# Patient Record
Sex: Male | Born: 1940 | Race: Black or African American | Hispanic: No | Marital: Married | Smoking: Former smoker
Health system: Southern US, Community
[De-identification: ages and names within clinical notes are randomized; demographics above are authoritative.]

## PROBLEM LIST (undated history)

## (undated) DIAGNOSIS — J40 Bronchitis, not specified as acute or chronic: Secondary | ICD-10-CM

## (undated) DIAGNOSIS — K5790 Diverticulosis of intestine, part unspecified, without perforation or abscess without bleeding: Secondary | ICD-10-CM

## (undated) DIAGNOSIS — E7800 Pure hypercholesterolemia, unspecified: Secondary | ICD-10-CM

## (undated) DIAGNOSIS — B1920 Unspecified viral hepatitis C without hepatic coma: Secondary | ICD-10-CM

## (undated) DIAGNOSIS — K635 Polyp of colon: Secondary | ICD-10-CM

## (undated) DIAGNOSIS — J449 Chronic obstructive pulmonary disease, unspecified: Secondary | ICD-10-CM

## (undated) DIAGNOSIS — I10 Essential (primary) hypertension: Secondary | ICD-10-CM

## (undated) DIAGNOSIS — E119 Type 2 diabetes mellitus without complications: Secondary | ICD-10-CM

## (undated) DIAGNOSIS — N321 Vesicointestinal fistula: Secondary | ICD-10-CM

## (undated) DIAGNOSIS — K219 Gastro-esophageal reflux disease without esophagitis: Secondary | ICD-10-CM

## (undated) HISTORY — PX: INCISION AND DRAINAGE OF WOUND: SHX1803

## (undated) HISTORY — DX: Unspecified viral hepatitis C without hepatic coma: B19.20

## (undated) HISTORY — DX: Essential (primary) hypertension: I10

## (undated) HISTORY — DX: Diverticulosis of intestine, part unspecified, without perforation or abscess without bleeding: K57.90

## (undated) HISTORY — PX: COLONOSCOPY: SHX174

## (undated) HISTORY — PX: TONSILLECTOMY: SUR1361

## (undated) HISTORY — DX: Vesicointestinal fistula: N32.1

## (undated) HISTORY — PX: POLYPECTOMY: SHX149

## (undated) HISTORY — DX: Polyp of colon: K63.5

## (undated) HISTORY — PX: LIVER BIOPSY: SHX301

## (undated) HISTORY — DX: Bronchitis, not specified as acute or chronic: J40

---

## 1969-04-15 HISTORY — PX: INCISION AND DRAINAGE OF WOUND: SHX1803

## 1998-09-29 ENCOUNTER — Ambulatory Visit (HOSPITAL_COMMUNITY): Admission: RE | Admit: 1998-09-29 | Discharge: 1998-09-29 | Payer: Self-pay | Admitting: Hematology and Oncology

## 1998-09-29 ENCOUNTER — Encounter: Admission: RE | Admit: 1998-09-29 | Discharge: 1998-09-29 | Payer: Self-pay | Admitting: Hematology and Oncology

## 1998-09-29 ENCOUNTER — Encounter: Payer: Self-pay | Admitting: Hematology and Oncology

## 1999-03-07 ENCOUNTER — Emergency Department (HOSPITAL_COMMUNITY): Admission: EM | Admit: 1999-03-07 | Discharge: 1999-03-07 | Payer: Self-pay

## 1999-03-17 ENCOUNTER — Encounter (INDEPENDENT_AMBULATORY_CARE_PROVIDER_SITE_OTHER): Payer: Self-pay | Admitting: Specialist

## 1999-03-17 ENCOUNTER — Ambulatory Visit (HOSPITAL_COMMUNITY): Admission: RE | Admit: 1999-03-17 | Discharge: 1999-03-17 | Payer: Self-pay | Admitting: Gastroenterology

## 2000-11-21 ENCOUNTER — Ambulatory Visit (HOSPITAL_COMMUNITY): Admission: RE | Admit: 2000-11-21 | Discharge: 2000-11-21 | Payer: Self-pay | Admitting: Internal Medicine

## 2000-11-21 ENCOUNTER — Encounter: Payer: Self-pay | Admitting: Internal Medicine

## 2000-11-29 ENCOUNTER — Ambulatory Visit (HOSPITAL_COMMUNITY): Admission: RE | Admit: 2000-11-29 | Discharge: 2000-11-29 | Payer: Self-pay | Admitting: Internal Medicine

## 2001-06-21 ENCOUNTER — Encounter: Payer: Self-pay | Admitting: Endocrinology

## 2001-06-21 ENCOUNTER — Ambulatory Visit (HOSPITAL_COMMUNITY): Admission: RE | Admit: 2001-06-21 | Discharge: 2001-06-21 | Payer: Self-pay | Admitting: Endocrinology

## 2002-05-23 ENCOUNTER — Encounter: Payer: Self-pay | Admitting: Internal Medicine

## 2002-05-23 ENCOUNTER — Encounter: Admission: RE | Admit: 2002-05-23 | Discharge: 2002-05-23 | Payer: Self-pay | Admitting: Internal Medicine

## 2002-05-24 ENCOUNTER — Encounter: Payer: Self-pay | Admitting: Internal Medicine

## 2002-05-24 ENCOUNTER — Encounter: Admission: RE | Admit: 2002-05-24 | Discharge: 2002-05-24 | Payer: Self-pay | Admitting: Internal Medicine

## 2005-04-04 ENCOUNTER — Emergency Department (HOSPITAL_COMMUNITY): Admission: EM | Admit: 2005-04-04 | Discharge: 2005-04-04 | Payer: Self-pay | Admitting: Emergency Medicine

## 2005-05-06 ENCOUNTER — Ambulatory Visit: Payer: Self-pay | Admitting: Family Medicine

## 2005-07-23 ENCOUNTER — Emergency Department (HOSPITAL_COMMUNITY): Admission: EM | Admit: 2005-07-23 | Discharge: 2005-07-24 | Payer: Self-pay | Admitting: Emergency Medicine

## 2006-01-02 ENCOUNTER — Encounter: Payer: Self-pay | Admitting: Emergency Medicine

## 2006-01-03 ENCOUNTER — Observation Stay (HOSPITAL_COMMUNITY): Admission: EM | Admit: 2006-01-03 | Discharge: 2006-01-04 | Payer: Self-pay | Admitting: Internal Medicine

## 2006-01-31 ENCOUNTER — Ambulatory Visit: Payer: Self-pay | Admitting: Sports Medicine

## 2007-04-29 ENCOUNTER — Emergency Department (HOSPITAL_COMMUNITY): Admission: EM | Admit: 2007-04-29 | Discharge: 2007-04-30 | Payer: Self-pay | Admitting: Emergency Medicine

## 2007-12-10 ENCOUNTER — Ambulatory Visit: Payer: Self-pay | Admitting: Family Medicine

## 2007-12-10 DIAGNOSIS — F17200 Nicotine dependence, unspecified, uncomplicated: Secondary | ICD-10-CM

## 2007-12-10 DIAGNOSIS — G5710 Meralgia paresthetica, unspecified lower limb: Secondary | ICD-10-CM

## 2007-12-11 ENCOUNTER — Encounter (INDEPENDENT_AMBULATORY_CARE_PROVIDER_SITE_OTHER): Payer: Self-pay | Admitting: Family Medicine

## 2008-05-17 ENCOUNTER — Emergency Department (HOSPITAL_COMMUNITY): Admission: EM | Admit: 2008-05-17 | Discharge: 2008-05-17 | Payer: Self-pay | Admitting: Emergency Medicine

## 2008-05-20 ENCOUNTER — Ambulatory Visit (HOSPITAL_COMMUNITY): Admission: RE | Admit: 2008-05-20 | Discharge: 2008-05-20 | Payer: Self-pay | Admitting: Family Medicine

## 2010-06-25 ENCOUNTER — Encounter: Admission: RE | Admit: 2010-06-25 | Discharge: 2010-06-25 | Payer: Self-pay | Admitting: Emergency Medicine

## 2010-12-31 NOTE — H&P (Signed)
NAMEGUERIN, LASHOMB NO.:  [id number]   MEDICAL RECORD NO.:  [id number]          PATIENT TYPE:  EMS   LOCATION:  ED                           FACILITY:  [facility]   PHYSICIAN:  Hollice Espy, M.D.DATE OF BIRTH:  1941-08-03   DATE OF ADMISSION:  01/02/2006  DATE OF DISCHARGE:                                HISTORY & PHYSICAL   ATTENDING PHYSICIAN:  Deirdre Peer. Polite, M.D.  The patient has no primary  care physician.   CHIEF COMPLAINT:  Chest pain.   HISTORY OF PRESENT ILLNESS:  The patient is a 70 year old African American  male with no past medical history but has not really ever seen a physician  who presents to the emergency room after an episode of chest pain.  He,  according to his wife, has had previous episodes of chest pain and  increasing dyspnea on exertion but has not really talked to a doctor about  this.  Today he was driving in his truck and all of a sudden started having  left-sided chest pain.  He described it as a heavy pressure that radiated  from the right side of his chest to the left side of his chest.  There was  also some radiation up to his jaw.  There was no associated shortness of  breath. His symptoms lasted for about an hour and started to wane on their  own.  By the time he came into the emergency room, it had nearly resolved.  EKG was done which showed essentially normal sinus rhythm with some vague  nonspecific T wave abnormalities with flipped T waves in leads III and aVF  and flattened in lead II.  Cardiac enzymes were drawn and found to be  unremarkable.  The rest of the patient's labs were unremarkable as well.  Currently the patient is feeling okay.  He has no complaints.  He denies any  headaches, vision changes, chest pain, palpitations, shortness of breath,  wheeze, cough, abdominal pain, hematuria, dysuria, constipation, diarrhea,  focal extremity numbness, weakness or pain.  His review of systems is  otherwise  negative.   PAST MEDICAL HISTORY:  None but again, he has not seen a physician.   MEDICATIONS:  None.   ALLERGIES:  None.   SOCIAL HISTORY:  He smokes a pack a day.  Denies any drugs or alcohol use.   FAMILY HISTORY:  Noted for a mom who had an MI in her 60's.   PHYSICAL EXAMINATION:  VITAL SIGNS:  Temperature 99.7, heart rate 81, blood  pressure 136/88, respirations, 28, saturation 99% on room air.  GENERAL:  The patient is alert and oriented x3, in no apparent distress.  HEENT:  Normocephalic, atraumatic.  Mucous membranes are moist.  He has no  carotid bruits.  HEART:  Regular rate and rhythm.  S1 and S2.  LUNGS:  Clear to auscultation bilaterally.  ABDOMEN:  Soft, nontender, nondistended.  Positive bowel sounds.  EXTREMITIES:  Note that he does have a 1+ pitting edema and the patient  tells me that right now he is pretty benign since he has  been lying on the  bed.  He is usually much worse by the end of the day.  This also has been  going on for the last year to year-and-a-half.   LABORATORY DATA:  BNP less than 30.  Sodium 139, potassium 4.5, chloride  102, bicarb 27, BUN 13, creatinine 1.1, glucose 118, calcium 10.  White  count 7.9, hemoglobin 17.2, hematocrit 51.1, MCV 94.  Platelet count is  noted to be clumped on exam so unable to determine.  Cardiac markers:  CPK 91.5, MB 1.5, troponin less than 0.05.   ASSESSMENT/PLAN:  1.  Atypical chest pain in a patient who has not seen a physician.  The      patient does have risk factors including the tobacco use, unknown      cholesterol levels and family history.  Will check a second set of      cardiac enzymes at 6 a.m. followed by a third set at 12 noon.  Will ask      cardiology to see and will also check a fasting lipid profile in the      morning.  2.  Tobacco abuse.  Provide tobacco counseling.  3.  Lower extremity swelling, may be from undiagnosed hypertension.      However, currently the patient's blood pressure is  not too bad.  Will      check a 2-D echo as well.      Hollice Espy, M.D.  Electronically Signed     SKK/MEDQ  D:  01/03/2006  T:  01/03/2006  Job:  [id number]

## 2011-01-06 ENCOUNTER — Other Ambulatory Visit: Payer: Self-pay | Admitting: Emergency Medicine

## 2011-01-17 ENCOUNTER — Ambulatory Visit
Admission: RE | Admit: 2011-01-17 | Discharge: 2011-01-17 | Disposition: A | Discharge status: Home / Self Care | Payer: Medicare Other | Source: Ambulatory Visit | Attending: Emergency Medicine | Admitting: Emergency Medicine

## 2011-02-14 ENCOUNTER — Encounter (INDEPENDENT_AMBULATORY_CARE_PROVIDER_SITE_OTHER): Payer: Self-pay | Admitting: Surgery

## 2011-02-14 ENCOUNTER — Ambulatory Visit (INDEPENDENT_AMBULATORY_CARE_PROVIDER_SITE_OTHER): Payer: Medicare Other | Admitting: Surgery

## 2011-02-14 DIAGNOSIS — B1920 Unspecified viral hepatitis C without hepatic coma: Secondary | ICD-10-CM

## 2011-02-14 DIAGNOSIS — K8020 Calculus of gallbladder without cholecystitis without obstruction: Secondary | ICD-10-CM

## 2011-02-14 DIAGNOSIS — N139 Obstructive and reflux uropathy, unspecified: Secondary | ICD-10-CM | POA: Insufficient documentation

## 2011-02-14 DIAGNOSIS — B182 Chronic viral hepatitis C: Secondary | ICD-10-CM | POA: Insufficient documentation

## 2011-02-14 NOTE — Progress Notes (Signed)
Subjective:     Patient ID: Phillip Maynard., male   DOB: May 01, 1941, 70 y.o.   MRN: [id number]    BP 136/84  Pulse 66  Temp 97.1 F (36.2 C)  Ht 5\' 9"  (1.753 m)  Wt 205 lb 9.6 oz (93.26 kg)  BMI 30.36 kg/m2    HPI The patient presents due to acute abdominal pain. It has been present for one month. It is located just below the bellybutton. It was sharp in nature. It was an 8/10. It got better with urination. He has difficulty urinating. He denies any nausea or vomiting. He denies any right upper quadrant pain. He denies any epigastric pain or back pain. The pain is not worse with eating. He denies any difficulty with his diet. He is sent today at the request of urgent care. He had an ultrasound done which shows gallstones. He does not have any signs of cirrhosis on ultrasound.   Review of Systems  Constitutional: Positive for appetite change and fatigue. Negative for unexpected weight change.  HENT: Negative.   Eyes: Negative.   Respiratory: Negative.   Cardiovascular: Negative.   Gastrointestinal: Positive for abdominal pain. Negative for nausea, diarrhea, abdominal distention and rectal pain.  Genitourinary: Positive for frequency, decreased urine volume and difficulty urinating.  Musculoskeletal: Negative.   Skin: Negative.   Neurological: Negative.   Hematological: Negative.   Psychiatric/Behavioral: Negative.        Objective:   Physical Exam  Constitutional: He appears well-developed.  HENT:  Head: Normocephalic and atraumatic.  Nose: Nose normal.  Neck: Normal range of motion. Neck supple.  Cardiovascular: Normal rate, regular rhythm, normal heart sounds and intact distal pulses.  Exam reveals no gallop and no friction rub.   No murmur heard. Pulmonary/Chest: Effort normal and breath sounds normal. He has no wheezes.  Abdominal: Soft. Bowel sounds are normal. He exhibits no mass. There is no tenderness. There is no rebound and no guarding.  Musculoskeletal: Normal  range of motion.  Neurological: He is alert.  Skin: Skin is warm and dry.  Psychiatric: He has a normal mood and affect. His behavior is normal. Judgment and thought content normal.       Assessment:  Cholelithiasis  Hepatitis C  Urinary retention  Tobacco abuse Plan:  I have reviewed his ultrasound shows gallstones. His common bile duct is normal. There is no sonographic evidence of cirrhosis or ascites. She relates no symptoms associated with gallstone disease. His symptoms are consistent with urinary retention. Asymptomatic gallstones are observed. I told him he began to develop more upper bowel pain, nausea, and vomiting that he would be reevaluated for laparoscopic cholecystectomy. I will refer him to urology for evaluation of his urinary tract problems.

## 2011-02-14 NOTE — Patient Instructions (Signed)
You have gallstones that are asymptomatic.  These do not require surgery unless they become symptomatic. Her symptoms are suggestive of urinary tract outflow obstruction. Her pain is just above your pubic bone. They have also been treated for a urinary tract infection. He gives a history of difficulty voiding. These are unrelated to gallstone disease. I will refer you to a urologist for further evaluation.

## 2011-03-26 LAB — HM DIABETES EYE EXAM

## 2011-05-27 LAB — CBC
Hemoglobin: 15.5
MCHC: 34.6
MCV: 89.7
RBC: 5

## 2011-05-27 LAB — BASIC METABOLIC PANEL
CO2: 27
Chloride: 104
GFR calc Af Amer: >60
Potassium: 3.8
Sodium: 138

## 2011-05-27 LAB — POCT CARDIAC MARKERS: Myoglobin, poc: 61.5

## 2011-10-30 ENCOUNTER — Ambulatory Visit (INDEPENDENT_AMBULATORY_CARE_PROVIDER_SITE_OTHER): Payer: Medicare Other | Admitting: Emergency Medicine

## 2011-10-30 VITALS — BP 152/62 | HR 62 | Temp 98.4°F | Resp 16 | Ht 67.58 in | Wt 197.4 lb

## 2011-10-30 DIAGNOSIS — R200 Anesthesia of skin: Secondary | ICD-10-CM

## 2011-10-30 DIAGNOSIS — R251 Tremor, unspecified: Secondary | ICD-10-CM

## 2011-10-30 DIAGNOSIS — R7309 Other abnormal glucose: Secondary | ICD-10-CM

## 2011-10-30 DIAGNOSIS — R209 Unspecified disturbances of skin sensation: Secondary | ICD-10-CM

## 2011-10-30 DIAGNOSIS — B182 Chronic viral hepatitis C: Secondary | ICD-10-CM

## 2011-10-30 DIAGNOSIS — I10 Essential (primary) hypertension: Secondary | ICD-10-CM

## 2011-10-30 DIAGNOSIS — R259 Unspecified abnormal involuntary movements: Secondary | ICD-10-CM

## 2011-10-30 DIAGNOSIS — R739 Hyperglycemia, unspecified: Secondary | ICD-10-CM

## 2011-10-30 LAB — POCT CBC
Granulocyte percent: 42.5 %G (ref 37–80)
HCT, POC: 47.4 % (ref 43.5–53.7)
Lymph, poc: 3.7 — AB (ref 0.6–3.4)
MCV: 92.1 fL (ref 80–97)
POC LYMPH PERCENT: 47 %L (ref 10–50)
RDW, POC: 14.1 %

## 2011-10-30 LAB — POCT GLYCOSYLATED HEMOGLOBIN (HGB A1C): Hemoglobin A1C: 6.7

## 2011-10-30 LAB — GLUCOSE, POCT (MANUAL RESULT ENTRY): POC Glucose: 68

## 2011-10-30 MED ORDER — LOSARTAN POTASSIUM-HCTZ 100-12.5 MG PO TABS: 1.0000 | ORAL_TABLET | Freq: Every day | ORAL | Status: DC

## 2011-10-30 NOTE — Patient Instructions (Addendum)
Meralgia Paresthetica  Meralgia paresthetica (MP) is a disorder characterized by tingling, numbness, and burning pain in the outer side of the thigh. It occurs in men more than women. MP is generally found in middle-aged or overweight people. Sometimes, the disorder may disappear. CAUSES The disorder is caused by a nerve in the thigh being squeezed (compressed). MP may be associated with tight clothing, pregnancy, diabetes, and being overweight (obese). SYMPTOMS  Tingling, numbness, and burning in the outer thigh.   An area of the skin may be painful and sensitive to the touch.  The symptoms often worsen after walking or standing. TREATMENT  Treatment is based on your symptoms and is mainly supportive. Treatment may include:  Wearing looser clothing.   Losing weight.   Avoiding prolonged standing or walking.   Taking medication.   Surgery if the pain is peristent or severe.  MP usually eases or disappears after treatment. Surgery is not always fully successful. Document Released: 07/22/2002 Document Revised: 07/21/2011 Document Reviewed: 08/01/2005 [facility] Patient Information 2012 [place], [place].Diabetes, Frequently Asked Questions WHAT IS DIABETES? Most of the food we eat is turned into glucose (sugar). Our bodies use it for energy. The pancreas makes a hormone called insulin. It helps glucose get into the cells of our bodies. When you have diabetes, your body either does not make enough insulin or cannot use its own insulin as well as it should. This causes sugars to build up in your blood. WHAT ARE THE SYMPTOMS OF DIABETES?  Frequent urination.   Excessive thirst.   Unexplained weight loss.   Extreme hunger.   Blurred vision.   Tingling or numbness in hands or feet.   Feeling very tired much of the time.   Dry, itchy skin.   Sores that are slow to heal.   Yeast infections.  WHAT ARE THE TYPES OF DIABETES? Type 1 Diabetes   About 10% of affected people have this  type.   Usually occurs before the age of 34.   Usually occurs in thin to normal weight people.  Type 2 Diabetes  About 90% of affected people have this type.   Usually occurs after the age of 61.   Usually occurs in overweight people.   More likely to have:   A family history of diabetes.   A history of diabetes during pregnancy (gestational diabetes).   High blood pressure.   High cholesterol and triglycerides.  Gestational Diabetes  Occurs in about 4% of pregnancies.   Usually goes away after the baby is born.   More likely to occur in women with:   Family history of diabetes.   Previous gestational diabetes.   Obese.   Over 10 years old.  WHAT IS PRE-DIABETES? Pre-diabetes means your blood glucose is higher than normal, but lower than the diabetes range. It also means you are at risk of getting type 2 diabetes and heart disease. If you are told you have pre-diabetes, have your blood glucose checked again in 1 to 2 years. WHAT IS THE TREATMENT FOR DIABETES? Treatment is aimed at keeping blood glucose near normal levels at all times. Learning how to manage this yourself is important in treating diabetes. Depending on the type of diabetes you have, your treatment will include one or more of the following:  Monitoring your blood glucose.   Meal planning.   Exercise.   Oral medicine (pills) or insulin.  CAN DIABETES BE PREVENTED? With type 1 diabetes, prevention is more difficult, because the triggers that cause  it are not yet known. With type 2 diabetes, prevention is more likely, with lifestyle changes:  Maintain a healthy weight.   Eat healthy.   Exercise.  IS THERE A CURE FOR DIABETES? No, there is no cure for diabetes. There is a lot of research going on that is looking for a cure, and progress is being made. Diabetes can be treated and controlled. People with diabetes can manage their diabetes and lead normal, active lives. SHOULD I BE TESTED FOR  DIABETES? If you are at least 69 years old, you should be tested for diabetes. You should be tested again every 3 years. If you are 45 or older and overweight, you may want to get tested more often. If you are younger than 45, overweight, and have one or more of the following risk factors, you should be tested:  Family history of diabetes.   Inactive lifestyle.   High blood pressure.  WHAT ARE SOME OTHER SOURCES FOR INFORMATION ON DIABETES? The following organizations may help in your search for more information on diabetes: National Diabetes Education Program (NDEP) Internet: [employer]Discuss[employer] American Diabetes Association Internet: http://www.diabetes.org  Juvenile Diabetes Foundation International Internet: [employer] Document Released: 08/04/2003 Document Revised: 07/21/2011 Document Reviewed: 05/29/2009 [facility] Patient Information 2012 [place], [place].

## 2011-10-30 NOTE — Progress Notes (Signed)
  Subjective:    Patient ID: Phillip Maynard., male    DOB: [birth year], 71 y.o.   MRN: [id number]  HPI patient comes in to recheck on his blood pressure. On his last set of blood tests he was found to have an elevated glucose. Since his last visit here he has been to Dr. Jacinto Halim and undergone stress testing.    Review of Systems  Constitutional: Negative.   HENT: Negative.   Eyes: Negative.   Respiratory: Negative.   Cardiovascular:       Patient has been to Dr. Jacinto Halim. and undergone stress testing.  Gastrointestinal:       Patient under treatment for hep C  Genitourinary: Negative.   Neurological: Positive for tremors and numbness.       Patient is complaining of numbness on the top of his right leg. He also has had a tremor. This has not interfered with his daily activity.  Hematological: Negative.   Psychiatric/Behavioral: Negative.        Objective:   Physical Exam  Constitutional: He is oriented to person, place, and time. He appears well-developed and well-nourished.  HENT:  Head: Normocephalic.  Eyes: Pupils are equal, round, and reactive to light.  Neck: No JVD present. No tracheal deviation present. No thyromegaly present.  Cardiovascular: Normal rate and regular rhythm.  Exam reveals no gallop and no friction rub.   No murmur heard. Pulmonary/Chest: Breath sounds normal.  Abdominal: Soft. There is no tenderness. There is no rebound.  Lymphadenopathy:    He has no cervical adenopathy.  Neurological: He is alert and oriented to person, place, and time. He has normal reflexes. He displays normal reflexes. No cranial nerve deficit. He exhibits normal muscle tone. Coordination normal.       No tremor was noted on exam. There is decreased sensation over the anterior lateral right thigh          Assessment & Plan:   Assessment is hypertension and hyperglycemia. Patient also has hypertension. Patient has hepatitis C and is due for a checkup tomorrow to see what his  viral load is. 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

## 2012-01-20 ENCOUNTER — Telehealth: Payer: Self-pay

## 2012-01-20 ENCOUNTER — Ambulatory Visit (INDEPENDENT_AMBULATORY_CARE_PROVIDER_SITE_OTHER): Payer: Medicare Other | Admitting: Emergency Medicine

## 2012-01-20 VITALS — BP 125/74 | HR 58 | Temp 98.7°F | Resp 16 | Ht 67.75 in | Wt 193.4 lb

## 2012-01-20 DIAGNOSIS — Z1211 Encounter for screening for malignant neoplasm of colon: Secondary | ICD-10-CM

## 2012-01-20 DIAGNOSIS — N529 Male erectile dysfunction, unspecified: Secondary | ICD-10-CM

## 2012-01-20 DIAGNOSIS — Z139 Encounter for screening, unspecified: Secondary | ICD-10-CM

## 2012-01-20 DIAGNOSIS — E119 Type 2 diabetes mellitus without complications: Secondary | ICD-10-CM

## 2012-01-20 LAB — POCT GLYCOSYLATED HEMOGLOBIN (HGB A1C): Hemoglobin A1C: 7

## 2012-01-20 MED ORDER — METFORMIN HCL 500 MG PO TABS: 500.0000 mg | ORAL_TABLET | Freq: Two times a day (BID) | ORAL | Status: DC

## 2012-01-20 MED ORDER — TADALAFIL 5 MG PO TABS: 5.0000 mg | ORAL_TABLET | Freq: Every day | ORAL | Status: DC | PRN

## 2012-01-20 NOTE — Patient Instructions (Signed)

## 2012-01-20 NOTE — Progress Notes (Signed)
  Subjective:    Patient ID: Phillip Maynard., male    DOB: 04/02/1941, 71 y.o.   MRN: [id number]  HPI patient had a followup on his diabetes. He has a history of hepatitis but does recover from this and is no longer requiring treatment. He's not been on meds.Marland Kitchen He is due for a repeat colonoscopy which he has not had performed yet.    Review of Systems patient feels well with no complaints at the present time.     Objective:   Physical Exam HEENT exam is unremarkable. Neck is supple. Chest is clear. Heart regular rate no murmurs. Abdomen soft no tenderness  Results for orders placed in visit on 01/20/12  GLUCOSE, POCT (MANUAL RESULT ENTRY)      Component Value Range   POC Glucose 88  70 - 99 (mg/dl)   Results for orders placed in visit on 01/20/12  GLUCOSE, POCT (MANUAL RESULT ENTRY)      Component Value Range   POC Glucose 88  70 - 99 (mg/dl)  POCT GLYCOSYLATED HEMOGLOBIN (HGB A1C)      Component Value Range   Hemoglobin A1C 7.0        Assessment & Plan:  We'll do a followup glucose hemoglobin A1c and cmet. He'll make his own appointment to see a GI specialist at [facility]. He was given the name and number to call.

## 2012-01-20 NOTE — Telephone Encounter (Signed)
.  umfc The patient's wife called regarding the patient.  The patient's wife stated that the patient was advised to call [facility] for a colonoscopy, but when he called, they stated he needed referral for procedure.  They gave the patient a number to give to [facility] for [place] to schedule the procedure.  Please clarify with patient and send to referrals if colonoscopy is ordered.

## 2012-01-21 LAB — COMPREHENSIVE METABOLIC PANEL
ALT: 16 U/L (ref 0–53)
Albumin: 4 g/dL (ref 3.5–5.2)
CO2: 28 mEq/L (ref 19–32)
Calcium: 9.1 mg/dL (ref 8.4–10.5)
Chloride: 102 mEq/L (ref 96–112)
Creat: 0.94 mg/dL (ref 0.50–1.35)
Sodium: 138 mEq/L (ref 135–145)
Total Protein: 7.1 g/dL (ref 6.0–8.3)

## 2012-01-21 NOTE — Telephone Encounter (Signed)
Dr. Cleta Alberts,  Is it ok for [place] to refer?  Was it a GI consult or colonoscopy?

## 2012-01-23 NOTE — Telephone Encounter (Signed)
Dr. Cleta Alberts,  I think I ordered the colonoscopy correctly, but it won't let me sign it.Marland KitchenMarland Kitchen

## 2012-01-23 NOTE — Telephone Encounter (Signed)
Patient is to be evaluated for repeat colonoscopy. Go ahead and make referral to his GI specialist in [facility]

## 2012-01-26 ENCOUNTER — Other Ambulatory Visit: Payer: Self-pay | Admitting: Emergency Medicine

## 2012-01-26 NOTE — Telephone Encounter (Signed)
LMOM letting patient know status.

## 2012-08-10 ENCOUNTER — Other Ambulatory Visit: Payer: Self-pay | Admitting: Emergency Medicine

## 2012-11-01 ENCOUNTER — Ambulatory Visit (INDEPENDENT_AMBULATORY_CARE_PROVIDER_SITE_OTHER): Payer: Medicare Other | Admitting: Family Medicine

## 2012-11-01 VITALS — BP 138/78 | HR 61 | Temp 98.2°F | Resp 16 | Ht 67.0 in | Wt 192.0 lb

## 2012-11-01 DIAGNOSIS — N529 Male erectile dysfunction, unspecified: Secondary | ICD-10-CM

## 2012-11-01 DIAGNOSIS — I10 Essential (primary) hypertension: Secondary | ICD-10-CM

## 2012-11-01 DIAGNOSIS — E119 Type 2 diabetes mellitus without complications: Secondary | ICD-10-CM

## 2012-11-01 LAB — GLUCOSE, POCT (MANUAL RESULT ENTRY): POC Glucose: 91 mg/dl (ref 70–99)

## 2012-11-01 MED ORDER — LOSARTAN POTASSIUM-HCTZ 100-12.5 MG PO TABS: 1.0000 | ORAL_TABLET | Freq: Every day | ORAL | Status: DC

## 2012-11-01 MED ORDER — TADALAFIL 2.5 MG PO TABS: 2.5000 mg | ORAL_TABLET | Freq: Every day | ORAL | Status: DC | PRN

## 2012-11-01 NOTE — Progress Notes (Signed)
Subjective:    Patient ID: Phillip Maynard., male    DOB: [birth year], 72 y.o.   MRN: [id number]  HPI Phillip Surgeon. is a 72 y.o. male Here for med refills.  Last ov with Dr. Cleta Alberts 01/20/12. PCP - Dr. Cleta Alberts? oir myself.   DM2 - outside blood sugars  128-130, but going up to 140 with weight gain.  Exercises most days, but less walking - only 10 minutes.  Came off diet. Last took metformin few months ago.  Ran out and didn't get back in to office.   HTN- outside bp's - under 140/70's. Last blood pressure medicine yesterday. No new side effects.   Results for orders placed in visit on 01/20/12  COMPREHENSIVE METABOLIC PANEL      Result Value Range   Sodium 138  135 - 145 mEq/L   Potassium 3.8  3.5 - 5.3 mEq/L   Chloride 102  96 - 112 mEq/L   CO2 28  19 - 32 mEq/L   Glucose, Bld 95  70 - 99 mg/dL   BUN 13  6 - 23 mg/dL   Creat 6.96  2.95 - 2.84 mg/dL   Total Bilirubin 0.5  0.3 - 1.2 mg/dL   Alkaline Phosphatase 45  39 - 117 U/L   AST 18  0 - 37 U/L   ALT 16  0 - 53 U/L   Total Protein 7.1  6.0 - 8.3 g/dL   Albumin 4.0  3.5 - 5.2 g/dL   Calcium 9.1  8.4 - 13.2 mg/dL  GLUCOSE, POCT (MANUAL RESULT ENTRY)      Result Value Range   POC Glucose 88  70 - 99 mg/dl  POCT GLYCOSYLATED HEMOGLOBIN (HGB A1C)      Result Value Range   Hemoglobin A1C 7.0      ED - has voucher for Cialis 2.5mg  qd.  once every now and then - 2 times per month. Helps with symptoms. No side effects.   Hx of Hep C, referred to GI at [facility]? Last year and for repeat colonoscopy. Had colonoscopy last year.  Plan to repeat in 5 years.    Not fasting this afternoon. Last ate 4 hours ago.   Stress test last year.   Review of Systems  Constitutional: Negative for fatigue and unexpected weight change.  Eyes: Negative for visual disturbance.  Respiratory: Negative for cough, chest tightness and shortness of breath.   Cardiovascular: Negative for chest pain, palpitations and leg swelling.   Gastrointestinal: Negative for abdominal pain and blood in stool.  Neurological: Negative for dizziness, light-headedness and headaches.       Objective:   Physical Exam  Constitutional: He is oriented to person, place, and time. He appears well-developed and well-nourished.  HENT:  Head: Normocephalic and atraumatic.  Eyes: Pupils are equal, round, and reactive to light.  Cardiovascular: Normal rate, regular rhythm, normal heart sounds and intact distal pulses.   Pulmonary/Chest: Effort normal and breath sounds normal.  Abdominal: Soft. There is no tenderness.  Neurological: He is alert and oriented to person, place, and time.  Microfilament testing of feet normal bilaterally.  Skin: Skin is warm, dry and intact. No rash noted.  Psychiatric: He has a normal mood and affect. His behavior is normal.   Results for orders placed in visit on 11/01/12  GLUCOSE, POCT (MANUAL RESULT ENTRY)      Result Value Range   POC Glucose 91  70 - 99 mg/dl  POCT GLYCOSYLATED  HEMOGLOBIN (HGB A1C)      Result Value Range   Hemoglobin A1C 6.4         Assessment & Plan:  Phillip Maynard. is a 72 y.o. male Diabetes mellitus, type 2 - Plan: POCT glucose (manual entry), POCT glycosylated hemoglobin (Hb A1C)  Unspecified essential hypertension - Plan: POCT glucose (manual entry), POCT glycosylated hemoglobin (Hb A1C), Basic metabolic panel  ED (erectile dysfunction) - Plan: Tadalafil (CIALIS) 2.5 MG TABS  Hypertension - Plan: losartan-hydrochlorothiazide (HYZAAR) 100-12.5 MG per tablet    DM2 -  Control appears good off meds past 3 months. Will try diet control with checking home cbg's and recheck in next 3 months.  Will discussed need for more routine follow up on diabetes and follow up when taking meds for more accurate assessment and treatment.  Understanding expressed. Work on diet/exercise. Recheck in 3 months - will assist in compliance by scheduling appt. Advised fasting for that visit for  risk stratification - especially with HTN, tobacco abuse, and DM - discussed smoking cessation briefly, but plan on further discussion at next ov including Chantix and his concerns with this.   HTN - borderline control.  Diet changes and increase walking.  meds refilled.   ED - treated with Cialis 5mg  in past - would like to try 2.5mg  - #30 given, but needs ov to discuss this dose and physical in next 6 months.   GI - s/p colonoscopy last year and now followed by [facility] GI by report.  Records requested as not seen in [facility].    Patient Instructions  Work on diet and walking as discussed.  We can stop metformin for now. Recheck in 3 months for diabetes, and make sure you see [place] before medicines run out in the future. Keep a record of your blood pressures and blood sugars outside of the office and bring them to the next office visit.  You should also schedule a physical in the next 6 months. Return to the clinic or go to the nearest emergency room if any of your symptoms worsen or new symptoms occur. You can have fasting labwork drawn the morning of your scheduled appointment so we can look at your cholesterol level. Your should receive a call or letter about your lab results within the next week to 10 days.       Meds ordered this encounter  Medications  . Tadalafil (CIALIS) 2.5 MG TABS    Sig: Take 1 tablet (2.5 mg total) by mouth daily as needed for erectile dysfunction.    Dispense:  30 tablet    Refill:  1  . losartan-hydrochlorothiazide (HYZAAR) 100-12.5 MG per tablet    Sig: Take 1 tablet by mouth daily.    Dispense:  90 tablet    Refill:  1

## 2012-11-01 NOTE — Patient Instructions (Addendum)
Work on diet and walking as discussed.  We can stop metformin for now. Recheck in 3 months for diabetes, and make sure you see [place] before medicines run out in the future. Keep a record of your blood pressures and blood sugars outside of the office and bring them to the next office visit.  You should also schedule a physical in the next 6 months. Return to the clinic or go to the nearest emergency room if any of your symptoms worsen or new symptoms occur. You can have fasting labwork drawn the morning of your scheduled appointment so we can look at your cholesterol level. Your should receive a call or letter about your lab results within the next week to 10 days.

## 2012-11-02 LAB — BASIC METABOLIC PANEL
BUN: 15 mg/dL (ref 6–23)
Calcium: 9.5 mg/dL (ref 8.4–10.5)
Creat: 1.1 mg/dL (ref 0.50–1.35)

## 2012-11-05 NOTE — Progress Notes (Signed)
Left msg for pt to schedule 3 month f-up with Drs. Daub or Neva Seat.

## 2012-11-06 NOTE — Progress Notes (Signed)
Pt made appt with Dr. Neva Seat for 12/24/12.

## 2012-12-24 ENCOUNTER — Ambulatory Visit: Payer: Medicare Other | Admitting: Family Medicine

## 2013-01-28 ENCOUNTER — Ambulatory Visit (INDEPENDENT_AMBULATORY_CARE_PROVIDER_SITE_OTHER): Payer: Medicare Other | Admitting: Family Medicine

## 2013-01-28 ENCOUNTER — Encounter: Payer: Self-pay | Admitting: Family Medicine

## 2013-01-28 VITALS — BP 126/66 | HR 60 | Temp 98.4°F | Resp 18 | Ht 67.5 in | Wt 192.2 lb

## 2013-01-28 DIAGNOSIS — Z720 Tobacco use: Secondary | ICD-10-CM

## 2013-01-28 DIAGNOSIS — E119 Type 2 diabetes mellitus without complications: Secondary | ICD-10-CM

## 2013-01-28 DIAGNOSIS — F17200 Nicotine dependence, unspecified, uncomplicated: Secondary | ICD-10-CM

## 2013-01-28 DIAGNOSIS — I10 Essential (primary) hypertension: Secondary | ICD-10-CM

## 2013-01-28 LAB — LIPID PANEL
HDL: 39 mg/dL — ABNORMAL LOW (ref >39)
LDL Cholesterol: 97 mg/dL (ref 0–99)
Triglycerides: 114 mg/dL (ref <150)
VLDL: 23 mg/dL (ref 0–40)

## 2013-01-28 LAB — COMPREHENSIVE METABOLIC PANEL
ALT: 15 U/L (ref 0–53)
AST: 16 U/L (ref 0–37)
Creat: 1.15 mg/dL (ref 0.50–1.35)
Total Bilirubin: 0.6 mg/dL (ref 0.3–1.2)

## 2013-01-28 LAB — GLUCOSE, POCT (MANUAL RESULT ENTRY): POC Glucose: 116 mg/dl — AB (ref 70–99)

## 2013-01-28 LAB — POCT GLYCOSYLATED HEMOGLOBIN (HGB A1C): Hemoglobin A1C: 6.4

## 2013-01-28 MED ORDER — LOSARTAN POTASSIUM-HCTZ 100-12.5 MG PO TABS: 1.0000 | ORAL_TABLET | Freq: Every day | ORAL | Status: DC

## 2013-01-28 NOTE — Progress Notes (Signed)
Subjective:    Patient ID: Phillip Oliphant., male    DOB: 03/22/1941, 72 y.o.   MRN: [id number]  HPI Phillip Busby. is a 72 y.o. male Last ov 11/01/12:  DM2 -  off meds past 3 months at that ov. Weight same today at 192.  Results for orders placed in visit on 11/01/12  BASIC METABOLIC PANEL      Result Value Range   Sodium 141  135 - 145 mEq/L   Potassium 4.0  3.5 - 5.3 mEq/L   Chloride 104  96 - 112 mEq/L   CO2 30  19 - 32 mEq/L   Glucose, Bld 92  70 - 99 mg/dL   BUN 15  6 - 23 mg/dL   Creat 7.82  9.56 - 2.13 mg/dL   Calcium 9.5  8.4 - 08.6 mg/dL  GLUCOSE, POCT (MANUAL RESULT ENTRY)      Result Value Range   POC Glucose 91  70 - 99 mg/dl  POCT GLYCOSYLATED HEMOGLOBIN (HGB A1C)      Result Value Range   Hemoglobin A1C 6.4    Tried diet control with checking home cbg's, work on diet/exercise.  Not doing much walking. Does have treadmill. Fasting labs drawn this am. Not checking home blood sugars.  HTN - borderline control last ov.  Diet changes and increase walking discussed last ov - has not started walking, but has cut back on bad foods - better diet. Home blood pressures: 130/80's.taking BP med QD. No new side effects.   ED - treated with Cialis 5mg  in past - trial of  2.5mg  - #30 given last ov.  Planning on CPE in next 3 months. This dose has worked well.   Tobacco abuse - cut back - now on 4 packs in 7 days. Trying to cut back.   GI - followed by [facility] for hx of Hep C. No active sx's - no meds currently.   Here with wife - concern of prior abnormal CT and need for follow up. No new cough, no hemoptysis, no unexplained weight loss.    Review of Systems  Constitutional: Negative for fatigue and unexpected weight change.  Eyes: Negative for visual disturbance.  Respiratory: Negative for cough, chest tightness and shortness of breath.        No new cough, no hemoptysis.   Cardiovascular: Negative for chest pain, palpitations and leg swelling.   Gastrointestinal: Negative for abdominal pain and blood in stool.  Neurological: Negative for dizziness, light-headedness and headaches.       Objective:   Physical Exam  Vitals reviewed. Constitutional: He is oriented to person, place, and time. He appears well-developed and well-nourished.  HENT:  Head: Normocephalic and atraumatic.  Eyes: Pupils are equal, round, and reactive to light.  Cardiovascular: Normal rate, regular rhythm, normal heart sounds and intact distal pulses.   Pulmonary/Chest: Effort normal and breath sounds normal.  Abdominal: Soft. There is no tenderness.  Neurological: He is alert and oriented to person, place, and time.  Microfilament testing of feet normal bilaterally.  Skin: Skin is warm, dry and intact. No rash noted.  Psychiatric: He has a normal mood and affect. His behavior is normal.   Results for orders placed in visit on 01/28/13  GLUCOSE, POCT (MANUAL RESULT ENTRY)      Result Value Range   POC Glucose 116 (*) 70 - 99 mg/dl  POCT GLYCOSYLATED HEMOGLOBIN (HGB A1C)      Result Value Range  Hemoglobin A1C 6.4         Assessment & Plan:  Phillip Ambrosini. is a 72 y.o. male Type II or unspecified type diabetes mellitus without mention of complication, not stated as uncontrolled - Plan: POCT glucose (manual entry), POCT glycosylated hemoglobin (Hb A1C). Stable off meds, but again discussed importance of diet and exercise.   Essential hypertension, benign - Plan: Comprehensive metabolic panel, Lipid panel. Controlled, no change in meds. Refilled Hyzaar for 6 months.   Tobacco abuse  - discussed importance of cessation. Discussed [facility] for tobacco cessation clinic/options, as resistant to Chantix. Can discuss his concerns further as needed once he looks into different options.   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  Discussed possible abnormal CT in past - will pull paper chart to further  investigate this, and need for repeat imaging.  Denied any new cough or other concerning sx's. rtc precautions discussed, but tobacco cessation again recommended in light of this.    Meds ordered this encounter  Medications  . losartan-hydrochlorothiazide (HYZAAR) 100-12.5 MG per tablet    Sig: Take 1 tablet by mouth daily.    Dispense:  90 tablet    Refill:  1   Patient Instructions   cancer center for smoking cessation classes and options on quitting smoking.  You should receive a call or letter about your lab results within the next week to 10 days.  We will call you to schedule a physical, but can look into necessary follow up for your prior abnormal chest study at that time.  If any new or worsening cough before then - return to office for evaluation.  Work on diet and exercise as discussed.

## 2013-01-28 NOTE — Patient Instructions (Addendum)
[facility] for smoking cessation classes and options on quitting smoking.  You should receive a call or letter about your lab results within the next week to 10 days.  We will call you to schedule a physical, but can look into necessary follow up for your prior abnormal chest study at that time.  If any new or worsening cough before then - return to office for evaluation.  Work on diet and exercise as discussed.

## 2013-01-29 NOTE — Progress Notes (Signed)
[id number] is in your box.

## 2013-01-30 NOTE — Progress Notes (Signed)
CPE appt made with Dr. Neva Seat for 04/22/13.

## 2013-02-28 ENCOUNTER — Encounter: Payer: Self-pay | Admitting: Family Medicine

## 2013-02-28 ENCOUNTER — Ambulatory Visit: Payer: Medicare Other | Admitting: Family Medicine

## 2013-02-28 VITALS — BP 155/78 | HR 55 | Temp 97.8°F | Resp 16 | Ht 67.0 in | Wt 192.0 lb

## 2013-02-28 DIAGNOSIS — Z0289 Encounter for other administrative examinations: Secondary | ICD-10-CM

## 2013-02-28 DIAGNOSIS — E119 Type 2 diabetes mellitus without complications: Secondary | ICD-10-CM | POA: Insufficient documentation

## 2013-02-28 DIAGNOSIS — I10 Essential (primary) hypertension: Secondary | ICD-10-CM | POA: Insufficient documentation

## 2013-02-28 NOTE — Progress Notes (Signed)
Patient ID: Phillip Maynard. MRN: [id number], DOB: July 04, 1941 72 y.o. Date of Encounter: 02/28/2013, 10:05 AM  Primary Physician: Lucilla Edin, MD  Chief Complaint: Physical (CPE)  HPI: 72 y.o. y/o male with history noted below here for CPE.  Doing well. No issues/complaints.  Review of Systems: Consitutional: No fever, chills, fatigue, night sweats, lymphadenopathy, or weight changes. Eyes: No visual changes, eye redness, or discharge. ENT/Mouth: Ears: No otalgia, tinnitus, hearing loss, discharge. Nose: No congestion, rhinorrhea, sinus pain, or epistaxis. Throat: No sore throat, post nasal drip, or teeth pain. Cardiovascular: No CP, palpitations, diaphoresis, DOE, edema, orthopnea, PND. Respiratory: No cough, hemoptysis, SOB, or wheezing. Gastrointestinal: No anorexia, dysphagia, reflux, pain, nausea, vomiting, hematemesis, diarrhea, constipation, BRBPR, or melena. Genitourinary: No dysuria, frequency, urgency, hematuria, incontinence, nocturia, decreased urinary stream, discharge, impotence, or testicular pain/masses. Musculoskeletal: No decreased ROM, myalgias, stiffness, joint swelling, or weakness. Skin: No rash, erythema, lesion changes, pain, warmth, jaundice, or pruritis. Neurological: No headache, dizziness, syncope, seizures, tremors, memory loss, coordination problems, or paresthesias. Psychological: No anxiety, depression, hallucinations, SI/HI. Endocrine: No fatigue, polydipsia, polyphagia, polyuria, or known diabetes. All other systems were reviewed and are otherwise negative.  Past Medical History  Diagnosis Date  . Blood transfusion   . Asthma   . Bronchitis   . Hepatitis C   . Black lung   . Diabetes mellitus   . Hypertension      Past Surgical History  Procedure Laterality Date  . Tonsillectomy      Home Meds:  Prior to Admission medications   Medication Sig Start Date End Date Taking? Authorizing Provider  Lansoprazole (PREVACID PO) Take by  mouth as needed.   Yes Historical Provider, MD  losartan-hydrochlorothiazide (HYZAAR) 100-12.5 MG per tablet Take 1 tablet by mouth daily. 01/28/13  Yes Shade Flood, MD  Tadalafil (CIALIS) 2.5 MG TABS Take 1 tablet (2.5 mg total) by mouth daily as needed for erectile dysfunction. 11/01/12 12/01/12  Shade Flood, MD    Allergies:  Allergies  Allergen Reactions  . Penicillins Anaphylaxis  . Shellfish Allergy Anaphylaxis    History   Social History  . Marital Status: Married    Spouse Name: N/A    Number of Children: N/A  . Years of Education: N/A   Occupational History  . Not on file.   Social History Main Topics  . Smoking status: Current Every Day Smoker -- 1.00 packs/day  . Smokeless tobacco: Not on file  . Alcohol Use: Yes  . Drug Use: No  . Sexually Active: Yes   Other Topics Concern  . Not on file   Social History Narrative  . No narrative on file    Family History  Problem Relation Age of Onset  . Asthma Mother     Physical Exam:  Recheck BP 130/76 Blood pressure 155/78, pulse 55, temperature 97.8 F (36.6 C), temperature source Oral, resp. rate 16, height 5\' 7"  (1.702 m), weight 192 lb (87.091 kg), SpO2 99.00%.  General: Well developed, well nourished, in no acute distress. HEENT: Normocephalic, atraumatic. Conjunctiva pink, sclera non-icteric. Pupils 2 mm constricting to 1 mm, round, regular, and equally reactive to light and accomodation. EOMI. Internal auditory canal clear. TMs with good cone of light and without pathology. Nasal mucosa pink. Nares are without discharge. No sinus tenderness. Oral mucosa pink. Dentition poor shape. Pharynx without exudate.   Neck: Supple. Trachea midline. No thyromegaly. Full ROM. No lymphadenopathy. Lungs: Clear to auscultation bilaterally without wheezes, rales, or  rhonchi. Breathing is of normal effort and unlabored. Cardiovascular: RRR with S1 S2. No murmurs, rubs, or gallops appreciated. Distal pulses 2+  symmetrically. No carotid or abdominal bruits Abdomen: Soft, non-tender, non-distended with normoactive bowel sounds. No hepatosplenomegaly or masses. No rebound/guarding. No CVA tenderness. Without hernias.   Genitourinary:  circumcised male. No penile lesions. Testes descended bilaterally, and smooth without tenderness or masses.  Musculoskeletal: Full range of motion and 5/5 strength throughout. Without swelling, atrophy, tenderness, crepitus, or warmth. Extremities without clubbing, cyanosis, or edema. Calves supple. Skin: Warm and moist without erythema, ecchymosis, wounds, or rash. Neuro: A+Ox3. CN II-XII grossly intact. Moves all extremities spontaneously. Full sensation throughout. Normal gait. DTR 2+ throughout upper and lower extremities. Finger to nose intact. Psych:  Responds to questions appropriately with a normal affect.     Assessment/Plan:  72 y.o. y/o  male here for DOT PE -form completed for one year  Signed, [name], MD 02/28/2013 10:05 AM

## 2013-03-02 ENCOUNTER — Encounter: Payer: Self-pay | Admitting: Family Medicine

## 2013-04-05 ENCOUNTER — Emergency Department (HOSPITAL_COMMUNITY): Payer: Medicare Other

## 2013-04-05 ENCOUNTER — Inpatient Hospital Stay (HOSPITAL_COMMUNITY): Payer: Medicare Other

## 2013-04-05 ENCOUNTER — Encounter (HOSPITAL_COMMUNITY): Payer: Self-pay | Admitting: Emergency Medicine

## 2013-04-05 ENCOUNTER — Inpatient Hospital Stay (HOSPITAL_COMMUNITY)
Admission: EM | Admit: 2013-04-05 | Discharge: 2013-04-08 | DRG: 872 | Disposition: A | Discharge status: Home / Self Care | Payer: Medicare Other | Attending: Family Medicine | Admitting: Family Medicine

## 2013-04-05 DIAGNOSIS — I10 Essential (primary) hypertension: Secondary | ICD-10-CM

## 2013-04-05 DIAGNOSIS — N138 Other obstructive and reflux uropathy: Secondary | ICD-10-CM | POA: Diagnosis present

## 2013-04-05 DIAGNOSIS — N12 Tubulo-interstitial nephritis, not specified as acute or chronic: Secondary | ICD-10-CM

## 2013-04-05 DIAGNOSIS — E119 Type 2 diabetes mellitus without complications: Secondary | ICD-10-CM

## 2013-04-05 DIAGNOSIS — A4189 Other specified sepsis: Principal | ICD-10-CM | POA: Diagnosis present

## 2013-04-05 DIAGNOSIS — F17200 Nicotine dependence, unspecified, uncomplicated: Secondary | ICD-10-CM | POA: Diagnosis present

## 2013-04-05 DIAGNOSIS — A419 Sepsis, unspecified organism: Secondary | ICD-10-CM

## 2013-04-05 DIAGNOSIS — R6520 Severe sepsis without septic shock: Secondary | ICD-10-CM | POA: Diagnosis present

## 2013-04-05 DIAGNOSIS — N3941 Urge incontinence: Secondary | ICD-10-CM | POA: Diagnosis present

## 2013-04-05 DIAGNOSIS — R112 Nausea with vomiting, unspecified: Secondary | ICD-10-CM

## 2013-04-05 DIAGNOSIS — B1920 Unspecified viral hepatitis C without hepatic coma: Secondary | ICD-10-CM

## 2013-04-05 DIAGNOSIS — N179 Acute kidney failure, unspecified: Secondary | ICD-10-CM | POA: Diagnosis present

## 2013-04-05 DIAGNOSIS — N401 Enlarged prostate with lower urinary tract symptoms: Secondary | ICD-10-CM | POA: Diagnosis present

## 2013-04-05 DIAGNOSIS — K219 Gastro-esophageal reflux disease without esophagitis: Secondary | ICD-10-CM | POA: Diagnosis present

## 2013-04-05 DIAGNOSIS — J45909 Unspecified asthma, uncomplicated: Secondary | ICD-10-CM | POA: Diagnosis present

## 2013-04-05 DIAGNOSIS — E876 Hypokalemia: Secondary | ICD-10-CM | POA: Diagnosis not present

## 2013-04-05 DIAGNOSIS — N139 Obstructive and reflux uropathy, unspecified: Secondary | ICD-10-CM

## 2013-04-05 HISTORY — DX: Gastro-esophageal reflux disease without esophagitis: K21.9

## 2013-04-05 HISTORY — DX: Pure hypercholesterolemia, unspecified: E78.00

## 2013-04-05 HISTORY — DX: Type 2 diabetes mellitus without complications: E11.9

## 2013-04-05 LAB — COMPREHENSIVE METABOLIC PANEL
CO2: 28 mEq/L (ref 19–32)
Calcium: 9.2 mg/dL (ref 8.4–10.5)
Creatinine, Ser: 1.44 mg/dL — ABNORMAL HIGH (ref 0.50–1.35)
GFR calc Af Amer: 55 mL/min — ABNORMAL LOW (ref >90)
GFR calc non Af Amer: 48 mL/min — ABNORMAL LOW (ref >90)
Glucose, Bld: 143 mg/dL — ABNORMAL HIGH (ref 70–99)

## 2013-04-05 LAB — CBC WITH DIFFERENTIAL/PLATELET
Basophils Absolute: 0 10*3/uL (ref 0.0–0.1)
Eosinophils Relative: 1 % (ref 0–5)
HCT: 43 % (ref 39.0–52.0)
Lymphocytes Relative: 11 % — ABNORMAL LOW (ref 12–46)
Lymphs Abs: 0.5 10*3/uL — ABNORMAL LOW (ref 0.7–4.0)
MCV: 89.4 fL (ref 78.0–100.0)
Monocytes Absolute: 0 10*3/uL — ABNORMAL LOW (ref 0.1–1.0)
RDW: 13.6 % (ref 11.5–15.5)
WBC: 4.4 10*3/uL (ref 4.0–10.5)

## 2013-04-05 LAB — URINALYSIS, ROUTINE W REFLEX MICROSCOPIC
Protein, ur: 100 mg/dL — AB
Urobilinogen, UA: 0.2 mg/dL (ref 0.0–1.0)

## 2013-04-05 LAB — URINE MICROSCOPIC-ADD ON

## 2013-04-05 LAB — CG4 I-STAT (LACTIC ACID)
Lactic Acid, Venous: 4.16 mmol/L — ABNORMAL HIGH (ref 0.5–2.2)
Lactic Acid, Venous: 2.83 mmol/L — ABNORMAL HIGH (ref 0.5–2.2)

## 2013-04-05 LAB — PROCALCITONIN: Procalcitonin: 13.78 ng/mL

## 2013-04-05 LAB — LIPASE, BLOOD: Lipase: 34 U/L (ref 11–59)

## 2013-04-05 LAB — MRSA PCR SCREENING: MRSA by PCR: NEGATIVE

## 2013-04-05 MED ORDER — SODIUM CHLORIDE 0.9 % IV BOLUS (SEPSIS)
1000.0000 mL | Freq: Once | INTRAVENOUS | Status: AC
  Administered 2013-04-05: 1000 mL via INTRAVENOUS

## 2013-04-05 MED ORDER — AZTREONAM 2 G IJ SOLR
2.0000 g | Freq: Once | INTRAMUSCULAR | Status: AC
  Administered 2013-04-05: 2 g via INTRAVENOUS
  Filled 2013-04-05: qty 2

## 2013-04-05 MED ORDER — FESOTERODINE FUMARATE ER 4 MG PO TB24
4.0000 mg | ORAL_TABLET | Freq: Every day | ORAL | Status: DC
  Administered 2013-04-05 – 2013-04-08 (×4): 4 mg via ORAL
  Filled 2013-04-05 (×4): qty 1

## 2013-04-05 MED ORDER — ACETAMINOPHEN 325 MG PO TABS
650.0000 mg | ORAL_TABLET | Freq: Once | ORAL | Status: AC
  Administered 2013-04-05: 650 mg via ORAL
  Filled 2013-04-05: qty 2

## 2013-04-05 MED ORDER — LEVOFLOXACIN IN D5W 750 MG/150ML IV SOLN
750.0000 mg | Freq: Once | INTRAVENOUS | Status: AC
  Administered 2013-04-05: 750 mg via INTRAVENOUS
  Filled 2013-04-05: qty 150

## 2013-04-05 MED ORDER — ONDANSETRON HCL 4 MG/2ML IJ SOLN: 4.0000 mg | Freq: Once | INTRAMUSCULAR | Status: AC

## 2013-04-05 MED ORDER — ONDANSETRON 4 MG PO TBDP
8.0000 mg | ORAL_TABLET | Freq: Once | ORAL | Status: AC
  Administered 2013-04-05: 8 mg via ORAL
  Filled 2013-04-05: qty 2

## 2013-04-05 MED ORDER — PANTOPRAZOLE SODIUM 40 MG PO TBEC
40.0000 mg | DELAYED_RELEASE_TABLET | Freq: Every day | ORAL | Status: DC
  Administered 2013-04-05 – 2013-04-08 (×4): 40 mg via ORAL
  Filled 2013-04-05 (×5): qty 1

## 2013-04-05 MED ORDER — LEVOFLOXACIN IN D5W 750 MG/150ML IV SOLN
750.0000 mg | INTRAVENOUS | Status: DC
  Administered 2013-04-06 – 2013-04-08 (×3): 750 mg via INTRAVENOUS
  Filled 2013-04-05 (×3): qty 150

## 2013-04-05 MED ORDER — ONDANSETRON HCL 4 MG/2ML IJ SOLN
INTRAMUSCULAR | Status: AC
  Administered 2013-04-05: 4 mg via INTRAVENOUS
  Filled 2013-04-05: qty 2

## 2013-04-05 MED ORDER — DEXTROSE 5 % IV SOLN
1.0000 g | Freq: Three times a day (TID) | INTRAVENOUS | Status: DC
  Administered 2013-04-05 – 2013-04-08 (×9): 1 g via INTRAVENOUS
  Filled 2013-04-05 (×11): qty 1

## 2013-04-05 MED ORDER — HEPARIN SODIUM (PORCINE) 5000 UNIT/ML IJ SOLN
5000.0000 [IU] | Freq: Three times a day (TID) | INTRAMUSCULAR | Status: DC
  Administered 2013-04-05 – 2013-04-08 (×10): 5000 [IU] via SUBCUTANEOUS
  Filled 2013-04-05 (×13): qty 1

## 2013-04-05 MED ORDER — SODIUM CHLORIDE 0.9 % IV SOLN
1000.0000 mL | INTRAVENOUS | Status: DC
  Administered 2013-04-05 – 2013-04-06 (×4): 1000 mL via INTRAVENOUS

## 2013-04-05 NOTE — ED Notes (Signed)
Pt states that he started vomiting at 0430 this AM. Pt states that he has been having burning upon urination 3-4 days, pt's wife states x 1 week.

## 2013-04-05 NOTE — H&P (Signed)
PULMONARY  / CRITICAL CARE MEDICINE  Name: Phillip Maynard. MRN: [id number] DOB: [birth year]    ADMISSION DATE:  04/05/2013 CONSULTATION DATE:  8/22  REFERRING MD :  Lynelle Doctor  PRIMARY SERVICE: PCCM   CHIEF COMPLAINT:  Nausea, fever vomiting and back pain   BRIEF PATIENT DESCRIPTION:  72 yom f/b nesi for BPH, urinary urgency and incontinence. Admitted on 8/22 with severe sepsis in the setting of urinary tract source.   SIGNIFICANT EVENTS / STUDIES:  Renal US 8/22>>>  LINES / TUBES:  CULTURES: UC 8/22>>> BCX2 8/22>>>>  ANTIBIOTICS: levaquin 8/22>>>> Azactam 8/22>>>  HISTORY OF PRESENT ILLNESS:  72 year old male f/b Dr Brunilda Payor for urinary urgency/ incont and BPH. Recently started on alpha-blocker (presume but pt could not recall) about 3 wk prior to admit. Presents to ER on 8/22 w/ 5 d h/o dysuria, f/b rather acute onset the day of admit w/ fever, chills, nausea, vomiting, abd pain and left flank pain. On presentation was hypotensive but responded to IVF bolus. Will be admitted for severe sepsis presume UT source.  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   PAST MEDICAL HISTORY :  Past Medical History  Diagnosis Date  . Blood transfusion   . Asthma   . Bronchitis   . Hepatitis C   . Black lung   . Diabetes mellitus   . Hypertension    Past Surgical History  Procedure Laterality Date  . Tonsillectomy     Prior to Admission medications   Medication Sig Start Date End Date Taking? Authorizing Provider  lansoprazole (PREVACID) 30 MG capsule Take 30 mg by mouth daily as needed (for GERD).   Yes Historical Provider, MD  losartan-hydrochlorothiazide (HYZAAR) 100-12.5 MG per tablet Take 1 tablet by mouth daily. 01/28/13  Yes Shade Flood, MD   Allergies  Allergen Reactions  . Penicillins Anaphylaxis  . Shellfish Allergy Anaphylaxis    FAMILY HISTORY:  Family History  Problem Relation Age of Onset   . Asthma Mother    SOCIAL HISTORY:  reports that he has been smoking Cigarettes.  He has been smoking about 1.00 pack per day. He does not have any smokeless tobacco history on file. He reports that  drinks alcohol. He reports that he does not use illicit drugs.  REVIEW OF SYSTEMS (bolds positive):    Constitutional: No weight loss, gain, night sweats, Fevers, chills, fatigue .  HEENT: No headaches, visual changes, Difficulty swallowing, Tooth/dental problems, or Sore throat,  No sneezing, itching, ear ache, nasal congestion, post nasal drip, no visual complaints CV: No chest pain,w deep breath Orthopnea, PND, swelling in lower extremities, dizziness, palpitations, syncope.  GI No heartburn, indigestion, abdominal pain, nausea, vomiting, diarrhea, change in bowel habits, loss of appetite, bloody stools.  Resp: No cough, No coughing up of blood. No change in color of mucus. No wheezing.  Skin: no rash or itching or icterus GU: no dysuria, change in color of urine, no urgency or frequency. left flank pain, no hematuria  MS: No joint pain or swelling. No decreased range of motion  Psych: No change in mood or affect. No depression or anxiety.  Neuro: no difficulty with speech, weakness, numbness, ataxia   SUBJECTIVE:  Feeling better  VITAL SIGNS: Temp:  [99.8 F (37.7 C)-103 F (39.4 C)] 100 F (37.8 C) (08/22 1140) Pulse Rate:  [72-90] 72 (08/22 1201) Resp:  [12-28] 20 (  08/22 1201) BP: (85-104)/(38-58) 104/54 mmHg (08/22 1201) SpO2:  [93 %-99 %] 99 % (08/22 1201) HEMODYNAMICS:   VENTILATOR SETTINGS:   INTAKE / OUTPUT: Intake/Output   None     PHYSICAL EXAMINATION: General:  Well  Neuro:  Anxious but awake and alert w/out focal def  HEENT:  [place], no JVD  Cardiovascular:  rrr Lungs:  Crackles in posterior bases  Abdomen:  Non-tender  Musculoskeletal:  Pain to palp of left flank  Skin:  Diaphoretic   LABS:  CBC Recent Labs     04/05/13  0512  WBC  4.4  HGB  15.2   HCT  43.0  PLT  211   Coag's No results found for this basename: APTT, INR,  in the last 72 hours BMET Recent Labs     04/05/13  0512  NA  137  K  4.4  CL  99  CO2  28  BUN  19  CREATININE  1.44*  GLUCOSE  143*   Electrolytes Recent Labs     04/05/13  0512  CALCIUM  9.2   Sepsis Markers No results found for this basename: LACTICACIDVEN, PROCALCITON, O2SATVEN,  in the last 72 hours ABG No results found for this basename: PHART, PCO2ART, PO2ART,  in the last 72 hours Liver Enzymes Recent Labs     04/05/13  0512  AST  23  ALT  15  ALKPHOS  58  BILITOT  0.6  ALBUMIN  3.4*   Cardiac Enzymes No results found for this basename: TROPONINI, PROBNP,  in the last 72 hours Glucose No results found for this basename: GLUCAP,  in the last 72 hours  Imaging Dg Chest Port 1 View  04/05/2013   *RADIOLOGY REPORT*  Clinical Data: Fever, cough, and vomiting.  Code sepsis.  PORTABLE CHEST - 1 VIEW  Comparison: 04/29/2007  Findings: Shallow inspiration. Heart size and pulmonary vascularity are normal.  No definite edema.  Increased density in the lung bases is probably due to vascular crowding.  No focal consolidation.  No blunting of costophrenic angles.  No pneumothorax.  Mediastinal contours appear intact.  IMPRESSION: No evidence of active pulmonary disease.   Original Report Authenticated By: Burman Nieves, M.D.     CXR: bilateral pulm infiltrates. Mild.   ASSESSMENT / PLAN:  PULMONARY A: Mild pulmonary infiltrates. Edema vs evolving ALI P:   Careful w/ IVFs Supplemental oxygen F/u cxr.   CARDIOVASCULAR A: severe sepsis  Volume responsive  P:  Admit to ICU  IVFs F/u lactate   RENAL A:   H/o BPH and urinary urgency & incontinence . Is actively followed by [name]. Recently started on ___. Has had episodes of urinary incontinence when driving truck. Sitting in soiled pants for prolonged period of time  Urinary tract infection/ pyelonephritis.  Acute renal  failure prob due to above  P: IVFs Renal US  Have place call to urology to find out what alpha blocker he is on.  F/u chemistry   GASTROINTESTINAL A:   Nausea and vomiting Presume symptomatic response to infection/ pyelo  P:   Adv diet as tol   HEMATOLOGIC A:   No acute issue  P:  Trend cbc   INFECTIOUS A:   Severe sepsis, urinary tract source w/ pyelonephritis  Has responded to IVFs.  P:   Pan culture  Cycle PCTs Empiric abx  See renal sxn   ENDOCRINE A:    Mild hyperglycemia  P:   Ck A1C  NEUROLOGIC A:  No acute issue  P:   Supportive care     I have personally obtained a history, examined the patient, evaluated laboratory and imaging results, formulated the assessment and plan and placed orders.   Merwyn Katos, MD Pulmonary and Critical Care Medicine [facility] Pager: [phone]  04/05/2013, 12:29 PM

## 2013-04-05 NOTE — Care Management Note (Signed)
    Page 1 of 1   04/05/2013     2:16:12 PM   CARE MANAGEMENT NOTE 04/05/2013  Patient:  Phillip Maynard, Phillip Maynard   Account Number:  [id number]  Date Initiated:  04/05/2013  Documentation initiated by:  Junius Creamer  Subjective/Objective Assessment:   adm w sepsis     Action/Plan:   lives w wife, pcp dr Viviann Spare daub   Anticipated DC Date:     Anticipated DC Plan:        DC Planning Services  CM consult      Choice offered to / List presented to:             Status of service:   Medicare Important Message given?   (If response is "NO", the following Medicare IM given date fields will be blank) Date Medicare IM given:   Date Additional Medicare IM given:    Discharge Disposition:    Per UR Regulation:  Reviewed for med. necessity/level of care/duration of stay  If discussed at Long Length of Stay Meetings, dates discussed:    Comments:

## 2013-04-05 NOTE — Progress Notes (Signed)
ANTIBIOTIC CONSULT NOTE - INITIAL  Pharmacy Consult for Levaquin + Aztreonam Indication: r/o sepsis (possible urinary source)  Allergies  Allergen Reactions  . Penicillins Anaphylaxis  . Shellfish Allergy Anaphylaxis    Patient Measurements: Height: 5' 6.93" (170 cm) Weight: 192 lb 0.3 oz (87.1 kg) IBW/kg (Calculated) : 65.94  Vital Signs: Temp: 100 F (37.8 C) (08/22 1140) Temp src: Oral (08/22 1140) BP: 104/54 mmHg (08/22 1201) Pulse Rate: 72 (08/22 1201) Intake/Output from previous day:   Intake/Output from this shift:    Labs:  Recent Labs  04/05/13 0512  WBC 4.4  HGB 15.2  PLT 211  CREATININE 1.44*   Estimated Creatinine Clearance: 50.2 ml/min (by C-G formula based on Cr of 1.44). No results found for this basename: VANCOTROUGH, VANCOPEAK, VANCORANDOM, GENTTROUGH, GENTPEAK, GENTRANDOM, TOBRATROUGH, TOBRAPEAK, TOBRARND, AMIKACINPEAK, AMIKACINTROU, AMIKACIN,  in the last 72 hours   Microbiology: No results found for this or any previous visit (from the past 720 hour(s)).  Medical History: Past Medical History  Diagnosis Date  . Blood transfusion   . Asthma   . Bronchitis   . Hepatitis C   . Black lung   . Diabetes mellitus   . Hypertension     Assessment: 72 y.o. M who presented to the [facility] on 04/05/13 with dysuria x several days and persistent hypotensions. Pharmacy consulted to start Levaquin + Azactam for r/o sepsis of possible urinary source. Temp 103, WBC wnl, lactic acid 4.16, SCr 1.44, CrCl~50 ml/min.   The patient received Levaquin 750 mg around 0600 and Azactam around 0800 in the [facility].  Goal of Therapy:  Proper antibiotics for infection/cultures adjusted for renal/hepatic function   Plan:  1. Azactam 1g IV every 8 hours 2. Levaquin 750 mg IV every 24 hours 3. Will continue to follow renal function, culture results, LOT, and antibiotic de-escalation plans   Georgina Pillion, PharmD, BCPS Clinical Pharmacist Pager: [phone] 04/05/2013  12:47 PM

## 2013-04-05 NOTE — ED Provider Notes (Signed)
Patient is receiving his fourth liter of fluid her pressure remains borderline although he is in no distress and is hungry.  Plan is for repeat lactate after the fourth liter of fluid to determine disposition, ICU vs stepdown.   Repeat acid level has decreased, 2.83.  BP continues to remain low at 91/48.  Will consult with critical care.  Celene Kras, MD 04/05/13 1106

## 2013-04-05 NOTE — ED Notes (Signed)
Admitting at bedside 

## 2013-04-05 NOTE — ED Provider Notes (Signed)
CSN: [id number]     Arrival date & time 04/05/13  4098 History     First MD Initiated Contact with Patient 04/05/13 [phone]     Chief Complaint  Patient presents with  . Nausea  . Emesis   (Consider location/radiation/quality/duration/timing/severity/associated sxs/prior Treatment) HPI Comments: Pt reports dysuria for the past few days, recently has seen blood in urine.  Pt takes medication for enlarged prostate.  Denies abd pain, flank pain, cough, URI symptoms.    Patient is a 72 y.o. male presenting with vomiting. The history is provided by the patient and a relative.  Emesis Severity:  Moderate Duration:  1 hour Timing:  Constant Quality:  Undigested food and bilious material Progression:  Unchanged Chronicity:  New Recent urination:  Normal Worsened by:  Nothing tried Ineffective treatments:  None tried Associated symptoms: chills   Associated symptoms: no abdominal pain, no cough, no diarrhea and no URI   Risk factors: no sick contacts     Past Medical History  Diagnosis Date  . Blood transfusion   . Asthma   . Bronchitis   . Hepatitis C   . Black lung   . Diabetes mellitus   . Hypertension    Past Surgical History  Procedure Laterality Date  . Tonsillectomy     Family History  Problem Relation Age of Onset  . Asthma Mother    History  Substance Use Topics  . Smoking status: Current Every Day Smoker -- 1.00 packs/day    Types: Cigarettes  . Smokeless tobacco: Not on file  . Alcohol Use: Yes    Review of Systems  Constitutional: Positive for chills. Negative for appetite change.  HENT: Negative for congestion and rhinorrhea.   Respiratory: Negative for cough and shortness of breath.   Gastrointestinal: Positive for nausea and vomiting. Negative for abdominal pain and diarrhea.  Neurological: Negative for weakness.  All other systems reviewed and are negative.    Allergies  Penicillins and Shellfish allergy  Home Medications   Current Outpatient  Rx  Name  Route  Sig  Dispense  Refill  . lansoprazole (PREVACID) 30 MG capsule   Oral   Take 30 mg by mouth daily as needed (for GERD).         Marland Kitchen losartan-hydrochlorothiazide (HYZAAR) 100-12.5 MG per tablet   Oral   Take 1 tablet by mouth daily.   90 tablet   1    BP 95/49  Pulse 79  Temp(Src) 103 F (39.4 C) (Oral)  Resp 28  SpO2 95% Physical Exam  Nursing note and vitals reviewed. Constitutional: He is oriented to person, place, and time. He appears well-developed and well-nourished. No distress.  HENT:  Head: Normocephalic and atraumatic.  Eyes: Conjunctivae and EOM are normal. Left eye exhibits no discharge. No scleral icterus.  Neck: Normal range of motion. Neck supple.  Cardiovascular: Normal rate and intact distal pulses.   No murmur heard. Pulmonary/Chest: Effort normal. No respiratory distress. He has no wheezes. He has no rales.  Abdominal: He exhibits no distension. There is no tenderness. There is no rebound and no guarding.  Musculoskeletal: He exhibits no edema.  Neurological: He is alert and oriented to person, place, and time. He exhibits normal muscle tone. Coordination normal.  Skin: Skin is warm and dry. No rash noted. He is not diaphoretic.  Psychiatric: He has a normal mood and affect.    ED Course   Procedures (including critical care time)  CRITICAL CARE Performed by: Lear Ng. Total  critical care time: 30 min Critical care time was exclusive of separately billable procedures and treating other patients. Critical care was necessary to treat or prevent imminent or life-threatening deterioration. Critical care was time spent personally by me on the following activities: development of treatment plan with patient and/or surrogate as well as nursing, discussions with consultants, evaluation of patient's response to treatment, examination of patient, obtaining history from patient or surrogate, ordering and performing treatments and  interventions, ordering and review of laboratory studies, ordering and review of radiographic studies, pulse oximetry and re-evaluation of patient's condition.   Labs Reviewed  CBC WITH DIFFERENTIAL - Abnormal; Notable for the following:    Neutrophils Relative % 88 (*)    Lymphocytes Relative 11 (*)    Lymphs Abs 0.5 (*)    Monocytes Relative 0 (*)    Monocytes Absolute 0.0 (*)    All other components within normal limits  COMPREHENSIVE METABOLIC PANEL - Abnormal; Notable for the following:    Glucose, Bld 143 (*)    Creatinine, Ser 1.44 (*)    Albumin 3.4 (*)    GFR calc non Af Amer 48 (*)    GFR calc Af Amer 55 (*)    All other components within normal limits  CG4 I-STAT (LACTIC ACID) - Abnormal; Notable for the following:    Lactic Acid, Venous 4.16 (*)    All other components within normal limits  URINE CULTURE  CULTURE, BLOOD (ROUTINE X 2)  CULTURE, BLOOD (ROUTINE X 2)  LIPASE, BLOOD  URINALYSIS, ROUTINE W REFLEX MICROSCOPIC   Dg Chest Port 1 View  04/05/2013   *RADIOLOGY REPORT*  Clinical Data: Fever, cough, and vomiting.  Code sepsis.  PORTABLE CHEST - 1 VIEW  Comparison: 04/29/2007  Findings: Shallow inspiration. Heart size and pulmonary vascularity are normal.  No definite edema.  Increased density in the lung bases is probably due to vascular crowding.  No focal consolidation.  No blunting of costophrenic angles.  No pneumothorax.  Mediastinal contours appear intact.  IMPRESSION: No evidence of active pulmonary disease.   Original Report Authenticated By: Burman Nieves, M.D.   1. Sepsis   2. Urosepsis   3. Nausea and vomiting in adult     Lactic acid is up at >4, clinically with persistent hypotension, although no mentation changes, still meets sepsis criteria.  Level 2 sepsis alerted. Presumptive abx for UTI given.  Will give additional IVF bolus and reassess, consider repeated lactic acid after 2nd liter of IVF and IV abx given.    7:42 AM Spoke to Dr. Molli Knock,  would like to see repeat lactic acid after 4 L of IVF total.  If >3.2, would like to be called back and will accept to ICU.  Pt is signed out to Dr. Lynelle Doctor.  MDM  Pt with symptoms of possible UTI, now here with fever, emesis at home.  Pt is not confused, no overt signs or symptoms defining sepsis.  However, initial BP is marginal.  Will start IVF's, obtain cultures, tylenol for fever and closely monitor hemodynamics, send for lactic acid.    Gavin Pound. Oletta Lamas, MD 04/05/13 [phone]

## 2013-04-05 NOTE — ED Notes (Addendum)
Lactic acid being drawn

## 2013-04-05 NOTE — ED Notes (Signed)
Updated pt. And his wife on his plan of care.  Dr. Lynelle Doctor into speak with him.  Pt. Given a cup of coffee , authorized by Dr. Lynelle Doctor.  Pt. Denies any pain or discomfort.

## 2013-04-06 ENCOUNTER — Inpatient Hospital Stay (HOSPITAL_COMMUNITY): Payer: Medicare Other

## 2013-04-06 DIAGNOSIS — A419 Sepsis, unspecified organism: Secondary | ICD-10-CM

## 2013-04-06 LAB — PROCALCITONIN: Procalcitonin: 11.3 ng/mL

## 2013-04-06 MED ORDER — FUROSEMIDE 10 MG/ML IJ SOLN
40.0000 mg | Freq: Once | INTRAMUSCULAR | Status: AC
  Administered 2013-04-06: 40 mg via INTRAVENOUS
  Filled 2013-04-06: qty 4

## 2013-04-06 MED ORDER — ACETAMINOPHEN 325 MG PO TABS
650.0000 mg | ORAL_TABLET | Freq: Four times a day (QID) | ORAL | Status: DC | PRN
  Administered 2013-04-06: 650 mg via ORAL
  Filled 2013-04-06: qty 2

## 2013-04-06 NOTE — Progress Notes (Signed)
Critical value of gram negative rods in both aerobic bottle called to Dr Craige Cotta

## 2013-04-06 NOTE — Progress Notes (Signed)
PULMONARY  / CRITICAL CARE MEDICINE  Name: Phillip Maynard. MRN: [id number] DOB: 1940/09/12    ADMISSION DATE:  04/05/2013 CONSULTATION DATE:  8/22  REFERRING MD :  Phillip Maynard  PRIMARY SERVICE: PCCM   CHIEF COMPLAINT:  Nausea, fever vomiting and back pain   BRIEF PATIENT DESCRIPTION:  72 yom f/b nesi for BPH, urinary urgency and incontinence. Admitted on 8/22 with severe sepsis in the setting of urinary tract source.   SIGNIFICANT EVENTS / STUDIES:  Renal US 8/22>>>no hydro  LINES / TUBES:  CULTURES: UC 8/22>>> BCX2 8/22>>>>gnr 2/2>>>  ANTIBIOTICS: levaquin 8/22>>>> Azactam 8/22>>>   SUBJECTIVE:  Feeling better  VITAL SIGNS: Temp:  [98.5 F (36.9 C)-100.2 F (37.9 C)] 98.5 F (36.9 C) (08/23 0739) Pulse Rate:  [52-77] 59 (08/23 0800) Resp:  [11-25] 25 (08/23 0800) BP: (71-115)/(40-62) 88/50 mmHg (08/23 0800) SpO2:  [92 %-99 %] 93 % (08/23 0800) Weight:  [87.1 kg (192 lb 0.3 oz)-91.9 kg (202 lb 9.6 oz)] 91.9 kg (202 lb 9.6 oz) (08/23 0500) HEMODYNAMICS:   VENTILATOR SETTINGS:   INTAKE / OUTPUT: Intake/Output     08/22 0701 - 08/23 0700 08/23 0701 - 08/24 0700   P.O. 240    I.V. (mL/kg) 1458.3 (15.9)    IV Piggyback 250 50   Total Intake(mL/kg) 1948.3 (21.2) 50 (0.5)   Urine (mL/kg/hr) 1550 (0.7) 125 (0.6)   Total Output 1550 125   Net +398.3 -75        Stool Occurrence  1 x     PHYSICAL EXAMINATION: General:  Well  Neuro:  Anxious but awake and alert w/out focal def  HEENT:  [name], no JVD  Cardiovascular:  rrr Lungs:  Better bs Abdomen:  Non-tender  Musculoskeletal:  Less Pain to palp of left flank  Skin:  Diaphoretic   LABS:  CBC Recent Labs     04/05/13  0512  WBC  4.4  HGB  15.2  HCT  43.0  PLT  211   Coag's No results found for this basename: APTT, INR,  in the last 72 hours BMET Recent Labs     04/05/13  0512  NA  137  K  4.4  CL  99  CO2  28  BUN  19  CREATININE  1.44*  GLUCOSE  143*   Electrolytes Recent Labs   04/05/13  0512  CALCIUM  9.2   Sepsis Markers Recent Labs     04/05/13  1535  04/06/13  0350  PROCALCITON  13.78  11.30   ABG No results found for this basename: PHART, PCO2ART, PO2ART,  in the last 72 hours Liver Enzymes Recent Labs     04/05/13  0512  AST  23  ALT  15  ALKPHOS  58  BILITOT  0.6  ALBUMIN  3.4*   Cardiac Enzymes No results found for this basename: TROPONINI, PROBNP,  in the last 72 hours Glucose Recent Labs     04/05/13  1357  GLUCAP  129*    Imaging CXR: bilateral pulm infiltrates. Mild.   ASSESSMENT / PLAN:  PULMONARY A: Mild pulmonary infiltrates. Edema vs evolving ALI P:   ivf kvo Supplemental oxygen   CARDIOVASCULAR A: severe sepsis d/t urine source, better  P:  Reduce ivf     RENAL A:   H/o BPH and urinary urgency & incontinence . Is actively followed by Dr Phillip Maynard. Recently started on ___. 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  Acute renal failure prob due to above improved P: IVFs to kvo  GASTROINTESTINAL A:   Nausea and vomiting resolved Presume symptomatic response to infection/ pyelo  P:   Adv diet as tol   HEMATOLOGIC A:   No acute issue  P:  Trend cbc   INFECTIOUS A:   Severe sepsis, urinary tract source w/ pyelonephritis  Has responded to IVFs.  P:   F/u gnr in blood ENDOCRINE A:    Mild hyperglycemia  P:   Ck A1C  NEUROLOGIC A:   No acute issue  P:   Supportive care    tfr to floor and to [facility] I have personally obtained a history, examined the patient, evaluated laboratory and imaging results, formulated the assessment and plan and placed orders.   Phillip Maynard Beeper  [phone]  Cell  [phone]  If no response or cell goes to voicemail, call beeper [phone]   04/06/2013, 9:23 AM

## 2013-04-07 ENCOUNTER — Inpatient Hospital Stay (HOSPITAL_COMMUNITY): Payer: Medicare Other

## 2013-04-07 DIAGNOSIS — N390 Urinary tract infection, site not specified: Secondary | ICD-10-CM

## 2013-04-07 LAB — URINE CULTURE: Colony Count: >=100000

## 2013-04-07 LAB — CULTURE, BLOOD (ROUTINE X 2)

## 2013-04-07 LAB — CBC
MCH: 30.6 pg (ref 26.0–34.0)
Platelets: 204 10*3/uL (ref 150–400)
RBC: 4.54 MIL/uL (ref 4.22–5.81)
WBC: 15.7 10*3/uL — ABNORMAL HIGH (ref 4.0–10.5)

## 2013-04-07 LAB — BASIC METABOLIC PANEL
Calcium: 8.4 mg/dL (ref 8.4–10.5)
GFR calc non Af Amer: 61 mL/min — ABNORMAL LOW (ref >90)
Glucose, Bld: 118 mg/dL — ABNORMAL HIGH (ref 70–99)
Sodium: 137 mEq/L (ref 135–145)

## 2013-04-07 MED ORDER — ALBUTEROL SULFATE (5 MG/ML) 0.5% IN NEBU
INHALATION_SOLUTION | RESPIRATORY_TRACT | Status: AC
  Administered 2013-04-07: 2.5 mg
  Filled 2013-04-07: qty 0.5

## 2013-04-07 MED ORDER — IPRATROPIUM BROMIDE 0.02 % IN SOLN
0.5000 mg | RESPIRATORY_TRACT | Status: DC | PRN
  Administered 2013-04-07: 0.5 mg via RESPIRATORY_TRACT
  Filled 2013-04-07: qty 2.5

## 2013-04-07 MED ORDER — POTASSIUM CHLORIDE CRYS ER 20 MEQ PO TBCR
40.0000 meq | EXTENDED_RELEASE_TABLET | Freq: Once | ORAL | Status: AC
  Administered 2013-04-07: 40 meq via ORAL
  Filled 2013-04-07: qty 2

## 2013-04-07 MED ORDER — IPRATROPIUM BROMIDE 0.02 % IN SOLN
RESPIRATORY_TRACT | Status: AC
  Administered 2013-04-07: 0.5 mg
  Filled 2013-04-07: qty 2.5

## 2013-04-07 MED ORDER — ALBUTEROL SULFATE (5 MG/ML) 0.5% IN NEBU
2.5000 mg | INHALATION_SOLUTION | RESPIRATORY_TRACT | Status: DC | PRN
  Administered 2013-04-07: 2.5 mg via RESPIRATORY_TRACT
  Filled 2013-04-07: qty 0.5

## 2013-04-07 MED ORDER — FUROSEMIDE 10 MG/ML IJ SOLN
40.0000 mg | Freq: Once | INTRAMUSCULAR | Status: AC
  Administered 2013-04-07: 40 mg via INTRAVENOUS
  Filled 2013-04-07: qty 4

## 2013-04-07 NOTE — Progress Notes (Signed)
FMTS Attending progress  Note: Phillip Eniola,MD   72 y/O M with Pmx BPH,DM,HTN, admitted and treated for severe sepsis,transfered to FMTS today after he had improved. I assessed patient this morning,he denies any dysuria,he has urine incontinence on and off which is not new,this is worse with taking diuretics,he denies any fever over the last 24 hrs,his only concern now his coughing and SOB,which he also stated has improved some since he was given Lasix,he denies cough or chest congestion. He has hx of childhood asthma,had not been on inhaler for over 50 yrs.  Filed Vitals:   04/06/13 2143 04/07/13 0148 04/07/13 0500 04/07/13 0623  BP: 124/58 155/72  141/65  Pulse: 62 70  63  Temp: 98.9 F (37.2 C)   99.8 F (37.7 C)  TempSrc: Oral   Oral  Resp: 18 22  18   Height:      Weight:   202 lb 12.8 oz (91.989 kg)   SpO2: 96% 88%  96%   .Exam: Gen: Awake and alert,comfortable in bed. HEENT:PERRLA<EOMI. Resp: Air entry equal b/L with mild rhonchi globally. Heart: S1 S2 normal,no murmurs. Abdmen: benign. Ext: No edema.  A/P: 72 y/o m with. 1. Urosepsis: Urine culture positive for Proteus,sensitive to Levaquin.     Currently on IV Levaquin and Aztreonam.      Urine output adequate.      Doing well in general.  2. Cough/SOB/ O2 dependent:     Xray reviewed which shows pulmonary infiltrate suggestive of edema or pneumonia.     Patient already on A/B.     S/P 1 dose IV Lasix for pulmonary edema which makes him feel better.     Consider repeat Lasix if still symptomatic.     Recheck xray tomorrow for improvement.     O2 [place] as needed,monitor O2 sat.  3. BPH: Likely cause of UTI.     Currently on Toviaz,continue for now.  4.DM/HTN: Currently not on med.    BP slightly elevated,uncertain if he was on antihypertensive medication.    Monitor BP for now.    Diet control for DM,monitor CBG.

## 2013-04-07 NOTE — Progress Notes (Signed)
FMTS Attending Admission Note: Phillip Mervine,MD I  have seen and examined this patient, reviewed their chart. I have discussed this patient with the resident. I agree with the resident's findings, assessment and care plan.  

## 2013-04-07 NOTE — Progress Notes (Signed)
Family Medicine Teaching Service Daily Progress Note Intern Pager: [phone]  Patient name: Phillip Maynard. Medical record number: [id number] Date of birth: 1941-05-27 Age: 72 y.o. Gender: male  Primary Care Provider: Lucilla Edin, MD Consultants: PCCM Code Status: Full  Pt Overview and Major Events to Date:  8/22 - 8/23 ICU Course - initially hypotensive (95/49) minimal response to 4L IVF / LA 4.16--> 2.83-->3.2 / severe urosepsis secondary to Proteus (+ blood and urine cultures) / Levaquin IV and Aztreonam IV empiric abx 8/24 - VSS / continue abx Levaquin IV, Aztreonam IV (pending Blood cx sensitivities)   Assessment and Plan:  72 y.o. M who presented with fever, nausea / vomiting, dysuria, L-flank pain, w/o confusion, found to be hypotensive (95/49) in ED, with minimal response to IVF (received 4L), elevated LA 4.16 improved to 3.2, considered severe sepsis likely secondary to pyelo, started on empiric abx (Levaquin, Aztreonam IV) admitted to ICU. Improved, VSS, continued IV abx, transferred to floor (8/24), followed by FMTS. PMH is significant for BPH, intermittent urinary incontinence, DM, HTN, hx chronic tobacco abuse (w/o hx COPD).  # Severe Sepsis, secondary to Proteus M. pyelonephritis with bacteremia - improved Initial presentation of severe sepsis, hypotensive 95/49, with minimal response to 4L IVF, LA 4 --> 3.2 Significant improvement after continued IVF, abx - no longer considered severe sepsis. BP normalized. - VSS (BP 141/65), improved today, denies fever nausea/vomiting, improve L-flank pain, Good UOP, no AMS - blood culture (+ Proteus, pending sensitivities) - urine culture (+ Proteus, sensitive to Levaquin) - continue abx Levaquin IV and Aztreonam IV [ ]  consider de-escalating abx therapy in AM  # Bacteremia, Proteus M. See above course - "Sepsis" for details. - continue abx as above, pending blood culture sensitivities  # Pyelonephritis, Proteus M. See above  course - "Sepsis" for details. Symptoms improved, no dysuria, good UOP - continue abx as above, urine cx sensitive to Levaquin  # Shortness of Breath, secondary to suspected mild pulmonary edema Overnight c/o cough, chest congestion, shortness of breath, on 2.5L O2 [place] - CXR 8/23 and 8/24 consistent with mild diffuse pulmonary edema - given Lasix 40mg  IV daily (x 2 days) - with significant improvement in resp symptoms, Good UOP [ ]  f/u respiratory status, determine if need repeated Lasix dose in AM  # BPH Likely etiology for pyelonephritis Hx of urinary urgency and incontinence. Reported to recently have started alpha-blocker (< 1 month ago). [ ]  question of urinary obstruction etiology for pyelo - consider c/s to Urology or follow-up outpatient?  # HTN Initial presentation hypotensive, significant improvement and response. Stable 141/65 today - continue Losartan-HCTZ 100-12.5mg  daily  # DM Last HgbA1c 6.4. Well-controlled on diet / exercise, with intermittent Metformin trials. Recent 11mo period off of meds this year. No insulin requirement. [ ]  f/u CBGs - determine if any insulin needed (due to infection)  # Hypokalemia K+ 3.3, already given KCl x1 this AM [ ]  f/u BMET in AM  FEN/GI: KVO / regular diet / Protonix 40mg  daily PPx: Heparin subq  Disposition: Home pending clinical improvement  Subjective: Patient sitting up in chair at bedside. Comfortable, with family in room. He expresses no new complaints. Does report worsened shortness of breath and chest congestion overnight, which has dramatically improved to Lasix IV. Denies fevers, chills, dysuria, flank pain. Good appetite, good UOP, ambulation w/o assistance.  Objective: Temp:  [98.9 F (37.2 C)-100.3 F (37.9 C)] 99.8 F (37.7 C) (08/24 0623) Pulse Rate:  [62-70]  63 (08/24 2130) Resp:  [18-22] 18 (08/24 8657) BP: (124-155)/(58-88) 141/65 mmHg (08/24 0623) SpO2:  [88 %-96 %] 96 % (08/24 0623) Weight:  [202 lb  12.8 oz (91.989 kg)] 202 lb 12.8 oz (91.989 kg) (08/24 0500) Physical Exam: General: sitting in chair at bedside, well appearing, NAD Cardiovascular: RRR, no murmurs Respiratory: + scattered exp wheezing R > L, otherwise, mostly clear w/o crackles appreciated. Good air entry w/o increased work of breathing. Abdomen: soft, NTND Extremities: non-tender, no edema, moves all Neuro: alert, oriented, grossly non-focal  Laboratory:  Recent Labs Lab 04/05/13 0512 04/07/13 0540  WBC 4.4 15.7*  HGB 15.2 13.9  HCT 43.0 39.7  PLT 211 204    Recent Labs Lab 04/05/13 0512 04/07/13 0540  NA 137 137  K 4.4 3.3*  CL 99 102  CO2 28 24  BUN 19 16  CREATININE 1.44* 1.17  CALCIUM 9.2 8.4  PROT 7.4  --   BILITOT 0.6  --   ALKPHOS 58  --   ALT 15  --   AST 23  --   GLUCOSE 143* 118*   HgbA1c - 6.4  8/22 MRSA PCR - negative  8/22 Blood culture x2 - Positive - Proteus Mirabilis (sensitivities pending) 8/22 Urine Culture - Positive - Proteus Mirabilis (sensitive to Levaquin)  Imaging/Diagnostic Tests:  8/22 Portable CXR IMPRESSION:  No evidence of active pulmonary disease.  8/22 Renal US IMPRESSION:  No hydronephrosis.  Limited by ultrasound to evaluate for possibility of  pyelonephritis.  Left renal cyst minimally changed from remote exam.  8/24 Portable CXR IMPRESSION:  Diffuse interstitial pattern to the lungs suggesting interstitial  edema or interstitial pneumonia.   Saralyn Pilar, DO 04/07/2013, 1:32 PM PGY-1, [facility] Family Medicine FPTS Intern pager: [phone], text pages welcome

## 2013-04-07 NOTE — Progress Notes (Signed)
PULMONARY  / CRITICAL CARE MEDICINE  Name: Phillip Maynard. MRN: [id number] DOB: August 03, 1941    ADMISSION DATE:  04/05/2013 CONSULTATION DATE:  8/22  REFERRING MD :  Lynelle Doctor  PRIMARY SERVICE: PCCM   CHIEF COMPLAINT:  Nausea, fever vomiting and back pain   BRIEF PATIENT DESCRIPTION:  72 yom f/b nesi for BPH, urinary urgency and incontinence. Admitted on 8/22 with severe sepsis in the setting of urinary tract source.   SIGNIFICANT EVENTS / STUDIES:  Renal US 8/22>>>no hydro  LINES / TUBES:  CULTURES: UC 8/22>   100k plus gnr >>> BCX2 8/22>>>>gnr 2/2> proteus >>>  ANTIBIOTICS: levaquin 8/22>>>> Azactam 8/22>>>   SUBJECTIVE/Overnight Feeling better but sob overnight   VITAL SIGNS: Temp:  [98.9 F (37.2 C)-100.3 F (37.9 C)] 99.8 F (37.7 C) (08/24 4010) Pulse Rate:  [50-70] 63 (08/24 0623) Resp:  [16-22] 18 (08/24 0623) BP: (106-155)/(49-88) 141/65 mmHg (08/24 0623) SpO2:  [88 %-96 %] 96 % (08/24 0623) Weight:  [202 lb 12.8 oz (91.989 kg)] 202 lb 12.8 oz (91.989 kg) (08/24 0500) 02 rx  2.5  Lpm NP     INTAKE / OUTPUT: Intake/Output     08/23 0701 - 08/24 0700 08/24 0701 - 08/25 0700   P.O. 600    I.V. (mL/kg) 250 (2.7)    IV Piggyback 50    Total Intake(mL/kg) 900 (9.8)    Urine (mL/kg/hr) 1325 (0.6)    Total Output 1325     Net -425          Urine Occurrence 2 x    Stool Occurrence 1 x      PHYSICAL EXAMINATION: General:  Well  Neuro:  Alert   w/out focal def  HEENT:  [place], no JVD  Cardiovascular:  rrr Lungs:  Clear bilaterally  Abdomen:  Non-tender  Musculoskeletal:  Less Pain to palp of left flank  Skin:  Diaphoretic   LABS:  CBC Recent Labs     04/05/13  0512  04/07/13  0540  WBC  4.4  15.7*  HGB  15.2  13.9  HCT  43.0  39.7  PLT  211  204   Coag's No results found for this basename: APTT, INR,  in the last 72 hours BMET Recent Labs     04/05/13  0512  04/07/13  0540  NA  137  137  K  4.4  3.3*  CL  99  102  CO2  28  24  BUN   19  16  CREATININE  1.44*  1.17  GLUCOSE  143*  118*   Electrolytes Recent Labs     04/05/13  0512  04/07/13  0540  CALCIUM  9.2  8.4   Sepsis Markers Recent Labs     04/05/13  1535  04/06/13  0350  PROCALCITON  13.78  11.30   ABG No results found for this basename: PHART, PCO2ART, PO2ART,  in the last 72 hours Liver Enzymes Recent Labs     04/05/13  0512  AST  23  ALT  15  ALKPHOS  58  BILITOT  0.6  ALBUMIN  3.4*   Cardiac Enzymes No results found for this basename: TROPONINI, PROBNP,  in the last 72 hours Glucose Recent Labs     04/05/13  1357  GLUCAP  129*    Imaging pcxr 8/24 >  Diffuse interstitial pattern to the lungs suggesting interstitial  edema or interstitial pneumonia.   ASSESSMENT / PLAN:  PULMONARY A: Mild pulmonary  infiltrates. Edema vs evolving ALI> acute resp failure/ 02 dep P:   Lasix already given > better  Supplemental oxygen for sats > 92%  Keep neg now that bp ok and wean 02 off as tol   CARDIOVASCULAR A: severe sepsis d/t urine source, resolved        RENAL Lab Results  Component Value Date   CREATININE 1.17 04/07/2013   CREATININE 1.44* 04/05/2013   CREATININE 1.15 01/28/2013   CREATININE 1.10 11/01/2012   CREATININE 0.94 01/20/2012   CREATININE 1.07 04/29/2007    A:   H/o BPH and urinary urgency & incontinence . Is actively followed by [name].  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 (see ID section) Acute renal failure prob due to above resolved P: IVFs to kvo  GASTROINTESTINAL A:   Nausea and vomiting resolved Presume symptomatic response to infection/ pyelo  P:   Adv diet as tol   HEMATOLOGIC A:   No acute issues    INFECTIOUS A:   Severe sepsis, urinary tract source w/ pyelonephritis > see  [facility]    Rec  Abx per [facility] once sensitivies for Proteus complete   ENDOCRINE A:     Mild hyperglycemia     Hemoglobin A1C  Date Value Range Status  01/28/2013 6.4   Final  11/01/2012 6.4   Final  01/20/2012 7.0   Final    rx per [facility]   NEUROLOGIC A:   No acute issue     Discussed with [facility] - now that bp better would keep on dry side but tolerating ALI well so does not need PCCM imput at this point.  Prognosis with UTI sepsis is typically excellent as long as obstruction not an issue > defer to Urology/ [facility].  Please call if needed   Sandrea Hughs, MD Pulmonary and Critical Care Medicine [facility] Cell [phone] After 5:30 PM or weekends, call [phone]

## 2013-04-08 ENCOUNTER — Encounter (HOSPITAL_COMMUNITY): Payer: Self-pay | Admitting: General Practice

## 2013-04-08 DIAGNOSIS — N139 Obstructive and reflux uropathy, unspecified: Secondary | ICD-10-CM

## 2013-04-08 DIAGNOSIS — B1920 Unspecified viral hepatitis C without hepatic coma: Secondary | ICD-10-CM

## 2013-04-08 DIAGNOSIS — R112 Nausea with vomiting, unspecified: Secondary | ICD-10-CM

## 2013-04-08 DIAGNOSIS — E119 Type 2 diabetes mellitus without complications: Secondary | ICD-10-CM

## 2013-04-08 DIAGNOSIS — I10 Essential (primary) hypertension: Secondary | ICD-10-CM

## 2013-04-08 LAB — CBC
MCV: 86.8 fL (ref 78.0–100.0)
Platelets: 206 10*3/uL (ref 150–400)
RBC: 4.47 MIL/uL (ref 4.22–5.81)
WBC: 10.5 10*3/uL (ref 4.0–10.5)

## 2013-04-08 LAB — BASIC METABOLIC PANEL
CO2: 26 mEq/L (ref 19–32)
Calcium: 8.7 mg/dL (ref 8.4–10.5)
GFR calc Af Amer: 73 mL/min — ABNORMAL LOW (ref >90)
GFR calc non Af Amer: 63 mL/min — ABNORMAL LOW (ref >90)
Sodium: 136 mEq/L (ref 135–145)

## 2013-04-08 MED ORDER — POTASSIUM CHLORIDE CRYS ER 20 MEQ PO TBCR
40.0000 meq | EXTENDED_RELEASE_TABLET | Freq: Once | ORAL | Status: AC
  Administered 2013-04-08: 40 meq via ORAL
  Filled 2013-04-08: qty 2

## 2013-04-08 MED ORDER — SULFAMETHOXAZOLE-TMP DS 800-160 MG PO TABS
1.0000 | ORAL_TABLET | Freq: Two times a day (BID) | ORAL | Status: DC
  Administered 2013-04-08: 1 via ORAL
  Filled 2013-04-08: qty 1

## 2013-04-08 MED ORDER — SULFAMETHOXAZOLE-TMP DS 800-160 MG PO TABS: 1.0000 | ORAL_TABLET | Freq: Two times a day (BID) | ORAL | Status: DC

## 2013-04-08 NOTE — Progress Notes (Signed)
FMTS Attending Daily Note:  Renold Don MD  [phone] pager  Family Practice pager:  [phone] I have seen and examined this patient and have reviewed their chart. I have discussed this patient with the resident. I agree with the resident's findings, assessment and care plan.  Additionally:  Patient improved well.  Ambulating well, urinating well.  Afebrile and no leukocytosis noted.  Transition to PO antibiotics and if doing well, can likely DC home tomorrow.  Has FU with urology next week.    Tobey Grim, MD 04/08/2013 4:21 PM

## 2013-04-08 NOTE — Progress Notes (Signed)
Verbally understood dc instructions, no questions ask, home with wife

## 2013-04-08 NOTE — Progress Notes (Signed)
Family Medicine Teaching Service Daily Progress Note Intern Pager: [phone]  Patient name: Phillip Maynard. Medical record number: [id number] Date of birth: 1941-01-25 Age: 72 y.o. Gender: male  Primary Care Provider: Lucilla Edin, MD Consultants: PCCM Code Status: Full  Pt Overview and Major Events to Date:  8/22 - 8/23 ICU Course - initially hypotensive (95/49) minimal response to 4L IVF / LA 4.16--> 2.83-->3.2 / severe urosepsis secondary to Proteus (+ blood and urine cultures) / Levaquin IV and Aztreonam IV empiric abx 8/24 - VSS / continue abx Levaquin IV, Aztreonam IV (pending Blood cx sensitivities) 8/25 - VSS, afebrile / plan to de-escalate Abx - DC Levaquin and Aztreonam IV --> start Bactrim PO  Assessment and Plan:  72 y.o. M who presented with fever, nausea / vomiting, dysuria, L-flank pain, w/o confusion, found to be hypotensive (95/49) in ED, with minimal response to IVF (received 4L), elevated LA 4.16 improved to 3.2, considered severe sepsis likely secondary to pyelo, started on empiric abx (Levaquin, Aztreonam IV) admitted to ICU. Improved, VSS, continued IV abx, transferred to floor (8/24), followed by FMTS. PMH is significant for BPH, intermittent urinary incontinence, DM, HTN, hx chronic tobacco abuse (w/o hx COPD).  # Severe Sepsis, secondary to Proteus M. pyelonephritis with bacteremia - improved Initial presentation of severe sepsis, hypotensive 95/49, with minimal response to 4L IVF, LA 4 --> 3.2 Significant improvement after continued IVF, abx - no longer considered severe sepsis. BP normalized. - VSS (BP 132/59), continued improvement, denies fever, n/v, no L-flank pain, Good UOP, no AMS - WBC trending down 10.5 (15.7) - blood and urine cultures (+ Proteus) - pansensitive - switch abx to Bactrim PO (de-escalated from Levaquin IV and Aztreonam IV)  # Bacteremia, Proteus M. See above course - "Sepsis" for details. - continue abx as above, pending blood culture  sensitivities  # Pyelonephritis, Proteus M. See above course - "Sepsis" for details. Symptoms improved, no dysuria, good UOP - continue abx as above, urine cx sensitive to Levaquin  # Shortness of Breath, secondary to suspected mild pulmonary edema - resolved CXR 8/23 and 8/24 consistent with mild diffuse pulmonary edema (8/24) Overnight c/o cough, chest congestion, shortness of breath, on 2.5L O2 [place] (8/25) minimal cough, no SoB, clear chest, with no desaturation off of O2 (slept w/o O2) [ ]  determine O2 requirement - rest and ambulation - resolved - has received Lasix 40mg  IV daily (x 2 days, none today), Good UOP - no further Lasix dose needed at this time, continue to monitor resp status  # BPH Likely etiology for pyelonephritis Hx of urinary urgency and incontinence. Reported to recently have started alpha-blocker (< 1 month ago). [ ]  question of urinary obstruction etiology for pyelo - consider c/s to Urology or follow-up outpatient? - continue Fesoterodine Gala Murdoch) 4mg  daily - urinary urgency - Has Urology apt scheduled with Dr. Julien Girt on 8/28  # HTN Initial presentation hypotensive, significant improvement and response. Stable 132/59 today - continue Losartan-HCTZ 100-12.5mg  daily  # DM Last HgbA1c 6.4. Well-controlled on diet / exercise, with intermittent Metformin trials. Recent 12mo period off of meds this year. No insulin requirement. [ ]  f/u CBGs - determine if any insulin needed (due to infection) - stable, CBGs 130s - no insulin needed  # Hypokalemia K+ 3.3, already given KCl x1 this AM [ ]  f/u BMET in AM - K 3.4 (3.3) - given KCl  FEN/GI: KVO / regular diet / Protonix 40mg  daily PPx: Heparin subq  Disposition: Home pending clinical improvement  Subjective: Patient sitting up in bed this morning. He reports feeling better today, with continued improvement each day. His breathing has improved since yesterday, no longer complains of cough or shortness  of breath, the Lasix IV helped him with good UOP, still reports good UOP w/o urinary urgency anymore. Chest no longer feels congested. Reports improvement from from albuterol nebulizer last night. No hx of COPD (only childhood asthma). Discussed following up outpatient for lung function testing. Overall, good appetite, able to ambulate.  Objective: Temp:  [98.8 F (37.1 C)-98.9 F (37.2 C)] 98.8 F (37.1 C) (08/25 0447) Pulse Rate:  [56-65] 58 (08/25 0447) Resp:  [18-20] 18 (08/25 0447) BP: (124-138)/(59-68) 132/59 mmHg (08/25 0447) SpO2:  [97 %-100 %] 98 % (08/25 0447) Weight:  [191 lb 3.2 oz (86.728 kg)-193 lb 12.8 oz (87.907 kg)] 191 lb 3.2 oz (86.728 kg) (08/25 1610) Physical Exam: General: sitting up in bed, well appearing, NAD Cardiovascular: RRR, no murmurs Respiratory: Mostly CTAB (improved, no wheezing), +scattered rhonchi bilateral bases, no crackles. Good air entry w/o increased work of breathing. Abdomen: soft, NTND Extremities: non-tender, no edema, moves all Neuro: alert, oriented, grossly non-focal  Laboratory:  Recent Labs Lab 04/05/13 0512 04/07/13 0540 04/08/13 0555  WBC 4.4 15.7* 10.5  HGB 15.2 13.9 13.8  HCT 43.0 39.7 38.8*  PLT 211 204 206    Recent Labs Lab 04/05/13 0512 04/07/13 0540 04/08/13 0555  NA 137 137 136  K 4.4 3.3* 3.4*  CL 99 102 101  CO2 28 24 26   BUN 19 16 13   CREATININE 1.44* 1.17 1.15  CALCIUM 9.2 8.4 8.7  PROT 7.4  --   --   BILITOT 0.6  --   --   ALKPHOS 58  --   --   ALT 15  --   --   AST 23  --   --   GLUCOSE 143* 118* 134*   HgbA1c - 6.4  8/22 MRSA PCR - negative  8/22 Blood culture x2 - Positive - Proteus Mirabilis - pan-sensitive 8/22 Urine Culture - Positive - Proteus Mirabilis - pan-sensitive  Imaging/Diagnostic Tests:  8/22 Portable CXR IMPRESSION:  No evidence of active pulmonary disease.  8/22 Renal US IMPRESSION:  No hydronephrosis.  Limited by ultrasound to evaluate for possibility of   pyelonephritis.  Left renal cyst minimally changed from remote exam.  8/24 Portable CXR IMPRESSION:  Diffuse interstitial pattern to the lungs suggesting interstitial  edema or interstitial pneumonia.   Saralyn Pilar, DO 04/08/2013, 9:12 AM PGY-1, [facility] FPTS Intern pager: [phone], text pages welcome

## 2013-04-08 NOTE — Discharge Summary (Signed)
Family Medicine Teaching [facility] Discharge Summary  Patient name: Phillip Maynard. Medical record number: [id number] Date of birth: October 28, 1940 Age: 72 y.o. Gender: male Date of Admission: 04/05/2013  Date of Discharge: 04/08/2013 Admitting Physician: Phillip Katos, MD  Primary Care Provider: Lucilla Edin, MD Consultants: PCCM  Indication for Hospitalization: Severe Sepsis, secondary to pyelonephritis and bacteremia, Hypotension  Discharge Diagnoses/Problem List:  Severe Sepsis, secondary to Proteus Mirabilis pyelonephritis and bacteremia - resolved Pyelonephritis, complicated (culture positive Proteus M.) - improved Bacteremia, (culture positive Proteus M.) - improved Hypotension, secondary to severe sepsis - resolved Acute Pulmonary Edema, mild  - resolved HTN BPH DM Tobacco Abuse Hypokalemia - resolved  Disposition: Home  Discharge Condition: Stable  Brief Hospital Course:  72 y.o. M who presented with fever, nausea / vomiting, dysuria, L-flank pain, w/o confusion, found to be hypotensive (95/49) in ED, with minimal response to IVF (received 4L), elevated LA 4.16 improved to 3.2, considered severe sepsis likely secondary to pyelo, started on empiric abx (Levaquin, Aztreonam IV) admitted to ICU. Improved, VSS, continued IV abx, transferred to floor (8/24), followed by FMTS. PMH is significant for BPH, intermittent urinary incontinence, DM, HTN, hx chronic tobacco abuse (w/o hx COPD).  # Severe Sepsis, secondary to Proteus M. pyelonephritis with bacteremia - improved  Initial presentation of fever, nausea/vomiting, L-flank pain, suspected pyelo, found to be severe sepsis, hypotensive 95/49, with minimal response to 4L IVF, elevated Lactic Acid 4 --> 3.2, admitted to PCCM, started empiric Levaquin and Aztreonam IV. Significant improvement after continued IVF, abx, BP normalized and no longer considered severe sepsis. Stable, transferred to FMTS on 8/24, without fever,  n/v, or flank pain. Blood and Urine cultures were positive for pan-sensitive Proteus Mirabilis. De-escalated abx from IV to PO Bactrim x 7 days on discharge.  # Hypotension, secondary to severe sepsis - resolved See above course - "Sever sepsis" for details.  # Bacteremia, Proteus M. See above course - "Severe sepsis" for details.  # Pyelonephritis, Proteus M.  See above course - "Severe sepsis" for details.   # Shortness of Breath, secondary to suspected mild pulmonary edema - resolved Concern for mild acute pulmonary edema due to fluid hydration, CXR 8/23 consistent with mild diffuse pulm edema,  good response to Lasix 40mg  IV x2 doses, dramatic improvement in resp status. No oxygen requirement, without desaturation on RA. Continued improved resp status on discharge w/o further need for Lasix..  # BPH  Suspected etiology for pyelonephritis, possible urinary obstruction. Hx of urinary urgency and incontinence. Reported to recently have started alpha-blocker (< 1 month ago). Continued Fesoterodine (Toviaz) 4mg  daily with good improvement with urinary urgency. Patient has follow-up appointment with Urology, Phillip Maynard on 8/28.  # HTN  Initial presentation hypotensive, held home BP med throughout hospitalization. Significant improvement and response to IVF hydration. BP stable and normal at 120/75 on day of discharge. Patient agreed to plan to HOLD Losartan-HCTZ 100-12.5mg  daily until he sees his PCP Phillip Maynard (Phillip Maynard) on 8/28, re-check BP and determine if should restart and at what dose.  # DM  Last HgbA1c 6.4. Well-controlled on diet / exercise, with intermittent Metformin trials. Recent 48mo period off of meds this year. No insulin requirement during hospitalization.  # Hypokalemia - Low K+ 3.3, responded with PO repletion. Stable.  Issues for Follow Up:  1. Completion Antibiotic therapy - continue Bactrim-DS 800-160mg  BID x 7 days (last day of therapy 8/31). Confirm no issues with rash or  Phillip Maynard,  provided education to patient on discharge.  2. BP Med - Advised patient to HOLD Losartan-HCTZ 100-12.5mg  daily until he sees PCP Phillip Maynard on 8/28, re-check BP and determine if should restart and at what dose. Patient acknowledged he has BP cuff at home, and told to restart BP med if systolic >160.  3. Urology - Follow-up apt with Phillip Maynard on 8/28. Encouraged patient to explore options for improving urinary frequency. Question if current episode of pyelonephritis, could be secondary to urinary obstruction from BPH.  4. Pulmonary Function Testing - Possibly recommend outpatient PFTs to determine if patient has component of COPD, due to long smoking hx. Significant improvement in SoB and wheezing noted with Duoneb Nebulizer therapy during hospitalization.  Significant Procedures: None  Significant Labs and Imaging:   Recent Labs Lab 04/05/13 0512 04/07/13 0540 04/08/13 0555  WBC 4.4 15.7* 10.5  HGB 15.2 13.9 13.8  HCT 43.0 39.7 38.8*  PLT 211 204 206    Recent Labs Lab 04/05/13 0512 04/07/13 0540 04/08/13 0555  NA 137 137 136  K 4.4 3.3* 3.4*  CL 99 102 101  CO2 28 24 26   GLUCOSE 143* 118* 134*  BUN 19 16 13   CREATININE 1.44* 1.17 1.15  CALCIUM 9.2 8.4 8.7  ALKPHOS 58  --   --   AST 23  --   --   ALT 15  --   --   ALBUMIN 3.4*  --   --    HgbA1c - 6.4  8/22 MRSA PCR - negative  8/22 Blood culture x2 - Positive - Proteus Mirabilis - pan-sensitive  8/22 Urine Culture - Positive - Proteus Mirabilis - pan-sensitive  Imaging/Diagnostic Tests:  8/22 Portable CXR  IMPRESSION:  No evidence of active pulmonary disease.  8/22 Renal US  IMPRESSION:  No hydronephrosis.  Limited by ultrasound to evaluate for possibility of  pyelonephritis.  Left renal cyst minimally changed from remote exam.  8/24 Portable CXR  IMPRESSION:  Diffuse interstitial pattern to the lungs suggesting interstitial  edema or interstitial pneumonia.  Results/Tests  Pending at Time of Discharge: None  Discharge Medications:    Medication List    STOP taking these medications       losartan-hydrochlorothiazide 100-12.5 MG per tablet  Commonly known as:  HYZAAR      TAKE these medications       lansoprazole 30 MG capsule  Commonly known as:  PREVACID  Take 30 mg by mouth daily as needed (for GERD).     sulfamethoxazole-trimethoprim 800-160 MG per tablet  Commonly known as:  BACTRIM DS  Take 1 tablet by mouth 2 (two) times daily.        Discharge Instructions: Please refer to Patient Instructions section of EMR for full details.  Patient was counseled important signs and symptoms that should prompt return to medical care, changes in medications, dietary instructions, activity restrictions, and follow up appointments.   Follow-Up Appointments: Follow-up Information   Follow up with Shade Flood, MD In 1 week. [facility] follow-up apt needs to be done through "[address] [facility]". Phillip Maynard will be there Thursday (8/28) after 5pm OR Friday (8/29) in the morning. Keep your previously scheduled appointment for 9/15 as well. )    Specialty:  Sports Medicine   Contact information:   [address] [place] [address] [phone]       Follow up with NESI,MARC-HENRY, MD On 04/11/2013. (already scheduled for Thursday 8/28)    Specialty:  Urology   Contact information:  [address] [place] [address] [phone]       Saralyn Pilar, DO 04/09/2013, 5:56 PM PGY-1, [facility]

## 2013-04-08 NOTE — Progress Notes (Signed)
DC home with wife, verbally understood DC instructions, no questions ask. 

## 2013-04-11 ENCOUNTER — Ambulatory Visit (INDEPENDENT_AMBULATORY_CARE_PROVIDER_SITE_OTHER): Payer: Medicare Other | Admitting: Family Medicine

## 2013-04-11 ENCOUNTER — Ambulatory Visit: Payer: Medicare Other

## 2013-04-11 VITALS — BP 140/82 | HR 49 | Temp 98.7°F | Resp 18 | Ht 67.5 in | Wt 189.0 lb

## 2013-04-11 DIAGNOSIS — Z8709 Personal history of other diseases of the respiratory system: Secondary | ICD-10-CM

## 2013-04-11 DIAGNOSIS — R05 Cough: Secondary | ICD-10-CM

## 2013-04-11 DIAGNOSIS — F17200 Nicotine dependence, unspecified, uncomplicated: Secondary | ICD-10-CM

## 2013-04-11 DIAGNOSIS — N12 Tubulo-interstitial nephritis, not specified as acute or chronic: Secondary | ICD-10-CM

## 2013-04-11 DIAGNOSIS — Z720 Tobacco use: Secondary | ICD-10-CM

## 2013-04-11 DIAGNOSIS — I10 Essential (primary) hypertension: Secondary | ICD-10-CM

## 2013-04-11 DIAGNOSIS — E876 Hypokalemia: Secondary | ICD-10-CM

## 2013-04-11 LAB — POCT CBC
HCT, POC: 47 % (ref 43.5–53.7)
MCH, POC: 30.3 pg (ref 27–31.2)
MCV: 94.2 fL (ref 80–97)
MID (cbc): 0.7 (ref 0–0.9)
POC LYMPH PERCENT: 40.6 %L (ref 10–50)
Platelet Count, POC: 302 10*3/uL (ref 142–424)
RBC: 4.99 M/uL (ref 4.69–6.13)
WBC: 9.1 10*3/uL (ref 4.6–10.2)

## 2013-04-11 MED ORDER — SULFAMETHOXAZOLE-TMP DS 800-160 MG PO TABS: 1.0000 | ORAL_TABLET | Freq: Two times a day (BID) | ORAL | Status: DC

## 2013-04-11 MED ORDER — ALBUTEROL SULFATE HFA 108 (90 BASE) MCG/ACT IN AERS: 2.0000 | INHALATION_SPRAY | Freq: Four times a day (QID) | RESPIRATORY_TRACT | Status: DC | PRN

## 2013-04-11 NOTE — Progress Notes (Signed)
Subjective:    Patient ID: Phillip Oliphant., male    DOB: 1941-01-23, 72 y.o.   MRN: [id number]  HPI Phillip Maynard. is a 72 y.o. male Hx of Dm2 - well controlled. , and recent soitalization for sepsis, secondary to Proteus Mirabilis pyelonephritis.  Admitted 04/05/13 -04/08/13. Hypotensive in ED, admitted to ICU, treated with Levaquin, aztreonam.  Changed antibiotic to PO bactrim DS twice per day, but had been taking 1/2 pill twice per day d/t misunderstanding in dose. Day #6/10 of antibiotics. - due to complete 04/14/13.  No new side effects of meds, no rashes. No known sulfa allergy. No measured fever, but cold at times.  Dr. Brunilda Payor - urologist for frequency, urgency, BPH.  Saw Dr. Brunilda Payor today. New rx called in for prostate to help with urination. Had urine test at Dr. Madilyn Hook office today - looked ok.  HTN - held meds since hypotensive in hospital. Plan to restart med if BP over 160. Home BP141/73 this am. No chest pains, no shortness of breath.   Has had some cough for past week or so, no worsening. Noticed cough at night, slight wheezing at night? No heartburn. Taking prevacid as needed only.- about once a week. Mild pulmonary edema - on CXR 04/06/13- mild diffuse, thought secondary to IVF. Tx: lasix 40mg  IV x 2 doses in hospital. Using incentive spirometer.   Hypokalemia with K 3.3 in hospital - resolved with PO repletion.     Past Medical History  Diagnosis Date  . Blood transfusion 1970's  . Bronchitis   . Hepatitis C   . Black lung   . Hypertension   . High cholesterol   . Type II diabetes mellitus   . GERD (gastroesophageal reflux disease)   . Asthma     "when I was a boy" (04/08/2013)   Past Surgical History  Procedure Laterality Date  . Tonsillectomy    . Incision and drainage of wound Right 1970's    "leg" (04/08/2013)  . Liver biopsy  ~ 2011    Allergies  Allergen Reactions  . Penicillins Anaphylaxis  . Shellfish Allergy Anaphylaxis   Prior to Admission  medications   Medication Sig Start Date End Date Taking? Authorizing Provider  lansoprazole (PREVACID) 30 MG capsule Take 30 mg by mouth daily as needed (for GERD).   Yes Historical Provider, MD  sulfamethoxazole-trimethoprim (BACTRIM DS) 800-160 MG per tablet Take 1 tablet by mouth 2 (two) times daily. 04/08/13  Yes Saralyn Pilar, DO   History   Social History  . Marital Status: Married    Spouse Name: N/A    Number of Children: N/A  . Years of Education: N/A   Occupational History  . Not on file.   Social History Main Topics  . Smoking status: Current Every Day Smoker -- 1.00 packs/day for 45 years    Types: Cigarettes  . Smokeless tobacco: Never Used  . Alcohol Use: Yes     Comment: 04/08/2013 "haven't drank nothing in > 3 yr; before then I'd have at least 1 pint/day"  . Drug Use: No  . Sexual Activity: Yes   Other Topics Concern  . Not on file   Social History Narrative  . No narrative on file     Review of Systems  Constitutional: Negative for fever and chills.  Eyes: Negative for visual disturbance.  Respiratory: Positive for cough. Negative for chest tightness and shortness of breath.   Cardiovascular: Negative for chest pain, palpitations and leg swelling.  Gastrointestinal: Negative for abdominal pain.  Neurological: Negative for dizziness, light-headedness and headaches.       Objective:   Physical Exam  Vitals reviewed. Constitutional: He is oriented to person, place, and time. He appears well-developed and well-nourished.  HENT:  Head: Normocephalic and atraumatic.  Eyes: EOM are normal. Pupils are equal, round, and reactive to light.  Neck: No JVD present. Carotid bruit is not present.  Cardiovascular: Normal rate, regular rhythm and normal heart sounds.   No murmur heard. Pulmonary/Chest: Effort normal and breath sounds normal. No respiratory distress. He has no wheezes. He has no rales.  Abdominal: Soft. Bowel sounds are normal. He exhibits  no distension. There is no tenderness. There is no rebound and no guarding.  No cvat.   Musculoskeletal: He exhibits no edema.  Neurological: He is alert and oriented to person, place, and time.  Skin: Skin is warm and dry.  Psychiatric: He has a normal mood and affect.    [facility] reading (PRIMARY) by  Dr. Neva Seat: CXR - improved aeration, less vascular markings/edema.   Results for orders placed in visit on 04/11/13  POCT CBC      Result Value Range   WBC 9.1  4.6 - 10.2 K/uL   Lymph, poc 3.7 (*) 0.6 - 3.4   POC LYMPH PERCENT 40.6  10 - 50 %L   MID (cbc) 0.7  0 - 0.9   POC MID % 7.2  0 - 12 %M   POC Granulocyte 4.8  2 - 6.9   Granulocyte percent 52.2  37 - 80 %G   RBC 4.99  4.69 - 6.13 M/uL   Hemoglobin 15.1  14.1 - 18.1 g/dL   HCT, POC 16.1  09.6 - 53.7 %   MCV 94.2  80 - 97 fL   MCH, POC 30.3  27 - 31.2 pg   MCHC 32.1  31.8 - 35.4 g/dL   RDW, POC 04.5     Platelet Count, POC 302  142 - 424 K/uL   MPV 10.1  0 - 99.8 fL       Assessment & Plan:  Phillip Maynard. is a 72 y.o. male  Pyelonephritis - Plan: sulfamethoxazole-trimethoprim (BACTRIM DS) 800-160 MG per tablet, POCT CBC, Basic metabolic panel.  Due to 1/2 dosing of bactrim past few days, will add 3 days to course discussed need for 1 tablet BID.  U/a done at other office reasurring by report, afebrile, improving. rtc precautions.  Cough- nighttime only, LPR with hx of GERD vs asthmatic/COPD with tobacco use, less likely CHF. Improved cxr. Restart PPI QD, albuterol if needed, rtc precautions.   Hx pulmonary edema with fluids in hospital. cxr improved. Clear on exam. Schedule Echo to eval EF.   HTN - borderline now, but low in hospital. Plan to restart usual antihypertensive once BP above 160 systolic.   Hypokalemia - recheck BMP.   Meds ordered this encounter  Medications  . sulfamethoxazole-trimethoprim (BACTRIM DS) 800-160 MG per tablet    Sig: Take 1 tablet by mouth 2 (two) times daily. After completion of  other prescription (3 additional days of treatment)    Dispense:  6 tablet    Refill:  0  . albuterol (PROVENTIL HFA;VENTOLIN HFA) 108 (90 BASE) MCG/ACT inhaler    Sig: Inhale 2 puffs into the lungs every 6 (six) hours as needed for wheezing.    Dispense:  1 Inhaler    Refill:  0   Patient Instructions  Continue antibiotic as  prescribed (twice per day), will extend course of antibiotic for 3 more days. Restart acid blocker every day for now as heartburn can cause cough. Albuterol inhaler if needed for wheezing, but let me know if you have to use this. You should receive a call or letter about your lab results within the next week to 10 days.  Keep a record of your blood pressures outside of the office each day, and when upper number over 160 - restart your blood pressure medicine.   Return to the clinic or go to the nearest emergency room if any of your symptoms worsen or new symptoms occur. Recheck with Dr. Neva Seat next Wednesday night after 5pm.

## 2013-04-11 NOTE — Patient Instructions (Addendum)
Continue antibiotic as prescribed (twice per day), will extend course of antibiotic for 3 more days. Restart acid blocker every day for now as heartburn can cause cough. Albuterol inhaler if needed for wheezing, but let me know if you have to use this. You should receive a call or letter about your lab results within the next week to 10 days.  Keep a record of your blood pressures outside of the office each day, and when upper number over 160 - restart your blood pressure medicine.   Return to the clinic or go to the nearest emergency room if any of your symptoms worsen or new symptoms occur. Recheck with Dr. Neva Seat next Wednesday night after 5pm.

## 2013-04-12 LAB — BASIC METABOLIC PANEL
BUN: 16 mg/dL (ref 6–23)
CO2: 27 mEq/L (ref 19–32)
Chloride: 102 mEq/L (ref 96–112)
Creat: 1.17 mg/dL (ref 0.50–1.35)
Glucose, Bld: 100 mg/dL — ABNORMAL HIGH (ref 70–99)
Potassium: 3.9 mEq/L (ref 3.5–5.3)

## 2013-04-14 NOTE — Discharge Summary (Signed)
Family Medicine Teaching Service  Discharge Note : Attending Jeff Phenix Vandermeulen MD Pager [phone] Inpatient Team Pager:  [phone]  I have reviewed this patient and the patient's chart and have discussed discharge planning with the resident at the time of discharge. I agree with the discharge plan as above.    

## 2013-04-17 ENCOUNTER — Ambulatory Visit (INDEPENDENT_AMBULATORY_CARE_PROVIDER_SITE_OTHER): Payer: Medicare Other | Admitting: Family Medicine

## 2013-04-17 VITALS — BP 130/66 | HR 69 | Temp 98.6°F | Resp 16 | Ht 67.5 in | Wt 188.0 lb

## 2013-04-17 DIAGNOSIS — A419 Sepsis, unspecified organism: Secondary | ICD-10-CM

## 2013-04-17 DIAGNOSIS — N12 Tubulo-interstitial nephritis, not specified as acute or chronic: Secondary | ICD-10-CM

## 2013-04-17 DIAGNOSIS — N390 Urinary tract infection, site not specified: Secondary | ICD-10-CM

## 2013-04-17 DIAGNOSIS — R05 Cough: Secondary | ICD-10-CM

## 2013-04-17 DIAGNOSIS — I10 Essential (primary) hypertension: Secondary | ICD-10-CM

## 2013-04-17 LAB — POCT URINALYSIS DIPSTICK
Leukocytes, UA: NEGATIVE
Nitrite, UA: NEGATIVE
Protein, UA: NEGATIVE
Urobilinogen, UA: 0.2
pH, UA: 5.5

## 2013-04-17 LAB — POCT UA - MICROSCOPIC ONLY
Casts, Ur, LPF, POC: NEGATIVE
Crystals, Ur, HPF, POC: NEGATIVE
Epithelial cells, urine per micros: NEGATIVE
RBC, urine, microscopic: NEGATIVE
Yeast, UA: NEGATIVE

## 2013-04-17 NOTE — Patient Instructions (Addendum)
Blood pressure - continue to hold medicine.  But if blood pressure remains above 140/90 - start back at 1/2 tablet each day.  continue antibiotic.  If any fever, abdominal pain, or any worsening of symptoms - return here or emergency room.  We will check on the status of the heart echocardiogram/scan.  If any chest pain, or any increased shortness of breath or cough - go to an emergency room.

## 2013-04-17 NOTE — Progress Notes (Signed)
Subjective:    Patient ID: Phillip Oliphant., male    DOB: Sep 26, 1940, 72 y.o.   MRN: [id number]  HPI Phillip Dusza. is a 72 y.o. male See 04/11/13 ov. Recent hospitalization for sepsis, secondary to Proteus Mirabilis pyelonephritis.  Admitted 04/05/13 -04/08/13. Hypotensive in ED, admitted to ICU, treated with Levaquin, aztreonam.  Changed antibiotic to PO bactrim DS twice per day, but had been taking 1/2 pill twice per day d/t misunderstanding in dose. Added 3 more days of Septra BID. No fever. 2 more days of antibiotics. No further urinary urgency. On new medicine from urology - helps with prostate - unknown name. Feeling well.  HTN - held meds since hypotensive in hospital. Plan to restart med if BP over 160. Plan to restart usual antihypertensive once BP above 160 systolic.  Has not restarted blood pressure medicine.  130-140/70-80 home readings.   Cough- nighttime only for about a week at last ov, LPR with hx of GERD vs asthmatic/COPD with tobacco use, less likely CHF. Improved cxr noted in office last ov.  Restarted PPI QD, albuterol if needed, rtc precautions.  Taking prevacid most days now, cough is gone. CXR improved last ov, but still with resolving pulmonary edema:  8/28/14CXR:  Findings: Grossly unchanged cardiac silhouette and mediastinal  contours. Overall improved aeration of the lungs with persistent  mild interstitial thickening. The lungs again remain  hyperexpanded. No focal airspace opacities. No pleural effusion  or pneumothorax. No acute osseous abnormality.  IMPRESSION:  1. Improved pulmonary edema without acute cardiopulmonary disease.  2. Persistent hyperexpansion and bronchitic change.  Clinically significant discrepancy from primary report, if  provided: None    Review of Systems No fever, abd pain or dysuria. Cough resolved - other as above.     Objective:   Physical Exam  Vitals reviewed. Constitutional: Phillip Maynard is oriented to person, place, and time. Phillip Maynard  appears well-developed and well-nourished.  HENT:  Head: Normocephalic and atraumatic.  Eyes: EOM are normal. Pupils are equal, round, and reactive to light.  Neck: No JVD present. Carotid bruit is not present.  Cardiovascular: Normal rate, regular rhythm and normal heart sounds.   No murmur heard. Pulmonary/Chest: Effort normal and breath sounds normal.  initially faint coarse bs at base of lung - clear on recheck normal effort.   Musculoskeletal: Phillip Maynard exhibits no edema.  Neurological: Phillip Maynard is alert and oriented to person, place, and time.  Skin: Skin is warm and dry.  Psychiatric: Phillip Maynard has a normal mood and affect.   Results for orders placed in visit on 04/17/13  POCT UA - MICROSCOPIC ONLY      Result Value Range   WBC, Ur, HPF, POC 0-3     RBC, urine, microscopic neg     Bacteria, U Microscopic neg     Mucus, UA neg     Epithelial cells, urine per micros neg     Crystals, Ur, HPF, POC neg     Casts, Ur, LPF, POC neg     Yeast, UA neg    POCT URINALYSIS DIPSTICK      Result Value Range   Color, UA yellow     Clarity, UA clear     Glucose, UA neg     Bilirubin, UA neg     Ketones, UA trace     Spec Grav, UA >=1.030     Blood, UA neg     pH, UA 5.5     Protein, UA neg  Urobilinogen, UA 0.2     Nitrite, UA neg     Leukocytes, UA Negative         Assessment & Plan:  Phillip Wilkie. is a 72 y.o. male Pyelonephritis, s/p sepsis - much improved - continue bactrim until course completed. RTC precautions. Also to bring name and dose of medicine prescribed by urology to next ov.   Cough - resolved. Prior pulmonary edema , but improved. Echo pending. ddx also incudes obstructive air disease based on prior XR - albuterol rx last ov.   HTN (hypertension) - stable at present off meds. If remaining over 140/90 - restart prior regimen but at 1/2 dose intially. Ideally would restart at some pont for nephroprotection with underlying DM2.   Patient Instructions  Blood pressure -  continue to hold medicine.  But if blood pressure remains above 140/90 - start back at 1/2 tablet each day.  continue antibiotic.  If any fever, abdominal pain, or any worsening of symptoms - return here or emergency room.  We will check on the status of the heart echocardiogram/scan.  If any chest pain, or any increased shortness of breath or cough - go to an emergency room.

## 2013-04-18 ENCOUNTER — Telehealth: Payer: Self-pay | Admitting: Radiology

## 2013-04-18 NOTE — Telephone Encounter (Signed)
Message copied by Caffie Damme on Thu Apr 18, 2013  1:55 PM ------      Message from: Neva Seat, [place] R      Created: Wed Apr 17, 2013  9:05 PM       Please check into status of echo.  Not sure if this has been scheduled. Thanks.  ------

## 2013-04-22 ENCOUNTER — Encounter: Payer: Medicare Other | Admitting: Family Medicine

## 2013-04-25 NOTE — Telephone Encounter (Signed)
We are still waiting for precert/ this is in process.

## 2013-04-29 ENCOUNTER — Encounter: Payer: Medicare Other | Admitting: Family Medicine

## 2013-05-16 ENCOUNTER — Ambulatory Visit (HOSPITAL_COMMUNITY): Payer: Medicare Other | Attending: Cardiology

## 2013-05-16 ENCOUNTER — Other Ambulatory Visit (HOSPITAL_COMMUNITY): Payer: Self-pay | Admitting: Family Medicine

## 2013-05-16 DIAGNOSIS — E119 Type 2 diabetes mellitus without complications: Secondary | ICD-10-CM | POA: Insufficient documentation

## 2013-05-16 DIAGNOSIS — F17200 Nicotine dependence, unspecified, uncomplicated: Secondary | ICD-10-CM | POA: Insufficient documentation

## 2013-05-16 DIAGNOSIS — I079 Rheumatic tricuspid valve disease, unspecified: Secondary | ICD-10-CM | POA: Insufficient documentation

## 2013-05-16 DIAGNOSIS — Z8709 Personal history of other diseases of the respiratory system: Secondary | ICD-10-CM

## 2013-05-16 DIAGNOSIS — I10 Essential (primary) hypertension: Secondary | ICD-10-CM | POA: Insufficient documentation

## 2013-05-16 DIAGNOSIS — B1920 Unspecified viral hepatitis C without hepatic coma: Secondary | ICD-10-CM | POA: Insufficient documentation

## 2013-05-16 DIAGNOSIS — R609 Edema, unspecified: Secondary | ICD-10-CM

## 2013-05-16 NOTE — Progress Notes (Signed)
Echocardiogram performed.  

## 2013-05-27 ENCOUNTER — Encounter: Payer: Self-pay | Admitting: Family Medicine

## 2013-05-27 ENCOUNTER — Ambulatory Visit (INDEPENDENT_AMBULATORY_CARE_PROVIDER_SITE_OTHER): Payer: Medicare Other | Admitting: Family Medicine

## 2013-05-27 VITALS — BP 154/82 | HR 64 | Temp 98.1°F | Resp 16 | Ht 67.5 in | Wt 188.9 lb

## 2013-05-27 DIAGNOSIS — Z139 Encounter for screening, unspecified: Secondary | ICD-10-CM

## 2013-05-27 DIAGNOSIS — I10 Essential (primary) hypertension: Secondary | ICD-10-CM

## 2013-05-27 DIAGNOSIS — Z23 Encounter for immunization: Secondary | ICD-10-CM

## 2013-05-27 DIAGNOSIS — Z0000 Encounter for general adult medical examination without abnormal findings: Secondary | ICD-10-CM

## 2013-05-27 DIAGNOSIS — Z8709 Personal history of other diseases of the respiratory system: Secondary | ICD-10-CM

## 2013-05-27 LAB — POCT URINALYSIS DIPSTICK
Glucose, UA: NEGATIVE
Ketones, UA: NEGATIVE
Nitrite, UA: NEGATIVE
Spec Grav, UA: 1.025
Urobilinogen, UA: 0.2
pH, UA: 5.5

## 2013-05-27 LAB — IFOBT (OCCULT BLOOD): IFOBT: NEGATIVE

## 2013-05-27 MED ORDER — LOSARTAN POTASSIUM 50 MG PO TABS: 50.0000 mg | ORAL_TABLET | Freq: Every day | ORAL | Status: DC

## 2013-05-27 NOTE — Patient Instructions (Addendum)
[facility] offers smoking cessation clinics. Registration is required. To register call [phone] or register online at [email or web address]. Blood pressure medicine changed to lower dose. Take new medicine once per day, Keep a record of your blood pressures outside of the office and bring them to the next office visit in the next 1 month. If lightheaded, dizzy, or low blood pressure readings - stop this medicine and return here or emergency room right away. If blood pressure above 160 - return to discuss sooner.  Plan on recheck FASTING blood work at next visit in 1 month.  Schedule the dentist and eyecare visit as discussed. Check with your insurance about coverage for these.  Return to the clinic or go to the nearest emergency room if any of your symptoms worsen or new symptoms occur.  Keeping you healthy  Get these tests  Blood pressure- Have your blood pressure checked once a year by your healthcare provider.  Normal blood pressure is 120/80  Weight- Have your body mass index (BMI) calculated to screen for obesity.  BMI is a measure of body fat based on height and weight. You can also calculate your own BMI at [email or web address].  Cholesterol- Have your cholesterol checked every year.  Diabetes- Have your blood sugar checked regularly if you have high blood pressure, high cholesterol, have a family history of diabetes or if you are overweight.  Screening for Colon Cancer- Colonoscopy starting at age 54.  Screening may begin sooner depending on your family history and other health conditions. Follow up colonoscopy as directed by your Gastroenterologist.  Screening for Prostate Cancer- Both blood work (PSA) and a rectal exam help screen for Prostate Cancer.  Screening begins at age 15 with African-American men and at age 18 with Caucasian men.  Screening may begin sooner depending on your family history.  Take these medicines  Aspirin- One aspirin daily can help prevent Heart disease  and Stroke.  Flu shot- Every fall.  Tetanus- Every 10 years.  Zostavax- Once after the age of 66 to prevent Shingles.  Pneumonia shot- Once after the age of 38; if you are younger than 38, ask your healthcare provider if you need a Pneumonia shot.  Take these steps  Don't smoke- If you do smoke, talk to your doctor about quitting.  For tips on how to quit, go to www.smokefree.gov or call 1-800-QUIT-NOW.  Be physically active- Exercise 5 days a week for at least 30 minutes.  If you are not already physically active start slow and gradually work up to 30 minutes of moderate physical activity.  Examples of moderate activity include walking briskly, mowing the yard, dancing, swimming, bicycling, etc.  Eat a healthy diet- Eat a variety of healthy food such as fruits, vegetables, low fat milk, low fat cheese, yogurt, lean meant, poultry, fish, beans, tofu, etc. For more information go to www.thenutritionsource.org  Drink alcohol in moderation- Limit alcohol intake to less than two drinks a day. Never drink and drive.  Dentist- Brush and floss twice daily; visit your dentist twice a year.  Depression- Your emotional health is as important as your physical health. If you're feeling down, or losing interest in things you would normally enjoy please talk to your healthcare provider.  Eye exam- Visit your eye doctor every year.  Safe sex- If you may be exposed to a sexually transmitted infection, use a condom.  Seat belts- Seat belts can save your life; always wear one.  Smoke/Carbon Monoxide detectors- These detectors need to be installed  on the appropriate level of your home.  Replace batteries at least once a year.  Skin cancer- When out in the sun, cover up and use sunscreen 15 SPF or higher.  Violence- If anyone is threatening you, please tell your healthcare provider.  Living Will/ Health care power of attorney- Speak with your healthcare provider and family.

## 2013-05-27 NOTE — Progress Notes (Signed)
  Subjective:    Patient ID: Phillip Maynard., male    DOB: 1940-08-20, 72 y.o.   MRN: [id number]  HPI    Review of Systems  Constitutional: Negative.   HENT: Negative.   Eyes: Negative.   Respiratory: Negative.   Cardiovascular: Negative.   Gastrointestinal: Negative.   Endocrine: Negative.   Genitourinary: Negative.   Musculoskeletal: Negative.   Skin: Negative.   Allergic/Immunologic: Negative.   Neurological: Negative.   Hematological: Negative.   Psychiatric/Behavioral: Negative.        Objective:   Physical Exam        Assessment & Plan:

## 2013-05-27 NOTE — Progress Notes (Signed)
Subjective:    Patient ID: Phillip Oliphant., male    DOB: [birth year], 72 y.o.   MRN: [id number]  HPI Phillip Schweer. is a 72 y.o. male  Here for annual medicare physical  Depression screen: HANDS - 0 positive responses.  Vision noted: 20/20 with correction, optho - wears glasses, last ov 2 years ago - will schedule.  Dentist: last seen 2 years ago - will schedule. Colonoscopy: last year? Told was ok.  PSA: followed for Dr, Brunilda Payor for BPH. Checked PSA few weeks ago. Next appt in 6 months. On unknown medicine for prostate.  Advanced directives: discussed with wife, full code.  Cutting back on smoking - now smoking 1/4 pack per day.  Pneumovax 04/2011. Lipid panel June 2014.  Had shingles disease years ago. Discussed shingles vaccine - declined today.   Flu vaccine given today.  Td within 10 years.   HTN - only taking meds when elevated. held meds since hypotensive in hospital - see prior ov. Plan to restart usual antihypertensive once BP above 160 systolic.  Only had to take meds few times last week with few elevated readings. Not lightheaded when taking med. No chest pains, no edema, no dyspnea, urinating normally. Last taken antihypertensive last week. Home BP's 145/77, 130/70. Was started on med for prostate by urology.   Hx of pulmonary edema, thought to be fluid overload in hospital at time of pyelonephritis/sepsis.   Echo on 05/16/13: Study Conclusions - Left ventricle: The cavity size was normal. There was mild concentric hypertrophy. Systolic function was normal. The estimated ejection fraction was in the range of 60% to 65%. Wall motion was normal; there were no regional wall motion abnormalities. Doppler parameters are consistent with abnormal left ventricular relaxation (grade 1 diastolic dysfunction). Doppler parameters are consistent with elevated ventricular end-diastolic filling pressure. - Left atrium: The atrium was moderately dilated. - Atrial septum: No defect  or patent foramen ovale was identified. Transthoracic echocardiography. M-mode, complete 2D, spectral Doppler, and color Doppler. Height: Height: 172.7cm. Height: 68in. Weight: Weight: 85.7kg. Weight: 188.6lb. Body mass index: BMI: 28.7kg/m^2. Body surface area: BSA: 51m^2. Blood pressure: 140/82. Patient status: Outpatient. Location: [facility]   Denies cough, chest pain, dyspnea. Pedal edema.    Past Medical History  Diagnosis Date  . Blood transfusion 1970's  . Bronchitis   . Hepatitis C   . Black lung   . Hypertension   . High cholesterol   . Type II diabetes mellitus   . GERD (gastroesophageal reflux disease)   . Asthma     "when I was a boy" (04/08/2013)   Past Surgical History  Procedure Laterality Date  . Tonsillectomy    . Incision and drainage of wound Right 1970's    "leg" (04/08/2013)  . Liver biopsy  ~ 2011   Allergies  Allergen Reactions  . Penicillins Anaphylaxis  . Shellfish Allergy Anaphylaxis   Prior to Admission medications   Medication Sig Start Date End Date Taking? Authorizing Provider  lansoprazole (PREVACID) 30 MG capsule Take 30 mg by mouth daily as needed (for GERD).   Yes Historical Provider, MD  losartan-hydrochlorothiazide (HYZAAR) 100-12.5 MG per tablet Take 1 tablet by mouth daily.   Yes Historical Provider, MD  albuterol (PROVENTIL HFA;VENTOLIN HFA) 108 (90 BASE) MCG/ACT inhaler Inhale 2 puffs into the lungs every 6 (six) hours as needed for wheezing. 04/11/13   Shade Flood, MD  sulfamethoxazole-trimethoprim (BACTRIM DS) 800-160 MG per tablet Take 1 tablet by mouth 2 (  two) times daily. After completion of other prescription (3 additional days of treatment) 04/11/13   Shade Flood, MD   History   Social History  . Marital Status: Married    Spouse Name: N/A    Number of Children: N/A  . Years of Education: N/A   Occupational History  . truck driver    Social History Main Topics  . Smoking status: Current Every Day Smoker --  1.00 packs/day for 45 years    Types: Cigarettes  . Smokeless tobacco: Never Used  . Alcohol Use: Yes     Comment: 04/08/2013 "haven't drank nothing in > 3 yr; before then I'd have at least 1 pint/day"  . Drug Use: No  . Sexual Activity: Yes   Other Topics Concern  . Not on file   Social History Narrative  . No narrative on file    Review of Systems  Genitourinary: Negative for urgency, frequency, hematuria and difficulty urinating.   13 point review of systems per patient health survey noted.  Negative other than as indicated on reviewed nursing note.      Objective:   Physical Exam  Vitals reviewed. Constitutional: He is oriented to person, place, and time. He appears well-developed and well-nourished.  HENT:  Head: Normocephalic and atraumatic.  Right Ear: External ear normal.  Left Ear: External ear normal.  Mouth/Throat: Oropharynx is clear and moist.  Eyes: Conjunctivae and EOM are normal. Pupils are equal, round, and reactive to light.  Neck: Normal range of motion. Neck supple. No thyromegaly present.  Cardiovascular: Normal rate, regular rhythm, normal heart sounds and intact distal pulses.   Pulmonary/Chest: Effort normal and breath sounds normal. No respiratory distress. He has no wheezes.  Abdominal: Soft. He exhibits no distension. There is no tenderness. Hernia confirmed negative in the right inguinal area and confirmed negative in the left inguinal area.  Musculoskeletal: Normal range of motion. He exhibits no edema and no tenderness.  Lymphadenopathy:    He has no cervical adenopathy.  Neurological: He is alert and oriented to person, place, and time. He has normal reflexes.  Skin: Skin is warm and dry.  Psychiatric: He has a normal mood and affect. His behavior is normal. Thought content normal.   Results for orders placed in visit on 05/27/13  POCT URINALYSIS DIPSTICK      Result Value Range   Color, UA yellow     Clarity, UA clear     Glucose, UA neg      Bilirubin, UA neg     Ketones, UA neg     Spec Grav, UA 1.025     Blood, UA neg     pH, UA 5.5     Protein, UA trace     Urobilinogen, UA 0.2     Nitrite, UA neg     Leukocytes, UA Negative    IFOBT (OCCULT BLOOD)      Result Value Range   IFOBT Negative        Assessment & Plan:  Phillip Strohmeier. is a 72 y.o. male Routine general medical examination at a health care facility - Medicare physical  - screening as in HPI. Declined zostavax.  Flu vaccine given today. Hemosure negative. Anticipatory guidance given.    Need for prophylactic vaccination and inoculation against influenza - Plan: Flu Vaccine QUAD 36+ mos IM given.   Screening for unspecified condition - Plan: IFOBT POC (occult bld, rslt in office) - negative.  HTN (hypertension) - prior  hypotension in hospital, but BP's elevating again.  New med from urology may be alpho blocker, which could contribute to better control.  Change from losartan/hct combo to lower dose  losartan (COZAAR) 50 MG tablet QD, check home Bp's and if low or high - to rtc.  orthostatic precautions given.   History of pulmonary edema, Echo ok as above. Discussed in office.  Suspect fluid overload in hospital, now resolved.   Hyperglycemia - on prior labs. Plan on fasting bloodwork next ov in 1 month.   Meds ordered this encounter  Medications  . DISCONTD: losartan-hydrochlorothiazide (HYZAAR) 100-12.5 MG per tablet    Sig: Take 1 tablet by mouth daily.  Marland Kitchen losartan (COZAAR) 50 MG tablet    Sig: Take 1 tablet (50 mg total) by mouth daily.    Dispense:  90 tablet    Refill:  3   Patient Instructions  [facility] offers smoking cessation clinics. Registration is required. To register call [phone] or register online at [email or web address]. Blood pressure medicine changed to lower dose. Take new medicine once per day, Keep a record of your blood pressures outside of the office and bring them to the next office visit in the next 1 month. If  lightheaded, dizzy, or low blood pressure readings - stop this medicine and return here or emergency room right away. If blood pressure above 160 - return to discuss sooner.  Plan on recheck FASTING blood work at next visit in 1 month.  Schedule the dentist and eyecare visit as discussed. Check with your insurance about coverage for these.  Return to the clinic or go to the nearest emergency room if any of your symptoms worsen or new symptoms occur.  Keeping you healthy  Get these tests  Blood pressure- Have your blood pressure checked once a year by your healthcare provider.  Normal blood pressure is 120/80  Weight- Have your body mass index (BMI) calculated to screen for obesity.  BMI is a measure of body fat based on height and weight. You can also calculate your own BMI at [email or web address].  Cholesterol- Have your cholesterol checked every year.  Diabetes- Have your blood sugar checked regularly if you have high blood pressure, high cholesterol, have a family history of diabetes or if you are overweight.  Screening for Colon Cancer- Colonoscopy starting at age 48.  Screening may begin sooner depending on your family history and other health conditions. Follow up colonoscopy as directed by your Gastroenterologist.  Screening for Prostate Cancer- Both blood work (PSA) and a rectal exam help screen for Prostate Cancer.  Screening begins at age 54 with African-American men and at age 2 with Caucasian men.  Screening may begin sooner depending on your family history.  Take these medicines  Aspirin- One aspirin daily can help prevent Heart disease and Stroke.  Flu shot- Every fall.  Tetanus- Every 10 years.  Zostavax- Once after the age of 31 to prevent Shingles.  Pneumonia shot- Once after the age of 36; if you are younger than 67, ask your healthcare provider if you need a Pneumonia shot.  Take these steps  Don't smoke- If you do smoke, talk to your doctor about quitting.   For tips on how to quit, go to www.smokefree.gov or call 1-800-QUIT-NOW.  Be physically active- Exercise 5 days a week for at least 30 minutes.  If you are not already physically active start slow and gradually work up to 30 minutes of moderate physical activity.  Examples of moderate activity  include walking briskly, mowing the yard, dancing, swimming, bicycling, etc.  Eat a healthy diet- Eat a variety of healthy food such as fruits, vegetables, low fat milk, low fat cheese, yogurt, lean meant, poultry, fish, beans, tofu, etc. For more information go to www.thenutritionsource.org  Drink alcohol in moderation- Limit alcohol intake to less than two drinks a day. Never drink and drive.  Dentist- Brush and floss twice daily; visit your dentist twice a year.  Depression- Your emotional health is as important as your physical health. If you're feeling down, or losing interest in things you would normally enjoy please talk to your healthcare provider.  Eye exam- Visit your eye doctor every year.  Safe sex- If you may be exposed to a sexually transmitted infection, use a condom.  Seat belts- Seat belts can save your life; always wear one.  Smoke/Carbon Monoxide detectors- These detectors need to be installed on the appropriate level of your home.  Replace batteries at least once a year.  Skin cancer- When out in the sun, cover up and use sunscreen 15 SPF or higher.  Violence- If anyone is threatening you, please tell your healthcare provider.  Living Will/ Health care power of attorney- Speak with your healthcare provider and family.

## 2013-05-28 NOTE — Progress Notes (Signed)
Appointment scheduled for 11/10 at 2 pm.

## 2013-06-24 ENCOUNTER — Ambulatory Visit (INDEPENDENT_AMBULATORY_CARE_PROVIDER_SITE_OTHER): Payer: Medicare Other | Admitting: Family Medicine

## 2013-06-24 ENCOUNTER — Encounter: Payer: Self-pay | Admitting: Family Medicine

## 2013-06-24 VITALS — BP 129/69 | HR 59 | Temp 97.8°F | Resp 16 | Ht 68.5 in | Wt 190.0 lb

## 2013-06-24 DIAGNOSIS — I10 Essential (primary) hypertension: Secondary | ICD-10-CM

## 2013-06-24 DIAGNOSIS — L259 Unspecified contact dermatitis, unspecified cause: Secondary | ICD-10-CM

## 2013-06-24 NOTE — Progress Notes (Signed)
Subjective:    Patient ID: Phillip Oliphant., male    DOB: 10/25/1940, 72 y.o.   MRN: [id number]  HPI Phillip Tanzi. is a 72 y.o. male  Here for follow up HTN. See prior ov's.  Off meds after hospitalization d/t relative lows/hypotnesion. Then recently restarted medication as elevating.  Changed from losartan/hct combo to lower dose  losartan (COZAAR) 50 MG tablet QD,  Here for follow up.   Home numbers:125/65, 128/80, 139/74, 149/85, 156/74, but from few weeks ago - batteries dead in machine.   Lab Results  Component Value Date   CREATININE 1.17 04/11/2013     Results for orders placed in visit on 05/27/13  POCT URINALYSIS DIPSTICK      Result Value Range   Color, UA yellow     Clarity, UA clear     Glucose, UA neg     Bilirubin, UA neg     Ketones, UA neg     Spec Grav, UA 1.025     Blood, UA neg     pH, UA 5.5     Protein, UA trace     Urobilinogen, UA 0.2     Nitrite, UA neg     Leukocytes, UA Negative    IFOBT (OCCULT BLOOD)      Result Value Range   IFOBT Negative       Rash on top of neck/chest for past few weeks. Noticed after taking lower dose of cozaar.  Has been using new perfumed men's soap around same amount of time? 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 Has used Triamcinolone cream at times.      Review of Systems  Constitutional: Negative for fatigue and unexpected weight change.  Eyes: Negative for visual disturbance.  Respiratory: Negative for cough, chest tightness and shortness of breath.   Cardiovascular: Negative for chest pain, palpitations and leg swelling.  Gastrointestinal: Negative for abdominal pain and blood in stool.  Neurological: Negative for dizziness, light-headedness and headaches.       Objective:   Physical Exam  Vitals reviewed. Constitutional: He is oriented to person, place, and time. He appears well-developed and well-nourished.  HENT:  Head: Normocephalic and atraumatic.  Eyes: EOM  are normal. Pupils are equal, round, and reactive to light.  Neck: No JVD present. Carotid bruit is not present.  Cardiovascular: Normal rate, regular rhythm and normal heart sounds.   No murmur heard. Pulmonary/Chest: Effort normal and breath sounds normal. He has no rales.  Abdominal: Soft. There is no tenderness.  Musculoskeletal: He exhibits no edema.  Neurological: He is alert and oriented to person, place, and time.  Skin: Skin is warm and dry.  Few coalescent excoriated macules/patches on upper chest only.   Psychiatric: He has a normal mood and affect.   Filed Vitals:   06/24/13 1414  BP: 129/69  Pulse: 59  Temp: 97.8 F (36.6 C)  Resp: 16  Height: 5' 8.5" (1.74 m)  Weight: 190 lb (86.183 kg)      Assessment & Plan:   Phillip Donahoe. is a 72 y.o. male Contact dermatitis - ? Soap vs wool vs detergent.  Instructions below, topical TAC BID prn - has rx already. Doubt med related, but rtc if not improving next few weeks.   HTN (hypertension) - controlled here, suspect meter running high. No change in meds for now, plan on creatinine at follow up in 4-6 weeks.    Meds  ordered this encounter  Medications  . triamcinolone cream (KENALOG) 0.1 %    Sig: Apply 1 application topically 2 (two) times daily.  . tamsulosin (FLOMAX) 0.4 MG CAPS capsule    Sig: Take 0.4 mg by mouth.   Patient Instructions  Change to free/allergy free detergent, dove soap, avoid wool clothing that touches skin. Ok to apply triamcinolone cream twice per day if needed. Recheck rash in next few weeks if not improving. Return to the clinic or go to the nearest emergency room if any of your symptoms worsen or new symptoms occur. Blood pressure ok here. Keep a record of your blood pressures outside of the office and bring them AND your machine to the next office visit in 4-6 weeks. Plan on blood work then and can discuss tremor then - return sooner if this worsens.    Contact Dermatitis Contact  dermatitis is a reaction to certain substances that touch the skin. Contact dermatitis can be either irritant contact dermatitis or allergic contact dermatitis. Irritant contact dermatitis does not require previous exposure to the substance for a reaction to occur.Allergic contact dermatitis only occurs if you have been exposed to the substance before. Upon a repeat exposure, your body reacts to the substance.  CAUSES  Many substances can cause contact dermatitis. Irritant dermatitis is most commonly caused by repeated exposure to mildly irritating substances, such as:  Makeup.  Soaps.  Detergents.  Bleaches.  Acids.  Metal salts, such as nickel. Allergic contact dermatitis is most commonly caused by exposure to:  Poisonous plants.  Chemicals (deodorants, shampoos).  Jewelry.  Latex.  Neomycin in triple antibiotic cream.  Preservatives in products, including clothing. SYMPTOMS  The area of skin that is exposed may develop:  Dryness or flaking.  Redness.  Cracks.  Itching.  Pain or a burning sensation.  Blisters. With allergic contact dermatitis, there may also be swelling in areas such as the eyelids, mouth, or genitals.  DIAGNOSIS  Your caregiver can usually tell what the problem is by doing a physical exam. In cases where the cause is uncertain and an allergic contact dermatitis is suspected, a patch skin test may be performed to help determine the cause of your dermatitis. TREATMENT Treatment includes protecting the skin from further contact with the irritating substance by avoiding that substance if possible. Barrier creams, powders, and gloves may be helpful. Your caregiver may also recommend:  Steroid creams or ointments applied 2 times daily. For best results, soak the rash area in cool water for 20 minutes. Then apply the medicine. Cover the area with a plastic wrap. You can store the steroid cream in the refrigerator for a "chilly" effect on your rash. That  may decrease itching. Oral steroid medicines may be needed in more severe cases.  Antibiotics or antibacterial ointments if a skin infection is present.  Antihistamine lotion or an antihistamine taken by mouth to ease itching.  Lubricants to keep moisture in your skin.  Burow's solution to reduce redness and soreness or to dry a weeping rash. Mix one packet or tablet of solution in 2 cups cool water. Dip a clean washcloth in the mixture, wring it out a bit, and put it on the affected area. Leave the cloth in place for 30 minutes. Do this as often as possible throughout the day.  Taking several cornstarch or baking soda baths daily if the area is too large to cover with a washcloth. Harsh chemicals, such as alkalis or acids, can cause skin damage that  is like a burn. You should flush your skin for 15 to 20 minutes with cold water after such an exposure. You should also seek immediate medical care after exposure. Bandages (dressings), antibiotics, and pain medicine may be needed for severely irritated skin.  HOME CARE INSTRUCTIONS  Avoid the substance that caused your reaction.  Keep the area of skin that is affected away from hot water, soap, sunlight, chemicals, acidic substances, or anything else that would irritate your skin.  Do not scratch the rash. Scratching may cause the rash to become infected.  You may take cool baths to help stop the itching.  Only take over-the-counter or prescription medicines as directed by your caregiver.  See your caregiver for follow-up care as directed to make sure your skin is healing properly. SEEK MEDICAL CARE IF:   Your condition is not better after 3 days of treatment.  You seem to be getting worse.  You see signs of infection such as swelling, tenderness, redness, soreness, or warmth in the affected area.  You have any problems related to your medicines. Document Released: 07/29/2000 Document Revised: 10/24/2011 Document Reviewed:  01/04/2011 [facility] Patient Information 2014 [place], [place].

## 2013-06-24 NOTE — Patient Instructions (Signed)
Change to free/allergy free detergent, dove soap, avoid wool clothing that touches skin. Ok to apply triamcinolone cream twice per day if needed. Recheck rash in next few weeks if not improving. Return to the clinic or go to the nearest emergency room if any of your symptoms worsen or new symptoms occur. Blood pressure ok here. Keep a record of your blood pressures outside of the office and bring them AND your machine to the next office visit in 4-6 weeks. Plan on blood work then and can discuss tremor then - return sooner if this worsens.    Contact Dermatitis Contact dermatitis is a reaction to certain substances that touch the skin. Contact dermatitis can be either irritant contact dermatitis or allergic contact dermatitis. Irritant contact dermatitis does not require previous exposure to the substance for a reaction to occur.Allergic contact dermatitis only occurs if you have been exposed to the substance before. Upon a repeat exposure, your body reacts to the substance.  CAUSES  Many substances can cause contact dermatitis. Irritant dermatitis is most commonly caused by repeated exposure to mildly irritating substances, such as:  Makeup.  Soaps.  Detergents.  Bleaches.  Acids.  Metal salts, such as nickel. Allergic contact dermatitis is most commonly caused by exposure to:  Poisonous plants.  Chemicals (deodorants, shampoos).  Jewelry.  Latex.  Neomycin in triple antibiotic cream.  Preservatives in products, including clothing. SYMPTOMS  The area of skin that is exposed may develop:  Dryness or flaking.  Redness.  Cracks.  Itching.  Pain or a burning sensation.  Blisters. With allergic contact dermatitis, there may also be swelling in areas such as the eyelids, mouth, or genitals.  DIAGNOSIS  Your caregiver can usually tell what the problem is by doing a physical exam. In cases where the cause is uncertain and an allergic contact dermatitis is suspected, a patch  skin test may be performed to help determine the cause of your dermatitis. TREATMENT Treatment includes protecting the skin from further contact with the irritating substance by avoiding that substance if possible. Barrier creams, powders, and gloves may be helpful. Your caregiver may also recommend:  Steroid creams or ointments applied 2 times daily. For best results, soak the rash area in cool water for 20 minutes. Then apply the medicine. Cover the area with a plastic wrap. You can store the steroid cream in the refrigerator for a "chilly" effect on your rash. That may decrease itching. Oral steroid medicines may be needed in more severe cases.  Antibiotics or antibacterial ointments if a skin infection is present.  Antihistamine lotion or an antihistamine taken by mouth to ease itching.  Lubricants to keep moisture in your skin.  Burow's solution to reduce redness and soreness or to dry a weeping rash. Mix one packet or tablet of solution in 2 cups cool water. Dip a clean washcloth in the mixture, wring it out a bit, and put it on the affected area. Leave the cloth in place for 30 minutes. Do this as often as possible throughout the day.  Taking several cornstarch or baking soda baths daily if the area is too large to cover with a washcloth. Harsh chemicals, such as alkalis or acids, can cause skin damage that is like a burn. You should flush your skin for 15 to 20 minutes with cold water after such an exposure. You should also seek immediate medical care after exposure. Bandages (dressings), antibiotics, and pain medicine may be needed for severely irritated skin.  HOME CARE INSTRUCTIONS  Avoid the substance that caused your reaction.  Keep the area of skin that is affected away from hot water, soap, sunlight, chemicals, acidic substances, or anything else that would irritate your skin.  Do not scratch the rash. Scratching may cause the rash to become infected.  You may take cool baths  to help stop the itching.  Only take over-the-counter or prescription medicines as directed by your caregiver.  See your caregiver for follow-up care as directed to make sure your skin is healing properly. SEEK MEDICAL CARE IF:   Your condition is not better after 3 days of treatment.  You seem to be getting worse.  You see signs of infection such as swelling, tenderness, redness, soreness, or warmth in the affected area.  You have any problems related to your medicines. Document Released: 07/29/2000 Document Revised: 10/24/2011 Document Reviewed: 01/04/2011 [facility] Patient Information 2014 [place], [place].

## 2013-07-29 ENCOUNTER — Encounter: Payer: Self-pay | Admitting: Family Medicine

## 2013-07-29 ENCOUNTER — Ambulatory Visit (INDEPENDENT_AMBULATORY_CARE_PROVIDER_SITE_OTHER): Payer: Medicare Other | Admitting: Family Medicine

## 2013-07-29 VITALS — BP 150/70 | HR 57 | Temp 97.9°F | Resp 16 | Ht 67.75 in | Wt 192.4 lb

## 2013-07-29 DIAGNOSIS — E119 Type 2 diabetes mellitus without complications: Secondary | ICD-10-CM

## 2013-07-29 DIAGNOSIS — R739 Hyperglycemia, unspecified: Secondary | ICD-10-CM

## 2013-07-29 DIAGNOSIS — R209 Unspecified disturbances of skin sensation: Secondary | ICD-10-CM

## 2013-07-29 DIAGNOSIS — R200 Anesthesia of skin: Secondary | ICD-10-CM

## 2013-07-29 DIAGNOSIS — I10 Essential (primary) hypertension: Secondary | ICD-10-CM

## 2013-07-29 LAB — POCT GLYCOSYLATED HEMOGLOBIN (HGB A1C): Hemoglobin A1C: 6.4

## 2013-07-29 MED ORDER — LOSARTAN POTASSIUM-HCTZ 100-12.5 MG PO TABS: 0.5000 | ORAL_TABLET | Freq: Every day | ORAL | Status: DC

## 2013-07-29 MED ORDER — LANCETS MISC. MISC: Status: DC

## 2013-07-29 MED ORDER — GLUCOSE BLOOD VI STRP: ORAL_STRIP | Status: DC

## 2013-07-29 NOTE — Patient Instructions (Addendum)
Ok to continue 1/2 of combo medicine each day - make sure this matches what you were taking at home. If any return of arm numbness, weakness or any new neurologic symptoms - call 911 or go to emergency room.  You should receive a call or letter about your lab results within the next week to 10 days.  Keep a record of your blood pressures and blood sugars outside of the office and bring them to the next office visit. Start back on Aspirin 81mg  each day.

## 2013-07-29 NOTE — Progress Notes (Signed)
Subjective:    Patient ID: Phillip Oliphant., male    DOB: [birth year], 72 y.o.   MRN: [id number]  HPI Phillip Luhn. is a 72 y.o. male  Here for follow up on blood pressure - last ov on 06/24/13. Off meds after hospitalization d/t relative lows/hypotnesion. Then recently restarted medication as elevating.  Changed from losartan/hct combo to lower dose losartan (COZAAR) 50 MG tablet QD Home numbers at last ov - 125/65, 128/80, 139/74, 149/85, 156/74,- but BP 129/69 in office.   Home BP running in 140's/74.  Taking losartan 50mg  qd. Funny sensation in R arm about 10 days ago at home. Different than in past when felt sensation in arm few days after not lifting weights - this felt like asleep/numb all the way to R hand, resolved that evening with exercising that night. No HA, no slurred speech, no pain, no N/V, no chest pain. No dyspnea. Called 911. Checked sugar and blood pressure  - 147/80. Was not transported to ER - given option after testing there.  Told exam was normal. Was told to add back another medicine?  Home blood pressures - 128/77 in am, 137/80, 134/74, 120/66 in then morning. Started taking 1/2 of previous combo medicine in place of losartan 50mg  (100/12.5mg  losartan hct) once per day. Feels like this dose is controlling BP's better.   No hx of PUD or known contraindications to ASA. Not taking recently.   Dm2 - diet controlled - A1c 6.4 in 01/2013.  Review of Systems  Constitutional: Negative for fatigue and unexpected weight change.  Eyes: Negative for visual disturbance.  Respiratory: Negative for cough, chest tightness and shortness of breath.   Cardiovascular: Negative for chest pain, palpitations and leg swelling.  Gastrointestinal: Negative for abdominal pain and blood in stool.  Neurological: Negative for dizziness, light-headedness and headaches.       Objective:   Physical Exam  Vitals reviewed. Constitutional: He is oriented to person, place, and time. He  appears well-developed and well-nourished.  HENT:  Head: Normocephalic and atraumatic.  Eyes: EOM are normal. Pupils are equal, round, and reactive to light.  Neck: No JVD present. Carotid bruit is not present.  Cardiovascular: Normal rate, regular rhythm and normal heart sounds.   No murmur heard. Pulmonary/Chest: Effort normal and breath sounds normal. He has no rales.  Musculoskeletal: He exhibits no edema.  Neurological: He is alert and oriented to person, place, and time. He has normal strength. No cranial nerve deficit or sensory deficit. He displays a negative Romberg sign.  Nonfocal, no pronator drift. Normal heel to toe.   Skin: Skin is warm and dry.  Psychiatric: He has a normal mood and affect. His behavior is normal.   Filed Vitals:   07/29/13 1558  BP: 150/70  Pulse: 57  Temp: 97.9 F (36.6 C)  Resp: 16  mul  BP recheck L arm - 150/74.     Assessment & Plan:   Phillip Oyster. is a 72 y.o. male Type II or unspecified type diabetes mellitus without mention of complication, not stated as uncontrolled - Plan: [facility] Diabetes Foot Exam, Basic metabolic panel, HgB A1c pending. Diet controlled at present. Refilled lancets, testing strips for home monitoring.   Unspecified essential hypertension - Plan: Basic metabolic panel to recheck creatinine. Overall home numbers appear controlled on his currnet regime of 1/2 of Hyzaar 100/25mg  qd. This would give him same dose of losartan as prior, but small dose of hctz. Can stay  on this dose, but to continue to monitor BP. Refilled losartan-hydrochlorothiazide (HYZAAR) 100-12.5 MG per tablet  Right arm numbness - now resolved, and by report, EMS did not feel this was stroke sx's.  Resolved with exercise - ddx of msk source, cervical/discogenic? nonfocal neuro exam at present. As diabetic, and HTN, discussed these are RF's of CVD/stroke. Restart ASA QD, and if sx's recur - call 911/go to ER. Understanding expressed.    Hyperglycemia -  Plan: Lancets Misc. MISC, glucose blood test strip  Meds ordered this encounter  Medications  . losartan-hydrochlorothiazide (HYZAAR) 100-12.5 MG per tablet    Sig: Take 0.5 tablets by mouth daily.    Dispense:  90 tablet    Refill:  1  . Lancets Misc. MISC    Sig: Use for home glucose monitoring as directed    Dispense:  100 each    Refill:  3  . glucose blood test strip    Sig: Check home blood sugar once daily    Dispense:  100 each    Refill:  12    Patient Instructions  Ok to continue 1/2 of combo medicine each day - make sure this matches what you were taking at home. If any return of arm numbness, weakness or any new neurologic symptoms - call 911 or go to emergency room.  You should receive a call or letter about your lab results within the next week to 10 days.  Keep a record of your blood pressures and blood sugars outside of the office and bring them to the next office visit. Start back on Aspirin 81mg  each day.     Called Tuesday am about additional concern noted in CC section - R lower leg pain, as this was not discussed at ov. He stated it was sore on outside of lower R leg only when kneeling down on it, for past week or so. Not worsening, no calf pain or swelling. He planned on discussing next ov if still there. Advised to rtc to recheck this in next week if not improving -  Sooner if calf pain or swelling. Understanding expressed.

## 2013-07-30 LAB — BASIC METABOLIC PANEL
CO2: 26 mEq/L (ref 19–32)
Calcium: 9.7 mg/dL (ref 8.4–10.5)
Creat: 1.14 mg/dL (ref 0.50–1.35)
Glucose, Bld: 98 mg/dL (ref 70–99)
Sodium: 140 mEq/L (ref 135–145)

## 2013-12-02 ENCOUNTER — Ambulatory Visit (INDEPENDENT_AMBULATORY_CARE_PROVIDER_SITE_OTHER): Payer: Medicare Other | Admitting: Emergency Medicine

## 2013-12-02 ENCOUNTER — Ambulatory Visit: Payer: Medicare Other

## 2013-12-02 VITALS — BP 160/80 | HR 83 | Temp 97.8°F | Resp 16 | Ht 69.0 in | Wt 196.0 lb

## 2013-12-02 DIAGNOSIS — E119 Type 2 diabetes mellitus without complications: Secondary | ICD-10-CM

## 2013-12-02 DIAGNOSIS — M79609 Pain in unspecified limb: Secondary | ICD-10-CM

## 2013-12-02 DIAGNOSIS — R7309 Other abnormal glucose: Secondary | ICD-10-CM

## 2013-12-02 DIAGNOSIS — R739 Hyperglycemia, unspecified: Secondary | ICD-10-CM

## 2013-12-02 DIAGNOSIS — M79604 Pain in right leg: Secondary | ICD-10-CM

## 2013-12-02 DIAGNOSIS — I10 Essential (primary) hypertension: Secondary | ICD-10-CM

## 2013-12-02 DIAGNOSIS — M545 Low back pain, unspecified: Secondary | ICD-10-CM

## 2013-12-02 LAB — POCT CBC
Granulocyte percent: 56.6 %G (ref 37–80)
HCT, POC: 48 % (ref 43.5–53.7)
Hemoglobin: 15.2 g/dL (ref 14.1–18.1)
Lymph, poc: 2.6 (ref 0.6–3.4)
MCH, POC: 29.7 pg (ref 27–31.2)
MCHC: 31.7 g/dL — AB (ref 31.8–35.4)
MCV: 93.7 fL (ref 80–97)
MID (CBC): 0.3 (ref 0–0.9)
MPV: 10.7 fL (ref 0–99.8)
POC Granulocyte: 3.8 (ref 2–6.9)
POC LYMPH PERCENT: 38.5 %L (ref 10–50)
POC MID %: 4.9 % (ref 0–12)
Platelet Count, POC: 272 10*3/uL (ref 142–424)
RBC: 5.12 M/uL (ref 4.69–6.13)
RDW, POC: 14.1 %
WBC: 6.7 10*3/uL (ref 4.6–10.2)

## 2013-12-02 LAB — BASIC METABOLIC PANEL
BUN: 11 mg/dL (ref 6–23)
CO2: 30 meq/L (ref 19–32)
Calcium: 9.6 mg/dL (ref 8.4–10.5)
Chloride: 99 mEq/L (ref 96–112)
Creat: 1 mg/dL (ref 0.50–1.35)
Glucose, Bld: 95 mg/dL (ref 70–99)
POTASSIUM: 4.2 meq/L (ref 3.5–5.3)
Sodium: 138 mEq/L (ref 135–145)

## 2013-12-02 LAB — GLUCOSE, POCT (MANUAL RESULT ENTRY): POC Glucose: 97 mg/dl (ref 70–99)

## 2013-12-02 LAB — POCT GLYCOSYLATED HEMOGLOBIN (HGB A1C): Hemoglobin A1C: 6.3

## 2013-12-02 MED ORDER — TRAMADOL HCL 50 MG PO TABS: 50.0000 mg | ORAL_TABLET | Freq: Three times a day (TID) | ORAL | Status: DC | PRN

## 2013-12-02 MED ORDER — LANCETS MISC. MISC
Status: DC
Start: 2013-12-02 — End: 2013-12-17

## 2013-12-02 MED ORDER — GLUCOSE BLOOD VI STRP: ORAL_STRIP | Status: DC

## 2013-12-02 MED ORDER — MELOXICAM 15 MG PO TABS: 15.0000 mg | ORAL_TABLET | Freq: Every day | ORAL | Status: DC

## 2013-12-02 NOTE — Patient Instructions (Signed)
Back Pain, Adult Low back pain is very common. About 1 in 5 people have back pain.The cause of low back pain is rarely dangerous. The pain often gets better over time.About half of people with a sudden onset of back pain feel better in just 2 weeks. About 8 in 10 people feel better by 6 weeks.  CAUSES Some common causes of back pain include:  Strain of the muscles or ligaments supporting the spine.  Wear and tear (degeneration) of the spinal discs.  Arthritis.  Direct injury to the back. DIAGNOSIS Most of the time, the direct cause of low back pain is not known.However, back pain can be treated effectively even when the exact cause of the pain is unknown.Answering your caregiver's questions about your overall health and symptoms is one of the most accurate ways to make sure the cause of your pain is not dangerous. If your caregiver needs more information, he or she may order lab work or imaging tests (X-rays or MRIs).However, even if imaging tests show changes in your back, this usually does not require surgery. HOME CARE INSTRUCTIONS For many people, back pain returns.Since low back pain is rarely dangerous, it is often a condition that people can learn to manageon their own.   Remain active. It is stressful on the back to sit or stand in one place. Do not sit, drive, or stand in one place for more than 30 minutes at a time. Take short walks on level surfaces as soon as pain allows.Try to increase the length of time you walk each day.  Do not stay in bed.Resting more than 1 or 2 days can delay your recovery.  Do not avoid exercise or work.Your body is made to move.It is not dangerous to be active, even though your back may hurt.Your back will likely heal faster if you return to being active before your pain is gone.  Pay attention to your body when you bend and lift. Many people have less discomfortwhen lifting if they bend their knees, keep the load close to their bodies,and  avoid twisting. Often, the most comfortable positions are those that put less stress on your recovering back.  Find a comfortable position to sleep. Use a firm mattress and lie on your side with your knees slightly bent. If you lie on your back, put a pillow under your knees.  Only take over-the-counter or prescription medicines as directed by your caregiver. Over-the-counter medicines to reduce pain and inflammation are often the most helpful.Your caregiver may prescribe muscle relaxant drugs.These medicines help dull your pain so you can more quickly return to your normal activities and healthy exercise.  Put ice on the injured area.  Put ice in a plastic bag.  Place a towel between your skin and the bag.  Leave the ice on for 15-20 minutes, 03-04 times a day for the first 2 to 3 days. After that, ice and heat may be alternated to reduce pain and spasms.  Ask your caregiver about trying back exercises and gentle massage. This may be of some benefit.  Avoid feeling anxious or stressed.Stress increases muscle tension and can worsen back pain.It is important to recognize when you are anxious or stressed and learn ways to manage it.Exercise is a great option. SEEK MEDICAL CARE IF:  You have pain that is not relieved with rest or medicine.  You have pain that does not improve in 1 week.  You have new symptoms.  You are generally not feeling well. SEEK   IMMEDIATE MEDICAL CARE IF:   You have pain that radiates from your back into your legs.  You develop new bowel or bladder control problems.  You have unusual weakness or numbness in your arms or legs.  You develop nausea or vomiting.  You develop abdominal pain.  You feel faint. Document Released: 08/01/2005 Document Revised: 01/31/2012 Document Reviewed: 12/20/2010 [employer] Patient Information 2014 [employer].  

## 2013-12-02 NOTE — Progress Notes (Signed)
   Subjective:    Patient ID: Phillip Barrette., male    DOB: [birth year], 73 y.o.   MRN: [id number]  HPI  73 YO male patient comes in today with complaints of pain running down his right thigh. It started last week just as an ache. The pain started last week. The pain has increased gradually. The first pain was in his lower back a few weeks ago. He does not recall any injury or bruising.  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 He also frequently lifts and pulls. The pain is keeping him up at night.  He has tried stretching and elevating his leg with no relief.   Pt also request a printed script for lancets and glucose strips. The pharmacy pt typically uses has been out of stock. He will bring these to another location.  Review of Systems     Objective:   Physical Exam there is minimal tenderness over the lower lumbar spine. Straight leg raising is negative. Motor strength is 5 out of 5 lower extremities. Deep tendon reflexes of the knees and ankles are 2+  [facility] reading (PRIMARY) by  Dr Everlene Farrier degenerative changes L4-5 and L5-S1 there is lower lumbar spine degenerative disc disease. Femur films are normal. Results for orders placed in visit on 12/02/13  POCT CBC      Result Value Ref Range   WBC 6.7  4.6 - 10.2 K/uL   Lymph, poc 2.6  0.6 - 3.4   POC LYMPH PERCENT 38.5  10 - 50 %L   MID (cbc) 0.3  0 - 0.9   POC MID % 4.9  0 - 12 %M   POC Granulocyte 3.8  2 - 6.9   Granulocyte percent 56.6  37 - 80 %G   RBC 5.12  4.69 - 6.13 M/uL   Hemoglobin 15.2  14.1 - 18.1 g/dL   HCT, POC 48.0  43.5 - 53.7 %   MCV 93.7  80 - 97 fL   MCH, POC 29.7  27 - 31.2 pg   MCHC 31.7 (*) 31.8 - 35.4 g/dL   RDW, POC 14.1     Platelet Count, POC 272  142 - 424 K/uL   MPV 10.7  0 - 99.8 fL  GLUCOSE, POCT (MANUAL RESULT ENTRY)      Result Value Ref Range   POC Glucose 97  70 - 99 mg/dl  POCT GLYCOSYLATED HEMOGLOBIN (HGB A1C)      Result Value Ref Range   Hemoglobin  A1C 6.3          Assessment & Plan:  Sugar is 6.3. I do feel we can continue with diet and exercise. He should check his sugar at least 3 times a week. I also feel his leg pain is coming from his back. He has significant degenerative disc disease in the lower lumbar spine and this would explain his back pain.

## 2013-12-11 ENCOUNTER — Encounter (HOSPITAL_COMMUNITY): Payer: Self-pay | Admitting: Emergency Medicine

## 2013-12-11 ENCOUNTER — Emergency Department (HOSPITAL_COMMUNITY)
Admission: EM | Admit: 2013-12-11 | Discharge: 2013-12-11 | Disposition: A | Discharge status: Home / Self Care | Payer: Medicare Other | Attending: Emergency Medicine | Admitting: Emergency Medicine

## 2013-12-11 DIAGNOSIS — Z8619 Personal history of other infectious and parasitic diseases: Secondary | ICD-10-CM | POA: Insufficient documentation

## 2013-12-11 DIAGNOSIS — Z79899 Other long term (current) drug therapy: Secondary | ICD-10-CM | POA: Insufficient documentation

## 2013-12-11 DIAGNOSIS — I10 Essential (primary) hypertension: Secondary | ICD-10-CM | POA: Insufficient documentation

## 2013-12-11 DIAGNOSIS — Z791 Long term (current) use of non-steroidal anti-inflammatories (NSAID): Secondary | ICD-10-CM | POA: Insufficient documentation

## 2013-12-11 DIAGNOSIS — M545 Low back pain, unspecified: Secondary | ICD-10-CM

## 2013-12-11 DIAGNOSIS — M5430 Sciatica, unspecified side: Secondary | ICD-10-CM | POA: Insufficient documentation

## 2013-12-11 DIAGNOSIS — E119 Type 2 diabetes mellitus without complications: Secondary | ICD-10-CM | POA: Insufficient documentation

## 2013-12-11 DIAGNOSIS — Z880 Allergy status to penicillin: Secondary | ICD-10-CM | POA: Insufficient documentation

## 2013-12-11 DIAGNOSIS — J45909 Unspecified asthma, uncomplicated: Secondary | ICD-10-CM | POA: Insufficient documentation

## 2013-12-11 DIAGNOSIS — F17200 Nicotine dependence, unspecified, uncomplicated: Secondary | ICD-10-CM | POA: Insufficient documentation

## 2013-12-11 MED ORDER — OXYCODONE-ACETAMINOPHEN 5-325 MG PO TABS: 1.0000 | ORAL_TABLET | ORAL | Status: DC | PRN

## 2013-12-11 MED ORDER — OXYCODONE-ACETAMINOPHEN 5-325 MG PO TABS
2.0000 | ORAL_TABLET | Freq: Once | ORAL | Status: AC
  Administered 2013-12-11: 2 via ORAL
  Filled 2013-12-11: qty 2

## 2013-12-11 MED ORDER — IBUPROFEN 800 MG PO TABS: 800.0000 mg | ORAL_TABLET | Freq: Three times a day (TID) | ORAL | Status: DC

## 2013-12-11 NOTE — Discharge Instructions (Signed)
Take your wallet out of your back pocket. Call for a follow up appointment with a Family or Primary Care Provider.  Call Dr. Patrice Paradise for further evaluation of your low back pain and sciatic discomfort. Return if Symptoms worsen.   Take medication as prescribed.  Ice your lower back 3-4 times a day. Take your Ibuprofen with food 3 times a day. Take Norco for the breakthrough pain. Use your pain medication as prescribed and do not operate heavy machinery while on pain medication. Note that your pain medication contains acetaminophen (Tylenol) & its is not reccommended that you use additional acetaminophen (Tylenol) while taking this medication.  Call your employer about narcotic pain medication.

## 2013-12-11 NOTE — ED Notes (Signed)
Pt presents to department for evaluation of back pain radiating down both legs. States he picked up wood outside and strained back. 10/10 pain at the time. Pt is alert and oriented x4.

## 2013-12-11 NOTE — ED Provider Notes (Signed)
CSN: [id number]     Arrival date & time 12/11/13  1448 History  This chart was scribed for non-physician practitioner Harvie Heck, PA-C working with Threasa Beards, MD by Zettie Pho, ED Scribe. This patient was seen in room TR11C/TR11C and the patient's care was started at 3:57 PM.    Chief Complaint  Patient presents with  . Back Pain   The history is provided by the patient and medical records. No language interpreter was used.   HPI Comments: Phillip Maynard. is a 73 y.o. male who presents to the Emergency Department complaining of a constant pain to the lower back that radiates down the bilateral legs with sudden onset about an hour ago after he reports that he was picking up heavy pieces of wood. He states that the pain is exacerbated with movement. Patient was seen at urgent care 9 days ago on 12/02/2013 for similar complaints and received an x-ray of the L spine that indicated degenerative changes, but was negative for acute injuries, and he was given meloxicam and Ultram. He reports that these medications were effective at alleviating his pain, but that he did not like their other effects, so he stopped taking them yesterday. He also reports taking ibuprofen at home with relief of his prior back pain, but not his current pain. Patient has a history of Hepatitis C, HTN, hypercholesterolemia, and type II DM.   Past Medical History  Diagnosis Date  . Blood transfusion 1970's  . Bronchitis   . Hepatitis C   . Black lung   . Hypertension   . High cholesterol   . Type II diabetes mellitus   . GERD (gastroesophageal reflux disease)   . Asthma     "when I was a boy" (04/08/2013)   Past Surgical History  Procedure Laterality Date  . Tonsillectomy    . Incision and drainage of wound Right 1970's    "leg" (04/08/2013)  . Liver biopsy  ~ 2011   Family History  Problem Relation Age of Onset  . Asthma Mother    History  Substance Use Topics  . Smoking status: Current Every Day  Smoker -- 1.00 packs/day for 45 years    Types: Cigarettes  . Smokeless tobacco: Never Used  . Alcohol Use: Yes     Comment: 04/08/2013 "haven't drank nothing in > 3 yr; before then I'd have at least 1 pint/day"    Review of Systems  Constitutional: Negative for fever and chills.  Gastrointestinal: Negative for abdominal pain.  Musculoskeletal: Positive for back pain.  Skin: Negative for wound.  Neurological: Negative for weakness and numbness.  All other systems reviewed and are negative.     Allergies  Penicillins and Shellfish allergy  Home Medications   Prior to Admission medications   Medication Sig Start Date End Date Taking? Authorizing Provider  glucose blood test strip Check home blood sugar once daily 12/02/13  Yes Darlyne Russian, MD  Lancets Misc. MISC Use for home glucose monitoring as directed 12/02/13  Yes Darlyne Russian, MD  lansoprazole (PREVACID) 30 MG capsule Take 30 mg by mouth daily as needed (for GERD).   Yes Historical Provider, MD  losartan-hydrochlorothiazide (HYZAAR) 100-12.5 MG per tablet Take 0.5 tablets by mouth daily. 07/29/13  Yes Wendie Agreste, MD  meloxicam (MOBIC) 15 MG tablet Take 1 tablet (15 mg total) by mouth daily. 12/02/13  Yes Darlyne Russian, MD  tamsulosin (FLOMAX) 0.4 MG CAPS capsule Take 0.4 mg by mouth.  Yes Historical Provider, MD  traMADol (ULTRAM) 50 MG tablet Take 1 tablet (50 mg total) by mouth every 8 (eight) hours as needed. 12/02/13  Yes Darlyne Russian, MD   Triage Vitals: BP 126/80  Pulse 76  Temp(Src) 97.8 F (36.6 C) (Oral)  Resp 18  SpO2 99% Physical Exam  Nursing note and vitals reviewed. Constitutional: He is oriented to person, place, and time. He appears well-developed and well-nourished. No distress.  HENT:  Head: Normocephalic and atraumatic.  Eyes: Conjunctivae are normal.  Neck: Normal range of motion. Neck supple.  Pulmonary/Chest: Effort normal. No respiratory distress.  Musculoskeletal: Normal range of  motion.  No midline C-spine, T-spine, or L-spine tenderness with no step-offs, crepitus, or deformities noted. No spasms noted. Reproducible tenderness to palpation of right SI joint. Good and equal strength and sensation to bilateral lower extremities.  Neurological: He is alert and oriented to person, place, and time.  Distal sensation intact. Normal strength against resistance of bilateral lower extremities.   Skin: Skin is warm and dry.  Psychiatric: He has a normal mood and affect. His behavior is normal.    ED Course  Procedures (including critical care time)  COORDINATION OF CARE: 4:05 PM- Discussed that symptoms are likely muscular in nature. Will discharge patient with ibuprofen and pain medication to manage symptoms. Advised patient to follow up with a specialist if symptoms persist with treatment. Advised of further symptomatic care at home. Discussed treatment plan with patient at bedside and patient verbalized agreement.     Labs Review Labs Reviewed - No data to display  Imaging Review No results found.   EKG Interpretation None      MDM   Final diagnoses:  Low back pain  Sciatica   Patient with back pain, likely sciatica.  No neurological deficits and normal neuro exam. Pt was seen 12/02/2013 by his PCP for similar complaints, XR shows degenerative changes. Patient can walk but states is painful.  No loss of bowel or bladder control.  No concern for cauda equina.  RICE protocol and pain medicine indicated and discussed with patient.   Meds given in ED:  Medications  oxyCODONE-acetaminophen (PERCOCET/ROXICET) 5-325 MG per tablet 2 tablet (2 tablets Oral Given 12/11/13 1619)    Discharge Medication List as of 12/11/2013  4:28 PM    START taking these medications   Details  ibuprofen (ADVIL,MOTRIN) 800 MG tablet Take 1 tablet (800 mg total) by mouth 3 (three) times daily. Take with food, Starting 12/11/2013, Until Discontinued, Print    oxyCODONE-acetaminophen  (PERCOCET/ROXICET) 5-325 MG per tablet Take 1 tablet by mouth every 4 (four) hours as needed for severe pain. May take 2 tablets PO q 6 hours for severe pain - Do not take with Tylenol as this tablet already contains tylenol, Starting 12/11/2013, Until Discontinued, Print       I personally performed the services described in this documentation, which was scribed in my presence. The recorded information has been reviewed and is accurate.      Lorrine Kin, PA-C 12/12/13 2256

## 2013-12-12 ENCOUNTER — Telehealth: Payer: Self-pay

## 2013-12-12 DIAGNOSIS — M549 Dorsalgia, unspecified: Secondary | ICD-10-CM

## 2013-12-12 NOTE — Telephone Encounter (Signed)
Spoke with pt (see message below). Pt would like a referral to Dr. Patrice Paradise. I put in the referral for you  Call patient to see if he needs [place] to help with his referral to Dr. Patrice Paradise for back pain ----- Message ----- From: SYSTEM Sent: 12/11/2013 4:49 PM To: Darlyne Russian, MD

## 2013-12-17 ENCOUNTER — Encounter (HOSPITAL_COMMUNITY): Payer: Self-pay | Admitting: Emergency Medicine

## 2013-12-17 ENCOUNTER — Emergency Department (HOSPITAL_COMMUNITY)
Admission: EM | Admit: 2013-12-17 | Discharge: 2013-12-17 | Disposition: A | Discharge status: Home / Self Care | Payer: Medicare Other | Attending: Emergency Medicine | Admitting: Emergency Medicine

## 2013-12-17 ENCOUNTER — Emergency Department (HOSPITAL_COMMUNITY): Payer: Medicare Other

## 2013-12-17 DIAGNOSIS — M549 Dorsalgia, unspecified: Secondary | ICD-10-CM | POA: Insufficient documentation

## 2013-12-17 DIAGNOSIS — Z7982 Long term (current) use of aspirin: Secondary | ICD-10-CM | POA: Insufficient documentation

## 2013-12-17 DIAGNOSIS — R112 Nausea with vomiting, unspecified: Secondary | ICD-10-CM | POA: Insufficient documentation

## 2013-12-17 DIAGNOSIS — K5900 Constipation, unspecified: Secondary | ICD-10-CM | POA: Insufficient documentation

## 2013-12-17 DIAGNOSIS — Z880 Allergy status to penicillin: Secondary | ICD-10-CM | POA: Insufficient documentation

## 2013-12-17 DIAGNOSIS — K5732 Diverticulitis of large intestine without perforation or abscess without bleeding: Secondary | ICD-10-CM | POA: Insufficient documentation

## 2013-12-17 DIAGNOSIS — K5792 Diverticulitis of intestine, part unspecified, without perforation or abscess without bleeding: Secondary | ICD-10-CM

## 2013-12-17 DIAGNOSIS — Z79899 Other long term (current) drug therapy: Secondary | ICD-10-CM | POA: Insufficient documentation

## 2013-12-17 LAB — CBC WITH DIFFERENTIAL/PLATELET
BASOS ABS: 0 10*3/uL (ref 0.0–0.1)
BASOS PCT: 0 % (ref 0–1)
EOS ABS: 0.1 10*3/uL (ref 0.0–0.7)
Eosinophils Relative: 1 % (ref 0–5)
HCT: 41.4 % (ref 39.0–52.0)
HEMOGLOBIN: 14.5 g/dL (ref 13.0–17.0)
Lymphocytes Relative: 17 % (ref 12–46)
Lymphs Abs: 2.4 10*3/uL (ref 0.7–4.0)
MCH: 31 pg (ref 26.0–34.0)
MCHC: 35 g/dL (ref 30.0–36.0)
MCV: 88.7 fL (ref 78.0–100.0)
MONOS PCT: 8 % (ref 3–12)
Monocytes Absolute: 1.1 10*3/uL — ABNORMAL HIGH (ref 0.1–1.0)
Neutro Abs: 10.7 10*3/uL — ABNORMAL HIGH (ref 1.7–7.7)
Neutrophils Relative %: 74 % (ref 43–77)
Platelets: 243 10*3/uL (ref 150–400)
RBC: 4.67 MIL/uL (ref 4.22–5.81)
RDW: 13.8 % (ref 11.5–15.5)
WBC: 14.4 10*3/uL — ABNORMAL HIGH (ref 4.0–10.5)

## 2013-12-17 LAB — COMPREHENSIVE METABOLIC PANEL
ALBUMIN: 3.4 g/dL — AB (ref 3.5–5.2)
ALK PHOS: 47 U/L (ref 39–117)
ALT: 13 U/L (ref 0–53)
AST: 14 U/L (ref 0–37)
BUN: 13 mg/dL (ref 6–23)
CO2: 22 mEq/L (ref 19–32)
CREATININE: 0.98 mg/dL (ref 0.50–1.35)
Calcium: 9.2 mg/dL (ref 8.4–10.5)
Chloride: 100 mEq/L (ref 96–112)
GFR calc Af Amer: >90 mL/min (ref >90)
GFR calc non Af Amer: 81 mL/min — ABNORMAL LOW (ref >90)
Glucose, Bld: 121 mg/dL — ABNORMAL HIGH (ref 70–99)
POTASSIUM: 3.9 meq/L (ref 3.7–5.3)
Sodium: 135 mEq/L — ABNORMAL LOW (ref 137–147)
TOTAL PROTEIN: 7.3 g/dL (ref 6.0–8.3)
Total Bilirubin: 1.1 mg/dL (ref 0.3–1.2)

## 2013-12-17 LAB — URINALYSIS, ROUTINE W REFLEX MICROSCOPIC
Bilirubin Urine: NEGATIVE
GLUCOSE, UA: NEGATIVE mg/dL
Hgb urine dipstick: NEGATIVE
Ketones, ur: NEGATIVE mg/dL
Leukocytes, UA: NEGATIVE
Nitrite: NEGATIVE
PH: 5 (ref 5.0–8.0)
Protein, ur: NEGATIVE mg/dL
Specific Gravity, Urine: 1.024 (ref 1.005–1.030)
Urobilinogen, UA: 1 mg/dL (ref 0.0–1.0)

## 2013-12-17 MED ORDER — CIPROFLOXACIN HCL 500 MG PO TABS
500.0000 mg | ORAL_TABLET | Freq: Once | ORAL | Status: AC
  Administered 2013-12-17: 500 mg via ORAL
  Filled 2013-12-17: qty 1

## 2013-12-17 MED ORDER — METRONIDAZOLE 500 MG PO TABS: 500.0000 mg | ORAL_TABLET | Freq: Three times a day (TID) | ORAL | Status: DC

## 2013-12-17 MED ORDER — CIPROFLOXACIN HCL 500 MG PO TABS: 500.0000 mg | ORAL_TABLET | Freq: Two times a day (BID) | ORAL | Status: DC

## 2013-12-17 MED ORDER — IOHEXOL 300 MG/ML  SOLN
25.0000 mL | Freq: Once | INTRAMUSCULAR | Status: AC | PRN
  Administered 2013-12-17: 25 mL via ORAL

## 2013-12-17 MED ORDER — ONDANSETRON HCL 4 MG/2ML IJ SOLN
4.0000 mg | Freq: Once | INTRAMUSCULAR | Status: AC
  Administered 2013-12-17: 4 mg via INTRAVENOUS
  Filled 2013-12-17: qty 2

## 2013-12-17 MED ORDER — HYDROCODONE-ACETAMINOPHEN 5-325 MG PO TABS: 1.0000 | ORAL_TABLET | Freq: Four times a day (QID) | ORAL | Status: DC | PRN

## 2013-12-17 MED ORDER — IOHEXOL 300 MG/ML  SOLN
100.0000 mL | Freq: Once | INTRAMUSCULAR | Status: AC | PRN
  Administered 2013-12-17: 100 mL via INTRAVENOUS

## 2013-12-17 MED ORDER — METRONIDAZOLE 500 MG PO TABS
500.0000 mg | ORAL_TABLET | Freq: Once | ORAL | Status: AC
  Administered 2013-12-17: 500 mg via ORAL
  Filled 2013-12-17: qty 1

## 2013-12-17 MED ORDER — SODIUM CHLORIDE 0.9 % IV BOLUS (SEPSIS)
500.0000 mL | Freq: Once | INTRAVENOUS | Status: AC
  Administered 2013-12-17: 500 mL via INTRAVENOUS

## 2013-12-17 MED ORDER — HYDROMORPHONE HCL PF 1 MG/ML IJ SOLN
0.5000 mg | Freq: Once | INTRAMUSCULAR | Status: AC
  Administered 2013-12-17: 0.5 mg via INTRAVENOUS
  Filled 2013-12-17: qty 1

## 2013-12-17 NOTE — Discharge Instructions (Signed)
Your CT scan showed diverticulitis. See information below. Take cipro and flagyl as prescribed for infection. Take norco as prescribed as needed for pain. Follow up with your doctor. Return if worsening, or if develop high fever, vomiting, or any new concerning symptom.    Diverticulitis A diverticulum is a small pouch or sac on the colon. Diverticulosis is the presence of these diverticula on the colon. Diverticulitis is the irritation (inflammation) or infection of diverticula. CAUSES  The colon and its diverticula contain bacteria. If food particles block the tiny opening to a diverticulum, the bacteria inside can grow and cause an increase in pressure. This leads to infection and inflammation and is called diverticulitis. SYMPTOMS   Abdominal pain and tenderness. Usually, the pain is located on the left side of your abdomen. However, it could be located elsewhere.  Fever.  Bloating.  Feeling sick to your stomach (nausea).  Throwing up (vomiting).  Abnormal stools. DIAGNOSIS  Your caregiver will take a history and perform a physical exam. Since many things can cause abdominal pain, other tests may be necessary. Tests may include:  Blood tests.  Urine tests.  X-ray of the abdomen.  CT scan of the abdomen. Sometimes, surgery is needed to determine if diverticulitis or other conditions are causing your symptoms. TREATMENT  Most of the time, you can be treated without surgery. Treatment includes:  Resting the bowels by only having liquids for a few days. As you improve, you will need to eat a low-fiber diet.  Intravenous (IV) fluids if you are losing body fluids (dehydrated).  Antibiotic medicines that treat infections may be given.  Pain and nausea medicine, if needed.  Surgery if the inflamed diverticulum has burst. HOME CARE INSTRUCTIONS   Try a clear liquid diet (broth, tea, or water for as long as directed by your caregiver). You may then gradually begin a low-fiber  diet as tolerated.  A low-fiber diet is a diet with less than 10 grams of fiber. Choose the foods below to reduce fiber in the diet:  White breads, cereals, rice, and pasta.  Cooked fruits and vegetables or soft fresh fruits and vegetables without the skin.  Ground or well-cooked tender beef, ham, veal, lamb, pork, or poultry.  Eggs and seafood.  After your diverticulitis symptoms have improved, your caregiver may put you on a high-fiber diet. A high-fiber diet includes 14 grams of fiber for every 1000 calories consumed. For a standard 2000 calorie diet, you would need 28 grams of fiber. Follow these diet guidelines to help you increase the fiber in your diet. It is important to slowly increase the amount fiber in your diet to avoid gas, constipation, and bloating.  Choose whole-grain breads, cereals, pasta, and brown rice.  Choose fresh fruits and vegetables with the skin on. Do not overcook vegetables because the more vegetables are cooked, the more fiber is lost.  Choose more nuts, seeds, legumes, dried peas, beans, and lentils.  Look for food products that have greater than 3 grams of fiber per serving on the Nutrition Facts label.  Take all medicine as directed by your caregiver.  If your caregiver has given you a follow-up appointment, it is very important that you go. Not going could result in lasting (chronic) or permanent injury, pain, and disability. If there is any problem keeping the appointment, call to reschedule. SEEK MEDICAL CARE IF:   Your pain does not improve.  You have a hard time advancing your diet beyond clear liquids.  Your bowel  movements do not return to normal. SEEK IMMEDIATE MEDICAL CARE IF:   Your pain becomes worse.  You have an oral temperature above 102 F (38.9 C), not controlled by medicine.  You have repeated vomiting.  You have bloody or black, tarry stools.  Symptoms that brought you to your caregiver become worse or are not getting  better. MAKE SURE YOU:   Understand these instructions.  Will watch your condition.  Will get help right away if you are not doing well or get worse. Document Released: 05/11/2005 Document Revised: 10/24/2011 Document Reviewed: 09/06/2010 [facility] Patient Information 2014 [place].

## 2013-12-17 NOTE — ED Provider Notes (Signed)
Medical screening examination/treatment/procedure(s) were performed by non-physician practitioner and as supervising physician I was immediately available for consultation/collaboration.   EKG Interpretation None       Katilyn Miltenberger K Linker, MD 12/17/13 0717 

## 2013-12-17 NOTE — ED Notes (Signed)
Pt finished drinking oral CT contrast. Tommy at CT made aware.

## 2013-12-17 NOTE — ED Provider Notes (Signed)
CSN: [id number]     Arrival date & time 12/17/13  0055 History   First MD Initiated Contact with Patient 12/17/13 [phone]     Chief Complaint  Patient presents with  . Abdominal Pain     (Consider location/radiation/quality/duration/timing/severity/associated sxs/prior Treatment) HPI Phillip Maynard. is a 73 y.o. male who presents emergency department complaining of abdominal pain. Patient states he was seen here a week ago for back pain. States he injured his back at that time. He was started on ibuprofen and Percocet. States has taken Percocet he has had difficult time having a bowel movement. He did not try any medicines for this. States felt "uncomfortable." States yesterday however he developed new sharp pain in the left lower quadrant. States this pain has been there constantly since then. It does not radiate. His last bowel movement was today but was small. He denies any prior abdominal surgeries. No history of similar pain in the past. He did not take any medications for this. He admits to nausea and states he vomited once in emergency department. No urinary symptoms. Denies any fever or chills. States movement and palpation of his abdomen makes his pain worse, nothing makes it better.  Past Medical History  Diagnosis Date  . Blood transfusion 1970's  . Bronchitis   . Hepatitis C   . Black lung   . Hypertension   . High cholesterol   . Type II diabetes mellitus   . GERD (gastroesophageal reflux disease)   . Asthma     "when I was a boy" (04/08/2013)   Past Surgical History  Procedure Laterality Date  . Tonsillectomy    . Incision and drainage of wound Right 1970's    "leg" (04/08/2013)  . Liver biopsy  ~ 2011   Family History  Problem Relation Age of Onset  . Asthma Mother    History  Substance Use Topics  . Smoking status: Current Every Day Smoker -- 1.00 packs/day for 45 years    Types: Cigarettes  . Smokeless tobacco: Never Used  . Alcohol Use: Yes     Comment:  04/08/2013 "haven't drank nothing in > 3 yr; before then I'd have at least 1 pint/day"    Review of Systems  Constitutional: Negative for fever and chills.  Respiratory: Negative for cough, chest tightness and shortness of breath.   Cardiovascular: Negative for chest pain, palpitations and leg swelling.  Gastrointestinal: Positive for nausea, vomiting, abdominal pain and constipation. Negative for diarrhea, blood in stool, abdominal distention and rectal pain.  Genitourinary: Negative for dysuria, urgency, frequency and hematuria.  Musculoskeletal: Positive for back pain. Negative for arthralgias, myalgias, neck pain and neck stiffness.  Skin: Negative for rash.  Allergic/Immunologic: Negative for immunocompromised state.  Neurological: Negative for dizziness, weakness, light-headedness, numbness and headaches.      Allergies  Penicillins and Shellfish allergy  Home Medications   Prior to Admission medications   Medication Sig Start Date End Date Taking? Authorizing Provider  aspirin EC 81 MG tablet Take 81 mg by mouth daily.   Yes Historical Provider, MD  ibuprofen (ADVIL,MOTRIN) 800 MG tablet Take 1 tablet (800 mg total) by mouth 3 (three) times daily. Take with food 12/11/13  Yes Lauren Burnetta Sabin, PA-C  lansoprazole (PREVACID) 30 MG capsule Take 30 mg by mouth daily as needed (for GERD).   Yes Historical Provider, MD  losartan-hydrochlorothiazide (HYZAAR) 100-12.5 MG per tablet Take 0.5 tablets by mouth daily. 07/29/13  Yes Wendie Agreste, MD  oxyCODONE-acetaminophen (  PERCOCET/ROXICET) 5-325 MG per tablet Take 1 tablet by mouth every 4 (four) hours as needed for severe pain. May take 2 tablets PO q 6 hours for severe pain - Do not take with Tylenol as this tablet already contains tylenol 12/11/13  Yes Lauren Burnetta Sabin, PA-C  tamsulosin (FLOMAX) 0.4 MG CAPS capsule Take 0.4 mg by mouth.   Yes Historical Provider, MD   BP 126/44  Pulse 68  Temp(Src) 101.9 F (38.8 C) (Oral)  Resp 20   Ht 5\' 9"  (1.753 m)  Wt 194 lb (87.998 kg)  BMI 28.64 kg/m2  SpO2 94% Physical Exam  Nursing note and vitals reviewed. Constitutional: He is oriented to person, place, and time. He appears well-developed and well-nourished. No distress.  HENT:  Head: Normocephalic and atraumatic.  Eyes: Conjunctivae are normal.  Neck: Neck supple.  Cardiovascular: Normal rate, regular rhythm and normal heart sounds.   Pulmonary/Chest: Effort normal. No respiratory distress. He has no wheezes. He has no rales.  Abdominal: Soft. Bowel sounds are normal. He exhibits no distension. There is tenderness. There is no rebound and no guarding.  Left lower quadrant tenderness  Musculoskeletal: He exhibits no edema.  Neurological: He is alert and oriented to person, place, and time.  Skin: Skin is warm and dry.    ED Course  Procedures (including critical care time) Labs Review Labs Reviewed  CBC WITH DIFFERENTIAL - Abnormal; Notable for the following:    WBC 14.4 (*)    Neutro Abs 10.7 (*)    Monocytes Absolute 1.1 (*)    All other components within normal limits  COMPREHENSIVE METABOLIC PANEL - Abnormal; Notable for the following:    Sodium 135 (*)    Glucose, Bld 121 (*)    Albumin 3.4 (*)    GFR calc non Af Amer 81 (*)    All other components within normal limits  URINALYSIS, ROUTINE W REFLEX MICROSCOPIC    Imaging Review Ct Abdomen Pelvis W Contrast  12/17/2013   CLINICAL DATA:  Left lower quadrant pain with constipation  EXAM: CT ABDOMEN AND PELVIS WITH CONTRAST  TECHNIQUE: Multidetector CT imaging of the abdomen and pelvis was performed using the standard protocol following bolus administration of intravenous contrast.  CONTRAST:  164mL OMNIPAQUE IOHEXOL 300 MG/ML  SOLN  COMPARISON:  None.  FINDINGS: Lung bases are clear.  No pericardial fluid.  No focal hepatic lesion is gallstone within the gallbladder. No gallbladder inflammation. The pancreas, spleen, adrenal glands, and right kidney are  normal. There is a low-density cyst in the left kidney.  Stomach, small bowel, appendix, and cecum are normal. Beginning in the descending colon there is circumferential bowel wall thickening and pericolonic inflammation. Small amount fluid along the left pericolic gutter at this level (image 60 to through 70 of the axial series 201). There are multiple diverticula through this region. The findings are most consistent with acute diverticulitis. No evidence of perforation or abscess. The rectum is normal.  The abdominal aorta is normal caliber. No retroperitoneal periportal lymphadenopathy.  No free fluid the pelvis. Prostate gland and bladder normal. No pelvic lymphadenopathy. No aggressive osseous lesion.  IMPRESSION: 1. Acute diverticulitis of the proximal sigmoid colon without macro perforation or abscess. 2. Consider followup colonoscopy or imaging if patient is not current on screening colonoscopy to exclude underlying neoplasm.   Electronically Signed   By: Suzy Bouchard M.D.   On: 12/17/2013 07:58     EKG Interpretation None      MDM  Final diagnoses:  Diverticulitis   Patient with a sharp left lower quadrant pain, tender to palpation. Tenderness is significant. Although constipation has been considered, given patient's age and elevated white count we'll get a CT scan of his abdomen and pelvis. Pain medication and diabetic started.   8:20 AM CT showing acute diverticulitis without perforation or abscess. Pt reassessed. No acute abdomen. Pain controlled with 0.5 mg of dilaudid iv. He wants to go home. VS stable. Will start on cipro, flagyl, norco for pain, follow up with PCP. Return precautions given.   Filed Vitals:   12/17/13 0415 12/17/13 0530 12/17/13 0600 12/17/13 0734  BP: 147/68 126/46 126/44 135/63  Pulse: 74 70 68 62  Temp:    99.3 F (37.4 C)  TempSrc:    Oral  Resp:    18  Height:      Weight:      SpO2: 97% 96% 94% 96%     Renold Genta, PA-C 12/17/13  1716

## 2013-12-17 NOTE — ED Notes (Signed)
Pt. reports LLQ pain with constipation onset yesterday , denies nausea or vomitting .

## 2013-12-17 NOTE — ED Provider Notes (Signed)
Medical screening examination/treatment/procedure(s) were conducted as a shared visit with non-physician practitioner(s) and myself.  I personally evaluated the patient during the encounter.  7:30 AM Abdomen is soft, nondistended but with marked left lower quadrant tenderness. CT pending to evaluate for diverticulitis.    Karen Chafe Lorrie Strauch, MD 12/17/13 0730

## 2014-02-03 ENCOUNTER — Ambulatory Visit (INDEPENDENT_AMBULATORY_CARE_PROVIDER_SITE_OTHER): Payer: Medicare Other | Admitting: Family Medicine

## 2014-02-03 ENCOUNTER — Encounter: Payer: Self-pay | Admitting: Family Medicine

## 2014-02-03 VITALS — BP 160/72 | HR 56 | Temp 98.4°F | Resp 18 | Ht 67.5 in | Wt 189.2 lb

## 2014-02-03 DIAGNOSIS — E119 Type 2 diabetes mellitus without complications: Secondary | ICD-10-CM

## 2014-02-03 DIAGNOSIS — I10 Essential (primary) hypertension: Secondary | ICD-10-CM

## 2014-02-03 NOTE — Patient Instructions (Addendum)
Keep a record of your blood pressures and blood sugars outside of the office and bring them to the next office visit in 6-8 weeks. Bring your meter (blood sugar and blood pressure meter) to that office visit to compare to our readings.  If your readings are over 140/90 outside of the office, we may need to adjust medicines, but as you have had lower readings - can continue same dose of medicines at this time.

## 2014-02-03 NOTE — Progress Notes (Signed)
Subjective:   This chart was scribed for Wendie Agreste, MD by Forrestine Him, [facility] Scribe. This patient was seen in room 23 and the patient's care was started 5:35 PM.    Patient ID: Phillip Barrette., male    DOB: [birth year], 73 y.o.   MRN: [id number]  HPI  HPI Comments: Phillip Hendon. is a 73 y.o. male  With a PMHx of DM and HTN who presents to [facility] here for HTN follow up today. Last OV 07/2013.   Diabetes Mellitus: Last seen by Dr. Everlene Farrier 11/2013 A1C 6.3. Pt on diet control only States he is checking his blood sugars at home but is unaware of recorded readings  HTN: Pt takes Hyzaar 100/12.5 mg 1-0.5 tab daily and Aspirin 81 mg daily Blood pressure at  12/02/2013 reading was 160/80 but when seen in ED on 4/29 it was126/80 and 139/77. During that ED visit, pt had blood pressure readings as low as 126/44 and as high as147/68. Pt reports systolic blood pressure readings of 130-138 at home No light-headedness, dizziness, SOB, or CP at this time  Lab Results  Component Value Date   CREATININE 0.98 12/17/2013    Pt may follow with Neurosurgery on 02/20/2014 for back pain after lifting heavy lumbar in his yard, but pain has resolved recently with change in activity.   Pt was diagnosed with diverticulitis on 12/2013 in the ED. He was started on Cipro and Flagyl for symptoms. Pt was also given pain medication to manage discomfort. States he is doing well at this time without any symptoms.  Patient Active Problem List   Diagnosis Date Noted  . Severe sepsis(995.92) 04/05/2013  . Pyelonephritis 04/05/2013  . Hypertension 02/28/2013  . Type 2 diabetes mellitus 02/28/2013  . Hepatitis C virus infection 02/14/2011  . Urinary (tract) obstruction 02/14/2011  . TOBACCO ABUSE 12/10/2007  . 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 Blood transfusion 1970's  . Bronchitis   . Hepatitis C   . Black lung    . Hypertension   . High cholesterol   . Type II diabetes mellitus   . GERD (gastroesophageal reflux disease)   . Asthma     "when I was a boy" (04/08/2013)   Past Surgical History  Procedure Laterality Date  . Tonsillectomy    . Incision and drainage of wound Right 1970's    "leg" (04/08/2013)  . Liver biopsy  ~ 2011   Allergies  Allergen Reactions  . Penicillins Anaphylaxis  . Shellfish Allergy Anaphylaxis   Prior to Admission medications   Medication Sig Start Date End Date Taking? Authorizing Provider  aspirin EC 81 MG tablet Take 81 mg by mouth daily.   Yes Historical Provider, MD  lansoprazole (PREVACID) 30 MG capsule Take 30 mg by mouth daily as needed (for GERD).   Yes Historical Provider, MD  losartan-hydrochlorothiazide (HYZAAR) 100-12.5 MG per tablet Take 0.5 tablets by mouth daily. 07/29/13  Yes Wendie Agreste, MD  tamsulosin (FLOMAX) 0.4 MG CAPS capsule Take 0.4 mg by mouth.   Yes Historical Provider, MD  ciprofloxacin (CIPRO) 500 MG tablet Take 1 tablet (500 mg total) by mouth 2 (two) times daily. 12/17/13   Tatyana A Kirichenko, PA-C  HYDROcodone-acetaminophen (NORCO) 5-325 MG per tablet Take 1 tablet by mouth every 6 (six) hours as needed for moderate pain. 12/17/13   Tatyana A Kirichenko, PA-C  ibuprofen (ADVIL,MOTRIN) 800  MG tablet Take 1 tablet (800 mg total) by mouth 3 (three) times daily. Take with food 12/11/13   Lorrine Kin, PA-C  metroNIDAZOLE (FLAGYL) 500 MG tablet Take 1 tablet (500 mg total) by mouth 3 (three) times daily. 12/17/13   Tatyana A Kirichenko, PA-C  oxyCODONE-acetaminophen (PERCOCET/ROXICET) 5-325 MG per tablet Take 1 tablet by mouth every 4 (four) hours as needed for severe pain. May take 2 tablets PO q 6 hours for severe pain - Do not take with Tylenol as this tablet already contains tylenol 12/11/13   Lorrine Kin, PA-C   History   Social History  . Marital Status: Married    Spouse Name: N/A    Number of Children: N/A  . Years of  Education: N/A   Occupational History  . truck driver    Social History Main Topics  . Smoking status: Current Every Day Smoker -- 1.00 packs/day for 45 years    Types: Cigarettes  . Smokeless tobacco: Never Used  . Alcohol Use: Yes     Comment: 04/08/2013 "haven't drank nothing in > 3 yr; before then I'd have at least 1 pint/day"  . Drug Use: No  . Sexual Activity: Yes   Other Topics Concern  . Not on file   Social History Narrative  . No narrative on file     Review of Systems  Constitutional: Negative for fatigue and unexpected weight change.  Eyes: Negative for visual disturbance.  Respiratory: Negative for cough, chest tightness and shortness of breath.   Cardiovascular: Negative for chest pain, palpitations and leg swelling.  Gastrointestinal: Negative for abdominal pain and blood in stool.  Neurological: Negative for dizziness, light-headedness and headaches.     Objective:  Physical Exam  Vitals reviewed. Constitutional: He is oriented to person, place, and time. He appears well-developed and well-nourished.  HENT:  Head: Normocephalic and atraumatic.  Eyes: EOM are normal. Pupils are equal, round, and reactive to light.  Neck: No JVD present. Carotid bruit is not present.  Cardiovascular: Normal rate, regular rhythm and normal heart sounds.   No murmur heard. Pulmonary/Chest: Effort normal and breath sounds normal. He has no rales.  Musculoskeletal: He exhibits no edema.  Neurological: He is alert and oriented to person, place, and time.  Skin: Skin is warm and dry.  Psychiatric: He has a normal mood and affect.     Filed Vitals:   02/03/14 1620  BP: 157/69  Pulse: 56  Temp: 98.4 F (36.9 C)  TempSrc: Oral  Resp: 18  Height: 5' 7.5" (1.715 m)  Weight: 189 lb 3.2 oz (85.821 kg)  SpO2: 98%      Assessment & Plan:  Phillip Rasnic. is a 73 y.o. male Essential hypertension -   - variable control in office, but outside of here up and down as  well, including at ER visit, and some relative lows in past.   - will continue same regimen for now, but to bring record of BP readings out of office to next visit along with meter.    Type 2 diabetes mellitus without complication  - diet controlled, with controlled A1c few months ago.  Too early for repeat A1c, but plan on follow up with meter and reading in next 6-8 weeks.   No orders of the defined types were placed in this encounter.   Patient Instructions  Keep a record of your blood pressures and blood sugars outside of the office and bring them to the next  office visit in 6-8 weeks. Bring your meter (blood sugar and blood pressure meter) to that office visit to compare to our readings.  If your readings are over 140/90 outside of the office, we may need to adjust medicines, but as you have had lower readings - can continue same dose of medicines at this time.        I personally performed the services described in this documentation, which was scribed in my presence. The recorded information has been reviewed and is accurate.

## 2014-02-05 NOTE — Progress Notes (Signed)
Scheduled appointment for August 3 @ 415

## 2014-02-11 ENCOUNTER — Telehealth: Payer: Self-pay

## 2014-02-11 NOTE — Telephone Encounter (Signed)
Pt is looking for drug screen results. I'm thinking this is a paper chart. Forbes Cellar is going to call the patient.

## 2014-02-11 NOTE — Telephone Encounter (Signed)
PATIENT WANTS TO KNOW LAB RESULTS

## 2014-03-10 NOTE — Telephone Encounter (Signed)
Spine specialist called, pt missed appointment and will have to call the office to make appointment at this time.  [phone]  lmom for pt to return call at both home and mobile to cb concerning missed appointment with spine specialist.

## 2014-03-17 ENCOUNTER — Ambulatory Visit: Payer: Self-pay | Admitting: Family Medicine

## 2014-04-16 ENCOUNTER — Encounter: Payer: Self-pay | Admitting: *Deleted

## 2014-04-24 ENCOUNTER — Encounter: Payer: Self-pay | Admitting: *Deleted

## 2014-05-05 ENCOUNTER — Telehealth: Payer: Self-pay | Admitting: *Deleted

## 2014-05-05 NOTE — Telephone Encounter (Signed)
[place] me on home answering machine, leaving office # and my direct #, regarding scheduling his appt for his AMWE with Dr. Everlene Farrier.

## 2014-05-09 ENCOUNTER — Encounter: Payer: Self-pay | Admitting: *Deleted

## 2014-05-09 ENCOUNTER — Telehealth: Payer: Self-pay | Admitting: *Deleted

## 2014-05-09 NOTE — Telephone Encounter (Signed)
OK.  Let's keep him scheduled with me for now.

## 2014-05-09 NOTE — Telephone Encounter (Signed)
Patient phoned yesterday afternoon, responding to letter that I had sent, and I returned his call this morning.  Attempted to schedule appt with Dr. Everlene Farrier, but earliest available was mid December.  Pt requested something sooner and stated he did not care which provider he saw, as long as he could see someone sooner with afternoon options.  CPE scheduled with Dr. Reginia Forts for 06/11/14 @ 1600--- patient requested a late afternoon appt so that it would not interfere with work.  I asked that if he needed to cancel/reschedule, to contact me as soon as he knew & explained to him my role between his insurance company & his doctor.  Verbalized understanding & appreciation.  Stated he had not had an eye exam for a "long time" because he didn't have vision insurance.  Explained to him that it was quite possible that since he was a diabetic and his insurance company was requesting a diabetic eye exam, it may be covered under his medical.  Stated he would check into that.

## 2014-05-09 NOTE — Telephone Encounter (Signed)
Medicare patients really really need to see their PCP for Medicare Wellness Examinations.  However, Upon review of the chart, the patient underwent last physical/wellness exam by [name]. [name] has also seen patient more in the past two years than Dr. Everlene Farrier; it would be more appropriate for [name] to see this patient for an Annual Wellness Exam; does he have availabilities in October or November?

## 2014-05-09 NOTE — Telephone Encounter (Signed)
I've looked--Dr. Vonna Kotyk calendar is booked---he's got 2 weeks off in October (12-25).  11/30----already has 4 CPE's scheduled 12/07----3 CPE's 12/21 @ 1600 is a possibility if patient agreeable 12/28 is fairly open  This patient also has a hx of no shows/cancellations....  I'll do whatever is required, but I'm not sure about the patient.  His scheduling needs were un-misunderstandable---he needed mid-late afternoon due to his job & wasn't certain that once he informed his employer of the appt already scheduled that he would be able to make that.

## 2014-06-11 ENCOUNTER — Encounter: Payer: Self-pay | Admitting: Family Medicine

## 2014-06-18 ENCOUNTER — Telehealth: Payer: Self-pay | Admitting: *Deleted

## 2014-06-18 NOTE — Telephone Encounter (Signed)
Phoned patient & [facility] with my name, title, location & direct number to reschedule his appt (originally scheduled 10/28 that he n/s).

## 2014-07-22 ENCOUNTER — Other Ambulatory Visit: Payer: Self-pay | Admitting: Family Medicine

## 2014-09-07 ENCOUNTER — Other Ambulatory Visit: Payer: Self-pay | Admitting: Family Medicine

## 2014-09-09 NOTE — Telephone Encounter (Signed)
Mobile # not in service, H # "person is not available right now". Could not LM. Pt needs OV what are f/up plans?

## 2014-09-22 ENCOUNTER — Encounter: Payer: Self-pay | Admitting: Family Medicine

## 2014-09-22 ENCOUNTER — Ambulatory Visit (INDEPENDENT_AMBULATORY_CARE_PROVIDER_SITE_OTHER): Payer: Commercial Managed Care - HMO | Admitting: Family Medicine

## 2014-09-22 VITALS — BP 164/60 | HR 65 | Temp 98.2°F | Resp 16 | Ht 67.5 in | Wt 195.0 lb

## 2014-09-22 DIAGNOSIS — E119 Type 2 diabetes mellitus without complications: Secondary | ICD-10-CM | POA: Diagnosis not present

## 2014-09-22 DIAGNOSIS — Z23 Encounter for immunization: Secondary | ICD-10-CM

## 2014-09-22 DIAGNOSIS — I10 Essential (primary) hypertension: Secondary | ICD-10-CM | POA: Diagnosis not present

## 2014-09-22 LAB — POCT GLYCOSYLATED HEMOGLOBIN (HGB A1C): Hemoglobin A1C: 6.5

## 2014-09-22 LAB — GLUCOSE, POCT (MANUAL RESULT ENTRY): POC Glucose: 127 mg/dl — AB (ref 70–99)

## 2014-09-22 MED ORDER — LOSARTAN POTASSIUM-HCTZ 100-12.5 MG PO TABS: 0.5000 | ORAL_TABLET | Freq: Every day | ORAL | Status: DC

## 2014-09-22 NOTE — Progress Notes (Addendum)
Subjective:  This chart was scribed for Phillip Ray, MD by Donato Schultz, Medical Scribe. This patient was seen in Room 24 and the patient's care was started at 5:04 PM.   Patient ID: Phillip Maynard., male    DOB: 1941/01/04, 74 y.o.   MRN: [id number]  HPI HPI Comments: Phillip Maynard. is a 74 y.o. male who presents [facility] the [facility] for a medication refill.  Patient was last seen in June 2015.  He last ate at 11:30 AM this morning and a bag of chips at 2:30 PM this afternoon.  He received a flu shot today.  1. Diabetes - Diet controlled at that time.  His A1C had just been completed in April 2015 at 6.3.  Planned on follow-up for his meter for home readings in 6-8 weeks and repeat at that time.  Multiple telephone notes reviewed.  Noted that he plans on a Medicare physical with me.  He was checking his blood sugars at home at his last visit but was unsure of his home readings.  He denies blurred vision, increased thirst, weight loss, and polyuria as associated symptoms.  He lists increased frequency while taking medication to treat his enlarged prostate.  He has not seen his ophthalmologist or dentist recently.  He does not check his blood sugar regularly at home.    2. Hypertension - Last evaluated June of last year.  His kidney function was overall normal with a creatinine of 0.98 in May of 2015.  Was taking Aspirin 52m QD and Hyzar 100-12.574mone half tablet QD.  He has had up and down blood pressures including relative low blood pressures in the past.  Reading at last visit was 160/72.  He was instructed to bring his blood pressure meter and check outside blood pressures for follow-up in 6-8 weeks.  No changes in dose of medicines.  He is out of medications today.  Last refilled for #15 on December 9 as overdue for follow-up.  He ran out of his medication a week ago.  His blood pressure on the medication was 130-135/70-90.  He denies chest pain, SOB,  lightheadedness, hematochezia, leg swelling, dizziness, and melena as associated symptoms. He stopped taking baby aspirin QD.     Patient Active Problem List   Diagnosis Date Noted  . Severe sepsis(995.92) 04/05/2013  . Pyelonephritis 04/05/2013  . Hypertension 02/28/2013  . Type 2 diabetes mellitus 02/28/2013  . Hepatitis C virus infection 02/14/2011  . Urinary (tract) obstruction 02/14/2011  . TOBACCO ABUSE 12/10/2007  . 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 Blood transfusion 1970's  . Bronchitis   . Hepatitis C   . Black lung   . Hypertension   . High cholesterol   . Type II diabetes mellitus   . GERD (gastroesophageal reflux disease)   . Asthma     "when I was a boy" (04/08/2013)   Past Surgical History  Procedure Laterality Date  . Tonsillectomy    . Incision and drainage of wound Right 1970's    "leg" (04/08/2013)  . Liver biopsy  ~ 2011   Allergies  Allergen Reactions  . Penicillins Anaphylaxis  . Shellfish Allergy Anaphylaxis   Prior to Admission medications   Medication Sig Start Date End Date Taking? Authorizing Provider  lansoprazole (PREVACID) 30 MG capsule Take 30 mg by mouth daily as needed (for GERD).   Yes Historical Provider, MD  losartan-hydrochlorothiazide (HYZAAR) 100-12.5  MG per tablet Take 0.5 tablets by mouth daily. PATIENT NEEDS OFFICE VISIT FOR ADDITIONAL REFILLS 07/23/14  Yes Wendie Agreste, MD  tamsulosin (FLOMAX) 0.4 MG CAPS capsule Take 0.4 mg by mouth.   Yes Historical Provider, MD  aspirin EC 81 MG tablet Take 81 mg by mouth daily.    Historical Provider, MD  HYDROcodone-acetaminophen (NORCO) 5-325 MG per tablet Take 1 tablet by mouth every 6 (six) hours as needed for moderate pain. Patient not taking: Reported on 09/22/2014 12/17/13   Tatyana A Kirichenko, PA-C  ibuprofen (ADVIL,MOTRIN) 800 MG tablet Take 1 tablet (800 mg total) by mouth 3 (three) times daily. Take with food Patient not taking: Reported on  09/22/2014 12/11/13   Harvie Heck, PA-C  oxyCODONE-acetaminophen (PERCOCET/ROXICET) 5-325 MG per tablet Take 1 tablet by mouth every 4 (four) hours as needed for severe pain. May take 2 tablets PO q 6 hours for severe pain - Do not take with Tylenol as this tablet already contains tylenol Patient not taking: Reported on 09/22/2014 12/11/13   Harvie Heck, PA-C   History   Social History  . Marital Status: Married    Spouse Name: N/A    Number of Children: N/A  . Years of Education: N/A   Occupational History  . truck driver    Social History Main Topics  . Smoking status: Current Every Day Smoker -- 1.00 packs/day for 45 years    Types: Cigarettes  . Smokeless tobacco: Never Used  . Alcohol Use: Yes     Comment: 04/08/2013 "haven't drank nothing in > 3 yr; before then I'd have at least 1 pint/day"  . Drug Use: No  . Sexual Activity: Yes   Other Topics Concern  . Not on file   Social History Narrative     Review of Systems  Constitutional: Negative for fatigue and unexpected weight change.  Eyes: Negative for visual disturbance.  Respiratory: Negative for cough, chest tightness and shortness of breath.   Cardiovascular: Negative for chest pain, palpitations and leg swelling.  Gastrointestinal: Negative for abdominal pain and blood in stool.  Endocrine: Negative for polyuria.  Genitourinary: Positive for frequency.  Neurological: Negative for dizziness, light-headedness and headaches.     Objective:  Physical Exam  Constitutional: He is oriented to person, place, and time. He appears well-developed and well-nourished.  HENT:  Head: Normocephalic and atraumatic.  Eyes: EOM are normal. Pupils are equal, round, and reactive to light.  Neck: No JVD present. Carotid bruit is not present.  Cardiovascular: Normal rate, regular rhythm and normal heart sounds.  Exam reveals no gallop and no friction rub.   No murmur heard. Pulmonary/Chest: Effort normal and breath sounds normal.  No respiratory distress. He has no wheezes. He has no rales.  Abdominal: Soft. Bowel sounds are normal. He exhibits no distension and no mass. There is no tenderness. There is no rebound and no guarding.  Musculoskeletal: He exhibits no edema.  Neurological: He is alert and oriented to person, place, and time.  Skin: Skin is warm and dry.  Psychiatric: He has a normal mood and affect.  Vitals reviewed.    Filed Vitals:   09/22/14 1549  BP: 164/60  Pulse: 65  Temp: 98.2 F (36.8 C)  TempSrc: Oral  Resp: 16  Height: 5' 7.5" (1.715 m)  Weight: 195 lb (88.451 kg)  SpO2: 98%   Results for orders placed or performed in visit on 09/22/14  POCT glucose (manual entry)  Result Value Ref Range  POC Glucose 127 (A) 70 - 99 mg/dl  POCT glycosylated hemoglobin (Hb A1C)  Result Value Ref Range   Hemoglobin A1C 6.5      Assessment & Plan:   Dryden Tapley. is a 74 y.o. male Need for prophylactic vaccination and inoculation against influenza - Plan: Flu Vaccine QUAD 36+ mos IM  -   Diabetes type 2, controlled - Plan: POCT glucose (manual entry), POCT glycosylated hemoglobin (Hb A1C)  - stable, ok to cont diet control, but continue to work on diet.  Schedule optho visit to assess for retinopathy. Restart asa 5m qd. Info given in AVS on diabetes.   Essential hypertension - Plan: Basic metabolic panel, losartan-hydrochlorothiazide (HYZAAR) 100-12.5 MG per tablet,   - med nonadherent for past week., will restart at prior dose, and importance of eval on meds discussed. BMP pending. Check outside BP's on meds and if remain elevated - RTC for recheck.   Meds ordered this encounter  Medications  . DISCONTD: losartan-hydrochlorothiazide (HYZAAR) 100-12.5 MG per tablet    Sig: Take 0.5 tablets by mouth daily. PATIENT NEEDS OFFICE VISIT FOR ADDITIONAL REFILLS    Dispense:  15 tablet    Refill:  0  . losartan-hydrochlorothiazide (HYZAAR) 100-12.5 MG per tablet    Sig: Take 0.5 tablets  by mouth daily.    Dispense:  90 tablet    Refill:  0   Patient Instructions  Keep a record of your blood pressures  And blood sugars outside of the office and bring them to the next office visit. You should receive a call or letter about your lab results within the next week to 10 days.  Schedule eye doctor appointment.  Restart aspirin 89monce per day.  Diabetes still at a level for diet control - but work on diet changes as improvement in blood sugar would be helpful even at this level.    Diabetes and Standards of Medical Care Diabetes is complicated. You may find that your diabetes team includes a dietitian, nurse, diabetes educator, eye doctor, and more. To help everyone know what is going on and to help you get the care you deserve, the following schedule of care was developed to help keep you on track. Below are the tests, exams, vaccines, medicines, education, and plans you will need. HbA1c test This test shows how well you have controlled your glucose over the past 2-3 months. It is used to see if your diabetes management plan needs to be adjusted.   It is performed at least 2 times a year if you are meeting treatment goals.  It is performed 4 times a year if therapy has changed or if you are not meeting treatment goals. Blood pressure test  This test is performed at every routine medical visit. The goal is less than 140/90 mm Hg for most people, but 130/80 mm Hg in some cases. Ask your health care provider about your goal. Dental exam  Follow up with the dentist regularly. Eye exam  If you are diagnosed with type 1 diabetes as a child, get an exam upon reaching the age of 1054ears or older and have had diabetes for 3-5 years. Yearly eye exams are recommended after that initial eye exam.  If you are diagnosed with type 1 diabetes as an adult, get an exam within 5 years of diagnosis and then yearly.  If you are diagnosed with type 2 diabetes, get an exam as soon as possible  after the diagnosis and then  yearly. Foot care exam  Visual foot exams are performed at every routine medical visit. The exams check for cuts, injuries, or other problems with the feet.  A comprehensive foot exam should be done yearly. This includes visual inspection as well as assessing foot pulses and testing for loss of sensation.  Check your feet nightly for cuts, injuries, or other problems with your feet. Tell your health care provider if anything is not healing. Kidney function test (urine microalbumin)  This test is performed once a year.  Type 1 diabetes: The first test is performed 5 years after diagnosis.  Type 2 diabetes: The first test is performed at the time of diagnosis.  A serum creatinine and estimated glomerular filtration rate (eGFR) test is done once a year to assess the level of chronic kidney disease (CKD), if present. Lipid profile (cholesterol, HDL, LDL, triglycerides)  Performed every 5 years for most people.  The goal for LDL is less than 100 mg/dL. If you are at high risk, the goal is less than 70 mg/dL.  The goal for HDL is 40 mg/dL-50 mg/dL for men and 50 mg/dL-60 mg/dL for women. An HDL cholesterol of 60 mg/dL or higher gives some protection against heart disease.  The goal for triglycerides is less than 150 mg/dL. Influenza vaccine, pneumococcal vaccine, and hepatitis B vaccine  The influenza vaccine is recommended yearly.  It is recommended that people with diabetes who are over 40 years old get the pneumonia vaccine. In some cases, two separate shots may be given. Ask your health care provider if your pneumonia vaccination is up to date.  The hepatitis B vaccine is also recommended for adults with diabetes. Diabetes self-management education  Education is recommended at diagnosis and ongoing as needed. Treatment plan  Your treatment plan is reviewed at every medical visit. Document Released: 05/29/2009 Document Revised: 12/16/2013 Document  Reviewed: 01/01/2013 [facility] Patient Information 2015 [place], [place]. This information is not intended to replace advice given to you by your health care provider. Make sure you discuss any questions you have with your health care provider.      I personally performed the services described in this documentation, which was scribed in my presence. The recorded information has been reviewed and considered, and addended by me as needed.

## 2014-09-22 NOTE — Patient Instructions (Addendum)
Keep a record of your blood pressures  And blood sugars outside of the office and bring them to the next office visit. You should receive a call or letter about your lab results within the next week to 10 days.  Schedule eye doctor appointment.  Restart aspirin 10m once per day.  Diabetes still at a level for diet control - but work on diet changes as improvement in blood sugar would be helpful even at this level.    Diabetes and Standards of Medical Care Diabetes is complicated. You may find that your diabetes team includes a dietitian, nurse, diabetes educator, eye doctor, and more. To help everyone know what is going on and to help you get the care you deserve, the following schedule of care was developed to help keep you on track. Below are the tests, exams, vaccines, medicines, education, and plans you will need. HbA1c test This test shows how well you have controlled your glucose over the past 2-3 months. It is used to see if your diabetes management plan needs to be adjusted.   It is performed at least 2 times a year if you are meeting treatment goals.  It is performed 4 times a year if therapy has changed or if you are not meeting treatment goals. Blood pressure test  This test is performed at every routine medical visit. The goal is less than 140/90 mm Hg for most people, but 130/80 mm Hg in some cases. Ask your health care provider about your goal. Dental exam  Follow up with the dentist regularly. Eye exam  If you are diagnosed with type 1 diabetes as a child, get an exam upon reaching the age of 148years or older and have had diabetes for 3-5 years. Yearly eye exams are recommended after that initial eye exam.  If you are diagnosed with type 1 diabetes as an adult, get an exam within 5 years of diagnosis and then yearly.  If you are diagnosed with type 2 diabetes, get an exam as soon as possible after the diagnosis and then yearly. Foot care exam  Visual foot exams are  performed at every routine medical visit. The exams check for cuts, injuries, or other problems with the feet.  A comprehensive foot exam should be done yearly. This includes visual inspection as well as assessing foot pulses and testing for loss of sensation.  Check your feet nightly for cuts, injuries, or other problems with your feet. Tell your health care provider if anything is not healing. Kidney function test (urine microalbumin)  This test is performed once a year.  Type 1 diabetes: The first test is performed 5 years after diagnosis.  Type 2 diabetes: The first test is performed at the time of diagnosis.  A serum creatinine and estimated glomerular filtration rate (eGFR) test is done once a year to assess the level of chronic kidney disease (CKD), if present. Lipid profile (cholesterol, HDL, LDL, triglycerides)  Performed every 5 years for most people.  The goal for LDL is less than 100 mg/dL. If you are at high risk, the goal is less than 70 mg/dL.  The goal for HDL is 40 mg/dL-50 mg/dL for men and 50 mg/dL-60 mg/dL for women. An HDL cholesterol of 60 mg/dL or higher gives some protection against heart disease.  The goal for triglycerides is less than 150 mg/dL. Influenza vaccine, pneumococcal vaccine, and hepatitis B vaccine  The influenza vaccine is recommended yearly.  It is recommended that people with diabetes  who are over 49 years old get the pneumonia vaccine. In some cases, two separate shots may be given. Ask your health care provider if your pneumonia vaccination is up to date.  The hepatitis B vaccine is also recommended for adults with diabetes. Diabetes self-management education  Education is recommended at diagnosis and ongoing as needed. Treatment plan  Your treatment plan is reviewed at every medical visit. Document Released: 05/29/2009 Document Revised: 12/16/2013 Document Reviewed: 01/01/2013 [facility] Patient Information 2015 [place], [place]. This  information is not intended to replace advice given to you by your health care provider. Make sure you discuss any questions you have with your health care provider.

## 2014-09-23 LAB — BASIC METABOLIC PANEL WITH GFR
BUN: 12 mg/dL (ref 6–23)
CO2: 26 meq/L (ref 19–32)
Calcium: 9.6 mg/dL (ref 8.4–10.5)
Chloride: 102 meq/L (ref 96–112)
Creat: 0.92 mg/dL (ref 0.50–1.35)
Glucose, Bld: 95 mg/dL (ref 70–99)
Potassium: 5.1 meq/L (ref 3.5–5.3)
Sodium: 138 meq/L (ref 135–145)

## 2014-10-14 ENCOUNTER — Encounter: Payer: Self-pay | Admitting: Emergency Medicine

## 2014-10-29 ENCOUNTER — Other Ambulatory Visit: Payer: Self-pay

## 2014-10-29 DIAGNOSIS — I10 Essential (primary) hypertension: Secondary | ICD-10-CM

## 2014-10-29 MED ORDER — LOSARTAN POTASSIUM-HCTZ 100-12.5 MG PO TABS: 0.5000 | ORAL_TABLET | Freq: Every day | ORAL | Status: DC

## 2015-01-19 DIAGNOSIS — R3915 Urgency of urination: Secondary | ICD-10-CM | POA: Diagnosis not present

## 2015-01-19 DIAGNOSIS — R351 Nocturia: Secondary | ICD-10-CM | POA: Diagnosis not present

## 2015-01-19 DIAGNOSIS — N401 Enlarged prostate with lower urinary tract symptoms: Secondary | ICD-10-CM | POA: Diagnosis not present

## 2015-01-26 ENCOUNTER — Encounter: Payer: Self-pay | Admitting: Family Medicine

## 2015-01-26 ENCOUNTER — Ambulatory Visit (INDEPENDENT_AMBULATORY_CARE_PROVIDER_SITE_OTHER): Payer: Commercial Managed Care - HMO | Admitting: Family Medicine

## 2015-01-26 VITALS — BP 145/70 | HR 60 | Temp 98.4°F | Resp 16 | Ht 67.25 in | Wt 178.8 lb

## 2015-01-26 DIAGNOSIS — E119 Type 2 diabetes mellitus without complications: Secondary | ICD-10-CM | POA: Diagnosis not present

## 2015-01-26 DIAGNOSIS — Z23 Encounter for immunization: Secondary | ICD-10-CM | POA: Diagnosis not present

## 2015-01-26 DIAGNOSIS — N529 Male erectile dysfunction, unspecified: Secondary | ICD-10-CM | POA: Diagnosis not present

## 2015-01-26 DIAGNOSIS — Z720 Tobacco use: Secondary | ICD-10-CM

## 2015-01-26 DIAGNOSIS — I10 Essential (primary) hypertension: Secondary | ICD-10-CM

## 2015-01-26 DIAGNOSIS — Z0000 Encounter for general adult medical examination without abnormal findings: Secondary | ICD-10-CM

## 2015-01-26 DIAGNOSIS — I4510 Unspecified right bundle-branch block: Secondary | ICD-10-CM

## 2015-01-26 MED ORDER — TADALAFIL 5 MG PO TABS: 5.0000 mg | ORAL_TABLET | Freq: Every day | ORAL | Status: DC | PRN

## 2015-01-26 NOTE — Progress Notes (Signed)
Subjective:    Patient ID: Phillip Barrette., male    DOB: Nov 07, 1940, 74 y.o.   MRN: [id number] This chart was scribed for [name], MD by Zola Button, Medical Scribe. This patient was seen in Room 21 and the patient's care was started at 3:06 PM.    HPI HPI Comments: Phillip Dobratz. is a 74 y.o. male with a hx of hypertension and DM type II who presents to the [facility] for an annual Medicare physical exam. Last visit with me on February 8th. Patient is not fasting today.  Hypertension: Off medications for 1 week at that visit. Continued same medications, but to check home blood pressures. Patient has been compliant with his medications. He has been checking his blood pressure; his last reading was 129/78. Lab Results  Component Value Date   CREATININE 0.92 09/22/2014    Diabetes: He is not on any medications, diet-controlled. Recommended optho visit at last visit. Recommended to start aspirin 81 mg qd. He has started to exercise more, but has been eating more sweets and drinking more soda. Lab Results  Component Value Date   HGBA1C 6.5 09/22/2014    Wt Readings from Last 3 Encounters:  01/26/15 178 lb 12.8 oz (81.103 kg)  09/22/14 195 lb (88.451 kg)  02/03/14 189 lb 3.2 oz (85.821 kg)    GERD, Episodic: Patient takes Prevacid only as need, not every day. He denies blood in stool, nausea and vomiting.  Erectile Dysfunction: Patient still has some issues with erectile dysfunction. He has been using Cialis 5 mg when needed, about once a month, which has helped. He denies chest pain, headache, facial flushing, visual changes and hearing changes.  Cancer Screening:  Colon cancer screening - He has had 3 colonoscopies; his last one was done at [facility] and believes he had it in 2013 and is due in 2018. Prostate cancer screening - No results found for: PSA. Followed by Dr. Janice Norrie for BPH. Takes Flomax. He had testing last week. Lung cancer screening - He  is a smoker with 45 pack-year history. He reports smoking 0.5 ppd. He has not been screened for lung cancer yet. CT scan cost, possible follow-up and possible false positives were discussed; he would like to proceed with testing. Patient denies hemoptysis.  Immunizations:  Immunization History  Administered Date(s) Administered  . Influenza,inj,Quad PF,36+ Mos 05/27/2013, 09/22/2014  . Pneumococcal-Unspecified 04/16/2011  . Tdap 06/13/2011   Depression Screening:  Depression screen [facility] 2/9 01/26/2015 09/22/2014 09/22/2014 02/03/2014  Decreased Interest 0 0 0 0  Down, Depressed, Hopeless 0 0 0 0  PHQ - 2 Score 0 0 0 0   Fall Screening: No falls  Functional Status Screening: No positive responses on screening tool.  Dentist: His last dental appointment was 4 years ago. He does plan to make an appointment with a dentist.  [place] Provider: His last exam was about 3 years ago. He does plan to make an appointment for an eye exam.  Visual Acuity Screening   Right eye Left eye Both eyes  Without correction:     With correction: 20/20 20/15 20/15     Exercise: Patient has been exercising at the [facility] with Silver Sneakers. He reports exercising at least 150 minutes a week. He denies chest pain, SOB, and lightheadedness with exercise.  Advanced Directives: Full code and information on advanced directives given today.  Lab Results  Component Value Date   CHOL 159 01/28/2013   HDL  39* 01/28/2013   LDLCALC 97 01/28/2013   TRIG 114 01/28/2013   CHOLHDL 4.1 01/28/2013     Patient Active Problem List   Diagnosis Date Noted  . Severe sepsis(995.92) 04/05/2013  . Pyelonephritis 04/05/2013  . Hypertension 02/28/2013  . Type 2 diabetes mellitus 02/28/2013  . Hepatitis C virus infection 02/14/2011  . Urinary (tract) obstruction 02/14/2011  . TOBACCO ABUSE 12/10/2007  . 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 Blood transfusion 1970's  . Bronchitis   .  Hepatitis C   . Black lung   . Hypertension   . High cholesterol   . Type II diabetes mellitus   . GERD (gastroesophageal reflux disease)   . Asthma     "when I was a boy" (04/08/2013)   Past Surgical History  Procedure Laterality Date  . Tonsillectomy    . Incision and drainage of wound Right 1970's    "leg" (04/08/2013)  . Liver biopsy  ~ 2011   Allergies  Allergen Reactions  . Penicillins Anaphylaxis  . Shellfish Allergy Anaphylaxis   Prior to Admission medications   Medication Sig Start Date End Date Taking? Authorizing Provider  aspirin EC 81 MG tablet Take 81 mg by mouth daily.   Yes Historical Provider, MD  lansoprazole (PREVACID) 30 MG capsule Take 30 mg by mouth daily as needed (for GERD).   Yes Historical Provider, MD  losartan-hydrochlorothiazide (HYZAAR) 100-12.5 MG per tablet Take 0.5 tablets by mouth daily. 10/29/14  Yes Wendie Agreste, MD  tamsulosin (FLOMAX) 0.4 MG CAPS capsule Take 0.4 mg by mouth.   Yes Historical Provider, MD   History   Social History  . Marital Status: Married    Spouse Name: N/A  . Number of Children: N/A  . Years of Education: N/A   Occupational History  . truck driver    Social History Main Topics  . Smoking status: Current Every Day Smoker -- 1.00 packs/day for 45 years    Types: Cigarettes  . Smokeless tobacco: Never Used  . Alcohol Use: Yes     Comment: 04/08/2013 "haven't drank nothing in > 3 yr; before then I'd have at least 1 pint/day"  . Drug Use: No  . Sexual Activity: Yes   Other Topics Concern  . Not on file   Social History Narrative     Review of Systems 13 point ROS reviewed on patient health survey. Negative other than listed above or in nursing note. See nursing note.      Objective:   Physical Exam  Constitutional: He is oriented to person, place, and time. He appears well-developed and well-nourished.  HENT:  Head: Normocephalic and atraumatic.  Eyes: EOM are normal. Pupils are equal, round, and  reactive to light.  Neck: No JVD present. Carotid bruit is not present.  Cardiovascular: Normal rate, regular rhythm and normal heart sounds.   No murmur heard. Pulmonary/Chest: Effort normal and breath sounds normal. He has no rales.  Musculoskeletal: He exhibits no edema.  Neurological: He is alert and oriented to person, place, and time.  Skin: Skin is warm and dry.  Psychiatric: He has a normal mood and affect.  Vitals reviewed.  EKG - Sinus bradycardia 58. RBBB which appears new since October, 2012.   Filed Vitals:   01/26/15 1425  BP: 145/70  Pulse: 60  Temp: 98.4 F (36.9 C)  TempSrc: Oral  Resp: 16  Height: 5' 7.25" (1.708 m)  Weight: 178 lb 12.8 oz (  81.103 kg)  SpO2: 99%       Assessment & Plan:   Phillip Carll. is a 74 y.o. male Medicare annual wellness visit, subsequent  - -anticipatory guidance as below in AVS, screening labs above. Health maintenance items as above in HPI discussed/recommended as applicable.   Type 2 diabetes mellitus without complication - Plan: [facility] Diabetes Foot Exam, EKG 12-Lead, COMPLETE METABOLIC PANEL WITH GFR, Hemoglobin A1c, Lipid panel, Ambulatory referral to Cardiology, CANCELED: Lipid panel, CANCELED: Hemoglobin A1c  - diet controlled prior. Will check A1C  Essential hypertension - Plan: EKG 12-Lead, COMPLETE METABOLIC PANEL WITH GFR, Lipid panel, Ambulatory referral to Cardiology, CANCELED: COMPLETE METABOLIC PANEL WITH GFR, CANCELED: Lipid panel  -borderline, but home readings ok. No med changes.   Tobacco abuse - Plan: CT CHEST LUNG CA SCREEN LOW DOSE W/O CM, Ambulatory referral to Cardiology  Lung cancer screening discussed, including pros and cons of screening. All questions answered - wishes to proceed with low dose CT chest.   Erectile dysfunction, unspecified erectile dysfunction type - Plan: tadalafil (CIALIS) 5 MG tablet  -cialis Rx given - use lowest effective dose. Side effects discussed (including but not limited to  headache/flushing, blue discoloration of vision, possible vascular steal and risk of cardiac effects if underlying unknown coronary artery disease, and permanent sensorineural hearing loss). Understanding expressed.  Need for Streptococcus pneumoniae vaccination - Plan: Pneumococcal conjugate vaccine 13-valent IM given.   RBBB - Plan: Ambulatory referral to Cardiology  -asx.  Appears to be new dvt on EKG from last tracing. CArdiology eval as cardiac rf's of diabetes, HTN, age, and tobacco use. ER/911 chest pain precautions.   Meds ordered this encounter  Medications  . DISCONTD: tadalafil (CIALIS) 5 MG tablet    Sig: Take 1 tablet (5 mg total) by mouth daily as needed for erectile dysfunction.    Dispense:  30 tablet    Refill:  0   Patient Instructions  Return for fasting labs tomorrow. Return to the clinic or go to the nearest emergency room if any of your symptoms worsen or new symptoms occur.  Look at information on advanced directives.   We will try to schedule lung cancer screen and let you know.  [place] offers smoking cessation clinics. Registration is required. To register call [phone] or register online at [email or web address] Let me know if I can do anything to help when you are ready to quit.   Your EKG is somewhat different than in past - I will refer you to cardiology, but if any chest pain - go to emergency room.   Return to the clinic or go to the nearest emergency room if any of your symptoms worsen or new symptoms occur.  Keeping you healthy  Get these tests  Blood pressure- Have your blood pressure checked once a year by your healthcare provider.  Normal blood pressure is 120/80  Weight- Have your body mass index (BMI) calculated to screen for obesity.  BMI is a measure of body fat based on height and weight. You can also calculate your own BMI at [email or web address].  Cholesterol- Have your cholesterol checked every year.  Diabetes- Have your blood  sugar checked regularly if you have high blood pressure, high cholesterol, have a family history of diabetes or if you are overweight.  Screening for Colon Cancer- Colonoscopy starting at age 73.  Screening may begin sooner depending on your family history and other health conditions. Follow up colonoscopy as directed by your Gastroenterologist.  Screening for Prostate Cancer- Both blood work (PSA) and a rectal exam help screen for Prostate Cancer.  Screening begins at age 26 with African-American men and at age 34 with Caucasian men.  Screening may begin sooner depending on your family history.  Take these medicines  Aspirin- One aspirin daily can help prevent Heart disease and Stroke.  Flu shot- Every fall.  Tetanus- Every 10 years.  Zostavax- Once after the age of 72 to prevent Shingles.  Pneumonia shot- Once after the age of 82; if you are younger than 35, ask your healthcare provider if you need a Pneumonia shot.  Take these steps  Don't smoke- If you do smoke, talk to your doctor about quitting.  For tips on how to quit, go to www.smokefree.gov or call 1-800-QUIT-NOW.  Be physically active- Exercise 5 days a week for at least 30 minutes.  If you are not already physically active start slow and gradually work up to 30 minutes of moderate physical activity.  Examples of moderate activity include walking briskly, mowing the yard, dancing, swimming, bicycling, etc.  Eat a healthy diet- Eat a variety of healthy food such as fruits, vegetables, low fat milk, low fat cheese, yogurt, lean meant, poultry, fish, beans, tofu, etc. For more information go to www.thenutritionsource.org  Drink alcohol in moderation- Limit alcohol intake to less than two drinks a day. Never drink and drive.  Dentist- Brush and floss twice daily; visit your dentist twice a year.  Depression- Your emotional health is as important as your physical health. If you're feeling down, or losing interest in things you  would normally enjoy please talk to your healthcare provider.  Eye exam- Visit your eye doctor every year.  Safe sex- If you may be exposed to a sexually transmitted infection, use a condom.  Seat belts- Seat belts can save your life; always wear one.  Smoke/Carbon Monoxide detectors- These detectors need to be installed on the appropriate level of your home.  Replace batteries at least once a year.  Skin cancer- When out in the sun, cover up and use sunscreen 15 SPF or higher.  Violence- If anyone is threatening you, please tell your healthcare provider.  Living Will/ Health care power of attorney- Speak with your healthcare provider and family.    I personally performed the services described in this documentation, which was scribed in my presence. The recorded information has been reviewed and considered, and addended by me as needed.

## 2015-01-26 NOTE — Progress Notes (Signed)
   Subjective:    Patient ID: Phillip Maynard., male    DOB: [birth year], 74 y.o.   MRN: [id number]  HPI    Review of Systems  Constitutional: Negative.   HENT: Positive for dental problem.   Eyes: Negative.   Respiratory: Negative.   Cardiovascular: Negative.   Gastrointestinal: Negative.   Endocrine: Negative.   Genitourinary: Negative.   Musculoskeletal: Negative.   Skin: Negative.   Allergic/Immunologic: Negative.   Neurological: Negative.   Hematological: Negative.   Psychiatric/Behavioral: Negative.        Objective:   Physical Exam        Assessment & Plan:

## 2015-01-26 NOTE — Patient Instructions (Addendum)
Return for fasting labs tomorrow. Return to the clinic or go to the nearest emergency room if any of your symptoms worsen or new symptoms occur.  Look at information on advanced directives.   We will try to schedule lung cancer screen and let you know.  [place] offers smoking cessation clinics. Registration is required. To register call [phone] or register online at [email or web address] Let me know if I can do anything to help when you are ready to quit.   Your EKG is somewhat different than in past - I will refer you to cardiology, but if any chest pain - go to emergency room.   Return to the clinic or go to the nearest emergency room if any of your symptoms worsen or new symptoms occur.  Keeping you healthy  Get these tests  Blood pressure- Have your blood pressure checked once a year by your healthcare provider.  Normal blood pressure is 120/80  Weight- Have your body mass index (BMI) calculated to screen for obesity.  BMI is a measure of body fat based on height and weight. You can also calculate your own BMI at [email or web address].  Cholesterol- Have your cholesterol checked every year.  Diabetes- Have your blood sugar checked regularly if you have high blood pressure, high cholesterol, have a family history of diabetes or if you are overweight.  Screening for Colon Cancer- Colonoscopy starting at age 2.  Screening may begin sooner depending on your family history and other health conditions. Follow up colonoscopy as directed by your Gastroenterologist.  Screening for Prostate Cancer- Both blood work (PSA) and a rectal exam help screen for Prostate Cancer.  Screening begins at age 73 with African-American men and at age 66 with Caucasian men.  Screening may begin sooner depending on your family history.  Take these medicines  Aspirin- One aspirin daily can help prevent Heart disease and Stroke.  Flu shot- Every fall.  Tetanus- Every 10 years.  Zostavax- Once after  the age of 71 to prevent Shingles.  Pneumonia shot- Once after the age of 75; if you are younger than 110, ask your healthcare provider if you need a Pneumonia shot.  Take these steps  Don't smoke- If you do smoke, talk to your doctor about quitting.  For tips on how to quit, go to www.smokefree.gov or call 1-800-QUIT-NOW.  Be physically active- Exercise 5 days a week for at least 30 minutes.  If you are not already physically active start slow and gradually work up to 30 minutes of moderate physical activity.  Examples of moderate activity include walking briskly, mowing the yard, dancing, swimming, bicycling, etc.  Eat a healthy diet- Eat a variety of healthy food such as fruits, vegetables, low fat milk, low fat cheese, yogurt, lean meant, poultry, fish, beans, tofu, etc. For more information go to www.thenutritionsource.org  Drink alcohol in moderation- Limit alcohol intake to less than two drinks a day. Never drink and drive.  Dentist- Brush and floss twice daily; visit your dentist twice a year.  Depression- Your emotional health is as important as your physical health. If you're feeling down, or losing interest in things you would normally enjoy please talk to your healthcare provider.  Eye exam- Visit your eye doctor every year.  Safe sex- If you may be exposed to a sexually transmitted infection, use a condom.  Seat belts- Seat belts can save your life; always wear one.  Smoke/Carbon Monoxide detectors- These detectors need to be installed on the appropriate level of  your home.  Replace batteries at least once a year.  Skin cancer- When out in the sun, cover up and use sunscreen 15 SPF or higher.  Violence- If anyone is threatening you, please tell your healthcare provider.  Living Will/ Health care power of attorney- Speak with your healthcare provider and family.

## 2015-01-27 ENCOUNTER — Other Ambulatory Visit: Payer: Self-pay | Admitting: *Deleted

## 2015-01-27 ENCOUNTER — Other Ambulatory Visit (INDEPENDENT_AMBULATORY_CARE_PROVIDER_SITE_OTHER): Payer: Commercial Managed Care - HMO | Admitting: *Deleted

## 2015-01-27 DIAGNOSIS — I10 Essential (primary) hypertension: Secondary | ICD-10-CM | POA: Diagnosis not present

## 2015-01-27 DIAGNOSIS — N529 Male erectile dysfunction, unspecified: Secondary | ICD-10-CM

## 2015-01-27 DIAGNOSIS — E119 Type 2 diabetes mellitus without complications: Secondary | ICD-10-CM | POA: Diagnosis not present

## 2015-01-27 LAB — COMPLETE METABOLIC PANEL WITH GFR
ALBUMIN: 3.8 g/dL (ref 3.5–5.2)
ALT: 15 U/L (ref 0–53)
AST: 19 U/L (ref 0–37)
Alkaline Phosphatase: 50 U/L (ref 39–117)
BUN: 14 mg/dL (ref 6–23)
CALCIUM: 9.5 mg/dL (ref 8.4–10.5)
CHLORIDE: 103 meq/L (ref 96–112)
CO2: 28 mEq/L (ref 19–32)
Creat: 0.99 mg/dL (ref 0.50–1.35)
GFR, Est African American: 88 mL/min
GFR, Est Non African American: 76 mL/min
Glucose, Bld: 115 mg/dL — ABNORMAL HIGH (ref 70–99)
Potassium: 4.1 mEq/L (ref 3.5–5.3)
Sodium: 138 mEq/L (ref 135–145)
TOTAL PROTEIN: 7 g/dL (ref 6.0–8.3)
Total Bilirubin: 1 mg/dL (ref 0.2–1.2)

## 2015-01-27 LAB — LIPID PANEL
CHOL/HDL RATIO: 3.7 ratio
CHOLESTEROL: 152 mg/dL (ref 0–200)
HDL: 41 mg/dL (ref >=40)
LDL Cholesterol: 96 mg/dL (ref 0–99)
Triglycerides: 73 mg/dL (ref <150)
VLDL: 15 mg/dL (ref 0–40)

## 2015-01-27 LAB — HEMOGLOBIN A1C
HEMOGLOBIN A1C: 6.6 % — AB (ref <5.7)
MEAN PLASMA GLUCOSE: 143 mg/dL — AB (ref <117)

## 2015-01-27 MED ORDER — TADALAFIL 5 MG PO TABS: 5.0000 mg | ORAL_TABLET | Freq: Every day | ORAL | Status: DC | PRN

## 2015-02-09 ENCOUNTER — Other Ambulatory Visit: Payer: Self-pay

## 2015-03-26 DIAGNOSIS — E119 Type 2 diabetes mellitus without complications: Secondary | ICD-10-CM | POA: Diagnosis not present

## 2015-03-26 DIAGNOSIS — H524 Presbyopia: Secondary | ICD-10-CM | POA: Diagnosis not present

## 2015-03-26 DIAGNOSIS — H11153 Pinguecula, bilateral: Secondary | ICD-10-CM | POA: Diagnosis not present

## 2015-03-26 DIAGNOSIS — H52223 Regular astigmatism, bilateral: Secondary | ICD-10-CM | POA: Diagnosis not present

## 2015-03-26 DIAGNOSIS — H25093 Other age-related incipient cataract, bilateral: Secondary | ICD-10-CM | POA: Diagnosis not present

## 2015-03-26 DIAGNOSIS — I10 Essential (primary) hypertension: Secondary | ICD-10-CM | POA: Diagnosis not present

## 2015-03-26 DIAGNOSIS — H18413 Arcus senilis, bilateral: Secondary | ICD-10-CM | POA: Diagnosis not present

## 2015-03-26 DIAGNOSIS — H5203 Hypermetropia, bilateral: Secondary | ICD-10-CM | POA: Diagnosis not present

## 2015-04-01 ENCOUNTER — Ambulatory Visit: Payer: Self-pay | Admitting: Cardiovascular Disease

## 2015-04-10 ENCOUNTER — Encounter: Payer: Self-pay | Admitting: Family Medicine

## 2015-04-14 ENCOUNTER — Ambulatory Visit: Payer: Self-pay | Admitting: Cardiovascular Disease

## 2015-05-10 NOTE — Progress Notes (Signed)
Patient ID: Phillip Barrette., male   DOB: Jan 06, 1941, 74 y.o.   MRN: [id number]     Cardiology Office Note   Date:  05/11/2015   ID:  Phillip Barrette., DOB 05/19/1941, MRN [id number]  PCP:  Jenny Reichmann, MD  Cardiologist:   Jenkins Rouge, MD   No chief complaint on file.     History of Present Illness: Phillip Babington. is a 74 y.o. male who presents for evaluation of CAD risk.  Referred by primary Janeann Forehand Diet controlled DM, smoker, HTN Sedentary.  Echo 2014 with normal EF and moderate LAE.  A1c in June 6.6. LDL 01/27/15   96.  He eats too many donuts at [facility].  Sometimes gets muscular sounding pain in sternum  Not always exertional and sharp.  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  Has 3 young grand children.  Does push ups and walks usually with no issues Clearly struggles with his diet in regard to controlling his DM and cholesterol   Past Medical History  Diagnosis Date  . Blood transfusion 1970's  . Bronchitis   . Hepatitis C   . Black lung   . Hypertension   . High cholesterol   . Type II diabetes mellitus   . GERD (gastroesophageal reflux disease)   . Asthma     "when I was a boy" (04/08/2013)    Past Surgical History  Procedure Laterality Date  . Tonsillectomy    . Incision and drainage of wound Right 1970's    "leg" (04/08/2013)  . Liver biopsy  ~ 2011     Current Outpatient Prescriptions  Medication Sig Dispense Refill  . aspirin EC 81 MG tablet Take 81 mg by mouth daily.    . lansoprazole (PREVACID) 30 MG capsule Take 30 mg by mouth daily as needed (for GERD).    Marland Kitchen losartan-hydrochlorothiazide (HYZAAR) 100-12.5 MG per tablet Take 0.5 tablets by mouth daily. 45 tablet 1  . tamsulosin (FLOMAX) 0.4 MG CAPS capsule Take 0.4 mg by mouth daily.      No current facility-administered medications for this visit.    Allergies:   Penicillins and Shellfish allergy    Social History:  The patient  reports  that he has been smoking Cigarettes.  He has a 45 pack-year smoking history. He has never used smokeless tobacco. He reports that he drinks alcohol. He reports that he does not use illicit drugs.   Family History:  The patient's family history includes Asthma in his mother.    ROS:  Please see the history of present illness.   Otherwise, review of systems are positive for none.   All other systems are reviewed and negative.    PHYSICAL EXAM: VS:  BP 120/56 mmHg  Pulse 62  Ht 5\' 9"  (1.753 m)  Wt 82.101 kg (181 lb)  BMI 26.72 kg/m2 , BMI Body mass index is 26.72 kg/(m^2). Affect appropriate Healthy:  appears stated age 36: normal Neck supple with no adenopathy JVP normal no bruits no thyromegaly Lungs clear with no wheezing and good diaphragmatic motion Heart:  S1/S2 no murmur, no rub, gallop or click PMI normal Abdomen: benighn, BS positve, no tenderness, no AAA no bruit.  No HSM or HJR Distal pulses intact with no bruits No edema Neuro non-focal Skin warm and dry No muscular weakness    EKG:  01/26/15  SR RBBB no old to compare  Recent Labs: 01/27/2015: ALT 15; BUN 14; Creat 0.99; Potassium 4.1; Sodium 138    Lipid Panel    Component Value Date/Time   CHOL 152 01/27/2015 1652   TRIG 73 01/27/2015 1652   HDL 41 01/27/2015 1652   CHOLHDL 3.7 01/27/2015 1652   VLDL 15 01/27/2015 1652   LDLCALC 96 01/27/2015 1652      Wt Readings from Last 3 Encounters:  05/11/15 82.101 kg (181 lb)  01/26/15 81.103 kg (178 lb 12.8 oz)  09/22/14 88.451 kg (195 lb)      Other studies Reviewed: Additional studies/ records that were reviewed today include: Records from primary and old epic notes including echo.    ASSESSMENT AND PLAN:  1.  CRF:  Multiple risk factors atypical pain ECG RBBB  F/U ETT.  Strongly suggested that he get a coronary calcium score to further risk stratify and see if he should be on Rx For DM/cholesterol  He will try to come up with the $150's for  test.   2. HTN:  Well controlled.  Continue current medications and low sodium Dash type diet.   3. DM:  Diet controlled discussed low carb no donut diet target A1c under 6.5 4. Chol:  Target LDL under 130 but would like to get calcium score to further stratify   Current medicines are reviewed at length with the patient today.  The patient does not have concerns regarding medicines.  The following changes have been made:  no change  Labs/ tests ordered today include: ETT  Possible Calcium Score  No orders of the defined types were placed in this encounter.     Disposition:   FU with me in a year if ETT normal      Signed, Jenkins Rouge, MD  05/11/2015 3:56 PM    [facility] [place], [place], [place]  [address] Phone: [phone]; Fax: [phone]

## 2015-05-11 ENCOUNTER — Encounter: Payer: Self-pay | Admitting: Cardiovascular Disease

## 2015-05-11 ENCOUNTER — Ambulatory Visit (INDEPENDENT_AMBULATORY_CARE_PROVIDER_SITE_OTHER): Payer: Commercial Managed Care - HMO | Admitting: Cardiovascular Disease

## 2015-05-11 VITALS — BP 120/56 | HR 62 | Ht 69.0 in | Wt 181.0 lb

## 2015-05-11 DIAGNOSIS — R079 Chest pain, unspecified: Secondary | ICD-10-CM

## 2015-05-11 DIAGNOSIS — Z8709 Personal history of other diseases of the respiratory system: Secondary | ICD-10-CM

## 2015-05-11 NOTE — Patient Instructions (Signed)
Medication Instructions:  NO CHANGES  Labwork: NONE  Testing/Procedures: Your physician has requested that you have an exercise tolerance test. For further information please visit [email or web address]. Please also follow instruction sheet, as given.    Follow-Up: Your physician wants you to follow-up in: [place] will receive a reminder letter in the mail two months in advance. If you don't receive a letter, please call our office to schedule the follow-up appointment.  Any Other Special Instructions Will Be Listed Below (If Applicable).  CALL  IF  WISHES  TO HAVE  CALCIUM SCORE  Phillip Maynard   [phone]

## 2015-05-19 ENCOUNTER — Encounter: Payer: Self-pay | Admitting: Emergency Medicine

## 2015-05-28 ENCOUNTER — Encounter: Payer: Commercial Managed Care - HMO | Admitting: Nurse Practitioner

## 2015-05-28 ENCOUNTER — Ambulatory Visit (INDEPENDENT_AMBULATORY_CARE_PROVIDER_SITE_OTHER): Payer: Commercial Managed Care - HMO

## 2015-05-28 ENCOUNTER — Encounter: Payer: Self-pay | Admitting: Nurse Practitioner

## 2015-05-28 DIAGNOSIS — R079 Chest pain, unspecified: Secondary | ICD-10-CM

## 2015-05-28 LAB — EXERCISE TOLERANCE TEST
CSEPED: 9 min
CSEPEDS: 0 s
CSEPEW: 10.1 METS
CSEPPHR: 134 {beats}/min
MPHR: 148 {beats}/min
Percent HR: 91 %
RPE: 15
Rest HR: 60 {beats}/min

## 2015-07-21 ENCOUNTER — Other Ambulatory Visit: Payer: Self-pay | Admitting: Family Medicine

## 2015-09-04 ENCOUNTER — Other Ambulatory Visit: Payer: Self-pay | Admitting: Family Medicine

## 2015-09-23 ENCOUNTER — Ambulatory Visit (INDEPENDENT_AMBULATORY_CARE_PROVIDER_SITE_OTHER): Payer: Commercial Managed Care - HMO | Admitting: Family Medicine

## 2015-09-23 ENCOUNTER — Other Ambulatory Visit: Payer: Self-pay | Admitting: *Deleted

## 2015-09-23 ENCOUNTER — Encounter: Payer: Self-pay | Admitting: Family Medicine

## 2015-09-23 VITALS — BP 140/70 | HR 66 | Temp 98.7°F | Resp 16 | Ht 67.5 in | Wt 179.4 lb

## 2015-09-23 DIAGNOSIS — E119 Type 2 diabetes mellitus without complications: Secondary | ICD-10-CM | POA: Diagnosis not present

## 2015-09-23 DIAGNOSIS — R350 Frequency of micturition: Secondary | ICD-10-CM

## 2015-09-23 DIAGNOSIS — N400 Enlarged prostate without lower urinary tract symptoms: Secondary | ICD-10-CM

## 2015-09-23 DIAGNOSIS — I10 Essential (primary) hypertension: Secondary | ICD-10-CM

## 2015-09-23 DIAGNOSIS — N410 Acute prostatitis: Secondary | ICD-10-CM

## 2015-09-23 DIAGNOSIS — R3911 Hesitancy of micturition: Secondary | ICD-10-CM

## 2015-09-23 DIAGNOSIS — Z720 Tobacco use: Secondary | ICD-10-CM

## 2015-09-23 LAB — POCT URINALYSIS DIP (MANUAL ENTRY)
Bilirubin, UA: NEGATIVE
GLUCOSE UA: NEGATIVE
Leukocytes, UA: NEGATIVE
NITRITE UA: NEGATIVE
PH UA: 5
RBC UA: NEGATIVE
SPEC GRAV UA: 1.025
UROBILINOGEN UA: 0.2

## 2015-09-23 LAB — COMPLETE METABOLIC PANEL WITH GFR
ALT: 12 U/L (ref 9–46)
AST: 16 U/L (ref 10–35)
Albumin: 3.6 g/dL (ref 3.6–5.1)
Alkaline Phosphatase: 49 U/L (ref 40–115)
BILIRUBIN TOTAL: 0.5 mg/dL (ref 0.2–1.2)
BUN: 13 mg/dL (ref 7–25)
CHLORIDE: 99 mmol/L (ref 98–110)
CO2: 27 mmol/L (ref 20–31)
CREATININE: 0.98 mg/dL (ref 0.70–1.18)
Calcium: 9.5 mg/dL (ref 8.6–10.3)
GFR, Est African American: 89 mL/min (ref >=60)
GFR, Est Non African American: 77 mL/min (ref >=60)
GLUCOSE: 95 mg/dL (ref 65–99)
Potassium: 4.3 mmol/L (ref 3.5–5.3)
Sodium: 138 mmol/L (ref 135–146)
TOTAL PROTEIN: 7.6 g/dL (ref 6.1–8.1)

## 2015-09-23 LAB — POC MICROSCOPIC URINALYSIS (UMFC): Mucus: ABSENT

## 2015-09-23 LAB — LIPID PANEL
Cholesterol: 143 mg/dL (ref 125–200)
HDL: 32 mg/dL — ABNORMAL LOW (ref >=40)
LDL Cholesterol: 93 mg/dL (ref <130)
Total CHOL/HDL Ratio: 4.5 Ratio (ref <=5.0)
Triglycerides: 92 mg/dL (ref <150)
VLDL: 18 mg/dL (ref <30)

## 2015-09-23 LAB — GLUCOSE, POCT (MANUAL RESULT ENTRY): POC Glucose: 118 mg/dl — AB (ref 70–99)

## 2015-09-23 LAB — POCT GLYCOSYLATED HEMOGLOBIN (HGB A1C): Hemoglobin A1C: 6.4

## 2015-09-23 MED ORDER — CIPROFLOXACIN HCL 500 MG PO TABS: 500.0000 mg | ORAL_TABLET | Freq: Two times a day (BID) | ORAL | Status: DC

## 2015-09-23 MED ORDER — LOSARTAN POTASSIUM-HCTZ 100-12.5 MG PO TABS: 0.5000 | ORAL_TABLET | Freq: Every day | ORAL | Status: DC

## 2015-09-23 NOTE — Progress Notes (Signed)
Subjective:    Patient ID: Phillip Barrette., male    DOB: May 30, 1941, 75 y.o.   MRN: [id number] By signing my name below, I, Zola Button, attest that this documentation has been prepared under the direction and in the presence of [name], MD.  Electronically Signed: Zola Button, Medical Scribe. 09/23/2015. 2:57 PM.  HPI HPI Comments: Phillip Puello. is a 75 y.o. male who presents to the [facility] for a follow-up. He last ate around 7:30 AM this morning.  Hypertension: He takes 100-12.5 mg losartan-HCTZ. He had a stress test with Dr. Johnsie Cancel October 2016, normal. Cardiology visit September with Dr. Johnsie Cancel. Was recommended to have coronary calcium score. Patient notes he ran out of his medications 10 days ago and his recent blood pressure has been in the 147-154/72 range since then. On the medications, his blood pressure had been in the [id number] range systolic. Lab Results  Component Value Date   CREATININE 0.99 01/27/2015    Diabetes: Diet controlled. He is not on any diabetic medications. He checks his blood sugar at home and has been getting readings in the 98-120 range. Patient denies numbness. Wt Readings from Last 3 Encounters:  09/23/15 179 lb 6.4 oz (81.375 kg)  05/11/15 181 lb (82.101 kg)  01/26/15 178 lb 12.8 oz (81.103 kg)    Lab Results  Component Value Date   HGBA1C 6.6* 01/27/2015    BPH: He is on Flomax, followed by Dr. Janice Norrie. Had prostate testing last year by report in June. Patient notes his symptoms returned 4-5 days ago. Symptoms include urinary frequency/nocturia, difficulty urinating, and slight pain. Patient denies fever, nausea, vomiting, abdominal pain, back pain.  Dental pain: Patient states he has had some dental pain. He also reports having headache, which he believes could be related to the dental pain or his elevated blood pressure.  Patient Active Problem List   Diagnosis Date Noted  . Severe sepsis(995.92) 04/05/2013    . Pyelonephritis 04/05/2013  . Hypertension 02/28/2013  . Type 2 diabetes mellitus ([place]) 02/28/2013  . Hepatitis C virus infection 02/14/2011  . Urinary (tract) obstruction 02/14/2011  . TOBACCO ABUSE 12/10/2007  . 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 Blood transfusion 1970's  . Bronchitis   . Hepatitis C   . Black lung ([place])   . Hypertension   . High cholesterol   . Type II diabetes mellitus ([place])   . GERD (gastroesophageal reflux disease)   . Asthma     "when I was a boy" (04/08/2013)   Past Surgical History  Procedure Laterality Date  . Tonsillectomy    . Incision and drainage of wound Right 1970's    "leg" (04/08/2013)  . Liver biopsy  ~ 2011   Allergies  Allergen Reactions  . Penicillins Anaphylaxis  . Shellfish Allergy Anaphylaxis   Prior to Admission medications   Medication Sig Start Date End Date Taking? Authorizing Provider  aspirin EC 81 MG tablet Take 81 mg by mouth daily.    Historical Provider, MD  lansoprazole (PREVACID) 30 MG capsule Take 30 mg by mouth daily as needed (for GERD).    Historical Provider, MD  losartan-hydrochlorothiazide (HYZAAR) 100-12.5 MG tablet Take 0.5 tablets by mouth daily. PATIENT NEEDS OFFICE VISIT FOR ADDITIONAL REFILLS 07/22/15   Wendie Agreste, MD  tamsulosin [facility]) 0.4 MG CAPS capsule Take 0.4 mg by mouth daily.     Historical Provider, MD   Social  History   Social History  . Marital Status: Married    Spouse Name: N/A  . Number of Children: N/A  . Years of Education: N/A   Occupational History  . truck driver    Social History Main Topics  . Smoking status: Current Every Day Smoker -- 1.00 packs/day for 45 years    Types: Cigarettes  . Smokeless tobacco: Never Used  . Alcohol Use: Yes     Comment: 04/08/2013 "haven't drank nothing in > 3 yr; before then I'd have at least 1 pint/day"  . Drug Use: No  . Sexual Activity: Yes   Other Topics Concern  . Not on file   Social  History Narrative     Review of Systems  Constitutional: Negative for fever, fatigue and unexpected weight change.  HENT: Positive for dental problem.   Eyes: Negative for visual disturbance.  Respiratory: Negative for cough, chest tightness and shortness of breath.   Cardiovascular: Negative for chest pain, palpitations and leg swelling.  Gastrointestinal: Negative for nausea, vomiting, abdominal pain and blood in stool.  Genitourinary: Positive for frequency and difficulty urinating.  Musculoskeletal: Negative for back pain.  Neurological: Positive for headaches. Negative for dizziness, light-headedness and numbness.       Objective:   Physical Exam  Constitutional: He is oriented to person, place, and time. He appears well-developed and well-nourished.  HENT:  Head: Normocephalic and atraumatic.  Eyes: EOM are normal. Pupils are equal, round, and reactive to light.  Neck: No JVD present. Carotid bruit is not present.  Cardiovascular: Normal rate, regular rhythm and normal heart sounds.   No murmur heard. Pulmonary/Chest: Effort normal and breath sounds normal. He has no rales.  Abdominal: Soft. There is no tenderness. There is no CVA tenderness.  Genitourinary:  Prostate enlarged, no focal tenderness.  Musculoskeletal: He exhibits no edema.  Neurological: He is alert and oriented to person, place, and time.  Skin: Skin is warm and dry.  Psychiatric: He has a normal mood and affect.  Vitals reviewed.   Filed Vitals:   09/23/15 1426 09/23/15 1440  BP: 154/70 140/70  Pulse: 66   Temp: 98.7 F (37.1 C)   TempSrc: Oral   Resp: 16   Height: 5' 7.5" (1.715 m)   Weight: 179 lb 6.4 oz (81.375 kg)   SpO2: 97%     Results for orders placed or performed in visit on 09/23/15  POCT urinalysis dipstick  Result Value Ref Range   Color, UA yellow yellow   Clarity, UA clear clear   Glucose, UA negative negative   Bilirubin, UA negative negative   Ketones, POC UA trace (5) (A)  negative   Spec Grav, UA 1.025    Blood, UA negative negative   pH, UA 5.0    Protein Ur, POC =30 (A) negative   Urobilinogen, UA 0.2    Nitrite, UA Negative Negative   Leukocytes, UA Negative Negative  POCT Microscopic Urinalysis ([facility])  Result Value Ref Range   WBC,UR,HPF,POC None None WBC/hpf   RBC,UR,HPF,POC None None RBC/hpf   Bacteria None None, Too numerous to count   Mucus Absent Absent   Epithelial Cells, UR Per Microscopy None None, Too numerous to count cells/hpf  POCT glucose (manual entry)  Result Value Ref Range   POC Glucose 118 (A) 70 - 99 mg/dl  POCT glycosylated hemoglobin (Hb A1C)  Result Value Ref Range   Hemoglobin A1C 6.4         Assessment & Plan:  Phillip Barrette. is a 75 y.o. male Essential hypertension - Plan: Lipid panel, COMPLETE METABOLIC PANEL WITH GFR, losartan-hydrochlorothiazide (HYZAAR) 100-12.5 MG tablet  - Stable. No medication changes for now. CMP pending.  Type 2 diabetes, diet controlled ([place]) - Plan: POCT glucose (manual entry), POCT glycosylated hemoglobin (Hb A1C), Microalbumin, urine  -Stable with diet control. Continue to monitor diet and activity/exercise. Recheck A1c in the next 3-6 months.  Urinary hesitancy - Plan: POCT urinalysis dipstick, POCT Microscopic Urinalysis ([facility]), PSA Urinary frequency - Plan: POCT urinalysis dipstick, POCT Microscopic Urinalysis ([facility]), PSA BPH (benign prostatic hyperplasia) - Plan: POCT urinalysis dipstick, POCT Microscopic Urinalysis ([facility]), PSA, Urine culture, DISCONTINUED: ciprofloxacin (CIPRO) 500 MG tablet Acute prostatitis - Plan: Urine culture, DISCONTINUED: ciprofloxacin (CIPRO) 500 MG tablet  -History of BPH treated with Flomax, but recent urinary urgency, hesitancy, frequency concerning for possible early prostatitis.   -Will check urine culture, PSA  - start Cipro 500 mg twice a day for now, side effects discussed and tendon rupture or tendinopathy risks were discussed.   -Can also  call his urologist for follow-up for these symptoms. RTC/ER precautions were discussed including any signs or symptoms of urinary retention.  Tobacco abuse  -Cessation discussed, recommended CT screening for lung cancer as had been ordered previously. Orders are in the chart, so can call if he has difficulty scheduling this.  Meds ordered this encounter  Medications  . DISCONTD: ciprofloxacin (CIPRO) 500 MG tablet    Sig: Take 1 tablet (500 mg total) by mouth 2 (two) times daily.    Dispense:  20 tablet    Refill:  0  . losartan-hydrochlorothiazide (HYZAAR) 100-12.5 MG tablet    Sig: Take 0.5 tablets by mouth daily.    Dispense:  90 tablet    Refill:  0   Patient Instructions  Keep a record of your blood pressures outside of the office and if over 140/90 once you are back on your medicine - return for recheck and to adjust your medication.   You may have an early prostate infection that is causing the difficulty with urination.  I will check blood test for this, and you should call your urologist's office for follow up, but I will start an antibiotic for now. If any fever, abdominal pain, or unable to urinate - return here or emergency room as this could be a sign of urinary retention and you may need a catheter at that point.   There is still an order in the system for lung cancer screening cat scan.  Let us know if we can help with scheduling this. [facility] offers smoking cessation clinics. Registration is required. To register call [phone] or register online at [email or web address]  You should receive a call or letter about your lab results within the next week to 10 days.   Prostatitis The prostate gland is about the size and shape of a walnut. It is located just below your bladder. It produces one of the components of semen, which is made up of sperm and the fluids that help nourish and transport it out from the testicles. Prostatitis is inflammation of the prostate gland.  There  are four types of prostatitis:  Acute bacterial prostatitis. This is the least common type of prostatitis. It starts quickly and usually is associated with a bladder infection, high fever, and shaking chills. It can occur at any age.  Chronic bacterial prostatitis. This is a persistent bacterial infection in the prostate. It usually develops from repeated  acute bacterial prostatitis or acute bacterial prostatitis that was not properly treated. It can occur in men of any age but is most common in middle-aged men whose prostate has begun to enlarge. The symptoms are not as severe as those in acute bacterial prostatitis. Discomfort in the part of your body that is in front of your rectum and below your scrotum (perineum), lower abdomen, or in the head of your penis (glans) may represent your primary discomfort.  Chronic prostatitis (nonbacterial). This is the most common type of prostatitis. It is inflammation of the prostate gland that is not caused by a bacterial infection. The cause is unknown and may be associated with a viral infection or autoimmune disorder.  Prostatodynia (pelvic floor disorder). This is associated with increased muscular tone in the pelvis surrounding the prostate. CAUSES The causes of bacterial prostatitis are bacterial infection. The causes of the other types of prostatitis are unknown.  SYMPTOMS  Symptoms can vary depending upon the type of prostatitis that exists. There can also be overlap in symptoms. Possible symptoms for each type of prostatitis are listed below. Acute Bacterial Prostatitis  Painful urination.  Fever or chills.  Muscle or joint pains.  Low back pain.  Low abdominal pain.  Inability to empty bladder completely. Chronic Bacterial Prostatitis, Chronic Nonbacterial Prostatitis, and Prostatodynia  Sudden urge to urinate.  Frequent urination.  Difficulty starting urine stream.  Weak urine stream.  Discharge from the urethra.  Dribbling  after urination.  Rectal pain.  Pain in the testicles, penis, or tip of the penis.  Pain in the perineum.  Problems with sexual function.  Painful ejaculation.  Bloody semen. DIAGNOSIS  In order to diagnose prostatitis, your health care provider will ask about your symptoms. One or more urine samples will be taken and tested (urinalysis). If the urinalysis result is negative for bacteria, your health care provider may use a finger to feel your prostate (digital rectal exam). This exam helps your health care provider determine if your prostate is swollen and tender. It will also produce a specimen of semen that can be analyzed. TREATMENT  Treatment for prostatitis depends on the cause. If a bacterial infection is the cause, it can be treated with antibiotic medicine. In cases of chronic bacterial prostatitis, the use of antibiotics for up to 1 month or 6 weeks may be necessary. Your health care provider may instruct you to take sitz baths to help relieve pain. A sitz bath is a bath of hot water in which your hips and buttocks are under water. This relaxes the pelvic floor muscles and often helps to relieve the pressure on your prostate. HOME CARE INSTRUCTIONS   Take all medicines as directed by your health care provider.  Take sitz baths as directed by your health care provider. SEEK MEDICAL CARE IF:   Your symptoms get worse, not better.  You have a fever. SEEK IMMEDIATE MEDICAL CARE IF:   You have chills.  You feel nauseous or vomit.  You feel lightheaded or faint.  You are unable to urinate.  You have blood or blood clots in your urine. MAKE SURE YOU:  Understand these instructions.  Will watch your condition.  Will get help right away if you are not doing well or get worse.   This information is not intended to replace advice given to you by your health care provider. Make sure you discuss any questions you have with your health care provider.   Document Released:  07/29/2000 Document Revised:  08/22/2014 Document Reviewed: 02/18/2013 Elsevier Interactive Patient Education [employer].       I personally performed the services described in this documentation, which was scribed in my presence. The recorded information has been reviewed and considered, and addended by me as needed.

## 2015-09-23 NOTE — Patient Instructions (Signed)
Keep a record of your blood pressures outside of the office and if over 140/90 once you are back on your medicine - return for recheck and to adjust your medication.   You may have an early prostate infection that is causing the difficulty with urination.  I will check blood test for this, and you should call your urologist's office for follow up, but I will start an antibiotic for now. If any fever, abdominal pain, or unable to urinate - return here or emergency room as this could be a sign of urinary retention and you may need a catheter at that point.   There is still an order in the system for lung cancer screening cat scan.  Let us know if we can help with scheduling this. [place] offers smoking cessation clinics. Registration is required. To register call [phone] or register online at [email or web address]  You should receive a call or letter about your lab results within the next week to 10 days.   Prostatitis The prostate gland is about the size and shape of a walnut. It is located just below your bladder. It produces one of the components of semen, which is made up of sperm and the fluids that help nourish and transport it out from the testicles. Prostatitis is inflammation of the prostate gland.  There are four types of prostatitis:  Acute bacterial prostatitis. This is the least common type of prostatitis. It starts quickly and usually is associated with a bladder infection, high fever, and shaking chills. It can occur at any age.  Chronic bacterial prostatitis. This is a persistent bacterial infection in the prostate. It usually develops from repeated acute bacterial prostatitis or acute bacterial prostatitis that was not properly treated. It can occur in men of any age but is most common in middle-aged men whose prostate has begun to enlarge. The symptoms are not as severe as those in acute bacterial prostatitis. Discomfort in the part of your body that is in front of your rectum and  below your scrotum (perineum), lower abdomen, or in the head of your penis (glans) may represent your primary discomfort.  Chronic prostatitis (nonbacterial). This is the most common type of prostatitis. It is inflammation of the prostate gland that is not caused by a bacterial infection. The cause is unknown and may be associated with a viral infection or autoimmune disorder.  Prostatodynia (pelvic floor disorder). This is associated with increased muscular tone in the pelvis surrounding the prostate. CAUSES The causes of bacterial prostatitis are bacterial infection. The causes of the other types of prostatitis are unknown.  SYMPTOMS  Symptoms can vary depending upon the type of prostatitis that exists. There can also be overlap in symptoms. Possible symptoms for each type of prostatitis are listed below. Acute Bacterial Prostatitis  Painful urination.  Fever or chills.  Muscle or joint pains.  Low back pain.  Low abdominal pain.  Inability to empty bladder completely. Chronic Bacterial Prostatitis, Chronic Nonbacterial Prostatitis, and Prostatodynia  Sudden urge to urinate.  Frequent urination.  Difficulty starting urine stream.  Weak urine stream.  Discharge from the urethra.  Dribbling after urination.  Rectal pain.  Pain in the testicles, penis, or tip of the penis.  Pain in the perineum.  Problems with sexual function.  Painful ejaculation.  Bloody semen. DIAGNOSIS  In order to diagnose prostatitis, your health care provider will ask about your symptoms. One or more urine samples will be taken and tested (urinalysis). If the urinalysis result is  negative for bacteria, your health care provider may use a finger to feel your prostate (digital rectal exam). This exam helps your health care provider determine if your prostate is swollen and tender. It will also produce a specimen of semen that can be analyzed. TREATMENT  Treatment for prostatitis depends on the  cause. If a bacterial infection is the cause, it can be treated with antibiotic medicine. In cases of chronic bacterial prostatitis, the use of antibiotics for up to 1 month or 6 weeks may be necessary. Your health care provider may instruct you to take sitz baths to help relieve pain. A sitz bath is a bath of hot water in which your hips and buttocks are under water. This relaxes the pelvic floor muscles and often helps to relieve the pressure on your prostate. HOME CARE INSTRUCTIONS   Take all medicines as directed by your health care provider.  Take sitz baths as directed by your health care provider. SEEK MEDICAL CARE IF:   Your symptoms get worse, not better.  You have a fever. SEEK IMMEDIATE MEDICAL CARE IF:   You have chills.  You feel nauseous or vomit.  You feel lightheaded or faint.  You are unable to urinate.  You have blood or blood clots in your urine. MAKE SURE YOU:  Understand these instructions.  Will watch your condition.  Will get help right away if you are not doing well or get worse.   This information is not intended to replace advice given to you by your health care provider. Make sure you discuss any questions you have with your health care provider.   Document Released: 07/29/2000 Document Revised: 08/22/2014 Document Reviewed: 02/18/2013 Elsevier Interactive Patient Education [employer].

## 2015-09-24 LAB — MICROALBUMIN, URINE: MICROALB UR: 11.5 mg/dL

## 2015-09-24 LAB — PSA: PSA: 3.16 ng/mL (ref <=4.00)

## 2015-09-25 LAB — URINE CULTURE

## 2015-09-30 ENCOUNTER — Telehealth: Payer: Self-pay

## 2015-09-30 DIAGNOSIS — R350 Frequency of micturition: Secondary | ICD-10-CM

## 2015-09-30 DIAGNOSIS — N400 Enlarged prostate without lower urinary tract symptoms: Secondary | ICD-10-CM

## 2015-09-30 NOTE — Telephone Encounter (Signed)
Pt states he need to be referred to see a UROLOGIST and was told Dr Carlota Raspberry needed to contact his [employer] in order for him to go. Have an appt to see Dr Baruch Gouty 10/07/15 at 8:15 Please call pt at [phone] when done

## 2015-09-30 NOTE — Telephone Encounter (Signed)
It sounds like pt needs a new referral for urology. Okay to enter referral?

## 2015-09-30 NOTE — Telephone Encounter (Signed)
Ordered

## 2015-10-07 DIAGNOSIS — Z0000 Encounter for general adult medical examination without abnormal findings: Secondary | ICD-10-CM | POA: Diagnosis not present

## 2015-10-07 DIAGNOSIS — R351 Nocturia: Secondary | ICD-10-CM | POA: Diagnosis not present

## 2015-10-07 DIAGNOSIS — N401 Enlarged prostate with lower urinary tract symptoms: Secondary | ICD-10-CM | POA: Diagnosis not present

## 2015-10-27 ENCOUNTER — Ambulatory Visit: Payer: Commercial Managed Care - HMO | Admitting: Cardiovascular Disease

## 2016-01-04 DIAGNOSIS — R3989 Other symptoms and signs involving the genitourinary system: Secondary | ICD-10-CM | POA: Diagnosis not present

## 2016-01-04 DIAGNOSIS — Z0000 Encounter for general adult medical examination without abnormal findings: Secondary | ICD-10-CM | POA: Diagnosis not present

## 2016-01-04 DIAGNOSIS — N401 Enlarged prostate with lower urinary tract symptoms: Secondary | ICD-10-CM | POA: Diagnosis not present

## 2016-01-06 DIAGNOSIS — N401 Enlarged prostate with lower urinary tract symptoms: Secondary | ICD-10-CM | POA: Diagnosis not present

## 2016-01-14 DIAGNOSIS — R3989 Other symptoms and signs involving the genitourinary system: Secondary | ICD-10-CM | POA: Diagnosis not present

## 2016-01-14 DIAGNOSIS — K5732 Diverticulitis of large intestine without perforation or abscess without bleeding: Secondary | ICD-10-CM | POA: Diagnosis not present

## 2016-02-23 DIAGNOSIS — N401 Enlarged prostate with lower urinary tract symptoms: Secondary | ICD-10-CM | POA: Diagnosis not present

## 2016-02-23 DIAGNOSIS — R350 Frequency of micturition: Secondary | ICD-10-CM | POA: Diagnosis not present

## 2016-02-23 DIAGNOSIS — N321 Vesicointestinal fistula: Secondary | ICD-10-CM | POA: Diagnosis not present

## 2016-03-21 ENCOUNTER — Other Ambulatory Visit: Payer: Self-pay | Admitting: Family Medicine

## 2016-03-21 DIAGNOSIS — I10 Essential (primary) hypertension: Secondary | ICD-10-CM

## 2016-05-23 ENCOUNTER — Ambulatory Visit (INDEPENDENT_AMBULATORY_CARE_PROVIDER_SITE_OTHER): Payer: Commercial Managed Care - HMO | Admitting: Family Medicine

## 2016-05-23 VITALS — BP 124/80 | HR 60 | Temp 98.6°F | Resp 18 | Ht 67.5 in | Wt 183.0 lb

## 2016-05-23 DIAGNOSIS — N321 Vesicointestinal fistula: Secondary | ICD-10-CM | POA: Diagnosis not present

## 2016-05-23 DIAGNOSIS — N401 Enlarged prostate with lower urinary tract symptoms: Secondary | ICD-10-CM

## 2016-05-23 DIAGNOSIS — R300 Dysuria: Secondary | ICD-10-CM | POA: Diagnosis not present

## 2016-05-23 DIAGNOSIS — N3001 Acute cystitis with hematuria: Secondary | ICD-10-CM | POA: Diagnosis not present

## 2016-05-23 DIAGNOSIS — E119 Type 2 diabetes mellitus without complications: Secondary | ICD-10-CM

## 2016-05-23 DIAGNOSIS — Z23 Encounter for immunization: Secondary | ICD-10-CM

## 2016-05-23 DIAGNOSIS — R350 Frequency of micturition: Secondary | ICD-10-CM

## 2016-05-23 DIAGNOSIS — I10 Essential (primary) hypertension: Secondary | ICD-10-CM | POA: Diagnosis not present

## 2016-05-23 LAB — COMPLETE METABOLIC PANEL WITHOUT GFR
ALT: 14 U/L (ref 9–46)
AST: 17 U/L (ref 10–35)
Albumin: 4 g/dL (ref 3.6–5.1)
Alkaline Phosphatase: 47 U/L (ref 40–115)
BUN: 16 mg/dL (ref 7–25)
CO2: 30 mmol/L (ref 20–31)
Calcium: 9.6 mg/dL (ref 8.6–10.3)
Chloride: 102 mmol/L (ref 98–110)
Creat: 1.03 mg/dL (ref 0.70–1.18)
GFR, Est African American: 83 mL/min
GFR, Est Non African American: 72 mL/min
Glucose, Bld: 105 mg/dL — ABNORMAL HIGH (ref 65–99)
Potassium: 4.7 mmol/L (ref 3.5–5.3)
Sodium: 141 mmol/L (ref 135–146)
Total Bilirubin: 1.1 mg/dL (ref 0.2–1.2)
Total Protein: 7.6 g/dL (ref 6.1–8.1)

## 2016-05-23 LAB — POCT URINALYSIS DIP (MANUAL ENTRY)
BILIRUBIN UA: NEGATIVE
Bilirubin, UA: NEGATIVE
Glucose, UA: NEGATIVE
NITRITE UA: POSITIVE — AB
PH UA: 5.5
Spec Grav, UA: 1.02
UROBILINOGEN UA: 0.2

## 2016-05-23 LAB — POC MICROSCOPIC URINALYSIS (UMFC): MUCUS RE: ABSENT

## 2016-05-23 LAB — PSA: PSA: 0.9 ng/mL (ref <=4.0)

## 2016-05-23 MED ORDER — LOSARTAN POTASSIUM-HCTZ 100-12.5 MG PO TABS: 0.5000 | ORAL_TABLET | Freq: Every day | ORAL | 1 refills | Status: DC

## 2016-05-23 MED ORDER — CIPROFLOXACIN HCL 500 MG PO TABS: 500.0000 mg | ORAL_TABLET | Freq: Two times a day (BID) | ORAL | 0 refills | Status: DC

## 2016-05-23 NOTE — Patient Instructions (Addendum)
I would discuss the necessary surgery with the urologist, including the surgeon who will be performing the actual surgery. If you would like another opinion  - let me know and I will be happy to refer you.   No change in medications for now. Follow up with me in next 2 weeks to recheck urine/prostate test and fasting labs at that time. Can also discuss cough and scan at tat time as well.  Return to the clinic or go to the nearest emergency room if any of your symptoms worsen or new symptoms occur.  You do appear to have another urinary tract infection. Start the antibiotic Cipro twice per day, I will check a urine culture and prostate test. If your symptoms are not improving by the end of this week, or any worsening sooner, return here or emergency room if needed.  Return to the clinic or go to the nearest emergency room if any of your symptoms worsen or new symptoms occur.   Urinary Tract Infection Urinary tract infections (UTIs) can develop anywhere along your urinary tract. Your urinary tract is your body's drainage system for removing wastes and extra water. Your urinary tract includes two kidneys, two ureters, a bladder, and a urethra. Your kidneys are a pair of bean-shaped organs. Each kidney is about the size of your fist. They are located below your ribs, one on each side of your spine. CAUSES Infections are caused by microbes, which are microscopic organisms, including fungi, viruses, and bacteria. These organisms are so small that they can only be seen through a microscope. Bacteria are the microbes that most commonly cause UTIs. SYMPTOMS  Symptoms of UTIs may vary by age and gender of the patient and by the location of the infection. Symptoms in young women typically include a frequent and intense urge to urinate and a painful, burning feeling in the bladder or urethra during urination. Older women and men are more likely to be tired, shaky, and weak and have muscle aches and abdominal  pain. A fever may mean the infection is in your kidneys. Other symptoms of a kidney infection include pain in your back or sides below the ribs, nausea, and vomiting. DIAGNOSIS To diagnose a UTI, your caregiver will ask you about your symptoms. Your caregiver will also ask you to provide a urine sample. The urine sample will be tested for bacteria and white blood cells. White blood cells are made by your body to help fight infection. TREATMENT  Typically, UTIs can be treated with medication. Because most UTIs are caused by a bacterial infection, they usually can be treated with the use of antibiotics. The choice of antibiotic and length of treatment depend on your symptoms and the type of bacteria causing your infection. HOME CARE INSTRUCTIONS  If you were prescribed antibiotics, take them exactly as your caregiver instructs you. Finish the medication even if you feel better after you have only taken some of the medication.  Drink enough water and fluids to keep your urine clear or pale yellow.  Avoid caffeine, tea, and carbonated beverages. They tend to irritate your bladder.  Empty your bladder often. Avoid holding urine for long periods of time.  Empty your bladder before and after sexual intercourse.  After a bowel movement, women should cleanse from front to back. Use each tissue only once. SEEK MEDICAL CARE IF:   You have back pain.  You develop a fever.  Your symptoms do not begin to resolve within 3 days. [place]  CARE IF:   You have severe back pain or lower abdominal pain.  You develop chills.  You have nausea or vomiting.  You have continued burning or discomfort with urination. MAKE SURE YOU:   Understand these instructions.  Will watch your condition.  Will get help right away if you are not doing well or get worse.   This information is not intended to replace advice given to you by your health care provider. Make sure you discuss any questions  you have with your health care provider.   Document Released: 05/11/2005 Document Revised: 04/22/2015 Document Reviewed: 09/09/2011 Elsevier Interactive Patient Education 2016 [employer].   IF you received an x-ray today, you will receive an invoice from [facility]. Please contact [facility] at [phone] with questions or concerns regarding your invoice.   IF you received labwork today, you will receive an invoice from [employer]. Please contact [facility] at [phone] with questions or concerns regarding your invoice.   Our billing staff will not be able to assist you with questions regarding bills from these companies.  You will be contacted with the lab results as soon as they are available. The fastest way to get your results is to activate your My Chart account. Instructions are located on the last page of this paperwork. If you have not heard from [place] regarding the results in 2 weeks, please contact this office.

## 2016-05-23 NOTE — Progress Notes (Signed)
By signing my name below I, Tereasa Coop, attest that this documentation has been prepared under the direction and in the presence of Wendie Agreste, MD. Electonically Signed. Tereasa Coop, Scribe 05/23/2016 at 1:33 PM  Subjective:    Patient ID: Phillip Barrette., male    DOB: 1941/05/28, 75 y.o.   MRN: [id number]  Chief Complaint  Patient presents with  . Medication Refill    LOSARTAN  . Flu Vaccine  . Referral    PROSTATE    HPI Phillip Cluney. is a 75 y.o. male who presents to the [facility] for HTN medication refill.   Pt c/o urinary urgency for the past week. Urine has been cloudy. Pt denies any fevers. Pt has a mild cough that he thinks is due to his smoking.  HTN Lab Results  Component Value Date   CREATININE 0.98 09/23/2015  Pt takes a half pill of 100mg -12.5mg  losartan-HCTZ. Pt denies having any side effects, CP, SOB, dizziness, or HAs.   DM Diet controlled Lab Results  Component Value Date   HGBA1C 6.4 09/23/2015   Lab Results  Component Value Date   MICROALBUR 11.5 09/23/2015    BPH Takes flomax, was followed by Dr Janice Norrie urology. Pt's last PSA was 3.16 on 09/23/15. Pt states that Dr Janice Norrie is no longer at the same office and was last seen by a new urologist at the same office. 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   Lipid screening Last lipid panel was in February and was overall normal. Pt ate chinese food within the past 4 hrs.   Vision Pt states that he saw his eye doctor this year.  Dentist Pt states he needs to see a dentist this year and is not regularly followed by a dentist.   Patient Active Problem List   Diagnosis Date Noted  . Severe sepsis(995.92) 04/05/2013  . Pyelonephritis 04/05/2013  . Hypertension 02/28/2013  . Type 2 diabetes mellitus ([place]) 02/28/2013  . Hepatitis C virus infection 02/14/2011  .  Urinary (tract) obstruction 02/14/2011  . TOBACCO ABUSE 12/10/2007  . 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 Asthma    "when I was a boy" (04/08/2013)  . 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 Blood transfusion 1970's  . Bronchitis   . GERD (gastroesophageal reflux disease)   . Hepatitis C   . High cholesterol   . Hypertension   . Type II diabetes mellitus ([place])    Past Surgical History:  Procedure Laterality Date  . INCISION AND DRAINAGE OF WOUND Right 1970's   "leg" (04/08/2013)  . LIVER BIOPSY  ~ 2011  . TONSILLECTOMY     Allergies  Allergen Reactions  . Penicillins Anaphylaxis  . Shellfish Allergy Anaphylaxis   Prior to Admission medications   Medication Sig Start Date End Date Taking? Authorizing Provider  aspirin EC 81 MG tablet Take 81 mg by mouth daily.   Yes Historical Provider, MD  losartan-hydrochlorothiazide (HYZAAR) 100-12.5 MG tablet TAKE 1/2 TABLET EVERY DAY 03/22/16  Yes Darlyne Russian, MD  tamsulosin (FLOMAX) 0.4 MG CAPS capsule Take 0.4 mg by mouth daily. Reported on 09/23/2015   Yes Historical Provider, MD  lansoprazole (PREVACID) 30 MG capsule Take 30 mg by mouth daily as needed (for GERD).    Historical Provider, MD   Social  History   Social History  . Marital status: Married    Spouse name: N/A  . Number of children: N/A  . 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 Smoking status: Current Every Day Smoker    Packs/day: 1.00    Years: 45.00    Types: Cigarettes  . Smokeless tobacco: Never Used  . Alcohol use Yes     Comment: 04/08/2013 "haven't drank nothing in > 3 yr; before then I'd have at least 1 pint/day"  . Drug use: No  . Sexual activity: Yes   Other Topics Concern  . Not on file   Social History Narrative  . No narrative on file      Review of Systems  Constitutional: Negative for fatigue, fever and unexpected weight change.  Eyes: Negative  for visual disturbance.  Respiratory: Positive for cough. Negative for chest tightness and shortness of breath.   Cardiovascular: Negative for chest pain, palpitations and leg swelling.  Gastrointestinal: Negative for abdominal pain and blood in stool.  Genitourinary: Positive for urgency.  Neurological: Negative for dizziness, light-headedness and headaches.       Objective:   Physical Exam  Constitutional: He is oriented to person, place, and time. He appears well-developed and well-nourished.  HENT:  Head: Normocephalic and atraumatic.  Eyes: EOM are normal. Pupils are equal, round, and reactive to light.  Neck: No JVD present. Carotid bruit is not present.  Cardiovascular: Normal rate, regular rhythm and normal heart sounds.   No murmur heard. Pulmonary/Chest: Effort normal and breath sounds normal. He has no rales.  Musculoskeletal: He exhibits no edema.  Neurological: He is alert and oriented to person, place, and time.  Skin: Skin is warm and dry.  Psychiatric: He has a normal mood and affect.  Vitals reviewed.    Vitals:   05/23/16 1142  BP: 124/80  Pulse: 60  Resp: 18  Temp: 98.6 F (37 C)  TempSrc: Oral  SpO2: 98%  Weight: 183 lb (83 kg)  Height: 5' 7.5" (1.715 m)   Results for orders placed or performed in visit on 05/23/16  POCT urinalysis dipstick  Result Value Ref Range   Color, UA yellow yellow   Clarity, UA cloudy (A) clear   Glucose, UA negative negative   Bilirubin, UA negative negative   Ketones, POC UA negative negative   Spec Grav, UA 1.020    Blood, UA small (A) negative   pH, UA 5.5    Protein Ur, POC =30 (A) negative   Urobilinogen, UA 0.2    Nitrite, UA Positive (A) Negative   Leukocytes, UA small (1+) (A) Negative  POCT Microscopic Urinalysis ([facility])  Result Value Ref Range   WBC,UR,HPF,POC Many (A) None WBC/hpf   RBC,UR,HPF,POC Many (A) None RBC/hpf   Bacteria Many (A) None, Too numerous to count   Mucus Absent Absent   Epithelial  Cells, UR Per Microscopy None None, Too numerous to count cells/hpf         Assessment & Plan:     Phillip Bayuk. is a 75 y.o. 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 We'll start Cipro  500 mg twice a day, check urine culture, check PSA, RTC precautions given.  Need for prophylactic vaccination and inoculation against influenza - Plan: Flu Vaccine QUAD 36+ mos IM - flu vaccine  Diabetes mellitus type 2, diet-controlled ([place]) - Plan: Hemoglobin A1c  - Check A1c.  Essential hypertension - Plan: COMPLETE METABOLIC PANEL WITH GFR, losartan-hydrochlorothiazide (HYZAAR) 100-12.5 MG tablet  - Stable. Check CMP, continue same medications. Not fasting today, plan on fasting lipids at next visit.  Advised to follow-up in 2 weeks to recheck urinary symptoms, fasting lab visit and can discuss cough and previous discussion of possible CT scan with smoking history.  Meds ordered this encounter  Medications  . losartan-hydrochlorothiazide (HYZAAR) 100-12.5 MG tablet    Sig: Take 0.5 tablets by mouth daily.    Dispense:  45 tablet    Refill:  1  . ciprofloxacin (CIPRO) 500 MG tablet    Sig: Take 1 tablet (500 mg total) by mouth 2 (two) times daily.    Dispense:  20 tablet    Refill:  0   Patient Instructions    I would discuss the necessary surgery with the urologist, including the surgeon who will be performing the actual surgery. If you would like another opinion  - let me know and I will be happy to refer you.   No change in medications for now. Follow up with me in next 2 weeks to recheck urine/prostate test and fasting labs at that time.  Can also discuss cough and scan at tat time as well.  Return to the clinic or go to the nearest emergency room if any of your symptoms worsen or new symptoms occur.  You do appear to have another urinary tract infection. Start the antibiotic Cipro twice per day, I will check a urine culture and prostate test. If your symptoms are not improving by the end of this week, or any worsening sooner, return here or emergency room if needed.  Return to the clinic or go to the nearest emergency room if any of your symptoms worsen or new symptoms occur.   Urinary Tract Infection Urinary tract infections (UTIs) can develop anywhere along your urinary tract. Your urinary tract is your body's drainage system for removing wastes and extra water. Your urinary tract includes two kidneys, two ureters, a bladder, and a urethra. Your kidneys are a pair of bean-shaped organs. Each kidney is about the size of your fist. They are located below your ribs, one on each side of your spine. CAUSES Infections are caused by microbes, which are microscopic organisms, including fungi, viruses, and bacteria. These organisms are so small that they can only be seen through a microscope. Bacteria are the microbes that most commonly cause UTIs. SYMPTOMS  Symptoms of UTIs may vary by age and gender of the patient and by the location of the infection. Symptoms in young women typically include a frequent and intense urge to urinate and a painful, burning feeling in the bladder or urethra during urination. Older women and men are more likely to be tired, shaky, and weak and have muscle aches and abdominal pain. A fever may mean the infection is in your kidneys. Other symptoms of a kidney infection include pain in your back or sides below the ribs, nausea, and vomiting. DIAGNOSIS To diagnose a UTI, your caregiver will ask you about your symptoms. Your caregiver will also ask you to provide a urine sample. The urine sample will be tested for  bacteria and white blood  cells. White blood cells are made by your body to help fight infection. TREATMENT  Typically, UTIs can be treated with medication. Because most UTIs are caused by a bacterial infection, they usually can be treated with the use of antibiotics. The choice of antibiotic and length of treatment depend on your symptoms and the type of bacteria causing your infection. HOME CARE INSTRUCTIONS  If you were prescribed antibiotics, take them exactly as your caregiver instructs you. Finish the medication even if you feel better after you have only taken some of the medication.  Drink enough water and fluids to keep your urine clear or pale yellow.  Avoid caffeine, tea, and carbonated beverages. They tend to irritate your bladder.  Empty your bladder often. Avoid holding urine for long periods of time.  Empty your bladder before and after sexual intercourse.  After a bowel movement, women should cleanse from front to back. Use each tissue only once. SEEK MEDICAL CARE IF:   You have back pain.  You develop a fever.  Your symptoms do not begin to resolve within 3 days. SEEK IMMEDIATE MEDICAL CARE IF:   You have severe back pain or lower abdominal pain.  You develop chills.  You have nausea or vomiting.  You have continued burning or discomfort with urination. MAKE SURE YOU:   Understand these instructions.  Will watch your condition.  Will get help right away if you are not doing well or get worse.   This information is not intended to replace advice given to you by your health care provider. Make sure you discuss any questions you have with your health care provider.   Document Released: 05/11/2005 Document Revised: 04/22/2015 Document Reviewed: 09/09/2011 Elsevier Interactive Patient Education 2016 [employer].   IF you received an x-ray today, you will receive an invoice from [facility]. Please contact [facility] at [phone] with  questions or concerns regarding your invoice.   IF you received labwork today, you will receive an invoice from [employer]. Please contact [facility] at [phone] with questions or concerns regarding your invoice.   Our billing staff will not be able to assist you with questions regarding bills from these companies.  You will be contacted with the lab results as soon as they are available. The fastest way to get your results is to activate your My Chart account. Instructions are located on the last page of this paperwork. If you have not heard from [place] regarding the results in 2 weeks, please contact this office.       I personally performed the services described in this documentation, which was scribed in my presence. The recorded information has been reviewed and considered, and addended by me as needed.   Signed,   [name], MD [facility].  05/23/16 1:49 PM

## 2016-05-24 LAB — HEMOGLOBIN A1C
HEMOGLOBIN A1C: 6.7 % — AB (ref <5.7)
Mean Plasma Glucose: 146 mg/dL

## 2016-05-25 LAB — URINE CULTURE

## 2016-06-09 ENCOUNTER — Encounter: Payer: Self-pay | Admitting: Family Medicine

## 2016-06-09 ENCOUNTER — Ambulatory Visit (INDEPENDENT_AMBULATORY_CARE_PROVIDER_SITE_OTHER): Payer: Commercial Managed Care - HMO | Admitting: Family Medicine

## 2016-06-09 VITALS — BP 126/80 | HR 88 | Temp 97.7°F | Resp 18 | Ht 67.5 in | Wt 182.0 lb

## 2016-06-09 DIAGNOSIS — Z13220 Encounter for screening for lipoid disorders: Secondary | ICD-10-CM | POA: Diagnosis not present

## 2016-06-09 DIAGNOSIS — N321 Vesicointestinal fistula: Secondary | ICD-10-CM

## 2016-06-09 DIAGNOSIS — Z79899 Other long term (current) drug therapy: Secondary | ICD-10-CM | POA: Diagnosis not present

## 2016-06-09 DIAGNOSIS — N390 Urinary tract infection, site not specified: Secondary | ICD-10-CM | POA: Diagnosis not present

## 2016-06-09 DIAGNOSIS — R350 Frequency of micturition: Secondary | ICD-10-CM | POA: Diagnosis not present

## 2016-06-09 LAB — POCT URINALYSIS DIP (MANUAL ENTRY)
Bilirubin, UA: NEGATIVE
GLUCOSE UA: NEGATIVE
Ketones, POC UA: NEGATIVE
NITRITE UA: NEGATIVE
PROTEIN UA: NEGATIVE
SPEC GRAV UA: 1.025
UROBILINOGEN UA: 0.2
pH, UA: 5

## 2016-06-09 LAB — LIPID PANEL
CHOL/HDL RATIO: 4.4 ratio (ref <=5.0)
CHOLESTEROL: 168 mg/dL (ref 125–200)
HDL: 38 mg/dL — ABNORMAL LOW (ref >=40)
LDL Cholesterol: 103 mg/dL (ref <130)
TRIGLYCERIDES: 137 mg/dL (ref <150)
VLDL: 27 mg/dL (ref <30)

## 2016-06-09 NOTE — Patient Instructions (Addendum)
Call urologist's office to ask about the referral to surgeon about the possible fistula repair between colon and bladder.   For current infection, continue current antibiotic twice per day until completed, then follow-up with me within a few days of completion of antibiotic to make sure things have improved. If any fevers, nausea, vomiting, abdominal pain, flank pain, or other worsening symptoms, return sooner.  I will check your cholesterol today as you are fasting and will let you know if you need any medication. For diabetes and for cholesterol, exercise can be helpful, and try to avoid sweets. Follow-up with me after completion of antibiotic. Return to the clinic or go to the nearest emergency room if any of your symptoms worsen or new symptoms occur.   Urinary Tract Infection Urinary tract infections (UTIs) can develop anywhere along your urinary tract. Your urinary tract is your body's drainage system for removing wastes and extra water. Your urinary tract includes two kidneys, two ureters, a bladder, and a urethra. Your kidneys are a pair of bean-shaped organs. Each kidney is about the size of your fist. They are located below your ribs, one on each side of your spine. CAUSES Infections are caused by microbes, which are microscopic organisms, including fungi, viruses, and bacteria. These organisms are so small that they can only be seen through a microscope. Bacteria are the microbes that most commonly cause UTIs. SYMPTOMS  Symptoms of UTIs may vary by age and gender of the patient and by the location of the infection. Symptoms in young women typically include a frequent and intense urge to urinate and a painful, burning feeling in the bladder or urethra during urination. Older women and men are more likely to be tired, shaky, and weak and have muscle aches and abdominal pain. A fever may mean the infection is in your kidneys. Other symptoms of a kidney infection include pain in your back or  sides below the ribs, nausea, and vomiting. DIAGNOSIS To diagnose a UTI, your caregiver will ask you about your symptoms. Your caregiver will also ask you to provide a urine sample. The urine sample will be tested for bacteria and white blood cells. White blood cells are made by your body to help fight infection. TREATMENT  Typically, UTIs can be treated with medication. Because most UTIs are caused by a bacterial infection, they usually can be treated with the use of antibiotics. The choice of antibiotic and length of treatment depend on your symptoms and the type of bacteria causing your infection. HOME CARE INSTRUCTIONS  If you were prescribed antibiotics, take them exactly as your caregiver instructs you. Finish the medication even if you feel better after you have only taken some of the medication.  Drink enough water and fluids to keep your urine clear or pale yellow.  Avoid caffeine, tea, and carbonated beverages. They tend to irritate your bladder.  Empty your bladder often. Avoid holding urine for long periods of time.  Empty your bladder before and after sexual intercourse.  After a bowel movement, women should cleanse from front to back. Use each tissue only once. SEEK MEDICAL CARE IF:   You have back pain.  You develop a fever.  Your symptoms do not begin to resolve within 3 days. SEEK IMMEDIATE MEDICAL CARE IF:   You have severe back pain or lower abdominal pain.  You develop chills.  You have nausea or vomiting.  You have continued burning or discomfort with urination. MAKE SURE YOU:   Understand these instructions.  Will watch your condition.  Will get help right away if you are not doing well or get worse.   This information is not intended to replace advice given to you by your health care provider. Make sure you discuss any questions you have with your health care provider.   Document Released: 05/11/2005 Document Revised: 04/22/2015 Document Reviewed:  09/09/2011 Elsevier Interactive Patient Education 2016 [employer].   IF you received an x-ray today, you will receive an invoice from [facility]. Please contact [facility] at [phone] with questions or concerns regarding your invoice.   IF you received labwork today, you will receive an invoice from [employer]. Please contact [facility] at [phone] with questions or concerns regarding your invoice.   Our billing staff will not be able to assist you with questions regarding bills from these companies.  You will be contacted with the lab results as soon as they are available. The fastest way to get your results is to activate your My Chart account. Instructions are located on the last page of this paperwork. If you have not heard from [place] regarding the results in 2 weeks, please contact this office.

## 2016-06-09 NOTE — Progress Notes (Signed)
Subjective:    Patient ID: Phillip Barrette., male    DOB: 09-Mar-1941, 75 y.o.   MRN: [id number] Phillip Soisson. is a 76 y.o. male  HPI  Patient here for follow-up of urinary tract infection. See last visit on October 9th. 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 History of underlying BPH and lower urinary tract infection symptoms for approximate 7-10 days at last visit. He was started on Cipro 500 mg twice a day to 10 days for UTI. Urine culture positive for Klebsiella that was sensitive to Cipro. PSA was normal at 0.9, and significantly improved from 8 months prior when it was 3.16. Renal function normal that visit.  Per pt - had not started antibiotic until 4 days ago.  Prescription had been sent to mail order. No fever, still some urinary frequency and urgency.  No n/v or abd pain.  No new side effects. Day #4/10 of abx.   Hx of Dm2, diet controlled - based on last A1c last visit. Not taking ASA - forgets to take.  Not currently on statin. Not fasting last visit, but is today to recheck hyperlipidemia. working multiple shifts at work - less exercise.    Patient Active Problem List   Diagnosis Date Noted  . Severe sepsis(995.92) 04/05/2013  . Pyelonephritis 04/05/2013  . Hypertension 02/28/2013  . Type 2 diabetes mellitus ([place]) 02/28/2013  . Hepatitis C virus infection 02/14/2011  . Urinary (tract) obstruction 02/14/2011  . TOBACCO ABUSE 12/10/2007  . 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 Asthma    "when I was a boy" (04/08/2013)  . 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 Blood transfusion 1970's  . Bronchitis   . GERD (gastroesophageal reflux disease)   . Hepatitis C   . High cholesterol   . Hypertension   . Type II diabetes mellitus ([place])    Past Surgical History:  Procedure Laterality Date  . INCISION AND DRAINAGE OF WOUND Right 1970's   "leg" (04/08/2013)  . LIVER BIOPSY  ~ 2011  . TONSILLECTOMY     Allergies  Allergen Reactions  . Penicillins Anaphylaxis  . Shellfish Allergy Anaphylaxis   Prior to Admission medications   Medication Sig Start Date End Date Taking? Authorizing Provider  aspirin EC 81 MG tablet Take 81 mg by mouth daily.   Yes Historical Provider, MD  ciprofloxacin (CIPRO) 500 MG tablet Take 1 tablet (500 mg total) by mouth 2 (two) times daily. 05/23/16  Yes Wendie Agreste, MD  losartan-hydrochlorothiazide (HYZAAR) 100-12.5 MG tablet Take 0.5 tablets by mouth daily. 05/23/16  Yes Wendie Agreste, MD  tamsulosin (FLOMAX) 0.4 MG CAPS capsule Take 0.4 mg by mouth daily. Reported on 09/23/2015   Yes Historical Provider, MD   Social History   Social History  . Marital status: Married    Spouse name: N/A  . Number of children: N/A  . 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 Smoking status: Current Every Day Smoker    Packs/day: 1.00    Years: 45.00    Types: Cigarettes  . Smokeless tobacco: Never Used  . Alcohol use Yes     Comment: 04/08/2013 "haven't drank nothing in > 3 yr; before then I'd have at least 1 pint/day"  . Drug use: No  .  Sexual activity: Yes   Other Topics Concern  . Not on file   Social History Narrative  . No narrative on file     Review of Systems  Constitutional: Negative for chills and fever.  Gastrointestinal: Negative for abdominal pain, nausea and vomiting.  Genitourinary: Positive for frequency and urgency. Negative for difficulty urinating, dysuria, flank pain and hematuria.  Musculoskeletal: Negative for arthralgias (no new ARTHRALGIAS).       Objective:   Physical Exam  Constitutional: He is oriented to person, place, and time. He appears well-developed and well-nourished.  HENT:  Head: Normocephalic and atraumatic.  Eyes: EOM are normal. Pupils are equal, round, and reactive to light.    Neck: No JVD present. Carotid bruit is not present.  Cardiovascular: Normal rate, regular rhythm and normal heart sounds.   No murmur heard. Pulmonary/Chest: Effort normal and breath sounds normal. He has no rales.  Abdominal: Soft. Bowel sounds are normal. He exhibits no distension. There is no tenderness. There is no CVA tenderness.  Musculoskeletal: He exhibits no edema.  Neurological: He is alert and oriented to person, place, and time.  Skin: Skin is warm and dry.  Psychiatric: He has a normal mood and affect.  Vitals reviewed.  Vitals:   06/09/16 1306  BP: 126/80  Pulse: 88  Resp: 18  Temp: 97.7 F (36.5 C)  TempSrc: Oral  SpO2: 97%  Weight: 182 lb (82.6 kg)  Height: 5' 7.5" (1.715 m)      Assessment & Plan:   Phillip Ossman. is a 75 y.o. male Urinary frequency - Plan: POCT urinalysis dipstick, CANCELED: Microalbumin, urine Urine frequency Urinary tract infection without hematuria, site unspecified - Plan: POCT UA - Microscopic Only  - Now on antibiotic for 4 days due to some confusion on medication from last visit. Apparently this was sent to his mail order pharmacy inadvertently. Advised to let me know in the future if this happens with antibiotic, as would want to start that same day if possible. He does not have any abdominal pain, nausea, vomiting, fever or other concerning symptoms. Will repeat urine testing, but plan on recheck after treatment of UTI. Previous PSA was reassuring. 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 Let me know if I need to place referral.   Screening for hyperlipidemia - Plan: Lipid panel  CVF (colovesical fistula)  - As above, reported fistula per his discussion with urologist. Scan is not [facility] in [facility], will follow-up to look at notes from urology.    Signed,   [name], MD [facility].  06/10/16 4:52 PM                                       No orders of the defined types were placed in this encounter.  Patient Instructions    Call urologist's office to ask about the referral to surgeon about the possible fistula repair between colon and bladder.   For current infection, continue current antibiotic twice per day until completed, then follow-up with me within a few days of completion of antibiotic to make sure things have improved. If any fevers, nausea, vomiting, abdominal pain, flank pain, or other worsening symptoms, return sooner.  I will check your cholesterol today  as you are fasting and will let you know if you need any medication. For diabetes and for cholesterol, exercise can be helpful, and try to avoid sweets. Follow-up with me after completion of antibiotic. Return to the clinic or go to the nearest emergency room if any of your symptoms worsen or new symptoms occur.   Urinary Tract Infection Urinary tract infections (UTIs) can develop anywhere along your urinary tract. Your urinary tract is your body's drainage system for removing wastes and extra water. Your urinary tract includes two kidneys, two ureters, a bladder, and a urethra. Your kidneys are a pair of bean-shaped organs. Each kidney is about the size of your fist. They are located below your ribs, one on each side of your spine. CAUSES Infections are caused by microbes, which are microscopic organisms, including fungi, viruses, and bacteria. These organisms are so small that they can only be seen through a microscope. Bacteria are the microbes that most commonly cause UTIs. SYMPTOMS  Symptoms of UTIs may vary by age and gender of the patient and by the location of the infection. Symptoms in young women typically include a frequent and intense urge to urinate and a painful, burning feeling in the bladder or urethra during urination. Older  women and men are more likely to be tired, shaky, and weak and have muscle aches and abdominal pain. A fever may mean the infection is in your kidneys. Other symptoms of a kidney infection include pain in your back or sides below the ribs, nausea, and vomiting. DIAGNOSIS To diagnose a UTI, your caregiver will ask you about your symptoms. Your caregiver will also ask you to provide a urine sample. The urine sample will be tested for bacteria and white blood cells. White blood cells are made by your body to help fight infection. TREATMENT  Typically, UTIs can be treated with medication. Because most UTIs are caused by a bacterial infection, they usually can be treated with the use of antibiotics. The choice of antibiotic and length of treatment depend on your symptoms and the type of bacteria causing your infection. HOME CARE INSTRUCTIONS  If you were prescribed antibiotics, take them exactly as your caregiver instructs you. Finish the medication even if you feel better after you have only taken some of the medication.  Drink enough water and fluids to keep your urine clear or pale yellow.  Avoid caffeine, tea, and carbonated beverages. They tend to irritate your bladder.  Empty your bladder often. Avoid holding urine for long periods of time.  Empty your bladder before and after sexual intercourse.  After a bowel movement, women should cleanse from front to back. Use each tissue only once. SEEK MEDICAL CARE IF:   You have back pain.  You develop a fever.  Your symptoms do not begin to resolve within 3 days. SEEK IMMEDIATE MEDICAL CARE IF:   You have severe back pain or lower abdominal pain.  You develop chills.  You have nausea or vomiting.  You have continued burning or discomfort with urination. MAKE SURE YOU:   Understand these instructions.  Will watch your condition.  Will get help right away if you are not doing well or get worse.   This information is not intended to  replace advice given to you by your health care provider. Make sure you discuss any questions you have with your health care provider.   Document Released: 05/11/2005 Document Revised: 04/22/2015 Document Reviewed: 09/09/2011 Elsevier Interactive Patient Education [employer].  IF you received an x-ray today, you will receive an invoice from [facility]. Please contact [facility] Radiology at [phone] with questions or concerns regarding your invoice.   IF you received labwork today, you will receive an invoice from [employer]. Please contact [facility] at [phone] with questions or concerns regarding your invoice.   Our billing staff will not be able to assist you with questions regarding bills from these companies.  You will be contacted with the lab results as soon as they are available. The fastest way to get your results is to activate your My Chart account. Instructions are located on the last page of this paperwork. If you have not heard from [place] regarding the results in 2 weeks, please contact this office.       I personally performed the services described in this documentation, which was scribed in my presence. The recorded information has been reviewed and considered, and addended by me as needed.   Signed,   [name], MD [facility].  06/09/16 1:31 PM

## 2016-06-10 LAB — URINALYSIS, MICROSCOPIC ONLY
CASTS: NONE SEEN [LPF]
CRYSTALS: NONE SEEN [HPF]
Squamous Epithelial / LPF: NONE SEEN [HPF] (ref <=5)
WBC, UA: >60 WBC/HPF — AB (ref <=5)
Yeast: NONE SEEN [HPF]

## 2016-06-23 ENCOUNTER — Encounter: Payer: Self-pay | Admitting: Family Medicine

## 2016-06-23 ENCOUNTER — Ambulatory Visit (INDEPENDENT_AMBULATORY_CARE_PROVIDER_SITE_OTHER): Payer: Commercial Managed Care - HMO | Admitting: Family Medicine

## 2016-06-23 VITALS — BP 122/80 | HR 70 | Temp 98.2°F | Resp 18 | Ht 67.5 in | Wt 182.0 lb

## 2016-06-23 DIAGNOSIS — Z13220 Encounter for screening for lipoid disorders: Secondary | ICD-10-CM | POA: Diagnosis not present

## 2016-06-23 DIAGNOSIS — R351 Nocturia: Secondary | ICD-10-CM

## 2016-06-23 DIAGNOSIS — N3001 Acute cystitis with hematuria: Secondary | ICD-10-CM | POA: Diagnosis not present

## 2016-06-23 DIAGNOSIS — N401 Enlarged prostate with lower urinary tract symptoms: Secondary | ICD-10-CM | POA: Diagnosis not present

## 2016-06-23 LAB — POCT URINALYSIS DIP (MANUAL ENTRY)
BILIRUBIN UA: NEGATIVE
BILIRUBIN UA: NEGATIVE
GLUCOSE UA: NEGATIVE
Nitrite, UA: POSITIVE — AB
Protein Ur, POC: 30 — AB
Spec Grav, UA: 1.025
Urobilinogen, UA: 0.2
pH, UA: 5

## 2016-06-23 MED ORDER — CIPROFLOXACIN HCL 500 MG PO TABS: 500.0000 mg | ORAL_TABLET | Freq: Two times a day (BID) | ORAL | 0 refills | Status: DC

## 2016-06-23 NOTE — Patient Instructions (Addendum)
Your urine test still appears to have infection. I'll check another urine culture to make sure, but for now restart the previous antibiotic Cipro twice per day and call your urologist for follow-up to discuss the possible fistula repair.  If you have any abdominal pain, back pain, nausea, vomiting, blood in urine, or any trouble urinating, return here or other medical provider right away.   Urinary Tract Infection Urinary tract infections (UTIs) can develop anywhere along your urinary tract. Your urinary tract is your body's drainage system for removing wastes and extra water. Your urinary tract includes two kidneys, two ureters, a bladder, and a urethra. Your kidneys are a pair of bean-shaped organs. Each kidney is about the size of your fist. They are located below your ribs, one on each side of your spine. CAUSES Infections are caused by microbes, which are microscopic organisms, including fungi, viruses, and bacteria. These organisms are so small that they can only be seen through a microscope. Bacteria are the microbes that most commonly cause UTIs. SYMPTOMS  Symptoms of UTIs may vary by age and gender of the patient and by the location of the infection. Symptoms in young women typically include a frequent and intense urge to urinate and a painful, burning feeling in the bladder or urethra during urination. Older women and men are more likely to be tired, shaky, and weak and have muscle aches and abdominal pain. A fever may mean the infection is in your kidneys. Other symptoms of a kidney infection include pain in your back or sides below the ribs, nausea, and vomiting. DIAGNOSIS To diagnose a UTI, your caregiver will ask you about your symptoms. Your caregiver will also ask you to provide a urine sample. The urine sample will be tested for bacteria and white blood cells. White blood cells are made by your body to help fight infection. TREATMENT  Typically, UTIs can be treated with medication.  Because most UTIs are caused by a bacterial infection, they usually can be treated with the use of antibiotics. The choice of antibiotic and length of treatment depend on your symptoms and the type of bacteria causing your infection. HOME CARE INSTRUCTIONS  If you were prescribed antibiotics, take them exactly as your caregiver instructs you. Finish the medication even if you feel better after you have only taken some of the medication.  Drink enough water and fluids to keep your urine clear or pale yellow.  Avoid caffeine, tea, and carbonated beverages. They tend to irritate your bladder.  Empty your bladder often. Avoid holding urine for long periods of time.  Empty your bladder before and after sexual intercourse.  After a bowel movement, women should cleanse from front to back. Use each tissue only once. SEEK MEDICAL CARE IF:   You have back pain.  You develop a fever.  Your symptoms do not begin to resolve within 3 days. SEEK IMMEDIATE MEDICAL CARE IF:   You have severe back pain or lower abdominal pain.  You develop chills.  You have nausea or vomiting.  You have continued burning or discomfort with urination. MAKE SURE YOU:   Understand these instructions.  Will watch your condition.  Will get help right away if you are not doing well or get worse.   This information is not intended to replace advice given to you by your health care provider. Make sure you discuss any questions you have with your health care provider.   Document Released: 05/11/2005 Document Revised: 04/22/2015 Document Reviewed: 09/09/2011 Elsevier  Interactive Patient Education [employer].     IF you received an x-ray today, you will receive an invoice from [facility]. Please contact [facility]iology at [phone] with questions or concerns regarding your invoice.   IF you received labwork today, you will receive an invoice from [employer].  Please contact [facility] at [phone] with questions or concerns regarding your invoice.   Our billing staff will not be able to assist you with questions regarding bills from these companies.  You will be contacted with the lab results as soon as they are available. The fastest way to get your results is to activate your My Chart account. Instructions are located on the last page of this paperwork. If you have not heard from [place] regarding the results in 2 weeks, please contact this office.

## 2016-06-23 NOTE — Progress Notes (Signed)
By signing my name below, I, Mesha Guinyard, attest that this documentation has been prepared under the direction and in the presence of [name], MD.  Electronically Signed: Verlee Monte, Medical Scribe. 06/23/16. 2:27 PM.  Subjective:    Patient ID: Phillip Maynard., male    DOB: Feb 11, 1941, 75 y.o.   MRN: [id number]  HPI Chief Complaint  Patient presents with  . Follow-up    URINE FREQUENCY    HPI Comments: Phillip Maynard. is a 75 y.o. male who presents to the [facility] for urinary frequency and UTI follow-up. Hx of colovesical fistula and BPH, he was treated with cipro BID for 10 days 05/23/16, nl PSA and urine culture was positive for Klebsiella. His abx had been delayed to approx 10/22, he was on day 4 of 10 on abx, 10/26, however, no worsening sxs so continued on cipro. Urin analysis did indicated infection last visit. He is here for follow-up after completion of abx.   Pt is not fasting.  Reports he was back to nl 2 days after completing his abx. Pt has had nocturia for 2-3x a night for years. Pt had a tooth pulled after he finished his abx from for the UTI and was given another set of abx. Pt is now walking for exercise, and has cut back on candy and doughnuts. Denies urinary frequency, abdominal pain, back pain, and nausea.  Patient Active Problem List   Diagnosis Date Noted  . Severe sepsis(995.92) 04/05/2013  . Pyelonephritis 04/05/2013  . Hypertension 02/28/2013  . Type 2 diabetes mellitus ([place]) 02/28/2013  . Hepatitis C virus infection 02/14/2011  . Urinary (tract) obstruction 02/14/2011  . TOBACCO ABUSE 12/10/2007  . 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 Asthma    "when I was a boy" (04/08/2013)  . 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 Blood transfusion 1970's  . Bronchitis   . GERD (gastroesophageal reflux disease)   . Hepatitis C   . High cholesterol   . Hypertension   . Type II diabetes mellitus  ([place])    Past Surgical History:  Procedure Laterality Date  . INCISION AND DRAINAGE OF WOUND Right 1970's   "leg" (04/08/2013)  . LIVER BIOPSY  ~ 2011  . TONSILLECTOMY     Allergies  Allergen Reactions  . Penicillins Anaphylaxis  . Shellfish Allergy Anaphylaxis   Prior to Admission medications   Medication Sig Start Date End Date Taking? Authorizing Provider  aspirin EC 81 MG tablet Take 81 mg by mouth daily.   Yes Historical Provider, MD  losartan-hydrochlorothiazide (HYZAAR) 100-12.5 MG tablet Take 0.5 tablets by mouth daily. 05/23/16  Yes Wendie Agreste, MD  tamsulosin (FLOMAX) 0.4 MG CAPS capsule Take 0.4 mg by mouth daily. Reported on 09/23/2015   Yes Historical Provider, MD   Social History   Social History  . Marital status: Married    Spouse name: N/A  . Number of children: N/A  . 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 Smoking status: Current Every Day Smoker    Packs/day: 1.00    Years: 45.00    Types: Cigarettes  . Smokeless tobacco: Never Used  . Alcohol use Yes     Comment: 04/08/2013 "haven't drank nothing in > 3 yr; before then I'd have at least 1 pint/day"  . Drug use: No  . Sexual activity:  Yes   Other Topics Concern  . Not on file   Social History Narrative  . No narrative on file   Review of Systems  Gastrointestinal: Negative for abdominal pain and nausea.  Genitourinary: Negative for frequency.  Musculoskeletal: Negative for back pain.    Objective:  Physical Exam  Constitutional: He appears well-developed and well-nourished. No distress.  HENT:  Head: Normocephalic and atraumatic.  Eyes: Conjunctivae are normal.  Neck: Neck supple.  Cardiovascular: Normal rate, regular rhythm and normal heart sounds.  Exam reveals no gallop and no friction rub.   No murmur heard. Pulmonary/Chest: Effort normal and breath sounds normal. No respiratory distress. He has no wheezes. He  has no rales.  Abdominal: Soft. There is no tenderness. There is no CVA tenderness.  Neurological: He is alert.  Skin: Skin is warm and dry.  Psychiatric: He has a normal mood and affect. His behavior is normal.  Nursing note and vitals reviewed.  BP 122/80 (BP Location: Left Arm, Patient Position: Sitting, Cuff Size: Small)   Pulse 70   Temp 98.2 F (36.8 C) (Oral)   Resp 18   Ht 5' 7.5" (1.715 m)   Wt 182 lb (82.6 kg)   SpO2 97%   BMI 28.08 kg/m    Results for orders placed or performed in visit on 06/23/16  POCT urinalysis dipstick  Result Value Ref Range   Color, UA yellow yellow   Clarity, UA cloudy (A) clear   Glucose, UA negative negative   Bilirubin, UA negative negative   Ketones, POC UA negative negative   Spec Grav, UA 1.025    Blood, UA moderate (A) negative   pH, UA 5.0    Protein Ur, POC =30 (A) negative   Urobilinogen, UA 0.2    Nitrite, UA Positive (A) Negative   Leukocytes, UA moderate (2+) (A) Negative   Assessment & Plan:    Phillip Wroble. is a 75 y.o. male Acute cystitis with hematuria - Plan: POCT urinalysis dipstick, Urine Microscopic, ciprofloxacin (CIPRO) 500 MG tablet, Urine culture Benign prostatic hyperplasia with lower urinary tract symptoms, symptom details unspecified Nocturia  - Persistent hematuria, and nitrite and LE on urinalysis, persistent nocturia, but states the symptoms have been present for some time. With reports of colovesical fistula, concern for persistent infection. Restart Cipro 500 mg twice a day for 10 days, side effects discussed, but tolerated this fine last time. instructed patient to call his urologist for follow-up and to discuss treatment for possible fistula, RTC precautions if any worsening symptoms.  Screening for hyperlipidemia - Plan: Lipid panel  -fasting lab visit discussed, lab only order placed     Meds ordered this encounter  Medications  . ciprofloxacin (CIPRO) 500 MG tablet    Sig: Take 1 tablet  (500 mg total) by mouth 2 (two) times daily.    Dispense:  20 tablet    Refill:  0   Patient Instructions    Your urine test still appears to have infection. I'll check another urine culture to make sure, but for now restart the previous antibiotic Cipro twice per day and call your urologist for follow-up to discuss the possible fistula repair.  If you have any abdominal pain, back pain, nausea, vomiting, blood in urine, or any trouble urinating, return here or other medical provider right away.   Urinary Tract Infection Urinary tract infections (UTIs) can develop anywhere along your urinary tract. Your urinary tract is your body's drainage system for removing wastes  and extra water. Your urinary tract includes two kidneys, two ureters, a bladder, and a urethra. Your kidneys are a pair of bean-shaped organs. Each kidney is about the size of your fist. They are located below your ribs, one on each side of your spine. CAUSES Infections are caused by microbes, which are microscopic organisms, including fungi, viruses, and bacteria. These organisms are so small that they can only be seen through a microscope. Bacteria are the microbes that most commonly cause UTIs. SYMPTOMS  Symptoms of UTIs may vary by age and gender of the patient and by the location of the infection. Symptoms in young women typically include a frequent and intense urge to urinate and a painful, burning feeling in the bladder or urethra during urination. Older women and men are more likely to be tired, shaky, and weak and have muscle aches and abdominal pain. A fever may mean the infection is in your kidneys. Other symptoms of a kidney infection include pain in your back or sides below the ribs, nausea, and vomiting. DIAGNOSIS To diagnose a UTI, your caregiver will ask you about your symptoms. Your caregiver will also ask you to provide a urine sample. The urine sample will be tested for bacteria and white blood cells. White blood  cells are made by your body to help fight infection. TREATMENT  Typically, UTIs can be treated with medication. Because most UTIs are caused by a bacterial infection, they usually can be treated with the use of antibiotics. The choice of antibiotic and length of treatment depend on your symptoms and the type of bacteria causing your infection. HOME CARE INSTRUCTIONS  If you were prescribed antibiotics, take them exactly as your caregiver instructs you. Finish the medication even if you feel better after you have only taken some of the medication.  Drink enough water and fluids to keep your urine clear or pale yellow.  Avoid caffeine, tea, and carbonated beverages. They tend to irritate your bladder.  Empty your bladder often. Avoid holding urine for long periods of time.  Empty your bladder before and after sexual intercourse.  After a bowel movement, women should cleanse from front to back. Use each tissue only once. SEEK MEDICAL CARE IF:   You have back pain.  You develop a fever.  Your symptoms do not begin to resolve within 3 days. SEEK IMMEDIATE MEDICAL CARE IF:   You have severe back pain or lower abdominal pain.  You develop chills.  You have nausea or vomiting.  You have continued burning or discomfort with urination. MAKE SURE YOU:   Understand these instructions.  Will watch your condition.  Will get help right away if you are not doing well or get worse.   This information is not intended to replace advice given to you by your health care provider. Make sure you discuss any questions you have with your health care provider.   Document Released: 05/11/2005 Document Revised: 04/22/2015 Document Reviewed: 09/09/2011 Elsevier Interactive Patient Education 2016 [employer].     IF you received an x-ray today, you will receive an invoice from [facility]. Please contact [facility] at [phone] with questions or concerns regarding your  invoice.   IF you received labwork today, you will receive an invoice from [employer]. Please contact [facility] at [phone] with questions or concerns regarding your invoice.   Our billing staff will not be able to assist you with questions regarding bills from these companies.  You will be contacted with  the lab results as soon as they are available. The fastest way to get your results is to activate your My Chart account. Instructions are located on the last page of this paperwork. If you have not heard from [place] regarding the results in 2 weeks, please contact this office.       I personally performed the services described in this documentation, which was scribed in my presence. The recorded information has been reviewed and considered, and addended by me as needed.   Signed,   [name], MD [facility].  06/23/16 2:58 PM

## 2016-06-24 ENCOUNTER — Other Ambulatory Visit (INDEPENDENT_AMBULATORY_CARE_PROVIDER_SITE_OTHER): Payer: Commercial Managed Care - HMO | Admitting: Family Medicine

## 2016-06-24 DIAGNOSIS — Z13220 Encounter for screening for lipoid disorders: Secondary | ICD-10-CM | POA: Diagnosis not present

## 2016-06-24 LAB — URINALYSIS, MICROSCOPIC ONLY
CASTS: NONE SEEN [LPF]
Crystals: NONE SEEN [HPF]
SQUAMOUS EPITHELIAL / LPF: NONE SEEN [HPF] (ref <=5)
WBC, UA: >60 WBC/HPF — AB (ref <=5)
YEAST: NONE SEEN [HPF]

## 2016-06-24 LAB — LIPID PANEL
CHOL/HDL RATIO: 4 ratio (ref <5.0)
CHOLESTEROL: 166 mg/dL (ref <200)
HDL: 42 mg/dL (ref >40)
LDL Cholesterol: 110 mg/dL — ABNORMAL HIGH
Triglycerides: 71 mg/dL (ref <150)
VLDL: 14 mg/dL (ref <30)

## 2016-06-26 LAB — URINE CULTURE

## 2016-07-12 ENCOUNTER — Ambulatory Visit: Payer: Self-pay | Admitting: Surgery

## 2016-07-12 DIAGNOSIS — N321 Vesicointestinal fistula: Secondary | ICD-10-CM

## 2016-07-12 DIAGNOSIS — Z720 Tobacco use: Secondary | ICD-10-CM | POA: Diagnosis not present

## 2016-07-12 DIAGNOSIS — K5732 Diverticulitis of large intestine without perforation or abscess without bleeding: Secondary | ICD-10-CM

## 2016-07-12 DIAGNOSIS — Z01818 Encounter for other preprocedural examination: Secondary | ICD-10-CM | POA: Diagnosis not present

## 2016-07-12 NOTE — H&P (Signed)
Phillip Maynard 07/12/2016 9:51 AM Location: [facility] Patient #: [id number] DOB: 1940-11-08 Married / Language: English / Race: Black or African American Male  History of Present Illness Ardeth Sportsman MD; 07/12/2016 12:28 PM) The patient is a 75 year old male who presents with colovesical fistula. Note for "Colovesical fistula": Patient sent for surgical consultation by his urologist, Dr. Binnie Kand. Concern for colovesical fistula.  Pleasant smoking male. Began to have worsening urinary problems. History of enlarged prostate prostatism. Placed on Flomax as well as finasteride. CT scan done. Revealed gas in the bladder and diverticulitis. Very suspicious for colovesical fistula. Evaluation.  The patient recalls it not being that big of a deal so he just took antibiotics. Eventually he had recurrent urinary tract infections and discuss with his primary care physician who recommended surgery consultation. Patient's had about 4 urinary tract infections. Ciprofloxacin usually takes care of it. He did have a colonoscopy in 2003 that just showed diverticulosis by Dr. Arlyce Dice with [facility] gastroenterology who has since left. Patient returns recalls being told he needed a 10 year follow-up and he is overdue. He usually can walk several miles without difficulty. He still smokes but is cut back from a pack a day to about a third or half a pack a day. He usually moves his bowels a couple times a day. He notes that he still has difficulty starting his stream and gets up with some nocturia but no deterioration since he's been on the medication. No major improvement either. He denies any history of abdominal surgery. He has some elevated blood pressure by 1 medication else take care of it. He is on a low-dose aspirin but no other treatments. He is hepatitis C positive.  No personal nor family history of GI/colon cancer, inflammatory bowel disease, irritable bowel syndrome,  allergy such as Celiac Sprue, dietary/dairy problems, colitis, ulcers nor gastritis. No recent sick contacts/gastroenteritis. No travel outside the country. No changes in diet. No dysphagia to solids or liquids. No significant heartburn or reflux. No hematochezia, hematemesis, coffee ground emesis. No evidence of prior gastric/peptic ulceration.   Other Problems Elease Hashimoto Spillers, CMA; 07/12/2016 9:54 AM) Asthma Enlarged Prostate Gastroesophageal Reflux Disease Hepatitis High blood pressure  Past Surgical History Ethlyn Gallery, CMA; 07/12/2016 9:54 AM) Colon Polyp Removal - Colonoscopy  Diagnostic Studies History Elease Hashimoto Spillers, CMA; 07/12/2016 9:54 AM) Colonoscopy 1-5 years ago  Allergies Elease Hashimoto Spillers, CMA; 07/12/2016 9:55 AM) Penicillin G Pot in Dextrose *PENICILLINS* Shellfish  Medication History Ardeth Sportsman, MD; 07/12/2016 10:19 AM) Pecola Leisure Aspirin (81MG  Tablet Chewable, Oral) Active. Hyzaar (100-12.5MG  Tablet, Oral) Active. Flomax (0.4MG  Capsule, Oral) Active. Medications Reconciled Finasteride (5MG  Tablet, Oral daily) Active. Cipro (500MG  Tablet, Oral two times daily) Active.  Social History Ethlyn Gallery, CMA; 07/12/2016 9:54 AM) Alcohol use Occasional alcohol use. Caffeine use Carbonated beverages, Coffee, Tea. Illicit drug use Remotely quit drug use. Tobacco use Current every day smoker.     Review of Systems Elease Hashimoto Spillers CMA; 07/12/2016 9:54 AM) General Not Present- Appetite Loss, Chills, Fatigue, Fever, Night Sweats, Weight Gain and Weight Loss. Skin Not Present- Change in Wart/Mole, Dryness, Hives, Jaundice, New Lesions, Non-Healing Wounds, Rash and Ulcer. HEENT Present- Wears glasses/contact lenses. Not Present- Earache, Hearing Loss, Hoarseness, Nose Bleed, Oral Ulcers, Ringing in the Ears, Seasonal Allergies, Sinus Pain, Sore Throat, Visual Disturbances and Yellow Eyes. Respiratory Not Present- Bloody sputum, Chronic  Cough, Difficulty Breathing, Snoring and Wheezing. Breast Not Present- Breast Mass, Breast Pain, Nipple Discharge and Skin Changes. Cardiovascular  Not Present- Chest Pain, Difficulty Breathing Lying Down, Leg Cramps, Palpitations, Rapid Heart Rate, Shortness of Breath and Swelling of Extremities. Gastrointestinal Not Present- Abdominal Pain, Bloating, Bloody Stool, Change in Bowel Habits, Chronic diarrhea, Constipation, Difficulty Swallowing, Excessive gas, Gets full quickly at meals, Hemorrhoids, Indigestion, Nausea, Rectal Pain and Vomiting. Male Genitourinary Present- Nocturia, Urgency and Urine Leakage. Not Present- Blood in Urine, Change in Urinary Stream, Frequency, Impotence and Painful Urination. Musculoskeletal Not Present- Back Pain, Joint Pain, Joint Stiffness, Muscle Pain, Muscle Weakness and Swelling of Extremities. Neurological Not Present- Decreased Memory, Fainting, Headaches, Numbness, Seizures, Tingling, Tremor, Trouble walking and Weakness. Psychiatric Not Present- Anxiety, Bipolar, Change in Sleep Pattern, Depression, Fearful and Frequent crying. Endocrine Not Present- Cold Intolerance, Excessive Hunger, Hair Changes, Heat Intolerance, Hot flashes and New Diabetes. Hematology Not Present- Blood Thinners, Easy Bruising, Excessive bleeding, Gland problems, HIV and Persistent Infections.  Vitals (Alisha Spillers CMA; 07/12/2016 9:54 AM) 07/12/2016 9:54 AM Weight: 188 lb Height: 69in Body Surface Area: 2.01 m Body Mass Index: 27.76 kg/m  BP: 130/78 (Sitting, Left Arm, Standard)      Physical Exam Ardeth Sportsman MD; 07/12/2016 10:24 AM)  General Mental Status-Alert. General Appearance-Not in acute distress, Not Sickly. Orientation-Oriented X3. Hydration-Well hydrated. Voice-Normal.  Integumentary Global Assessment Upon inspection and palpation of skin surfaces of the - Axillae: non-tender, no inflammation or ulceration, no drainage. and Distribution  of scalp and body hair is normal. General Characteristics Temperature - normal warmth is noted.  Head and Neck Head-normocephalic, atraumatic with no lesions or palpable masses. Face Global Assessment - atraumatic, no absence of expression. Neck Global Assessment - no abnormal movements, no bruit auscultated on the right, no bruit auscultated on the left, no decreased range of motion, non-tender. Trachea-midline. Thyroid Gland Characteristics - non-tender.  Eye Eyeball - Left-Extraocular movements intact, No Nystagmus. Eyeball - Right-Extraocular movements intact, No Nystagmus. Cornea - Left-No Hazy. Cornea - Right-No Hazy. Sclera/Conjunctiva - Left-No scleral icterus, No Discharge. Sclera/Conjunctiva - Right-No scleral icterus, No Discharge. Pupil - Left-Direct reaction to light normal. Pupil - Right-Direct reaction to light normal. Note: Wears glasses. Vision acceptable  ENMT Ears Pinna - Left - no drainage observed, no generalized tenderness observed. Right - no drainage observed, no generalized tenderness observed. Nose and Sinuses External Inspection of the Nose - no destructive lesion observed. Inspection of the nares - Left - quiet respiration. Right - quiet respiration. Mouth and Throat Lips - Upper Lip - no fissures observed, no pallor noted. Lower Lip - no fissures observed, no pallor noted. Nasopharynx - no discharge present. Oral Cavity/Oropharynx - Tongue - no dryness observed. Oral Mucosa - no cyanosis observed. Hypopharynx - no evidence of airway distress observed.  Chest and Lung Exam Inspection Movements - Normal and Symmetrical. Accessory muscles - No use of accessory muscles in breathing. Palpation Palpation of the chest reveals - Non-tender. Auscultation Breath sounds - Normal and Clear.  Cardiovascular Auscultation Rhythm - Regular. Murmurs & Other Heart Sounds - Auscultation of the heart reveals - No Murmurs and No Systolic  Clicks.  Abdomen Inspection Inspection of the abdomen reveals - No Visible peristalsis and No Abnormal pulsations. Umbilicus - No Bleeding, No Urine drainage. Palpation/Percussion Palpation and Percussion of the abdomen reveal - Soft, Non Tender, No Rebound tenderness, No Rigidity (guarding) and No Cutaneous hyperesthesia. Note: Abdomen soft. No diastases recti Nontender, nondistended. No guarding. No umbilical no other hernias  Male Genitourinary Sexual Maturity Tanner 5 - Adult hair pattern and Adult penile size and shape.  Peripheral Vascular Upper Extremity Inspection - Left - No Cyanotic nailbeds, Not Ischemic. Right - No Cyanotic nailbeds, Not Ischemic.  Neurologic Neurologic evaluation reveals -normal attention span and ability to concentrate, able to name objects and repeat phrases. Appropriate fund of knowledge , normal sensation and normal coordination. Mental Status Affect - not angry, not paranoid. Cranial Nerves-Normal Bilaterally. Gait-Normal.  Neuropsychiatric Mental status exam performed with findings of-able to articulate well with normal speech/language, rate, volume and coherence, thought content normal with ability to perform basic computations and apply abstract reasoning and no evidence of hallucinations, delusions, obsessions or homicidal/suicidal ideation.  Musculoskeletal Global Assessment Spine, Ribs and Pelvis - no instability, subluxation or laxity. Right Upper Extremity - no instability, subluxation or laxity.  Lymphatic Head & Neck  General Head & Neck Lymphatics: Bilateral - Description - No Localized lymphadenopathy. Axillary  General Axillary Region: Bilateral - Description - No Localized lymphadenopathy. Femoral & Inguinal  Generalized Femoral & Inguinal Lymphatics: Left - Description - No Localized lymphadenopathy. Right - Description - No Localized lymphadenopathy.    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 Most likely due to diverticulitis.  He is overdue for a colonoscopy. Last one in 2003 by [facility] gastroenterology showed diverticulosis only. Hopefully that's the most likely etiology, I think he would be a good idea to rule out a cancer or any other intraluminal colorectal problems  I recommended sigmoid colectomy and probable bladder repair from the colovesical fistula. Minimally invasive robotic approach. Ideally would like to get a colonoscopy done the day before. That way he just gets one bowel prep.  He is hoping we can get this done before the end the year. I noted it is unlikely as I'm overbooked, but we'll try and get it done in a timely fashion. We'll send a information the schedulers and see what time can work for the patient and myself.  Quit smoking to minimize surgical complications  Would like to have urology place lighted ureteral stents since it looks like this may be more of a lower fistula near the trigone of the bladder on the CAT scan.  Ciprofloxacin seems to help for his recurrent urinary tract infections. We'll have a subscription available for him to take it as needed PRN recurrent UTI  I called and discussed with his urologist, Dr. Matt Holmes. Dr Matt Holmes recommend the patient stay on the double dose Flomax along with finasteride and follow up with urology a month or 2 after surgery to see what his new normal urinary function iss. He wanted to hold off on doing any TURP or other interventions until the colovescial fistula is repaired and potential chronic cystitis has resolved.  Current Plans Started Cipro 500MG , 1 (one) Tablet two times daily, #14, 7 days starting 07/12/2016, Ref. x3. You are being scheduled for surgery - Our schedulers will call you.  You should hear from our office's scheduling department within 5 working days about the location, date, and time of surgery. We try to  make accommodations for patient's preferences in scheduling surgery, but sometimes the OR schedule or the surgeon's schedule prevents [place] from making those accommodations.  If you have not heard from our office [phone] in 5 working days, call the office and ask for your surgeon's nurse.  If you have other questions about your diagnosis, plan, or surgery, call the office and ask for your surgeon's nurse.  Pt Education - Pamphlet Given -  Diverticulosis and Diverticulitis: discussed with patient and provided information. PREOP COLON - ENCOUNTER FOR PREOPERATIVE EXAMINATION FOR GENERAL SURGICAL PROCEDURE (Z01.818)  Current Plans Written instructions provided Pt Education - [facility] Colon Bowel Prep 2015 Miralax/Antibiotics Started Neomycin Sulfate 500MG , 2 (two) Tablet SEE NOTE, #6, 07/12/2016, No Refill. Local Order: TAKE TWO TABLETS AT 2 PM, 3 PM, AND 10 PM THE DAY PRIOR TO SURGERY Started Flagyl 500MG , 2 (two) Tablet SEE NOTE, #6, 07/12/2016, No Refill. Local Order: Take at 2pm, 3pm, and 10pm the day prior to your colon operation Pt Education - [facility] Colectomy post-op instructions: discussed with patient and provided information. Pt Education - [facility] Good Bowel Health (Jamear Carbonneau) Pt Education - [facility] Pain Control (Diamond Jentz) Pt Education - Pamphlet Given - Laparoscopic Colorectal Surgery: discussed with patient and provided information. TOBACCO ABUSE (Z72.0) Impression: I'm glad that is cut back on his cigarettes to about a third a pack a day. I strongly recommend he quit smoking to minimize his risks of perioperative complications such as leak, permanent colostomy, wound infection, hernia.  Current Plans Pt Education - [facility] STOP SMOKING!  STOP SMOKING! We talked to the patient about the dangers of smoking.  We stressed that tobacco use dramatically increases the risk of peri-operative complications such as infection, tissue necrosis leaving to problems with incision/wound and organ healing, hernia,  chronic pain, heart attack, stroke, DVT, pulmonary embolism, and death.  We noted there are programs in our community to help stop smoking.  Information was available.   Ardeth Sportsman, M.D., F.A.C.S. Gastrointestinal and Minimally Invasive Surgery [facility] [address], [address] [place], [place] [address] [phone] Main / Paging

## 2016-07-15 ENCOUNTER — Telehealth: Payer: Self-pay

## 2016-07-15 NOTE — Telephone Encounter (Signed)
Lars Mage from [place] is calling because they had to refer patient to  [facility] with  Dr. Silverio Decamp. His appointment is Tuesday 12/5 at 8:30 and will need a [facility] referral.   [facility] Fax:[phone] Phone: [phone]

## 2016-07-18 NOTE — Telephone Encounter (Signed)
[employer] auth documented in acuity connect portal. [id number] valid 07/19/2016 - 01/15/2017 for 6 visits.

## 2016-07-19 ENCOUNTER — Encounter: Payer: Self-pay | Admitting: Gastroenterology

## 2016-07-19 ENCOUNTER — Ambulatory Visit (INDEPENDENT_AMBULATORY_CARE_PROVIDER_SITE_OTHER): Payer: Commercial Managed Care - HMO | Admitting: Gastroenterology

## 2016-07-19 VITALS — BP 150/70 | HR 72 | Ht 67.25 in | Wt 181.1 lb

## 2016-07-19 DIAGNOSIS — K5790 Diverticulosis of intestine, part unspecified, without perforation or abscess without bleeding: Secondary | ICD-10-CM | POA: Diagnosis not present

## 2016-07-19 DIAGNOSIS — Z86010 Personal history of colonic polyps: Secondary | ICD-10-CM | POA: Diagnosis not present

## 2016-07-19 DIAGNOSIS — N321 Vesicointestinal fistula: Secondary | ICD-10-CM | POA: Diagnosis not present

## 2016-07-19 MED ORDER — NA SULFATE-K SULFATE-MG SULF 17.5-3.13-1.6 GM/177ML PO SOLN: 1.0000 | Freq: Once | ORAL | 0 refills | Status: AC

## 2016-07-19 NOTE — Progress Notes (Addendum)
Phillip Maynard    [id number]    Apr 20, 1941  Primary Care Physician:GREENE,JEFFREY R, MD  Referring Physician: Darlyne Russian, MD [address] [place], [place] [address]  Chief complaint: Colo vesicle fistula HPI:  7 yr M with h/o chronic Hepatitis C s/p 24 weeks of Interferon, ribavirin and telapevir in 2012 with SVR here to discuss colonoscopy after he was noted to colo vesicle fistula on urologic evaluation. He was bubbles of air while urinating and also has been having frequent urination associated with urgency, frequent UTI's. Denies any episode of acute diverticulitis for which he had to seek medical care, denies abdominal pain, fever or chills. Last colonoscopy in July 2013 with removal of 2 tubular adenoma in transverse and sigmoid colon, also noted left sided diverticulosis. Prior to that colonoscopy in 2010, 2003 and 2000 with removal of adenomas   Outpatient Encounter Prescriptions as of 07/19/2016  Medication Sig  . finasteride (PROSCAR) 5 MG tablet Take 10 tablets by mouth daily.   Marland Kitchen losartan-hydrochlorothiazide (HYZAAR) 100-12.5 MG tablet Take 0.5 tablets by mouth daily.  . tamsulosin (FLOMAX) 0.4 MG CAPS capsule Take 0.4 mg by mouth daily. Reported on 09/23/2015  . Na Sulfate-K Sulfate-Mg Sulf (SUPREP BOWEL PREP KIT) 17.5-3.13-1.6 GM/180ML SOLN Take 1 kit by mouth once.  . [DISCONTINUED] aspirin EC 81 MG tablet Take 81 mg by mouth daily.  . [DISCONTINUED] ciprofloxacin (CIPRO) 500 MG tablet Take 1 tablet (500 mg total) by mouth 2 (two) times daily.   No facility-administered encounter medications on file as of 07/19/2016.     Allergies as of 07/19/2016 - Review Complete 07/19/2016  Allergen Reaction Noted  . Penicillins Anaphylaxis 02/14/2011  . Shellfish allergy Anaphylaxis 02/14/2011    Past Medical History:  Diagnosis Date  . Asthma    "when I was a boy" (04/08/2013)  . 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 Blood transfusion 1970's  . Bronchitis   . Colon  polyps   . Colovesical fistula   . Diverticulosis   . GERD (gastroesophageal reflux disease)   . Hepatitis C   . High cholesterol   . Hypertension   . Type II diabetes mellitus ([place])     Past Surgical History:  Procedure Laterality Date  . INCISION AND DRAINAGE OF WOUND Right 1970's   "leg" (04/08/2013)  . LIVER BIOPSY  ~ 2011  . TONSILLECTOMY      Family History  Problem Relation Age of Onset  . Asthma Mother   . Heart attack Mother     Social History   Social History  . Marital status: Married    Spouse name: N/A  . Number of children: 2  . 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 Smoking status: Current Every Day Smoker    Packs/day: 1.00    Years: 45.00    Types: Cigarettes  . Smokeless tobacco: Never Used  . Alcohol use Yes     Comment: 04/08/2013 "haven't drank nothing in > 3 yr; before then I'd have at least 1 pint/day"  . Drug use: No  . Sexual activity: Yes   Other Topics Concern  . Not on file   Social History Narrative  . No narrative on file      Review of systems: Review of Systems  Constitutional: Negative for fever and chills.  HENT: Negative.  Eyes: Negative for blurred vision.  Respiratory: Negative for cough, shortness of breath and wheezing.   Cardiovascular: Negative for chest pain and palpitations.  Gastrointestinal: as per HPI Genitourinary: Positive for dysuria, urgency, frequency  Musculoskeletal: Negative for myalgias, back pain and joint pain.  Skin: Negative for itching and rash.  Neurological: Negative for dizziness, tremors, focal weakness, seizures and loss of consciousness.  Endo/Heme/Allergies: Positive for seasonal allergies.  Psychiatric/Behavioral: Negative for depression, suicidal ideas and hallucinations.  All other systems reviewed and are negative.   Physical Exam: Vitals:   07/19/16 0836  BP: (!) 150/70  Pulse: 72   Body  mass index is 28.16 kg/m. Gen:      No acute distress HEENT:  EOMI, sclera anicteric Neck:     No masses; no thyromegaly Lungs:    Clear to auscultation bilaterally; normal respiratory effort CV:         Regular rate and rhythm; no murmurs Abd:      + bowel sounds; soft, non-tender; no palpable masses, no distension Ext:    No edema; adequate peripheral perfusion Skin:      Warm and dry; no rash Neuro: alert and oriented x 3 Psych: normal mood and affect  Data Reviewed:  Reviewed labs, radiology imaging, old records and pertinent past GI work up  CT abd & pelvis may 2015 1. Acute diverticulitis of the proximal sigmoid colon without macro perforation or abscess.  Assessment and Plan/Recommendations: 67 yr M with h/o tubular adenomas, left sided diverticulosis and an episode sigmoid diverticulitis based on chart review but patient doesn't recall it here to discuss colonoscopy due to recent diagnosis of colo vesicle fistula Likely etiology perforated sigmoid diverticulitis with subsequent colovesical fistula We'll proceed with colonoscopy to evaluate the area He is also due for surveillance colonoscopy in next 6 months given history of tubular adenomas on his prior colonoscopy in July 2013. The risks and benefits as well as alternatives of endoscopic procedure(s) have been discussed and reviewed. All questions answered. The patient agrees to proceed.    Damaris Hippo , MD [phone] Mon-Fri 8a-5p [phone] after 5p, weekends, holidays  CC: Daub, Loura Back, MD

## 2016-07-19 NOTE — Patient Instructions (Signed)

## 2016-07-19 NOTE — Progress Notes (Signed)
Colonoscopy March 08, 2012 ([facility]) Copied from care everywhere  Mild diverticulosis in the sigmoid colon 2.5 mm polyp in the transverse colon; Therapies performed: A polypectomy was performed using a cold snare, the sample was sent to pathology for analysis. 3.Diminutive two polyps in the sigmoid colon; Therapies performed: This polyp was removed using cold forceps and sent to pathology for analysis. 4.Retroflexed views revealed no abnormalities

## 2016-07-19 NOTE — Progress Notes (Signed)
ACCESSION NUMBER:[id number] RECEIVED: 03/08/2012 ORDERING PHYSICIAN:MICHAEL Leane Platt , MD PATIENT NAME:Phillip Maynard, Phillip Maynard SURGICAL PATHOLOGY REPORT  FINAL PATHOLOGIC DIAGNOSIS MICROSCOPIC EXAMINATION AND DIAGNOSIS  A.TRANSVERSE COLON POLYP, POLYPECTOMY: Tubular adenoma (1 tissue fragment).  B.SIGMOID POLYP, POLYPECTOMY: Tubular adenoma (1 tissue fragment).    I have personally reviewed the slides and/or other related materials referenced, and have edited the report as part of my pathologic assessment and final interpretation.  Electronically Signed Out By: Almira Bar , MD 03/09/2012 16:49:00  joc/ces  Specimen(s) Received A: Transverse colon polyp B: Sigmoid polyp  Clinical History #1 transverse polyp; #2 sigmoid polyps     Gross Description A.Received labeled "transverse colon polyp" is a single fragment of tan-brown soft tissue which is 0.5 x 0.3 x 0.2 cm. The specimen is entirely submitted in A1.  B.Received labeled "sigmoid polyp" is a single tan-brown fragment of soft tissue which is 0.6 x 0.3 x 0.3 cm. The specimen is entirely submitted in [place] ,

## 2016-08-18 ENCOUNTER — Other Ambulatory Visit: Payer: Self-pay | Admitting: Urology

## 2016-08-25 ENCOUNTER — Encounter: Payer: Commercial Managed Care - HMO | Admitting: Gastroenterology

## 2016-09-01 NOTE — Patient Instructions (Signed)
[name]  09/01/2016   Your procedure is scheduled on: 09/08/2016    Report to [facility] Main  Entrance take [place]  elevators to 3rd floor to  [place] at   1015 AM.  Call this number if you have problems the morning of surgery [phone]   Remember: ONLY 1 PERSON MAY GO WITH YOU TO SHORT STAY TO GET  READY MORNING OF [place].  Do not eat food or drink liquids :After Midnight.             Clear liquid diet on 09/07/2016                Follow Bowel Prep Instructions per MD.      Take these medicines the morning of surgery with A SIP OF WATER: flomax, proscar                                You may not have any metal on your body including hair pins and              piercings  Do not wear jewelry, , lotions, powders or perfumes, deodorant             .              Men may shave face and neck.   Do not bring valuables to the hospital. [place].  Contacts, dentures or bridgework may not be worn into surgery.  Leave suitcase in the car. After surgery it may be brought to your room.       Special Instructions: N/A              Please read over the following fact sheets you were given: _____________________________________________________________________                CLEAR LIQUID DIET   Foods Allowed                                                                     Foods Excluded  Coffee and tea, regular and decaf                             liquids that you cannot  Plain Jell-O in any flavor                                             see through such as: Fruit ices (not with fruit pulp)                                     milk, soups, orange juice  Iced Popsicles  All solid food Carbonated beverages, regular and diet                                    Cranberry, grape and apple juices Sports drinks like Gatorade Lightly seasoned  clear broth or consume(fat free) Sugar, honey syrup  Sample Menu Breakfast                                Lunch                                     Supper Cranberry juice                    Beef broth                            Chicken broth Jell-O                                     Grape juice                           Apple juice Coffee or tea                        Jell-O                                      Popsicle                                                Coffee or tea                        Coffee or tea  _____________________________________________________________________  [facility] - Preparing for Surgery Before surgery, you can play an important role.  Because skin is not sterile, your skin needs to be as free of germs as possible.  You can reduce the number of germs on your skin by washing with CHG (chlorahexidine gluconate) soap before surgery.  CHG is an antiseptic cleaner which kills germs and bonds with the skin to continue killing germs even after washing. Please DO NOT use if you have an allergy to CHG or antibacterial soaps.  If your skin becomes reddened/irritated stop using the CHG and inform your nurse when you arrive at Short Stay. Do not shave (including legs and underarms) for at least 48 hours prior to the first CHG shower.  You may shave your face/neck. Please follow these instructions carefully:  1.  Shower with CHG Soap the night before surgery and the  morning of Surgery.  2.  If you choose to wash your hair, wash your hair first as usual with your  normal  shampoo.  3.  After you shampoo, rinse your hair and body thoroughly to remove the  shampoo.  4.  Use CHG as you would any other liquid soap.  You can apply chg directly  to the skin and wash                       Gently with a scrungie or clean washcloth.  5.  Apply the CHG Soap to your body ONLY FROM THE NECK DOWN.   Do not use on face/ open                           Wound or  open sores. Avoid contact with eyes, ears mouth and genitals (private parts).                       Wash face,  Genitals (private parts) with your normal soap.             6.  Wash thoroughly, paying special attention to the area where your surgery  will be performed.  7.  Thoroughly rinse your body with warm water from the neck down.  8.  DO NOT shower/wash with your normal soap after using and rinsing off  the CHG Soap.                9.  Pat yourself dry with a clean towel.            10.  Wear clean pajamas.            11.  Place clean sheets on your bed the night of your first shower and do not  sleep with pets. Day of Surgery : Do not apply any lotions/deodorants the morning of surgery.  Please wear clean clothes to the hospital/surgery center.  FAILURE TO FOLLOW THESE INSTRUCTIONS MAY RESULT IN THE CANCELLATION OF YOUR SURGERY PATIENT SIGNATURE_________________________________  NURSE SIGNATURE__________________________________  ________________________________________________________________________  WHAT IS A BLOOD TRANSFUSION? Blood Transfusion Information  A transfusion is the replacement of blood or some of its parts. Blood is made up of multiple cells which provide different functions.  Red blood cells carry oxygen and are used for blood loss replacement.  White blood cells fight against infection.  Platelets control bleeding.  Plasma helps clot blood.  Other blood products are available for specialized needs, such as hemophilia or other clotting disorders. BEFORE THE TRANSFUSION  Who gives blood for transfusions?   Healthy volunteers who are fully evaluated to make sure their blood is safe. This is blood bank blood. Transfusion therapy is the safest it has ever been in the practice of medicine. Before blood is taken from a donor, a complete history is taken to make sure that person has no history of diseases nor engages in risky social behavior (examples are  intravenous drug use or sexual activity with multiple partners). The donor's travel history is screened to minimize risk of transmitting infections, such as malaria. The donated blood is tested for signs of infectious diseases, such as HIV and hepatitis. The blood is then tested to be sure it is compatible with you in order to minimize the chance of a transfusion reaction. If you or a relative donates blood, this is often done in anticipation of surgery and is not appropriate for emergency situations. It takes many days to process the donated blood. RISKS AND COMPLICATIONS Although transfusion therapy is very safe and saves many lives, the main dangers of transfusion include:   Getting an infectious disease.  Developing a transfusion reaction.  This is an allergic reaction to something in the blood you were given. Every precaution is taken to prevent this. The decision to have a blood transfusion has been considered carefully by your caregiver before blood is given. Blood is not given unless the benefits outweigh the risks. AFTER THE TRANSFUSION  Right after receiving a blood transfusion, you will usually feel much better and more energetic. This is especially true if your red blood cells have gotten low (anemic). The transfusion raises the level of the red blood cells which carry oxygen, and this usually causes an energy increase.  The nurse administering the transfusion will monitor you carefully for complications. HOME CARE INSTRUCTIONS  No special instructions are needed after a transfusion. You may find your energy is better. Speak with your caregiver about any limitations on activity for underlying diseases you may have. SEEK MEDICAL CARE IF:   Your condition is not improving after your transfusion.  You develop redness or irritation at the intravenous (IV) site. SEEK IMMEDIATE MEDICAL CARE IF:  Any of the following symptoms occur over the next 12 hours:  Shaking chills.  You have a  temperature by mouth above 102 F (38.9 C), not controlled by medicine.  Chest, back, or muscle pain.  People around you feel you are not acting correctly or are confused.  Shortness of breath or difficulty breathing.  Dizziness and fainting.  You get a rash or develop hives.  You have a decrease in urine output.  Your urine turns a dark color or changes to pink, red, or brown. Any of the following symptoms occur over the next 10 days:  You have a temperature by mouth above 102 F (38.9 C), not controlled by medicine.  Shortness of breath.  Weakness after normal activity.  The white part of the eye turns yellow (jaundice).  You have a decrease in the amount of urine or are urinating less often.  Your urine turns a dark color or changes to pink, red, or brown. Document Released: 07/29/2000 Document Revised: 10/24/2011 Document Reviewed: 03/17/2008 [facility] Patient Information 2014 [place], [place].  _______________________________________________________________________

## 2016-09-05 ENCOUNTER — Encounter (HOSPITAL_COMMUNITY): Payer: Self-pay

## 2016-09-05 ENCOUNTER — Encounter (HOSPITAL_COMMUNITY)
Admission: RE | Admit: 2016-09-05 | Discharge: 2016-09-05 | Disposition: A | Discharge status: Home / Self Care | Payer: Medicare HMO | Source: Ambulatory Visit | Attending: Surgery | Admitting: Surgery

## 2016-09-05 DIAGNOSIS — Z792 Long term (current) use of antibiotics: Secondary | ICD-10-CM | POA: Diagnosis not present

## 2016-09-05 DIAGNOSIS — N321 Vesicointestinal fistula: Secondary | ICD-10-CM

## 2016-09-05 DIAGNOSIS — R159 Full incontinence of feces: Secondary | ICD-10-CM | POA: Diagnosis not present

## 2016-09-05 DIAGNOSIS — Z01812 Encounter for preprocedural laboratory examination: Secondary | ICD-10-CM

## 2016-09-05 DIAGNOSIS — R9431 Abnormal electrocardiogram [ECG] [EKG]: Secondary | ICD-10-CM | POA: Insufficient documentation

## 2016-09-05 DIAGNOSIS — Z0183 Encounter for blood typing: Secondary | ICD-10-CM

## 2016-09-05 DIAGNOSIS — K632 Fistula of intestine: Secondary | ICD-10-CM | POA: Diagnosis not present

## 2016-09-05 DIAGNOSIS — B182 Chronic viral hepatitis C: Secondary | ICD-10-CM | POA: Diagnosis present

## 2016-09-05 DIAGNOSIS — Z880 Allergy status to penicillin: Secondary | ICD-10-CM | POA: Diagnosis not present

## 2016-09-05 DIAGNOSIS — R001 Bradycardia, unspecified: Secondary | ICD-10-CM | POA: Insufficient documentation

## 2016-09-05 DIAGNOSIS — Z01818 Encounter for other preprocedural examination: Secondary | ICD-10-CM

## 2016-09-05 DIAGNOSIS — R933 Abnormal findings on diagnostic imaging of other parts of digestive tract: Secondary | ICD-10-CM | POA: Diagnosis present

## 2016-09-05 DIAGNOSIS — D128 Benign neoplasm of rectum: Secondary | ICD-10-CM | POA: Diagnosis not present

## 2016-09-05 DIAGNOSIS — D122 Benign neoplasm of ascending colon: Secondary | ICD-10-CM | POA: Diagnosis not present

## 2016-09-05 DIAGNOSIS — K5732 Diverticulitis of large intestine without perforation or abscess without bleeding: Secondary | ICD-10-CM | POA: Diagnosis present

## 2016-09-05 DIAGNOSIS — N138 Other obstructive and reflux uropathy: Secondary | ICD-10-CM | POA: Diagnosis present

## 2016-09-05 DIAGNOSIS — K219 Gastro-esophageal reflux disease without esophagitis: Secondary | ICD-10-CM | POA: Diagnosis present

## 2016-09-05 DIAGNOSIS — F17210 Nicotine dependence, cigarettes, uncomplicated: Secondary | ICD-10-CM | POA: Diagnosis present

## 2016-09-05 DIAGNOSIS — R69 Illness, unspecified: Secondary | ICD-10-CM | POA: Diagnosis not present

## 2016-09-05 DIAGNOSIS — N400 Enlarged prostate without lower urinary tract symptoms: Secondary | ICD-10-CM | POA: Diagnosis not present

## 2016-09-05 DIAGNOSIS — I4510 Unspecified right bundle-branch block: Secondary | ICD-10-CM

## 2016-09-05 DIAGNOSIS — Z7982 Long term (current) use of aspirin: Secondary | ICD-10-CM | POA: Diagnosis not present

## 2016-09-05 DIAGNOSIS — D120 Benign neoplasm of cecum: Secondary | ICD-10-CM | POA: Diagnosis not present

## 2016-09-05 DIAGNOSIS — Z87440 Personal history of urinary (tract) infections: Secondary | ICD-10-CM | POA: Diagnosis not present

## 2016-09-05 DIAGNOSIS — B1920 Unspecified viral hepatitis C without hepatic coma: Secondary | ICD-10-CM | POA: Diagnosis not present

## 2016-09-05 DIAGNOSIS — D126 Benign neoplasm of colon, unspecified: Secondary | ICD-10-CM | POA: Diagnosis not present

## 2016-09-05 DIAGNOSIS — D123 Benign neoplasm of transverse colon: Secondary | ICD-10-CM | POA: Diagnosis not present

## 2016-09-05 DIAGNOSIS — Z91013 Allergy to seafood: Secondary | ICD-10-CM | POA: Diagnosis not present

## 2016-09-05 DIAGNOSIS — E669 Obesity, unspecified: Secondary | ICD-10-CM | POA: Diagnosis not present

## 2016-09-05 DIAGNOSIS — N401 Enlarged prostate with lower urinary tract symptoms: Secondary | ICD-10-CM | POA: Diagnosis present

## 2016-09-05 DIAGNOSIS — N12 Tubulo-interstitial nephritis, not specified as acute or chronic: Secondary | ICD-10-CM | POA: Diagnosis not present

## 2016-09-05 DIAGNOSIS — I10 Essential (primary) hypertension: Secondary | ICD-10-CM | POA: Diagnosis present

## 2016-09-05 DIAGNOSIS — R350 Frequency of micturition: Secondary | ICD-10-CM | POA: Diagnosis present

## 2016-09-05 DIAGNOSIS — K5730 Diverticulosis of large intestine without perforation or abscess without bleeding: Secondary | ICD-10-CM | POA: Diagnosis not present

## 2016-09-05 DIAGNOSIS — K5720 Diverticulitis of large intestine with perforation and abscess without bleeding: Secondary | ICD-10-CM | POA: Diagnosis not present

## 2016-09-05 DIAGNOSIS — K635 Polyp of colon: Secondary | ICD-10-CM | POA: Diagnosis not present

## 2016-09-05 LAB — COMPREHENSIVE METABOLIC PANEL
ALK PHOS: 48 U/L (ref 38–126)
ALT: 18 U/L (ref 17–63)
ANION GAP: 7 (ref 5–15)
AST: 20 U/L (ref 15–41)
Albumin: 4 g/dL (ref 3.5–5.0)
BUN: 14 mg/dL (ref 6–20)
CALCIUM: 8.9 mg/dL (ref 8.9–10.3)
CO2: 27 mmol/L (ref 22–32)
Chloride: 104 mmol/L (ref 101–111)
Creatinine, Ser: 1.12 mg/dL (ref 0.61–1.24)
GFR calc non Af Amer: >60 mL/min (ref >60)
Glucose, Bld: 108 mg/dL — ABNORMAL HIGH (ref 65–99)
Potassium: 3.9 mmol/L (ref 3.5–5.1)
SODIUM: 138 mmol/L (ref 135–145)
TOTAL PROTEIN: 7.8 g/dL (ref 6.5–8.1)
Total Bilirubin: 0.6 mg/dL (ref 0.3–1.2)

## 2016-09-05 LAB — CBC
HCT: 43.1 % (ref 39.0–52.0)
HEMOGLOBIN: 14.7 g/dL (ref 13.0–17.0)
MCH: 29.9 pg (ref 26.0–34.0)
MCHC: 34.1 g/dL (ref 30.0–36.0)
MCV: 87.8 fL (ref 78.0–100.0)
Platelets: 278 10*3/uL (ref 150–400)
RBC: 4.91 MIL/uL (ref 4.22–5.81)
RDW: 14.4 % (ref 11.5–15.5)
WBC: 6.4 10*3/uL (ref 4.0–10.5)

## 2016-09-05 LAB — ABO/RH: ABO/RH(D): A POS

## 2016-09-05 NOTE — Progress Notes (Signed)
Stress test 05/28/15 in epic.

## 2016-09-05 NOTE — Progress Notes (Signed)
Called office of [facility] and spoke with Abigail Butts Juliane Lack at office and she will call Dr Johney [place] regarding patient scheduled for colonoscopy on 09/06/16 with preop at [facility] and surgery on 09/08/16 with bowel prep on 09/07/2016.  Abigail Butts will call patient and further advise him regarding bowel prep for 09/07/16.  Patient has bowel prep instructions for both procedures.

## 2016-09-05 NOTE — Consult Note (Signed)
[place] Nurse ostomy consult note [place] Nurse requested for preoperative stoma site marking by Dr. Clyda Greener.   Discussed surgical procedure and stoma creation with patient.  Explained role of the [facility] nurse team.  Answered patient questions.   Examined patient sitting and standing in order to place the marking in the patient's visual field, away from any creases or abdominal contour issues and within the rectus muscle.  Attempted to mark below the patient's belt line, but he wears his slacks quite low.   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  Marked for ileostomy in the RLQ  7.5 cm to the right of the umbilicus and  3 cm above the umbilicus.  Patient's abdomen cleansed with CHG wipes at site markings, allowed to air dry prior to marking.Covered mark with thin film transparent dressing to preserve mark until date of surgery, Thursday, 09/08/16.   [place] nursing team will remain available to this patient, the nursing, surgical and medical teams should an ostomy be created intraoperatively.  Please re-consult if needed. Thanks, Maudie Flakes, MSN, RN, [place], Arther Abbott  Pager# [phone]

## 2016-09-06 ENCOUNTER — Ambulatory Visit (AMBULATORY_SURGERY_CENTER): Payer: Medicare HMO | Admitting: Gastroenterology

## 2016-09-06 ENCOUNTER — Encounter: Payer: Self-pay | Admitting: Gastroenterology

## 2016-09-06 VITALS — BP 157/72 | HR 58 | Temp 98.9°F | Resp 14 | Ht 67.25 in | Wt 181.0 lb

## 2016-09-06 DIAGNOSIS — D123 Benign neoplasm of transverse colon: Secondary | ICD-10-CM | POA: Diagnosis not present

## 2016-09-06 DIAGNOSIS — Z860100 Personal history of colon polyps, unspecified: Secondary | ICD-10-CM

## 2016-09-06 DIAGNOSIS — R933 Abnormal findings on diagnostic imaging of other parts of digestive tract: Secondary | ICD-10-CM

## 2016-09-06 DIAGNOSIS — D120 Benign neoplasm of cecum: Secondary | ICD-10-CM

## 2016-09-06 DIAGNOSIS — E669 Obesity, unspecified: Secondary | ICD-10-CM | POA: Diagnosis not present

## 2016-09-06 DIAGNOSIS — D128 Benign neoplasm of rectum: Secondary | ICD-10-CM | POA: Diagnosis not present

## 2016-09-06 DIAGNOSIS — D122 Benign neoplasm of ascending colon: Secondary | ICD-10-CM

## 2016-09-06 DIAGNOSIS — B1920 Unspecified viral hepatitis C without hepatic coma: Secondary | ICD-10-CM | POA: Diagnosis not present

## 2016-09-06 DIAGNOSIS — N400 Enlarged prostate without lower urinary tract symptoms: Secondary | ICD-10-CM | POA: Diagnosis not present

## 2016-09-06 DIAGNOSIS — N321 Vesicointestinal fistula: Secondary | ICD-10-CM | POA: Diagnosis not present

## 2016-09-06 DIAGNOSIS — I10 Essential (primary) hypertension: Secondary | ICD-10-CM | POA: Diagnosis not present

## 2016-09-06 DIAGNOSIS — R69 Illness, unspecified: Secondary | ICD-10-CM | POA: Diagnosis not present

## 2016-09-06 LAB — HEMOGLOBIN A1C
HEMOGLOBIN A1C: 6.8 % — AB (ref 4.8–5.6)
Mean Plasma Glucose: 148 mg/dL

## 2016-09-06 MED ORDER — SODIUM CHLORIDE 0.9 % IV SOLN: 500.0000 mL | INTRAVENOUS | Status: DC

## 2016-09-06 NOTE — Progress Notes (Signed)
Final EKG done 09/05/16 - epic

## 2016-09-06 NOTE — Patient Instructions (Addendum)
YOU HAD AN ENDOSCOPIC PROCEDURE TODAY AT [facility]:   Refer to the procedure report that was given to you for any specific questions about what was found during the examination.  If the procedure report does not answer your questions, please call your gastroenterologist to clarify.  If you requested that your care partner not be given the details of your procedure findings, then the procedure report has been included in a sealed envelope for you to review at your convenience later.  YOU SHOULD EXPECT: Some feelings of bloating in the abdomen. Passage of more gas than usual.  Walking can help get rid of the air that was put into your GI tract during the procedure and reduce the bloating. If you had a lower endoscopy (such as a colonoscopy or flexible sigmoidoscopy) you may notice spotting of blood in your stool or on the toilet paper. If you underwent a bowel prep for your procedure, you may not have a normal bowel movement for a few days.  Please Note:  You might notice some irritation and congestion in your nose or some drainage.  This is from the oxygen used during your procedure.  There is no need for concern and it should clear up in a day or so.  SYMPTOMS TO REPORT IMMEDIATELY:   Following lower endoscopy (colonoscopy or flexible sigmoidoscopy):  Excessive amounts of blood in the stool  Significant tenderness or worsening of abdominal pains  Swelling of the abdomen that is new, acute  Fever of 100F or higher   Following upper endoscopy (EGD)  Vomiting of blood or coffee ground material  New chest pain or pain under the shoulder blades  Painful or persistently difficult swallowing  New shortness of breath  Fever of 100F or higher  Black, tarry-looking stools  For urgent or emergent issues, a gastroenterologist can be reached at any hour by calling [phone].   DIET:  We do recommend a clear liquid diet.  Handout was provided.  Drink plenty of fluids but you  should avoid alcoholic beverages for 24 hours.  ACTIVITY:  You should plan to take it easy for the rest of today and you should NOT DRIVE or use heavy machinery until tomorrow (because of the sedation medicines used during the test).    FOLLOW UP: Our staff will call the number listed on your records the next business day following your procedure to check on you and address any questions or concerns that you may have regarding the information given to you following your procedure. If we do not reach you, we will leave a message.  However, if you are feeling well and you are not experiencing any problems, there is no need to return our call.  We will assume that you have returned to your regular daily activities without incident.  If any biopsies were taken you will be contacted by phone or by letter within the next 1-3 weeks.  Please call us at [phone] if you have not heard about the biopsies in 3 weeks.    SIGNATURES/CONFIDENTIALITY: You and/or your care partner have signed paperwork which will be entered into your electronic medical record.  These signatures attest to the fact that that the information above on your After Visit Summary has been reviewed and is understood.  Full responsibility of the confidentiality of this discharge information lies with you and/or your care-partner.    Handouts were given to your care partner on polyps, diverticulosis, hemorrhoids and copy of clear  liquid diet to continue. You may resume your current medications today. Await biopsy results. Please call if any questions or concerns.

## 2016-09-06 NOTE — Progress Notes (Signed)
Called to room to assist during endoscopic procedure.  Patient ID and intended procedure confirmed with present staff. Received instructions for my participation in the procedure from the performing physician.  

## 2016-09-06 NOTE — Progress Notes (Signed)
Report to PACU, RN, vss, BBS= Clear.  

## 2016-09-06 NOTE — Op Note (Signed)
[name] Patient Name: Phillip Maynard Procedure Date: 09/06/2016 2:24 PM MRN: [id number] Endoscopist: Mauri Pole , MD Age: 76 Referring MD:  Date of Birth: June 01, 1941 Gender: Male Account #: [id number] Procedure:                Colonoscopy Indications:              Evaluation on imaging study of clinically                            significant abnormality, Last colonoscopy: 2003 Medicines:                Monitored Anesthesia Care Procedure:                Pre-Anesthesia Assessment:                           - Prior to the procedure, a History and Physical                            was performed, and patient medications and                            allergies were reviewed. The patient's tolerance of                            previous anesthesia was also reviewed. The risks                            and benefits of the procedure and the sedation                            options and risks were discussed with the patient.                            All questions were answered, and informed consent                            was obtained. Prior Anticoagulants: The patient has                            taken no previous anticoagulant or antiplatelet                            agents. ASA Grade Assessment: III - A patient with                            severe systemic disease. After reviewing the risks                            and benefits, the patient was deemed in                            satisfactory condition to undergo the procedure.  After obtaining informed consent, the colonoscope                            was passed under direct vision. Throughout the                            procedure, the patient's blood pressure, pulse, and                            oxygen saturations were monitored continuously. The                            Model PCF-H190L [id number]) scope was introduced                            through the  anus and advanced to the the cecum,                            identified by appendiceal orifice and ileocecal                            valve. The colonoscopy was performed without                            difficulty. The patient tolerated the procedure                            well. The quality of the bowel preparation was                            good. The ileocecal valve, appendiceal orifice, and                            rectum were photographed. Scope In: 2:29:29 PM Scope Out: 2:48:24 PM Scope Withdrawal Time: 0 hours 16 minutes 55 seconds  Total Procedure Duration: 0 hours 18 minutes 55 seconds  Findings:                 The perianal and digital rectal examinations were                            normal.                           Three sessile polyps were found in the transverse                            colon, ascending colon and cecum. The polyps were 1                            to 2 mm in size. These polyps were removed with a                            cold biopsy forceps. Resection and retrieval were  complete.                           Three sessile polyps were found in the rectum and                            ascending colon. The polyps were 4 to 7 mm in size.                            These polyps were removed with a cold snare.                            Resection and retrieval were complete.                           Multiple small and large-mouthed diverticula were                            found in the sigmoid colon and descending colon.                            There was evidence of diverticular spasm.                            Peri-diverticular erythema was seen. There was                            evidence of an impacted diverticulum. Petechia were                            visualized in association with the diverticular                            opening. There was no evidence of diverticular                             bleeding.                           Non-bleeding internal hemorrhoids were found during                            retroflexion. The hemorrhoids were small.                           The exam was otherwise without abnormality. Complications:            No immediate complications. Estimated Blood Loss:     Estimated blood loss was minimal. Impression:               - Three 1 to 2 mm polyps in the transverse colon,                            in the ascending colon and in the cecum, removed  with a cold biopsy forceps. Resected and retrieved.                           - Three 4 to 7 mm polyps in the rectum X1 and in                            the ascending colon X3 , removed with a cold snare.                            Resected and retrieved.                           - Moderate diverticulosis in the sigmoid colon and                            in the descending colon. There was evidence of                            diverticular spasm. Peri-diverticular erythema was                            seen. There was evidence of an impacted                            diverticulum. Petechia were visualized in                            association with the diverticular opening. There                            was no evidence of diverticular bleeding.                           - Non-bleeding internal hemorrhoids.                           - The examination was otherwise normal. Recommendation:           - Patient has a contact number available for                            emergencies. The signs and symptoms of potential                            delayed complications were discussed with the                            patient. Return to normal activities tomorrow.                            Written discharge instructions were provided to the                            patient.                           -  Resume previous diet.                           - Continue present  medications.                           - Await pathology results.                           - Repeat colonoscopy in 3 - 5 years for                            surveillance based on pathology results.                           - Return to GI clinic PRN. Mauri Pole, MD 09/06/2016 2:58:59 PM This report has been signed electronically.

## 2016-09-06 NOTE — Progress Notes (Signed)
No problems noted in the recovery room. maw 

## 2016-09-07 ENCOUNTER — Telehealth: Payer: Self-pay

## 2016-09-07 MED ORDER — SODIUM CHLORIDE 0.9 % IV SOLN
INTRAVENOUS | Status: DC
  Filled 2016-09-07: qty 6

## 2016-09-07 MED ORDER — GENTAMICIN SULFATE 40 MG/ML IJ SOLN
5.0000 mg/kg | INTRAVENOUS | Status: AC
  Administered 2016-09-08: 410 mg via INTRAVENOUS
  Filled 2016-09-07: qty 10.25

## 2016-09-07 MED ORDER — CLINDAMYCIN PHOSPHATE 900 MG/50ML IV SOLN
900.0000 mg | INTRAVENOUS | Status: AC
  Administered 2016-09-08: 900 mg via INTRAVENOUS

## 2016-09-07 NOTE — Telephone Encounter (Signed)
  Follow up Call-  Call back number 09/06/2016  Post procedure Call Back phone  # [phone]  Permission to leave phone message Yes  Some recent data might be hidden     Patient questions:  Do you have a fever, pain , or abdominal swelling? No. Pain Score  0 *  Have you tolerated food without any problems? Yes.    Have you been able to return to your normal activities? Yes.    Do you have any questions about your discharge instructions: Diet   No. Medications  No. Follow up visit  No.  Do you have questions or concerns about your Care? No.  Actions: * If pain score is 4 or above: No action needed, pain <4.

## 2016-09-08 ENCOUNTER — Inpatient Hospital Stay (HOSPITAL_COMMUNITY): Payer: Medicare HMO | Admitting: Anesthesiology

## 2016-09-08 ENCOUNTER — Inpatient Hospital Stay (HOSPITAL_COMMUNITY)
Admission: RE | Admit: 2016-09-08 | Discharge: 2016-09-12 | DRG: 674 | Disposition: A | Discharge status: Home / Self Care | Payer: Medicare HMO | Source: Ambulatory Visit | Attending: Surgery | Admitting: Surgery

## 2016-09-08 ENCOUNTER — Encounter (HOSPITAL_COMMUNITY): Payer: Self-pay | Admitting: *Deleted

## 2016-09-08 ENCOUNTER — Encounter (HOSPITAL_COMMUNITY)
Admission: RE | Disposition: A | Discharge status: Home / Self Care | Payer: Self-pay | Source: Ambulatory Visit | Attending: Surgery

## 2016-09-08 DIAGNOSIS — N139 Obstructive and reflux uropathy, unspecified: Secondary | ICD-10-CM | POA: Diagnosis present

## 2016-09-08 DIAGNOSIS — R159 Full incontinence of feces: Secondary | ICD-10-CM | POA: Diagnosis not present

## 2016-09-08 DIAGNOSIS — R350 Frequency of micturition: Secondary | ICD-10-CM | POA: Diagnosis present

## 2016-09-08 DIAGNOSIS — I10 Essential (primary) hypertension: Secondary | ICD-10-CM | POA: Diagnosis present

## 2016-09-08 DIAGNOSIS — N321 Vesicointestinal fistula: Principal | ICD-10-CM | POA: Diagnosis present

## 2016-09-08 DIAGNOSIS — Z91013 Allergy to seafood: Secondary | ICD-10-CM | POA: Diagnosis not present

## 2016-09-08 DIAGNOSIS — N138 Other obstructive and reflux uropathy: Secondary | ICD-10-CM | POA: Diagnosis present

## 2016-09-08 DIAGNOSIS — K5732 Diverticulitis of large intestine without perforation or abscess without bleeding: Secondary | ICD-10-CM | POA: Diagnosis present

## 2016-09-08 DIAGNOSIS — Z880 Allergy status to penicillin: Secondary | ICD-10-CM | POA: Diagnosis not present

## 2016-09-08 DIAGNOSIS — Z7982 Long term (current) use of aspirin: Secondary | ICD-10-CM

## 2016-09-08 DIAGNOSIS — K219 Gastro-esophageal reflux disease without esophagitis: Secondary | ICD-10-CM | POA: Diagnosis present

## 2016-09-08 DIAGNOSIS — K5720 Diverticulitis of large intestine with perforation and abscess without bleeding: Secondary | ICD-10-CM | POA: Diagnosis not present

## 2016-09-08 DIAGNOSIS — B182 Chronic viral hepatitis C: Secondary | ICD-10-CM | POA: Diagnosis present

## 2016-09-08 DIAGNOSIS — N401 Enlarged prostate with lower urinary tract symptoms: Secondary | ICD-10-CM | POA: Diagnosis present

## 2016-09-08 DIAGNOSIS — F17210 Nicotine dependence, cigarettes, uncomplicated: Secondary | ICD-10-CM | POA: Diagnosis present

## 2016-09-08 DIAGNOSIS — F17200 Nicotine dependence, unspecified, uncomplicated: Secondary | ICD-10-CM | POA: Diagnosis present

## 2016-09-08 DIAGNOSIS — Z87440 Personal history of urinary (tract) infections: Secondary | ICD-10-CM | POA: Diagnosis not present

## 2016-09-08 DIAGNOSIS — Z792 Long term (current) use of antibiotics: Secondary | ICD-10-CM | POA: Diagnosis not present

## 2016-09-08 HISTORY — PX: CYSTOSCOPY: SHX5120

## 2016-09-08 HISTORY — PX: PROCTOSCOPY: SHX2266

## 2016-09-08 LAB — TYPE AND SCREEN
ABO/RH(D): A POS
ANTIBODY SCREEN: NEGATIVE

## 2016-09-08 SURGERY — COLECTOMY, PARTIAL, ROBOT-ASSISTED, LAPAROSCOPIC
Anesthesia: General | Site: Rectum

## 2016-09-08 MED ORDER — LIDOCAINE 2% (20 MG/ML) 5 ML SYRINGE
INTRAMUSCULAR | Status: AC
  Filled 2016-09-08: qty 5

## 2016-09-08 MED ORDER — LIDOCAINE 2% (20 MG/ML) 5 ML SYRINGE
INTRAMUSCULAR | Status: DC | PRN
  Administered 2016-09-08: 100 mg via INTRAVENOUS

## 2016-09-08 MED ORDER — BISACODYL 5 MG PO TBEC
20.0000 mg | DELAYED_RELEASE_TABLET | Freq: Once | ORAL | Status: DC
  Filled 2016-09-08: qty 4

## 2016-09-08 MED ORDER — LACTATED RINGERS IV SOLN
INTRAVENOUS | Status: DC
  Administered 2016-09-08: 22:00:00 via INTRAVENOUS

## 2016-09-08 MED ORDER — ROCURONIUM BROMIDE 50 MG/5ML IV SOSY
PREFILLED_SYRINGE | INTRAVENOUS | Status: AC
  Filled 2016-09-08: qty 5

## 2016-09-08 MED ORDER — TRAMADOL HCL 50 MG PO TABS: 50.0000 mg | ORAL_TABLET | Freq: Four times a day (QID) | ORAL | 0 refills | Status: DC | PRN

## 2016-09-08 MED ORDER — GABAPENTIN 300 MG PO CAPS
300.0000 mg | ORAL_CAPSULE | ORAL | Status: AC
  Administered 2016-09-08: 300 mg via ORAL
  Filled 2016-09-08: qty 1

## 2016-09-08 MED ORDER — ZOLPIDEM TARTRATE 5 MG PO TABS
5.0000 mg | ORAL_TABLET | Freq: Every evening | ORAL | Status: DC | PRN
  Administered 2016-09-10 – 2016-09-11 (×3): 5 mg via ORAL
  Filled 2016-09-08 (×3): qty 1

## 2016-09-08 MED ORDER — LACTATED RINGERS IV BOLUS (SEPSIS): 1000.0000 mL | Freq: Three times a day (TID) | INTRAVENOUS | Status: AC | PRN

## 2016-09-08 MED ORDER — LOSARTAN POTASSIUM 50 MG PO TABS
50.0000 mg | ORAL_TABLET | Freq: Every day | ORAL | Status: DC
  Administered 2016-09-08 – 2016-09-11 (×4): 50 mg via ORAL
  Filled 2016-09-08 (×4): qty 1

## 2016-09-08 MED ORDER — MENTHOL 3 MG MT LOZG: 1.0000 | LOZENGE | OROMUCOSAL | Status: DC | PRN

## 2016-09-08 MED ORDER — STERILE WATER FOR IRRIGATION IR SOLN
Status: DC | PRN
  Administered 2016-09-08: 200 mL via INTRAVESICAL

## 2016-09-08 MED ORDER — METHOCARBAMOL 750 MG PO TABS: 750.0000 mg | ORAL_TABLET | Freq: Four times a day (QID) | ORAL | 2 refills | Status: DC | PRN

## 2016-09-08 MED ORDER — ENALAPRILAT 1.25 MG/ML IV SOLN
0.6250 mg | Freq: Four times a day (QID) | INTRAVENOUS | Status: DC | PRN
  Filled 2016-09-08: qty 1

## 2016-09-08 MED ORDER — PHENYLEPHRINE HCL 10 MG/ML IJ SOLN
INTRAMUSCULAR | Status: DC | PRN
  Administered 2016-09-08: 80 ug via INTRAVENOUS
  Administered 2016-09-08: 40 ug via INTRAVENOUS

## 2016-09-08 MED ORDER — SACCHAROMYCES BOULARDII 250 MG PO CAPS
250.0000 mg | ORAL_CAPSULE | Freq: Two times a day (BID) | ORAL | Status: DC
  Administered 2016-09-08 – 2016-09-11 (×7): 250 mg via ORAL
  Filled 2016-09-08 (×7): qty 1

## 2016-09-08 MED ORDER — HYDRALAZINE HCL 20 MG/ML IJ SOLN: 5.0000 mg | Freq: Four times a day (QID) | INTRAMUSCULAR | Status: DC | PRN

## 2016-09-08 MED ORDER — DEXAMETHASONE SODIUM PHOSPHATE 10 MG/ML IJ SOLN
INTRAMUSCULAR | Status: DC | PRN
  Administered 2016-09-08: 10 mg via INTRAVENOUS

## 2016-09-08 MED ORDER — SUGAMMADEX SODIUM 200 MG/2ML IV SOLN
INTRAVENOUS | Status: DC | PRN
  Administered 2016-09-08: 170 mg via INTRAVENOUS

## 2016-09-08 MED ORDER — MAGIC MOUTHWASH
15.0000 mL | Freq: Four times a day (QID) | ORAL | Status: DC | PRN
  Filled 2016-09-08: qty 15

## 2016-09-08 MED ORDER — ADULT MULTIVITAMIN W/MINERALS CH
1.0000 | ORAL_TABLET | Freq: Every day | ORAL | Status: DC
  Administered 2016-09-08 – 2016-09-11 (×4): 1 via ORAL
  Filled 2016-09-08 (×4): qty 1

## 2016-09-08 MED ORDER — DIPHENHYDRAMINE HCL 12.5 MG/5ML PO ELIX: 12.5000 mg | ORAL_SOLUTION | Freq: Four times a day (QID) | ORAL | Status: DC | PRN

## 2016-09-08 MED ORDER — ONDANSETRON HCL 4 MG/2ML IJ SOLN
INTRAMUSCULAR | Status: AC
  Filled 2016-09-08: qty 2

## 2016-09-08 MED ORDER — ALVIMOPAN 12 MG PO CAPS
12.0000 mg | ORAL_CAPSULE | Freq: Once | ORAL | Status: AC
  Administered 2016-09-08: 12 mg via ORAL
  Filled 2016-09-08: qty 1

## 2016-09-08 MED ORDER — NEOMYCIN SULFATE 500 MG PO TABS
1000.0000 mg | ORAL_TABLET | ORAL | Status: DC
  Filled 2016-09-08: qty 2

## 2016-09-08 MED ORDER — DIPHENHYDRAMINE HCL 50 MG/ML IJ SOLN: 12.5000 mg | Freq: Four times a day (QID) | INTRAMUSCULAR | Status: DC | PRN

## 2016-09-08 MED ORDER — BUPIVACAINE LIPOSOME 1.3 % IJ SUSP
20.0000 mL | INTRAMUSCULAR | Status: DC
  Filled 2016-09-08: qty 20

## 2016-09-08 MED ORDER — BUPIVACAINE HCL (PF) 0.25 % IJ SOLN
INTRAMUSCULAR | Status: AC
  Filled 2016-09-08: qty 30

## 2016-09-08 MED ORDER — ROCURONIUM BROMIDE 10 MG/ML (PF) SYRINGE
PREFILLED_SYRINGE | INTRAVENOUS | Status: DC | PRN
  Administered 2016-09-08: 30 mg via INTRAVENOUS
  Administered 2016-09-08: 10 mg via INTRAVENOUS
  Administered 2016-09-08: 20 mg via INTRAVENOUS
  Administered 2016-09-08: 50 mg via INTRAVENOUS
  Administered 2016-09-08: 10 mg via INTRAVENOUS

## 2016-09-08 MED ORDER — FENTANYL CITRATE (PF) 100 MCG/2ML IJ SOLN
INTRAMUSCULAR | Status: DC | PRN
  Administered 2016-09-08 (×3): 50 ug via INTRAVENOUS
  Administered 2016-09-08: 100 ug via INTRAVENOUS
  Administered 2016-09-08: 50 ug via INTRAVENOUS

## 2016-09-08 MED ORDER — ROCURONIUM BROMIDE 50 MG/5ML IV SOSY
PREFILLED_SYRINGE | INTRAVENOUS | Status: AC
Start: 2016-09-08 — End: 2016-09-08
  Filled 2016-09-08: qty 10

## 2016-09-08 MED ORDER — CLINDAMYCIN PHOSPHATE 900 MG/50ML IV SOLN
900.0000 mg | Freq: Three times a day (TID) | INTRAVENOUS | Status: AC
  Administered 2016-09-08: 900 mg via INTRAVENOUS
  Filled 2016-09-08: qty 50

## 2016-09-08 MED ORDER — CELECOXIB 200 MG PO CAPS
400.0000 mg | ORAL_CAPSULE | ORAL | Status: AC
  Administered 2016-09-08: 400 mg via ORAL
  Filled 2016-09-08: qty 2

## 2016-09-08 MED ORDER — METRONIDAZOLE 500 MG PO TABS
1000.0000 mg | ORAL_TABLET | ORAL | Status: DC
  Filled 2016-09-08: qty 2

## 2016-09-08 MED ORDER — ACETAMINOPHEN 500 MG PO TABS
1000.0000 mg | ORAL_TABLET | ORAL | Status: AC
  Administered 2016-09-08: 1000 mg via ORAL
  Filled 2016-09-08: qty 2

## 2016-09-08 MED ORDER — ENOXAPARIN SODIUM 40 MG/0.4ML ~~LOC~~ SOLN
40.0000 mg | SUBCUTANEOUS | Status: DC
  Administered 2016-09-09 – 2016-09-12 (×4): 40 mg via SUBCUTANEOUS
  Filled 2016-09-08 (×4): qty 0.4

## 2016-09-08 MED ORDER — METOPROLOL TARTRATE 12.5 MG HALF TABLET: 12.5000 mg | ORAL_TABLET | Freq: Two times a day (BID) | ORAL | Status: DC | PRN

## 2016-09-08 MED ORDER — ALUM & MAG HYDROXIDE-SIMETH 200-200-20 MG/5ML PO SUSP
30.0000 mL | Freq: Four times a day (QID) | ORAL | Status: DC | PRN
  Administered 2016-09-09: 30 mL via ORAL
  Filled 2016-09-08 (×2): qty 30

## 2016-09-08 MED ORDER — FENTANYL CITRATE (PF) 250 MCG/5ML IJ SOLN
INTRAMUSCULAR | Status: AC
  Filled 2016-09-08: qty 5

## 2016-09-08 MED ORDER — FENTANYL CITRATE (PF) 100 MCG/2ML IJ SOLN
12.5000 ug | INTRAMUSCULAR | Status: DC | PRN
  Filled 2016-09-08: qty 2

## 2016-09-08 MED ORDER — STERILE WATER FOR INJECTION IJ SOLN
INTRAMUSCULAR | Status: AC
  Filled 2016-09-08: qty 10

## 2016-09-08 MED ORDER — PROPOFOL 10 MG/ML IV BOLUS
INTRAVENOUS | Status: DC | PRN
  Administered 2016-09-08: 150 mg via INTRAVENOUS

## 2016-09-08 MED ORDER — INDOCYANINE GREEN 25 MG IV SOLR
INTRAVENOUS | Status: DC | PRN
  Administered 2016-09-08: 2.5 mg via INTRAVENOUS

## 2016-09-08 MED ORDER — ACETAMINOPHEN 500 MG PO TABS
1000.0000 mg | ORAL_TABLET | Freq: Three times a day (TID) | ORAL | Status: DC
  Administered 2016-09-08 – 2016-09-12 (×11): 1000 mg via ORAL
  Filled 2016-09-08 (×11): qty 2

## 2016-09-08 MED ORDER — HYDROMORPHONE HCL 1 MG/ML IJ SOLN
INTRAMUSCULAR | Status: AC
  Filled 2016-09-08: qty 1

## 2016-09-08 MED ORDER — SODIUM CHLORIDE 0.9 % IV SOLN
INTRAVENOUS | Status: DC | PRN
  Administered 2016-09-08: 1000 mL via INTRAPERITONEAL

## 2016-09-08 MED ORDER — SODIUM CHLORIDE 0.9 % IJ SOLN
INTRAMUSCULAR | Status: AC
  Filled 2016-09-08: qty 50

## 2016-09-08 MED ORDER — CLINDAMYCIN PHOSPHATE 900 MG/50ML IV SOLN
INTRAVENOUS | Status: AC
  Filled 2016-09-08: qty 50

## 2016-09-08 MED ORDER — ENOXAPARIN SODIUM 40 MG/0.4ML ~~LOC~~ SOLN
40.0000 mg | Freq: Once | SUBCUTANEOUS | Status: AC
  Administered 2016-09-08: 40 mg via SUBCUTANEOUS
  Filled 2016-09-08: qty 0.4

## 2016-09-08 MED ORDER — LACTATED RINGERS IV SOLN
INTRAVENOUS | Status: DC
  Administered 2016-09-08 (×3): via INTRAVENOUS

## 2016-09-08 MED ORDER — HYDROMORPHONE HCL 1 MG/ML IJ SOLN
0.2500 mg | INTRAMUSCULAR | Status: DC | PRN
  Administered 2016-09-08 (×2): 0.5 mg via INTRAVENOUS

## 2016-09-08 MED ORDER — METOPROLOL TARTRATE 5 MG/5ML IV SOLN: 5.0000 mg | Freq: Four times a day (QID) | INTRAVENOUS | Status: DC | PRN

## 2016-09-08 MED ORDER — VITAMIN C 500 MG PO TABS
500.0000 mg | ORAL_TABLET | Freq: Two times a day (BID) | ORAL | Status: DC
  Administered 2016-09-08 – 2016-09-11 (×7): 500 mg via ORAL
  Filled 2016-09-08 (×7): qty 1

## 2016-09-08 MED ORDER — SUGAMMADEX SODIUM 200 MG/2ML IV SOLN
INTRAVENOUS | Status: AC
  Filled 2016-09-08: qty 2

## 2016-09-08 MED ORDER — PHENOL 1.4 % MT LIQD: 2.0000 | OROMUCOSAL | Status: DC | PRN

## 2016-09-08 MED ORDER — ONDANSETRON HCL 4 MG/2ML IJ SOLN
INTRAMUSCULAR | Status: DC | PRN
  Administered 2016-09-08: 4 mg via INTRAVENOUS

## 2016-09-08 MED ORDER — LOSARTAN POTASSIUM-HCTZ 100-12.5 MG PO TABS: 0.5000 | ORAL_TABLET | Freq: Every day | ORAL | Status: DC

## 2016-09-08 MED ORDER — FENTANYL CITRATE (PF) 100 MCG/2ML IJ SOLN
INTRAMUSCULAR | Status: AC
  Filled 2016-09-08: qty 2

## 2016-09-08 MED ORDER — METHYLENE BLUE 0.5 % INJ SOLN
INTRAVENOUS | Status: AC
  Filled 2016-09-08: qty 10

## 2016-09-08 MED ORDER — PROCHLORPERAZINE EDISYLATE 5 MG/ML IJ SOLN: 5.0000 mg | INTRAMUSCULAR | Status: DC | PRN

## 2016-09-08 MED ORDER — ALVIMOPAN 12 MG PO CAPS
12.0000 mg | ORAL_CAPSULE | Freq: Two times a day (BID) | ORAL | Status: DC
  Administered 2016-09-09 (×2): 12 mg via ORAL
  Filled 2016-09-08 (×2): qty 1

## 2016-09-08 MED ORDER — FINASTERIDE 5 MG PO TABS
5.0000 mg | ORAL_TABLET | Freq: Every day | ORAL | Status: DC
  Administered 2016-09-09 – 2016-09-11 (×3): 5 mg via ORAL
  Filled 2016-09-08 (×4): qty 1

## 2016-09-08 MED ORDER — METHOCARBAMOL 1000 MG/10ML IJ SOLN
1000.0000 mg | Freq: Four times a day (QID) | INTRAVENOUS | Status: DC | PRN
  Filled 2016-09-08: qty 10

## 2016-09-08 MED ORDER — FENTANYL CITRATE (PF) 100 MCG/2ML IJ SOLN: 25.0000 ug | INTRAMUSCULAR | Status: DC | PRN

## 2016-09-08 MED ORDER — LIP MEDEX EX OINT
1.0000 "application " | TOPICAL_OINTMENT | Freq: Two times a day (BID) | CUTANEOUS | Status: DC
  Administered 2016-09-08 – 2016-09-11 (×7): 1 via TOPICAL
  Filled 2016-09-08: qty 7

## 2016-09-08 MED ORDER — 0.9 % SODIUM CHLORIDE (POUR BTL) OPTIME
TOPICAL | Status: DC | PRN
  Administered 2016-09-08: 1000 mL

## 2016-09-08 MED ORDER — HYDROCHLOROTHIAZIDE 10 MG/ML ORAL SUSPENSION
6.2500 mg | Freq: Every day | ORAL | Status: DC
  Administered 2016-09-08 – 2016-09-11 (×4): 6.25 mg via ORAL
  Filled 2016-09-08 (×6): qty 1.25

## 2016-09-08 MED ORDER — POLYETHYLENE GLYCOL 3350 17 GM/SCOOP PO POWD
1.0000 | Freq: Once | ORAL | Status: DC
  Filled 2016-09-08: qty 255

## 2016-09-08 MED ORDER — PROPOFOL 10 MG/ML IV BOLUS
INTRAVENOUS | Status: AC
  Filled 2016-09-08: qty 20

## 2016-09-08 MED ORDER — TAMSULOSIN HCL 0.4 MG PO CAPS
0.4000 mg | ORAL_CAPSULE | Freq: Every day | ORAL | Status: DC
  Administered 2016-09-09 – 2016-09-11 (×3): 0.4 mg via ORAL
  Filled 2016-09-08 (×3): qty 1

## 2016-09-08 SURGICAL SUPPLY — 117 items
APPLIER CLIP 5 13 M/L LIGAMAX5 (MISCELLANEOUS)
APPLIER CLIP ROT 10 11.4 M/L (STAPLE)
APR CLP MED LRG 11.4X10 (STAPLE)
APR CLP MED LRG 5 ANG JAW (MISCELLANEOUS)
BAG URINE DRAINAGE (UROLOGICAL SUPPLIES) ×2 IMPLANT
BAG URO CATCHER STRL LF (MISCELLANEOUS) ×5 IMPLANT
BLADE EXTENDED COATED 6.5IN (ELECTRODE) IMPLANT
CANNULA REDUC XI 12-8 STAPL (CANNULA) ×1
CANNULA REDUC XI 12-8MM STAPL (CANNULA) ×1
CANNULA REDUCER 12-8 DVNC XI (CANNULA) ×3 IMPLANT
CATH INTERMIT  6FR 70CM (CATHETERS) ×5 IMPLANT
CELLS DAT CNTRL 66122 CELL SVR (MISCELLANEOUS) IMPLANT
CHLORAPREP W/TINT 26ML (MISCELLANEOUS) ×5 IMPLANT
CLIP APPLIE 5 13 M/L LIGAMAX5 (MISCELLANEOUS) IMPLANT
CLIP APPLIE ROT 10 11.4 M/L (STAPLE) IMPLANT
CLIP LIGATING HEM O LOK PURPLE (MISCELLANEOUS) IMPLANT
CLIP LIGATING HEMO O LOK GREEN (MISCELLANEOUS) IMPLANT
CLOTH BEACON ORANGE TIMEOUT ST (SAFETY) ×5 IMPLANT
COUNTER NEEDLE 20 DBL MAG RED (NEEDLE) ×5 IMPLANT
COVER MAYO STAND STRL (DRAPES) ×12 IMPLANT
COVER TIP SHEARS 8 DVNC (MISCELLANEOUS) ×3 IMPLANT
COVER TIP SHEARS 8MM DA VINCI (MISCELLANEOUS) ×2
DECANTER SPIKE VIAL GLASS SM (MISCELLANEOUS) ×5 IMPLANT
DEVICE TROCAR PUNCTURE CLOSURE (ENDOMECHANICALS) ×2 IMPLANT
DRAIN CHANNEL 19F RND (DRAIN) ×2 IMPLANT
DRAPE ARM DVNC X/XI (DISPOSABLE) ×12 IMPLANT
DRAPE COLUMN DVNC XI (DISPOSABLE) ×3 IMPLANT
DRAPE DA VINCI XI ARM (DISPOSABLE) ×10
DRAPE DA VINCI XI COLUMN (DISPOSABLE) ×2
DRAPE SURG IRRIG POUCH 19X23 (DRAPES) ×5 IMPLANT
DRSG OPSITE POSTOP 4X10 (GAUZE/BANDAGES/DRESSINGS) IMPLANT
DRSG OPSITE POSTOP 4X6 (GAUZE/BANDAGES/DRESSINGS) ×2 IMPLANT
DRSG OPSITE POSTOP 4X8 (GAUZE/BANDAGES/DRESSINGS) IMPLANT
DRSG TEGADERM 2-3/8X2-3/4 SM (GAUZE/BANDAGES/DRESSINGS) ×20 IMPLANT
DRSG TEGADERM 4X4.75 (GAUZE/BANDAGES/DRESSINGS) ×2 IMPLANT
ELECT PENCIL ROCKER SW 15FT (MISCELLANEOUS) ×10 IMPLANT
ELECT REM PT RETURN 15FT ADLT (MISCELLANEOUS) ×5 IMPLANT
ENDOLOOP SUT PDS II  0 18 (SUTURE)
ENDOLOOP SUT PDS II 0 18 (SUTURE) IMPLANT
EVACUATOR SILICONE 100CC (DRAIN) ×2 IMPLANT
GAUZE SPONGE 2X2 8PLY STRL LF (GAUZE/BANDAGES/DRESSINGS) ×3 IMPLANT
GAUZE SPONGE 4X4 12PLY STRL (GAUZE/BANDAGES/DRESSINGS) IMPLANT
GLOVE BIOGEL M 8.0 STRL (GLOVE) ×5 IMPLANT
GLOVE ECLIPSE 8.0 STRL XLNG CF (GLOVE) ×15 IMPLANT
GLOVE INDICATOR 8.0 STRL GRN (GLOVE) ×15 IMPLANT
GOWN STRL REUS W/TWL LRG LVL3 (GOWN DISPOSABLE) ×10 IMPLANT
GOWN STRL REUS W/TWL XL LVL3 (GOWN DISPOSABLE) ×25 IMPLANT
GRASPER ENDOPATH ANVIL 10MM (MISCELLANEOUS) IMPLANT
GUIDEWIRE STR DUAL SENSOR (WIRE) ×5 IMPLANT
HOLDER FOLEY CATH W/STRAP (MISCELLANEOUS) ×5 IMPLANT
IRRIG SUCT STRYKERFLOW 2 WTIP (MISCELLANEOUS) ×5
IRRIGATION SUCT STRKRFLW 2 WTP (MISCELLANEOUS) ×3 IMPLANT
KIT PROCEDURE DA VINCI SI (MISCELLANEOUS) ×2
KIT PROCEDURE DVNC SI (MISCELLANEOUS) ×3 IMPLANT
LEGGING LITHOTOMY PAIR STRL (DRAPES) ×5 IMPLANT
LUBRICANT JELLY K Y 4OZ (MISCELLANEOUS) IMPLANT
MANIFOLD NEPTUNE II (INSTRUMENTS) ×5 IMPLANT
NDL INSUFFLATION 14GA 120MM (NEEDLE) ×3 IMPLANT
NEEDLE INSUFFLATION 14GA 120MM (NEEDLE) ×5 IMPLANT
PACK CARDIOVASCULAR III (CUSTOM PROCEDURE TRAY) ×5 IMPLANT
PACK COLON (CUSTOM PROCEDURE TRAY) ×5 IMPLANT
PACK CYSTO (CUSTOM PROCEDURE TRAY) ×5 IMPLANT
PAD POSITIONING PINK XL (MISCELLANEOUS) ×5 IMPLANT
PORT LAP GEL ALEXIS MED 5-9CM (MISCELLANEOUS) ×5 IMPLANT
RELOAD STAPLE 45 BLU REG DVNC (STAPLE) IMPLANT
RELOAD STAPLE 45 GRN THCK DVNC (STAPLE) IMPLANT
RETRACTOR WND ALEXIS 18 MED (MISCELLANEOUS) IMPLANT
RTRCTR WOUND ALEXIS 18CM MED (MISCELLANEOUS)
SCISSORS LAP 5X35 DISP (ENDOMECHANICALS) ×5 IMPLANT
SEAL CANN UNIV 5-8 DVNC XI (MISCELLANEOUS) ×9 IMPLANT
SEAL XI 5MM-8MM UNIVERSAL (MISCELLANEOUS) ×6
SEALER VESSEL DA VINCI XI (MISCELLANEOUS) ×2
SEALER VESSEL EXT DVNC XI (MISCELLANEOUS) ×3 IMPLANT
SET BI-LUMEN FLTR TB AIRSEAL (TUBING) ×5 IMPLANT
SET CYSTO W/LG BORE CLAMP LF (SET/KITS/TRAYS/PACK) ×2 IMPLANT
SLEEVE ADV FIXATION 5X100MM (TROCAR) ×5 IMPLANT
SOLUTION ELECTROLUBE (MISCELLANEOUS) ×5 IMPLANT
SPONGE GAUZE 2X2 STER 10/PKG (GAUZE/BANDAGES/DRESSINGS) ×2
STAPLER 45 BLU RELOAD XI (STAPLE) IMPLANT
STAPLER 45 BLUE RELOAD XI (STAPLE)
STAPLER 45 GREEN RELOAD XI (STAPLE) ×2
STAPLER 45 GRN RELOAD XI (STAPLE) ×3 IMPLANT
STAPLER CANNULA SEAL DVNC XI (STAPLE) ×3 IMPLANT
STAPLER CANNULA SEAL XI (STAPLE) ×2
STAPLER CIRC CVD 29MM 37CM (STAPLE) ×2 IMPLANT
STAPLER SHEATH (SHEATH) ×2
STAPLER SHEATH ENDOWRIST DVNC (SHEATH) ×3 IMPLANT
SUT MNCRL AB 4-0 PS2 18 (SUTURE) ×5 IMPLANT
SUT PDS AB 1 CTX 36 (SUTURE) ×10 IMPLANT
SUT PDS AB 1 TP1 96 (SUTURE) IMPLANT
SUT PDS AB 2-0 CT2 27 (SUTURE) IMPLANT
SUT PROLENE 0 CT 2 (SUTURE) ×5 IMPLANT
SUT PROLENE 2 0 KS (SUTURE) ×2 IMPLANT
SUT PROLENE 2 0 SH DA (SUTURE) IMPLANT
SUT SILK 2 0 (SUTURE) ×5
SUT SILK 2 0 SH CR/8 (SUTURE) ×5 IMPLANT
SUT SILK 2-0 18XBRD TIE 12 (SUTURE) ×3 IMPLANT
SUT SILK 3 0 (SUTURE) ×5
SUT SILK 3 0 SH CR/8 (SUTURE) ×5 IMPLANT
SUT SILK 3-0 18XBRD TIE 12 (SUTURE) ×3 IMPLANT
SUT V-LOC BARB 180 2/0GR6 GS22 (SUTURE)
SUT VIC AB 3-0 SH 18 (SUTURE) IMPLANT
SUT VIC AB 3-0 SH 27 (SUTURE)
SUT VIC AB 3-0 SH 27XBRD (SUTURE) IMPLANT
SUT VICRYL 0 UR6 27IN ABS (SUTURE) ×7 IMPLANT
SUTURE V-LC BRB 180 2/0GR6GS22 (SUTURE) IMPLANT
SYR 10ML LL (SYRINGE) ×5 IMPLANT
SYS LAPSCP GELPORT 120MM (MISCELLANEOUS)
SYSTEM LAPSCP GELPORT 120MM (MISCELLANEOUS) IMPLANT
TAPE UMBILICAL COTTON 1/8X30 (MISCELLANEOUS) ×5 IMPLANT
TOWEL OR 17X26 10 PK STRL BLUE (TOWEL DISPOSABLE) IMPLANT
TOWEL OR NON WOVEN STRL DISP B (DISPOSABLE) ×5 IMPLANT
TRAY FOLEY W/METER SILVER 16FR (SET/KITS/TRAYS/PACK) IMPLANT
TROCAR ADV FIXATION 5X100MM (TROCAR) IMPLANT
TUBING CONNECTING 10 (TUBING) ×4 IMPLANT
TUBING CONNECTING 10' (TUBING) ×1
WATER STERILE IRR 1000ML UROMA (IV SOLUTION) ×2 IMPLANT

## 2016-09-08 NOTE — H&P (Signed)
Phillip Maynard 07/12/2016 9:51 AM Location: [facility] Patient #: [id number] DOB: 04/19/1941 Married / Language: English / Race: Black or African American Male  Patient Care Team: Shade Flood, MD as PCP - General (Family Medicine) Hildred Laser, MD as Consulting Physician (Urology) Karie Soda, MD as Consulting Physician (General Surgery)   History of Present Illness   The patient is a 76 year old male who presents with colovesical fistula. Note for "Colovesical fistula": Patient sent for surgical consultation by his urologist, Dr. Binnie Kand. Concern for colovesical fistula.  Pleasant smoking male. Began to have worsening urinary problems. History of enlarged prostate prostatism. Placed on Flomax as well as finasteride. CT scan done. Revealed gas in the bladder and diverticulitis. Very suspicious for colovesical fistula. Evaluation.  The patient recalls it not being that big of a deal so he just took antibiotics. Eventually he had recurrent urinary tract infections and discuss with his primary care physician who recommended surgery consultation. Patient's had about 4 urinary tract infections. Ciprofloxacin usually takes care of it. He did have a colonoscopy in 2003 that just showed diverticulosis by Dr. Arlyce Dice with [facility] gastroenterology who has since left. Patient returns recalls being told he needed a 10 year follow-up and he is overdue. He usually can walk several miles without difficulty. He still smokes but is cut back from a pack a day to about a third or half a pack a day. He usually moves his bowels a couple times a day. He notes that he still has difficulty starting his stream and gets up with some nocturia but no deterioration since he's been on the medication. No major improvement either. He denies any history of abdominal surgery. He has some elevated blood pressure by 1 medication else take care of it. He is on a low-dose aspirin but  no other treatments. He is hepatitis C positive.  No personal nor family history of GI/colon cancer, inflammatory bowel disease, irritable bowel syndrome, allergy such as Celiac Sprue, dietary/dairy problems, colitis, ulcers nor gastritis. No recent sick contacts/gastroenteritis. No travel outside the country. No changes in diet. No dysphagia to solids or liquids. No significant heartburn or reflux. No hematochezia, hematemesis, coffee ground emesis. No evidence of prior gastric/peptic ulceration.  No new events.  Other Problems Elease Hashimoto Spillers, CMA; 07/12/2016 9:54 AM) Asthma  Enlarged Prostate  Gastroesophageal Reflux Disease  Hepatitis  High blood pressure   Past Surgical History Elease Hashimoto Spillers, CMA; 07/12/2016 9:54 AM) Colon Polyp Removal - Colonoscopy   Diagnostic Studies History Elease Hashimoto Spillers, CMA; 07/12/2016 9:54 AM) Colonoscopy  1-5 years ago  Allergies Elease Hashimoto Spillers, CMA; 07/12/2016 9:55 AM) Penicillin G Pot in Dextrose *PENICILLINS*  Shellfish   Medication History Ardeth Sportsman, MD; 07/12/2016 10:19 AM) Pecola Leisure Aspirin (81MG  Tablet Chewable, Oral) Active. Hyzaar (100-12.5MG  Tablet, Oral) Active. Flomax (0.4MG  Capsule, Oral) Active. Medications Reconciled Finasteride (5MG  Tablet, Oral daily) Active. Cipro (500MG  Tablet, Oral two times daily) Active.  Social History Ethlyn Gallery, CMA; 07/12/2016 9:54 AM) Alcohol use  Occasional alcohol use. Caffeine use  Carbonated beverages, Coffee, Tea. Illicit drug use  Remotely quit drug use. Tobacco use  Current every day smoker.    Review of Systems Elease Hashimoto Spillers CMA; 07/12/2016 9:54 AM) General Not Present- Appetite Loss, Chills, Fatigue, Fever, Night Sweats, Weight Gain and Weight Loss. Skin Not Present- Change in Wart/Mole, Dryness, Hives, Jaundice, New Lesions, Non-Healing Wounds, Rash and Ulcer. HEENT Present- Wears glasses/contact lenses. Not Present- Earache, Hearing Loss,  Hoarseness, Nose Bleed, Oral  Ulcers, Ringing in the Ears, Seasonal Allergies, Sinus Pain, Sore Throat, Visual Disturbances and Yellow Eyes. Respiratory Not Present- Bloody sputum, Chronic Cough, Difficulty Breathing, Snoring and Wheezing. Breast Not Present- Breast Mass, Breast Pain, Nipple Discharge and Skin Changes. Cardiovascular Not Present- Chest Pain, Difficulty Breathing Lying Down, Leg Cramps, Palpitations, Rapid Heart Rate, Shortness of Breath and Swelling of Extremities. Gastrointestinal Not Present- Abdominal Pain, Bloating, Bloody Stool, Change in Bowel Habits, Chronic diarrhea, Constipation, Difficulty Swallowing, Excessive gas, Gets full quickly at meals, Hemorrhoids, Indigestion, Nausea, Rectal Pain and Vomiting. Male Genitourinary Present- Nocturia, Urgency and Urine Leakage. Not Present- Blood in Urine, Change in Urinary Stream, Frequency, Impotence and Painful Urination. Musculoskeletal Not Present- Back Pain, Joint Pain, Joint Stiffness, Muscle Pain, Muscle Weakness and Swelling of Extremities. Neurological Not Present- Decreased Memory, Fainting, Headaches, Numbness, Seizures, Tingling, Tremor, Trouble walking and Weakness. Psychiatric Not Present- Anxiety, Bipolar, Change in Sleep Pattern, Depression, Fearful and Frequent crying. Endocrine Not Present- Cold Intolerance, Excessive Hunger, Hair Changes, Heat Intolerance, Hot flashes and New Diabetes. Hematology Not Present- Blood Thinners, Easy Bruising, Excessive bleeding, Gland problems, HIV and Persistent Infections.  Vitals (Alisha Spillers CMA; 07/12/2016 9:54 AM) 07/12/2016 9:54 AM Weight: 188 lb Height: 69in Body Surface Area: 2.01 m Body Mass Index: 27.76 kg/m  BP: 130/78 (Sitting, Left Arm, Standard)       Physical Exam Ardeth Sportsman MD; 07/12/2016 10:24 AM) General Mental Status-Alert. General Appearance-Not in acute distress, Not Sickly. Orientation-Oriented X3. Hydration-Well  hydrated. Voice-Normal.  Integumentary Global Assessment Upon inspection and palpation of skin surfaces of the - Axillae: non-tender, no inflammation or ulceration, no drainage. and Distribution of scalp and body hair is normal. General Characteristics Temperature - normal warmth is noted.  Head and Neck Head-normocephalic, atraumatic with no lesions or palpable masses. Face Global Assessment - atraumatic, no absence of expression. Neck Global Assessment - no abnormal movements, no bruit auscultated on the right, no bruit auscultated on the left, no decreased range of motion, non-tender. Trachea-midline. Thyroid Gland Characteristics - non-tender.  Eye Eyeball - Left-Extraocular movements intact, No Nystagmus. Eyeball - Right-Extraocular movements intact, No Nystagmus. Cornea - Left-No Hazy. Cornea - Right-No Hazy. Sclera/Conjunctiva - Left-No scleral icterus, No Discharge. Sclera/Conjunctiva - Right-No scleral icterus, No Discharge. Pupil - Left-Direct reaction to light normal. Pupil - Right-Direct reaction to light normal. Note: Wears glasses. Vision acceptable   ENMT Ears Pinna - Left - no drainage observed, no generalized tenderness observed. Right - no drainage observed, no generalized tenderness observed. Nose and Sinuses External Inspection of the Nose - no destructive lesion observed. Inspection of the nares - Left - quiet respiration. Right - quiet respiration. Mouth and Throat Lips - Upper Lip - no fissures observed, no pallor noted. Lower Lip - no fissures observed, no pallor noted. Nasopharynx - no discharge present. Oral Cavity/Oropharynx - Tongue - no dryness observed. Oral Mucosa - no cyanosis observed. Hypopharynx - no evidence of airway distress observed.  Chest and Lung Exam Inspection Movements - Normal and Symmetrical. Accessory muscles - No use of accessory muscles in breathing. Palpation Palpation of the chest reveals -  Non-tender. Auscultation Breath sounds - Normal and Clear.  Cardiovascular Auscultation Rhythm - Regular. Murmurs & Other Heart Sounds - Auscultation of the heart reveals - No Murmurs and No Systolic Clicks.  Abdomen Inspection Inspection of the abdomen reveals - No Visible peristalsis and No Abnormal pulsations. Umbilicus - No Bleeding, No Urine drainage. Palpation/Percussion Palpation and Percussion of the abdomen reveal - Soft, Non Tender,  No Rebound tenderness, No Rigidity (guarding) and No Cutaneous hyperesthesia. Note: Abdomen soft. No diastases recti Nontender, nondistended. No guarding. No umbilical no other hernias   Male Genitourinary Sexual Maturity Tanner 5 - Adult hair pattern and Adult penile size and shape.  Peripheral Vascular Upper Extremity Inspection - Left - No Cyanotic nailbeds, Not Ischemic. Right - No Cyanotic nailbeds, Not Ischemic.  Neurologic Neurologic evaluation reveals -normal attention span and ability to concentrate, able to name objects and repeat phrases. Appropriate fund of knowledge , normal sensation and normal coordination. Mental Status Affect - not angry, not paranoid. Cranial Nerves-Normal Bilaterally. Gait-Normal.  Neuropsychiatric Mental status exam performed with findings of-able to articulate well with normal speech/language, rate, volume and coherence, thought content normal with ability to perform basic computations and apply abstract reasoning and no evidence of hallucinations, delusions, obsessions or homicidal/suicidal ideation.  Musculoskeletal Global Assessment Spine, Ribs and Pelvis - no instability, subluxation or laxity. Right Upper Extremity - no instability, subluxation or laxity.  Lymphatic Head & Neck  General Head & Neck Lymphatics: Bilateral - Description - No Localized lymphadenopathy. Axillary  General Axillary Region: Bilateral - Description - No Localized lymphadenopathy. Femoral &  Inguinal  Generalized Femoral & Inguinal Lymphatics: Left - Description - No Localized lymphadenopathy. Right - Description - No Localized lymphadenopathy.    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 Most likely due to diverticulitis.  He is overdue for a colonoscopy. Last one in 2003 by [facility] gastroenterology showed diverticulosis only. Hopefully that's the most likely etiology, I think he would be a good idea to rule out a cancer or any other intraluminal colorectal problems  I recommended sigmoid colectomy and probable bladder repair from the colovesical fistula. Minimally invasive robotic approach. Ideally would like to get a colonoscopy done the day before. That way he just gets one bowel prep.  He is hoping we can get this done before the end the year. I noted it is unlikely as I'm overbooked, but we'll try and get it done in a timely fashion. We'll send a information the schedulers and see what time can work for the patient and myself.  Quit smoking to minimize surgical complications  Would like to have urology place lighted ureteral stents since it looks like this may be more of a lower fistula near the trigone of the bladder on the CAT scan.  Ciprofloxacin seems to help for his recurrent urinary tract infections. We'll have a subscription available for him to take it as needed PRN recurrent UTI  I called and discussed with his urologist, Dr. Matt Holmes. Dr Matt Holmes recommend the patient stay on the double dose Flomax along with finasteride and follow up with urology a month or 2 after surgery to see what his new normal urinary function iss. He wanted to hold off on doing any TURP or other interventions until the colovescial fistula is repaired and potential chronic cystitis has resolved. Current Plans Started Cipro 500MG , 1 (one) Tablet two times daily, #14, 7 days starting 07/12/2016,  Ref. x3. You are being scheduled for surgery - Our schedulers will call you.  You should hear from our office's scheduling department within 5 working days about the location, date, and time of surgery. We try to make accommodations for patient's preferences in scheduling surgery, but sometimes the OR schedule or the surgeon's schedule prevents [place] from making those accommodations.  If you have not heard from our office (  [phone]) in 5 working days, call the office and ask for your surgeon's nurse.  If you have other questions about your diagnosis, plan, or surgery, call the office and ask for your surgeon's nurse.  Pt Education - Pamphlet Given - Diverticulosis and Diverticulitis: discussed with patient and provided information. PREOP COLON - ENCOUNTER FOR PREOPERATIVE EXAMINATION FOR GENERAL SURGICAL PROCEDURE (Z01.818) Current Plans Written instructions provided Pt Education - [facility] Colon Bowel Prep 2015 Miralax/Antibiotics Started Neomycin Sulfate 500MG , 2 (two) Tablet SEE NOTE, #6, 07/12/2016, No Refill. Local Order: TAKE TWO TABLETS AT 2 PM, 3 PM, AND 10 PM THE DAY PRIOR TO SURGERY Started Flagyl 500MG , 2 (two) Tablet SEE NOTE, #6, 07/12/2016, No Refill. Local Order: Take at 2pm, 3pm, and 10pm the day prior to your colon operation Pt Education - [facility] Colectomy post-op instructions: discussed with patient and provided information. Pt Education - [facility] Good Bowel Health (Arina Torry) Pt Education - [facility] Pain Control (Kimyatta Lecy) Pt Education - Pamphlet Given - Laparoscopic Colorectal Surgery: discussed with patient and provided information. TOBACCO ABUSE (Z72.0) Impression: I'm glad that is cut back on his cigarettes to about a third a pack a day. I strongly recommend he quit smoking to minimize his risks of perioperative complications such as leak, permanent colostomy, wound infection, hernia. Current Plans Pt Education - [facility] STOP SMOKING!  Ardeth Sportsman, M.D., F.A.C.S. Gastrointestinal and  Minimally Invasive Surgery [facility] [address] [place], [place] [address] [phone] Main / Paging

## 2016-09-08 NOTE — Op Note (Signed)
09/08/2016  5:41 PM  PATIENT:  Phillip Maynard.  76 y.o. male  Patient Care Team: Wendie Agreste, MD as PCP - General (Family Medicine) Nickie Retort, MD as Consulting Physician (Urology) Michael Boston, MD as Consulting Physician (General Surgery) Mauri Pole, MD as Consulting Physician (Gastroenterology)  PRE-OPERATIVE DIAGNOSIS:  Colovesical fistula between colon and bladder  POST-OPERATIVE DIAGNOSIS:  Colovesical fistula between colon and bladder  PROCEDURE:   XI ROBOTIC LYSIS OF ADHESIONS LOWER ANTERIOR RECTOSIGMOID RESECTION  COLOVESICAL FISTULA REPAIR RIGID PROCTOSCOPY FIREFLY INJECTIONS  SURGEON:  Adin Hector, MD  ASSISTANT:  Stark Klein, MD Edward Qualia, PA-S, [employer]    ANESTHESIA:   local and general  EBL:  Total I/O In: 2000 [I.V.:2000] Out: 300 [Urine:300]  Delay start of Pharmacological VTE agent (>24hrs) due to surgical blood loss or risk of bleeding:  no  DRAINS:  19Fr Blake drain goes from R abdomen to the left paracolic gutter down into the pelvis around the rectal anastomosis  SPECIMEN:   Rectosigmoid colon with presumed diverticulitis and phlegmon. Open end is proximal. Distal anastomotic ring for final distal margin.    DISPOSITION OF SPECIMEN:  PATHOLOGY  COUNTS:  YES  PLAN OF CARE: Admit to inpatient   PATIENT DISPOSITION:  PACU - hemodynamically stable.  INDICATION:    Elderly patient with colovesical fistula.  No evidence of cancer by colonoscopy or CT scan.  Seem to be diverticulitis since inflammation and diverticular thickening in the distal sigmoid colon   I recommended segmental resection:  The anatomy & physiology of the digestive tract was discussed.  The pathophysiology was discussed.  Natural history risks without surgery was discussed.   I worked to give an overview of the disease and the frequent need to have multispecialty involvement.  I feel the risks of no intervention will lead to serious  problems that outweigh the operative risks; therefore, I recommended a partial colectomy to remove the pathology.  Laparoscopic & open techniques were discussed.   Risks such as bleeding, infection, abscess, leak, reoperation, possible ostomy, hernia, heart attack, death, and other risks were discussed.  I noted a good likelihood this will help address the problem.   Goals of post-operative recovery were discussed as well.  We will work to minimize complications.  Educational materials on the pathology had been given in the office.  Questions were answered.    The patient expressed understanding & wished to proceed with surgery.  OR FINDINGS:   Patient had very thickened sigmoid colon densely adherent to the trigone of the bladder, especially in the left corner.  Ureter on left side pulled up near it but not intimately involved   No obvious metastatic disease on visceral parietal peritoneum or liver.  The anastomosis rests 10 cm from the anal verge by rigid proctoscopy.  DESCRIPTION:   Informed consent was confirmed.  The patient underwent general anaesthesia without difficulty.  The patient was positioned appropriately.  VTE prevention in place.  The patient's abdomen was clipped, prepped, & draped in a sterile fashion.  Surgical timeout confirmed our plan.  The patient was positioned in reverse Trendelenburg.  Abdominal entry was gained using Varess technique with a trach hook on the anterior abdominal wall fascia in the left upper abdomen.  Entry was clean.  I induced carbon dioxide insufflation.  Camera inspection revealed no injury.  Extra ports were carefully placed under direct laparoscopic visualization.  I reflected the greater omentum and the upper abdomen the small  bowel in the upper abdomen.  The patient was carefully positioned.  The Intuitive daVinci robot was carefully docked with camera & instruments carefully placed.  The patient had very inflamed sigmoid colon densely adherent  to the left pelvis and bladder.  Continued in the proximal rectum as well.  I worked to sharply try and mobilize that off in a lateral to medial fashion but encountered very dense concrete adhesions rather quickly.  Side to go mobilize in a less inflamed plane from a medial to lateral aspect.  I scored the base of peritoneum of the medial side of the mesentery of the left colon from the ligament of Treitz to the peritoneal reflection of the mid rectum.   I elevated the sigmoid mesentery and entered into the retro-mesenteric plane. We were able to identify the left ureter that was easy to see under immunofluorescence after the prior fire fly retrograde ureterogram that urology had done.  He was rather stuck to the left colon mesentery consistent with the significant chronic inflammation.  He was able to isolate and peeled down the gonadal vessels as well. We kept those posterior within the retroperitoneum and elevated the left colon mesentery off that. I did isolate the inferior mesenteric artery (IMA) pedicle but did not ligate it yet.  I continued distally and got into the avascular plane posterior to the mesorectum. This allowed me to help mobilize the rectum as well by freeing the mesorectum off the sacrum.  I mobilized the peritoneal coverings towards the peritoneal reflection on both the right and left sides of the rectum.  I stayed away from the right and left ureters.  I kept the lateral vascular pedicles to the rectum intact.  The left colon mesentery was rather inflamed and foreshortened consistent with a chronic diverticular inflammation.  I skeletonized the lymph nodes off the inferior mesenteric artery pedicle.  I went down to its takeoff from the aorta.  I isolated the inferior mesenteric vein off of the ligament of Treitz just cephalad to that as well.  After confirming the left ureter was out of the way, I went ahead and ligated the inferior mesenteric artery pedicle just near its takeoff from the  aorta.  I did ligate the inferior mesenteric vein in a similar fashion.  We ensured hemostasis. I skeletonized the mesorectum at the junction at the proximal rectum for the distal point of resection.  I mobilized the left colon in a lateral to medial fashion off the line of Toldt up towards the splenic flexure to ensure good mobilization of the remaining left colon to reach into the pelvis.  Gerota's fascia and the left kidney asked he wanted to come off the retroperitoneum and stick to the left colon.  Eventually was able to find the plane between the left colon and anterior Gerota's fascia and mobilized the kidney back down into the natural retroperitoneal position  With a little more retrosigmoid mobilization and began trying free the colon off the sidewall.  Just into very sharp dissection.  Occasional focused cautery as well.  Between immunofluorescent view and regular view to make sure I was staying away from the left ureter.  It was rather pulled up towards the most inflamed segment signed appearing on the rectosigmoid mesentery to avoid injuring the ureter.  Gradually we were able to chisel off the retrosigmoid colon off the left pelvis and bladder.  Evidence of a chronic small granulation pocket on the bladder surface but no purulence seen. Able to mobilize the  posterior dome off the presacral space to help straighten it out.  He would mobilization of the peritoneal coverings of the rectum down to the peritoneal reflection to straighten it out.  I had the OR team filled the bladder with methylene blue isotonic solution.  It distended the bladder to over 300 mL.  Excellent distention.  There was no evidence of any leak.  With distention, it became apparent that the most dense area of inflammation that was about the size of a silver dollar was at the base of the left bladder trigone.  No leak.   The proximal rectum was quite inflamed, so signed of having continued dissection down to the mid rectum which  was soft for soft and not involved.  I skeletonized at the proximal/mid mesorectum and transected at the mid rectum using a robotic 45 mm stapler.  I chose a region at the descending/sigmoid junction that was soft and easily reached down to the rectal stump.  I transected the mesentery of the colon radially to preserve remaining colon blood supply.  I had anesthesia inject firefly intravenously.  Saw excellent perfusion to the preselected descending colon transection point.  Also good lighting up of the rectal stump as well.  This confirmed good blood supply to proximal and distal ends for the colorectal EEA anastomosis.  I made a small Pfannenstiel incision suprapubically.  Placed a wound protector.  I was able to eviscerate the rectosigmoid and descending colon out the wound.   I clamped the colon proximal to this area using a soft bowel clamp.  I clamped the transition location with a purse stringer device.  Past 2-0 Prolene suture on a Keith needle  I transected at the descending/sigmoid junction with a scalpel. I got healthy bleeding mucosa.  We sent the rectosigmoid colon specimen off to go to pathology.  We sized the colon orifice.  I chose a 29 EEA anvil stapler system.  I reinforced pursestring with interrupted silk sutures.  I placed the anvil to the open end of the proximal remaining colon and closed around it using a 0 Prolene pursestring.  We did copious irrigation with crystalloid solution.  Hemostasis was good.  The distal end of the remaining colon easily reached down to the rectal stump, therefore, splenic flexure mobilization was not needed.      Dr Barry Dienes scrubbed down and did gentle anal dilation and advanced the EEA stapler up the rectal stump. The spike was brought out at the provimal end of the rectal stump under direct visualization.  I laparoscopically attached the anvil of the proximal colon the spike of the stapler.  Anvil was tightened down and held clamped for 60 seconds. The EEA  stapler was fired and held clamped for 30 seconds. The stapler was released & removed. We noted 2 excellent anastomotic rings. Blue stitch is in the proximal ring.  Dr Barry Dienes did rigid proctoscopy noted the anastomosis was at 10 cm from the anal verge consistent with the proximal rectum.  We did a final irrigation of antibiotic solution (900 mg clindamycin/240 mg gentamicin in a liter of crystalloid) & held that for the pelvic air leak test .  The rectum was insufflated the rectum while clamping the colon proximal to that anastomosis.  There was a negative air leak test. There was no tension of mesentery or bowel at the anastomosis.   Tissues looked viable.  Ureters & bowel uninjured.  The anastomosis looked healthy.  I passed a 0 Vicryl suture at the  12 mm stapler port site to help close the fascia down.  I did pass a 33 [place] Blake drain through that area at the skin.  The rest I laid along the left paracolic gutter and around the anastomosis and the deep pelvis.  Drain secured with 2-0 Prolene suture.  Endoluminal gas was evacuated.  Ports & wound protector removed.  We changed gloves & redraped the patient per colon SSI prevention protocol.  We aspirated the antibiotic irrigation.  Hemostasis was good.  Sterile unused instruments were used from this point.  I closed the 37mm port sites using Monocryl stitch and sterile dressing.  I closed the extraction wound using a #1 PDS vertical peritoneal closure and a #1 PDS transverse anterior rectal fascial closure like a small Pfannenstiel closure. I closed the skin with some interrupted Monocryl stitches. I placed antibiotic-soaked wicks into the closure at the corners x2.  I placed sterile dressings.     Patient is being extubated go to recovery room. I had discussed postop care with the patient in detail the office & in the holding area. Instructions are written. I discussed operative findings, updated the patient's status, discussed probable steps to recovery,  and gave postoperative recommendations to the patient's spouse.  Recommendations were made.  Questions were answered.  She expressed understanding & appreciation.  Adin Hector, M.D., F.A.C.S. Gastrointestinal and Minimally Invasive Surgery [facility] [address] [place], [place] [id number] [phone] Main / Paging

## 2016-09-08 NOTE — Anesthesia Postprocedure Evaluation (Signed)
Anesthesia Post Note  Patient: Lani Beavers.  Procedure(s) Performed: Procedure(s) (LRB): XI ROBOTIC LYSIS OF ADHESIONS LOWER ANTERIOR RESECTION OF SIGMOID COLON WITH COLOVESICAL FISTULA REPAIR (N/A) RIGID PROCTOSCOPY (N/A) FIREFLY INJECTIONS (N/A)  Patient location during evaluation: PACU Anesthesia Type: General Level of consciousness: sedated and patient cooperative Pain management: pain level controlled Vital Signs Assessment: post-procedure vital signs reviewed and stable Respiratory status: spontaneous breathing Cardiovascular status: stable Anesthetic complications: no       Last Vitals:  Vitals:   09/08/16 1845 09/08/16 1900  BP: (!) 165/86 (!) 179/85  Pulse: 78 77  Resp: 10 10  Temp:      Last Pain:  Vitals:   09/08/16 1845  TempSrc:   PainSc: Asleep                 Nolon Nations

## 2016-09-08 NOTE — Interval H&P Note (Signed)
History and Physical Interval Note:  09/08/2016 11:03 AM  Phillip Maynard.  has presented today for surgery, with the diagnosis of colovesical fistula between colon and bladder  The various methods of treatment have been discussed with the patient and family. After consideration of risks, benefits and other options for treatment, the patient has consented to  Procedure(s): XI ROBOTIC RESECTION OF SIGMOID COLON WITH COLOVESICAL FISTULA REPAIR POSSIBLE OSTOMY (N/A) RIGID PROCTOSCOPY (N/A) FIREFLY INJECTIONS (N/A) as a surgical intervention .  The patient's history has been reviewed, patient examined, no change in status, stable for surgery.  I have reviewed the patient's chart and labs.  Questions were answered to the patient's satisfaction.     [name] C.

## 2016-09-08 NOTE — Anesthesia Procedure Notes (Signed)
Procedure Name: Intubation Date/Time: 09/08/2016 12:51 PM Performed by: Glory Buff Pre-anesthesia Checklist: Patient identified, Emergency Drugs available, Suction available and Patient being monitored Patient Re-evaluated:Patient Re-evaluated prior to inductionOxygen Delivery Method: Circle system utilized Preoxygenation: Pre-oxygenation with 100% oxygen Intubation Type: IV induction Ventilation: Mask ventilation without difficulty Laryngoscope Size: Miller and 3 Grade View: Grade I Tube type: Oral Tube size: 7.5 mm Number of attempts: 1 Airway Equipment and Method: Stylet and Oral airway Placement Confirmation: ETT inserted through vocal cords under direct vision,  positive ETCO2 and breath sounds checked- equal and bilateral Secured at: 21 cm Tube secured with: Tape Dental Injury: Teeth and Oropharynx as per pre-operative assessment

## 2016-09-08 NOTE — Discharge Instructions (Signed)
SURGERY: POST OP INSTRUCTIONS °(Surgery for small bowel obstruction, colon resection, etc) ° ° °###################################################################### ° °EAT °Gradually transition to a high fiber diet with a fiber supplement over the next few days after discharge ° °WALK °Walk an hour a day.  Control your pain to do that.   ° °CONTROL PAIN °Control pain so that you can walk, sleep, tolerate sneezing/coughing, go up/down stairs. ° °HAVE A BOWEL MOVEMENT DAILY °Keep your bowels regular to avoid problems.  OK to try a laxative to override constipation.  OK to use an antidairrheal to slow down diarrhea.  Call if not better after 2 tries ° °CALL IF YOU HAVE PROBLEMS/CONCERNS °Call if you are still struggling despite following these instructions. °Call if you have concerns not answered by these instructions ° °###################################################################### ° ° °DIET °Follow a light diet the first few days at home.  Start with a bland diet such as soups, liquids, starchy foods, low fat foods, etc.  If you feel full, bloated, or constipated, stay on a ful liquid or pureed/blenderized diet for a few days until you feel better and no longer constipated. °Be sure to drink plenty of fluids every day to avoid getting dehydrated (feeling dizzy, not urinating, etc.). °Gradually add a fiber supplement to your diet over the next week.  Gradually get back to a regular solid diet.  Avoid fast food or heavy meals the first week as you are more likely to get nauseated. °It is expected for your digestive tract to need a few months to get back to normal.  It is common for your bowel movements and stools to be irregular.  You will have occasional bloating and cramping that should eventually fade away.  Until you are eating solid food normally, off all pain medications, and back to regular activities; your bowels will not be normal. °Focus on eating a low-fat, high fiber diet the rest of your life  (See Getting to Good Bowel Health, below). ° °CARE of your INCISION or WOUND °It is good for closed incision and even open wounds to be washed every day.  Shower every day.  Short baths are fine.  Wash the incisions and wounds clean with soap & water.    °If you have a closed incision(s), wash the incision with soap & water every day.  You may leave closed incisions open to air if it is dry.   You may cover the incision with clean gauze & replace it after your daily shower for comfort. °If you have skin tapes (Steristrips) or skin glue (Dermabond) on your incision, leave them in place.  They will fall off on their own like a scab.  You may trim any edges that curl up with clean scissors.  If you have staples, set up an appointment for them to be removed in the office in 10 days after surgery.  °If you have a drain, wash around the skin exit site with soap & water and place a new dressing of gauze or band aid around the skin every day.  Keep the drain site clean & dry.    °If you have an open wound with packing, see wound care instructions.  In general, it is encouraged that you remove your dressing and packing, shower with soap & water, and replace your dressing once a day.  Pack the wound with clean gauze moistened with normal (0.9%) saline to keep the wound moist & uninfected.  Pressure on the dressing for 30 minutes will stop most wound   bleeding.  Eventually your body will heal & pull the open wound closed over the next few months.  °Raw open wounds will occasionally bleed or secrete yellow drainage until it heals closed.  Drain sites will drain a little until the drain is removed.  Even closed incisions can have mild bleeding or drainage the first few days until the skin edges scab over & seal.   °If you have an open wound with a wound vac, see wound vac care instructions. ° ° ° ° °ACTIVITIES as tolerated °Start light daily activities --- self-care, walking, climbing stairs-- beginning the day after surgery.   Gradually increase activities as tolerated.  Control your pain to be active.  Stop when you are tired.  Ideally, walk several times a day, eventually an hour a day.   °Most people are back to most day-to-day activities in a few weeks.  It takes 4-8 weeks to get back to unrestricted, intense activity. °If you can walk 30 minutes without difficulty, it is safe to try more intense activity such as jogging, treadmill, bicycling, low-impact aerobics, swimming, etc. °Save the most intensive and strenuous activity for last (Usually 4-8 weeks after surgery) such as sit-ups, heavy lifting, contact sports, etc.  Refrain from any intense heavy lifting or straining until you are off narcotics for pain control.  You will have off days, but things should improve week-by-week. °DO NOT PUSH THROUGH PAIN.  Let pain be your guide: If it hurts to do something, don't do it.  Pain is your body warning you to avoid that activity for another week until the pain goes down. °You may drive when you are no longer taking narcotic prescription pain medication, you can comfortably wear a seatbelt, and you can safely make sudden turns/stops to protect yourself without hesitating due to pain. °You may have sexual intercourse when it is comfortable. If it hurts to do something, stop. ° °MEDICATIONS °Take your usually prescribed home medications unless otherwise directed.   °Blood thinners:  °Usually you can restart any strong blood thinners after the second postoperative day.  It is OK to take aspirin right away.    ° If you are on strong blood thinners (warfarin/Coumadin, Plavix, Xerelto, Eliquis, Pradaxa, etc), discuss with your surgeon, medicine PCP, and/or cardiologist for instructions on when to restart the blood thinner & if blood monitoring is needed (PT/INR blood check, etc).   ° ° °PAIN CONTROL °Pain after surgery or related to activity is often due to strain/injury to muscle, tendon, nerves and/or incisions.  This pain is usually  short-term and will improve in a few months.  °To help speed the process of healing and to get back to regular activity more quickly, DO THE FOLLOWING THINGS TOGETHER: °1. Increase activity gradually.  DO NOT PUSH THROUGH PAIN °2. Use Ice and/or Heat °3. Try Gentle Massage and/or Stretching °4. Take over the counter pain medication °5. Take Narcotic prescription pain medication for more severe pain ° °Good pain control = faster recovery.  It is better to take more medicine to be more active than to stay in bed all day to avoid medications. °1.  Increase activity gradually °Avoid heavy lifting at first, then increase to lifting as tolerated over the next 6 weeks. °Do not “push through” the pain.  Listen to your body and avoid positions and maneuvers than reproduce the pain.  Wait a few days before trying something more intense °Walking an hour a day is encouraged to help your body recover faster   and more safely.  Start slowly and stop when getting sore.  If you can walk 30 minutes without stopping or pain, you can try more intense activity (running, jogging, aerobics, cycling, swimming, treadmill, sex, sports, weightlifting, etc.) °Remember: If it hurts to do it, then don’t do it! °2. Use Ice and/or Heat °You will have swelling and bruising around the incisions.  This will take several weeks to resolve. °Ice packs or heating pads (6-8 times a day, 30-60 minutes at a time) will help sooth soreness & bruising. °Some people prefer to use ice alone, heat alone, or alternate between ice & heat.  Experiment and see what works best for you.  Consider trying ice for the first few days to help decrease swelling and bruising; then, switch to heat to help relax sore spots and speed recovery. °Shower every day.  Short baths are fine.  It feels good!  Keep the incisions and wounds clean with soap & water.   °3. Try Gentle Massage and/or Stretching °Massage at the area of pain many times a day °Stop if you feel pain - do not  overdo it °4. Take over the counter pain medication °This helps the muscle and nerve tissues become less irritable and calm down faster °Choose ONE of the following over-the-counter anti-inflammatory medications: °Acetaminophen 500mg tabs (Tylenol) 1-2 pills with every meal and just before bedtime (avoid if you have liver problems or if you have acetaminophen in you narcotic prescription) °Naproxen 220mg tabs (ex. Aleve, Naprosyn) 1-2 pills twice a day (avoid if you have kidney, stomach, IBD, or bleeding problems) °Ibuprofen 200mg tabs (ex. Advil, Motrin) 3-4 pills with every meal and just before bedtime (avoid if you have kidney, stomach, IBD, or bleeding problems) °Take with food/snack several times a day as directed for at least 2 weeks to help keep pain / soreness down & more manageable. °5. Take Narcotic prescription pain medication for more severe pain °A prescription for strong pain control is often given to you upon discharge (for example: oxycodone/Percocet, hydrocodone/Norco/Vicodin, or tramadol/Ultram) °Take your pain medication as prescribed. °Be mindful that most narcotic prescriptions contain Tylenol (acetaminophen) as well - avoid taking too much Tylenol. °If you are having problems/concerns with the prescription medicine (does not control pain, nausea, vomiting, rash, itching, etc.), please call us [phone] to see if we need to switch you to a different pain medicine that will work better for you and/or control your side effects better. °If you need a refill on your pain medication, you must call the office before 4 pm and on weekdays only.  By federal law, prescriptions for narcotics cannot be called into a pharmacy.  They must be filled out on paper & picked up from our office by the patient or authorized caretaker.  Prescriptions cannot be filled after 4 pm nor on weekends.   ° °WHEN TO CALL US [phone] °Severe uncontrolled or worsening pain  °Fever over 101 F (38.5 C) °Concerns with  the incision: Worsening pain, redness, rash/hives, swelling, bleeding, or drainage °Reactions / problems with new medications (itching, rash, hives, nausea, etc.) °Nausea and/or vomiting °Difficulty urinating °Difficulty breathing °Worsening fatigue, dizziness, lightheadedness, blurred vision °Other concerns °If you are not getting better after two weeks or are noticing you are getting worse, contact our office [phone] for further advice.  We may need to adjust your medications, re-evaluate you in the office, send you to the emergency room, or see what other things we can do to help. °The   clinic staff is available to answer your questions during regular business hours (8:30am-5pm).  Please don’t hesitate to call and ask to speak to one of our nurses for clinical concerns.    °A surgeon from [facility] is always on call at the hospitals 24 hours/day °If you have a medical emergency, go to the nearest emergency room or call 911. ° °FOLLOW UP in our office °One the day of your discharge from the hospital (or the next business weekday), please call [facility] to set up or confirm an appointment to see your surgeon in the office for a follow-up appointment.  Usually it is 2-3 weeks after your surgery.   °If you have skin staples at your incision(s), let the office know so we can set up a time in the office for the nurse to remove them (usually around 10 days after surgery). °Make sure that you call for appointments the day of discharge (or the next business weekday) from the hospital to ensure a convenient appointment time. °IF YOU HAVE DISABILITY OR FAMILY LEAVE FORMS, BRING THEM TO THE OFFICE FOR PROCESSING.  DO NOT GIVE THEM TO YOUR DOCTOR. ° °[facility] °[address], [address], , [place]  [address] ? °[phone] - Main °[phone] - Toll Free,  [phone] - Fax °[email or web address] ° °GETTING TO GOOD BOWEL HEALTH. °It is  expected for your digestive tract to need a few months to get back to normal.  It is common for your bowel movements and stools to be irregular.  You will have occasional bloating and cramping that should eventually fade away.  Until you are eating solid food normally, off all pain medications, and back to regular activities; your bowels will not be normal.   °Avoiding constipation °The goal: ONE SOFT BOWEL MOVEMENT A DAY!    °Drink plenty of fluids.  Choose water first. °TAKE A FIBER SUPPLEMENT EVERY DAY THE REST OF YOUR LIFE °During your first week back home, gradually add back a fiber supplement every day °Experiment which form you can tolerate.   There are many forms such as powders, tablets, wafers, gummies, etc °Psyllium bran (Metamucil), methylcellulose (Citrucel), Miralax or Glycolax, Benefiber, Flax Seed.  °Adjust the dose week-by-week (1/2 dose/day to 6 doses a day) until you are moving your bowels 1-2 times a day.  Cut back the dose or try a different fiber product if it is giving you problems such as diarrhea or bloating. °Sometimes a laxative is needed to help jump-start bowels if constipated until the fiber supplement can help regulate your bowels.  If you are tolerating eating & you are farting, it is okay to try a gentle laxative such as double dose MiraLax, prune juice, or Milk of Magnesia.  Avoid using laxatives too often. °Stool softeners can sometimes help counteract the constipating effects of narcotic pain medicines.  It can also cause diarrhea, so avoid using for too long. °If you are still constipated despite taking fiber daily, eating solids, and a few doses of laxatives, call our office. °Controlling diarrhea °Try drinking liquids and eating bland foods for a few days to avoid stressing your intestines further. °Avoid dairy products (especially milk & ice cream) for a short time.  The intestines often can lose the ability to digest lactose when stressed. °Avoid foods that cause gassiness or  bloating.  Typical foods include beans and other legumes, cabbage, broccoli, and dairy foods.  Avoid greasy, spicy, fast foods.  Every person has   some sensitivity to other foods, so listen to your body and avoid those foods that trigger problems for you. °Probiotics (such as active yogurt, Align, etc) may help repopulate the intestines and colon with normal bacteria and calm down a sensitive digestive tract °Adding a fiber supplement gradually can help thicken stools by absorbing excess fluid and retrain the intestines to act more normally.  Slowly increase the dose over a few weeks.  Too much fiber too soon can backfire and cause cramping & bloating. °It is okay to try and slow down diarrhea with a few doses of antidiarrheal medicines.   °Bismuth subsalicylate (ex. Kayopectate, Pepto Bismol) for a few doses can help control diarrhea.  Avoid if pregnant.   °Loperamide (Imodium) can slow down diarrhea.  Start with one tablet (2mg) first.  Avoid if you are having fevers or severe pain.  °ILEOSTOMY PATIENTS WILL HAVE CHRONIC DIARRHEA since their colon is not in use.    °Drink plenty of liquids.  You will need to drink even more glasses of water/liquid a day to avoid getting dehydrated. °Record output from your ileostomy.  Expect to empty the bag every 3-4 hours at first.  Most people with a permanent ileostomy empty their bag 4-6 times at the least.   °Use antidiarrheal medicine (especially Imodium) several times a day to avoid getting dehydrated.  Start with a dose at bedtime & breakfast.  Adjust up or down as needed.  Increase antidiarrheal medications as directed to avoid emptying the bag more than 8 times a day (every 3 hours). °Work with your wound ostomy nurse to learn care for your ostomy.  See ostomy care instructions. °TROUBLESHOOTING IRREGULAR BOWELS °1) Start with a soft & bland diet. No spicy, greasy, or fried foods.  °2) Avoid gluten/wheat or dairy products from diet to see if symptoms improve. °3) Miralax  17gm or flax seed mixed in 8oz. water or juice-daily. May use 2-4 times a day as needed. °4) Gas-X, Phazyme, etc. as needed for gas & bloating.  °5) Prilosec (omeprazole) over-the-counter as needed °6)  Consider probiotics (Align, Activa, etc) to help calm the bowels down ° °Call your doctor if you are getting worse or not getting better.  Sometimes further testing (cultures, endoscopy, X-ray studies, CT scans, bloodwork, etc.) may be needed to help diagnose and treat the cause of the diarrhea. °[facility] °[address], [address], [place], [place]  [address] °[phone] - Main.    °[phone]  - Toll Free.   [phone] - Fax °[email or web address] ° ° °

## 2016-09-08 NOTE — Anesthesia Preprocedure Evaluation (Addendum)
Anesthesia Evaluation  Patient identified by MRN, date of birth, ID band Patient awake    Reviewed: Allergy & Precautions, H&P , Patient's Chart, lab work & pertinent test results, reviewed documented beta blocker date and time   Airway Mallampati: II  TM Distance: >3 FB Neck ROM: full    Dental no notable dental hx. (+) Poor Dentition, Missing   Pulmonary Current Smoker,    Pulmonary exam normal breath sounds clear to auscultation       Cardiovascular hypertension, On Medications  Rhythm:regular Rate:Normal     Neuro/Psych    GI/Hepatic   Endo/Other  diabetes, Type 2  Renal/GU      Musculoskeletal   Abdominal   Peds  Hematology   Anesthesia Other Findings Bronchitis    Hypertension   High cholesterol    GERD    Asthma.......as a boy"   Type II diabetes mellitus  Hepatitis C      Reproductive/Obstetrics                            Anesthesia Physical Anesthesia Plan  ASA: III  Anesthesia Plan: General   Post-op Pain Management:    Induction: Intravenous  Airway Management Planned: Oral ETT  Additional Equipment:   Intra-op Plan:   Post-operative Plan: Extubation in OR  Informed Consent: I have reviewed the patients History and Physical, chart, labs and discussed the procedure including the risks, benefits and alternatives for the proposed anesthesia with the patient or authorized representative who has indicated his/her understanding and acceptance.   Dental Advisory Given and Dental advisory given  Plan Discussed with: CRNA and Surgeon  Anesthesia Plan Comments: (  Discussed general anesthesia, including possible nausea, instrumentation of airway, sore throat,pulmonary aspiration, etc. I asked if the were any outstanding questions, or  concerns before we proceeded.)        Anesthesia Quick Evaluation

## 2016-09-08 NOTE — Transfer of Care (Signed)
Immediate Anesthesia Transfer of Care Note  Patient: Phillip Maynard.  Procedure(s) Performed: Procedure(s): XI ROBOTIC LYSIS OF ADHESIONS LOWER ANTERIOR RESECTION OF SIGMOID COLON WITH COLOVESICAL FISTULA REPAIR (N/A) RIGID PROCTOSCOPY (N/A) FIREFLY INJECTIONS (N/A)  Patient Location: PACU  Anesthesia Type:General  Level of Consciousness:  sedated, patient cooperative and responds to stimulation  Airway & Oxygen Therapy:Patient Spontanous Breathing and Patient connected to face mask oxgen  Post-op Assessment:  Report given to PACU RN and Post -op Vital signs reviewed and stable  Post vital signs:  Reviewed and stable  Last Vitals:  Vitals:   09/08/16 1007  BP: (!) 154/87  Pulse: 85  Resp: 16  Temp: [id number] C    Complications: No apparent anesthesia complications

## 2016-09-09 ENCOUNTER — Encounter (HOSPITAL_COMMUNITY): Payer: Self-pay | Admitting: Surgery

## 2016-09-09 LAB — CBC
HCT: 39.5 % (ref 39.0–52.0)
HEMOGLOBIN: 13.6 g/dL (ref 13.0–17.0)
MCH: 30 pg (ref 26.0–34.0)
MCHC: 34.4 g/dL (ref 30.0–36.0)
MCV: 87.2 fL (ref 78.0–100.0)
PLATELETS: 270 10*3/uL (ref 150–400)
RBC: 4.53 MIL/uL (ref 4.22–5.81)
RDW: 14.1 % (ref 11.5–15.5)
WBC: 14.6 10*3/uL — AB (ref 4.0–10.5)

## 2016-09-09 LAB — BASIC METABOLIC PANEL
ANION GAP: 6 (ref 5–15)
BUN: 16 mg/dL (ref 6–20)
CALCIUM: 8.3 mg/dL — AB (ref 8.9–10.3)
CHLORIDE: 102 mmol/L (ref 101–111)
CO2: 26 mmol/L (ref 22–32)
CREATININE: 1.17 mg/dL (ref 0.61–1.24)
GFR calc non Af Amer: >60 mL/min (ref >60)
Glucose, Bld: 119 mg/dL — ABNORMAL HIGH (ref 65–99)
Potassium: 4.2 mmol/L (ref 3.5–5.1)
SODIUM: 134 mmol/L — AB (ref 135–145)

## 2016-09-09 LAB — MAGNESIUM: MAGNESIUM: 1.8 mg/dL (ref 1.7–2.4)

## 2016-09-09 MED ORDER — METHOCARBAMOL 500 MG PO TABS
1000.0000 mg | ORAL_TABLET | Freq: Four times a day (QID) | ORAL | Status: DC
  Administered 2016-09-09: 1000 mg via ORAL
  Filled 2016-09-09: qty 2

## 2016-09-09 MED ORDER — TRAMADOL HCL 50 MG PO TABS
50.0000 mg | ORAL_TABLET | Freq: Four times a day (QID) | ORAL | Status: DC | PRN
  Administered 2016-09-10 – 2016-09-11 (×3): 50 mg via ORAL
  Filled 2016-09-09 (×3): qty 1

## 2016-09-09 MED ORDER — HYDROMORPHONE HCL 2 MG/ML IJ SOLN
0.5000 mg | INTRAMUSCULAR | Status: DC | PRN
  Administered 2016-09-09: 0.5 mg via INTRAVENOUS
  Filled 2016-09-09: qty 1

## 2016-09-09 MED ORDER — SODIUM CHLORIDE 0.9% FLUSH
3.0000 mL | Freq: Two times a day (BID) | INTRAVENOUS | Status: DC
  Administered 2016-09-09 – 2016-09-11 (×5): 3 mL via INTRAVENOUS

## 2016-09-09 MED ORDER — SODIUM CHLORIDE 0.9 % IV SOLN: 250.0000 mL | INTRAVENOUS | Status: DC | PRN

## 2016-09-09 MED ORDER — METHOCARBAMOL 500 MG PO TABS
1000.0000 mg | ORAL_TABLET | Freq: Three times a day (TID) | ORAL | Status: DC
  Administered 2016-09-09 – 2016-09-11 (×8): 1000 mg via ORAL
  Filled 2016-09-09 (×8): qty 2

## 2016-09-09 MED ORDER — SODIUM CHLORIDE 0.9% FLUSH: 3.0000 mL | INTRAVENOUS | Status: DC | PRN

## 2016-09-09 NOTE — Progress Notes (Signed)
Pt has tolerated clear liquids through the night with no extra abdominal pain and no complaints of nausea. Pt upgraded to full liquids per MD standing orders.  Phillip Maynard

## 2016-09-09 NOTE — Progress Notes (Signed)
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  Stop IV fluids.  Improve pain control.  Continue scheduled ice and Tylenol.  And scheduled Robaxin.  I again reminded him to use narcotics as needed for breakthrough pain.  He asked as fourth though.  Blood pressure control A2I.  Diuretic.  VTE prophylaxis- SCDs, etc  Mobilize as tolerated to help recovery  Adin Hector, M.D., F.A.C.S. Gastrointestinal and Minimally Invasive Surgery [facility] [address], [place], [place] [address] [phone] Main / Paging   09/09/2016  CARE TEAM:  PCP: Wendie Agreste, MD  Outpatient Care Team: Patient Care Team: Wendie Agreste, MD as PCP - General (Family Medicine) Nickie Retort, MD as Consulting Physician (Urology) Michael Boston, MD as Consulting Physician (General Surgery) Mauri Pole, MD as Consulting Physician (Gastroenterology)  Inpatient Treatment Team: Treatment Team: Attending Provider: Michael Boston, MD; Technician: Raylene Everts, NT  Subjective:  Sore.  Trying to use Tylenol.   Ambulated in hallways twice. Starting  full liquid diet. Nurses and wife and room.  Objective:  Vital signs:  Vitals:   09/08/16 2103 09/08/16 2300 09/09/16 0204 09/09/16 0512  BP: (!) 151/81 125/68 (!) 155/74 140/67  Pulse: 67 64 62 62  Resp: '14 16 16 18  '$ Temp: 98.5 F (36.9 C) 98.3 F (36.8 C) 98.2 F (36.8 C) 98.5 F (36.9 C)  TempSrc: Oral Oral Oral Oral  SpO2: 99% 100% 100% 100%  Weight:      Height:        Last BM Date: 09/08/16  Intake/Output   Yesterday:  01/25 0701 - 01/26 0700 In: 2634.4 [I.V.:2524.2; IV Piggyback:110.3] Out: 1550 [Urine:975; Drains:575] This shift:  No intake/output data recorded.  Bowel function:  Flatus: YES  BM:  YES  Drain: Serosanguinous   Physical Exam:  General: Pt awake/alert/oriented x4 in No acute distress Eyes: PERRL, normal EOM.  Sclera clear.  No icterus Neuro: CN II-XII intact w/o focal sensory/motor deficits. Lymph: No head/neck/groin lymphadenopathy Psych:  No delerium/psychosis/paranoia HENT: Normocephalic, Mucus membranes moist.  No thrush Neck: Supple, No tracheal deviation Chest: No chest wall pain w good excursion CV:  Pulses intact.  Regular rhythm MS: Normal AROM mjr joints.  No obvious deformity Abdomen: Soft.  Mildy distended.  Mildly tender at incisions only.  No evidence of peritonitis.  No incarcerated hernias. Ext:  SCDs BLE.  No mjr edema.  No cyanosis Skin: No petechiae / purpura  Results:   Labs: Results for orders placed or performed during the hospital encounter of 09/08/16 (from the past 48 hour(s))  Basic metabolic panel     Status: Abnormal   Collection Time: 09/09/16  5:05 AM  Result Value Ref  Range   Sodium 134 (L) 135 - 145 mmol/L   Potassium 4.2 3.5 - 5.1 mmol/L   Chloride 102 101 - 111 mmol/L   CO2 26 22 - 32 mmol/L   Glucose, Bld 119 (H) 65 - 99 mg/dL   BUN 16 6 - 20 mg/dL   Creatinine, Ser 1.17 0.61 - 1.24 mg/dL   Calcium 8.3 (L) 8.9 - 10.3 mg/dL   GFR calc non Af Amer >60 >60 mL/min   GFR calc Af Amer >60 >60  mL/min    Comment: (NOTE) The eGFR has been calculated using the CKD EPI equation. This calculation has not been validated in all clinical situations. eGFR's persistently <60 mL/min signify possible Chronic Kidney Disease.    Anion gap 6 5 - 15  CBC     Status: Abnormal   Collection Time: 09/09/16  5:05 AM  Result Value Ref Range   WBC 14.6 (H) 4.0 - 10.5 K/uL   RBC 4.53 4.22 - 5.81 MIL/uL   Hemoglobin 13.6 13.0 - 17.0 g/dL   HCT 39.5 39.0 - 52.0 %   MCV 87.2 78.0 - 100.0 fL   MCH 30.0 26.0 - 34.0 pg   MCHC 34.4 30.0 - 36.0 g/dL   RDW 14.1 11.5 - 15.5 %   Platelets 270 150 - 400 K/uL  Magnesium     Status: None   Collection Time: 09/09/16  5:05 AM  Result Value Ref Range   Magnesium 1.8 1.7 - 2.4 mg/dL    Imaging / Studies: No results found.  Medications / Allergies: per chart  Antibiotics: Anti-infectives    Start     Dose/Rate Route Frequency Ordered Stop   09/08/16 2200  clindamycin (CLEOCIN) IVPB 900 mg     900 mg 100 mL/hr over 30 Minutes Intravenous Every 8 hours 09/08/16 1955 09/08/16 2200   09/08/16 1734  clindamycin (CLEOCIN) 900 mg, gentamicin (GARAMYCIN) 240 mg in sodium chloride 0.9 % 1,000 mL for intraperitoneal lavage  Status:  Discontinued       As needed 09/08/16 1734 09/08/16 1754   09/08/16 1053  neomycin (MYCIFRADIN) tablet 1,000 mg  Status:  Discontinued     1,000 mg Oral 3 times per day 09/08/16 1053 09/08/16 1955   09/08/16 1053  metroNIDAZOLE (FLAGYL) tablet 1,000 mg  Status:  Discontinued     1,000 mg Oral 3 times per day 09/08/16 1053 09/08/16 1955   09/07/16 1145  clindamycin (CLEOCIN) 900 mg, gentamicin (GARAMYCIN) 240 mg in sodium chloride 0.9 % 1,000 mL for intraperitoneal lavage  Status:  Discontinued      Intraperitoneal To Surgery 09/07/16 1132 09/08/16 1955   09/07/16 1132  clindamycin (CLEOCIN) IVPB 900 mg     900 mg 100 mL/hr over 30 Minutes Intravenous 60 min pre-op 09/07/16 1132 09/08/16 1343   09/07/16 1132  gentamicin (GARAMYCIN)  410 mg in dextrose 5 % 100 mL IVPB     5 mg/kg  82.1 kg 110.3 mL/hr over 60 Minutes Intravenous 60 min pre-op 09/07/16 1132 09/08/16 1323        Note: Portions of this report may have been transcribed using voice recognition software. Every effort was made to ensure accuracy; however, inadvertent computerized transcription errors may be present.   Any transcriptional errors that result from this process are unintentional.     Adin Hector, M.D., F.A.C.S. Gastrointestinal and Minimally Invasive Surgery [facility] [address] [place], [place] [address] [phone] Main / Paging  09/09/2016   

## 2016-09-10 NOTE — Progress Notes (Signed)
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 Active Problems:   TOBACCO ABUSE   Chronic hepatitis C ([place])     Plan:  Start Kegel exercises.  Re-evaluate tomorrow.  Not ready for discharge yet.   LOS: 2 days     2 Days Post-Op  Subjective: Having problems with fecal incontinence.  Has a BM every time he urinates. Very sore when moving.  Objective: Vital signs in last 24 hours: Temp:  [98.6 F (37 C)-98.8 F (37.1 C)] 98.8 F (37.1 C) (01/27 0540) Pulse Rate:  [60-77] 60 (01/27 0540) Resp:  [18] 18 (01/27 0540) BP: (129-140)/(57-77) 136/57 (01/27 0540) SpO2:  [97 %-100 %] 99 % (01/27 0540) Weight:  [80.9 kg (178 lb 5.6 oz)-82.8 kg (182 lb 8 oz)] 80.9 kg (178 lb 5.6 oz) (01/27 0540) Last BM Date: 09/10/16  Intake/Output from previous day: 01/26 0701 - 01/27 0700 In: 480 [P.O.:480] Out: 2351 [Urine:2250; Drains:100; Stool:1] Intake/Output this shift: No intake/output data recorded.  PE: General- In NAD Abdomen-soft, lower transverse wound is clean, serosanguinous drain output  Lab Results:   Recent Labs  09/09/16 0505  WBC 14.6*  HGB 13.6  HCT 39.5  PLT 270   BMET  Recent Labs  09/09/16 0505  NA 134*  K 4.2  CL 102  CO2 26  GLUCOSE 119*  BUN 16  CREATININE 1.17  CALCIUM 8.3*   PT/INR No results for input(s): LABPROT, INR in the last 72 hours. Comprehensive Metabolic Panel:    Component Value Date/Time   NA 134 (L) 09/09/2016 0505   NA 138 09/05/2016 0942   K 4.2 09/09/2016 0505   K 3.9 09/05/2016 0942   CL 102 09/09/2016 0505   CL 104 09/05/2016 0942   CO2 26 09/09/2016 0505   CO2 27 09/05/2016 0942   BUN 16 09/09/2016 0505   BUN 14 09/05/2016 0942   CREATININE 1.17 09/09/2016 0505   CREATININE 1.12 09/05/2016 0942   CREATININE 1.03 05/23/2016 1307   CREATININE 0.98 09/23/2015 1514   GLUCOSE 119 (H) 09/09/2016 0505   GLUCOSE 108 (H) 09/05/2016 0942   CALCIUM 8.3 (L) 09/09/2016 0505   CALCIUM 8.9 09/05/2016 0942   AST 20 09/05/2016 0942   AST 17 05/23/2016 1307   ALT 18 09/05/2016 0942   ALT 14 05/23/2016 1307   ALKPHOS 48 09/05/2016 0942   ALKPHOS 47 05/23/2016 1307   BILITOT 0.6 09/05/2016 0942   BILITOT 1.1 05/23/2016 1307   PROT 7.8 09/05/2016 0942   PROT 7.6 05/23/2016 1307   ALBUMIN 4.0 09/05/2016 0942   ALBUMIN 4.0 05/23/2016 1307     Studies/Results: No results found.  Anti-infectives: Anti-infectives    Start     Dose/Rate Route Frequency Ordered Stop   09/08/16 2200  clindamycin (CLEOCIN) IVPB 900 mg     900 mg 100 mL/hr over 30 Minutes Intravenous Every 8 hours 09/08/16 1955 09/08/16 2200   09/08/16 1734  clindamycin (CLEOCIN) 900 mg, gentamicin (GARAMYCIN) 240 mg in sodium chloride 0.9 % 1,000 mL for intraperitoneal lavage  Status:  Discontinued       As needed 09/08/16 1734 09/08/16 1754   09/08/16 1053  neomycin (MYCIFRADIN) tablet 1,000 mg  Status:  Discontinued     1,000 mg Oral 3 times per day 09/08/16 1053 09/08/16 1955   09/08/16 1053  metroNIDAZOLE (FLAGYL) tablet 1,000 mg  Status:  Discontinued     1,000 mg Oral  3 times per day 09/08/16 1053 09/08/16 1955   09/07/16 1145  clindamycin (CLEOCIN) 900 mg, gentamicin (GARAMYCIN) 240 mg in sodium chloride 0.9 % 1,000 mL for intraperitoneal lavage  Status:  Discontinued      Intraperitoneal To Surgery 09/07/16 1132 09/08/16 1955   09/07/16 1132  clindamycin (CLEOCIN) IVPB 900 mg     900 mg 100 mL/hr over 30 Minutes Intravenous 60 min pre-op 09/07/16 1132 09/08/16 1343   09/07/16 1132  gentamicin (GARAMYCIN) 410 mg in dextrose 5 % 100 mL IVPB     5 mg/kg  82.1 kg 110.3 mL/hr over 60 Minutes Intravenous 60 min pre-op 09/07/16 1132 09/08/16 1323       Dhruva Orndoff J 09/10/2016

## 2016-09-11 NOTE — Progress Notes (Signed)
3 Days Post-Op  Subjective: Better control - able to make it to bathroom before bowel movements Doesn't like the drain Frequent urination - but this was present prior to surgery due to BPH Still quite sore  Objective: Vital signs in last 24 hours: Temp:  [98.9 F (37.2 C)-100.2 F (37.9 C)] 98.9 F (37.2 C) (01/28 0606) Pulse Rate:  [65-77] 65 (01/28 0606) Resp:  [18] 18 (01/28 0606) BP: (127-149)/(57-69) 134/62 (01/28 0606) SpO2:  [97 %-100 %] 97 % (01/28 0606) Weight:  [79.4 kg (175 lb)] 79.4 kg (175 lb) (01/28 0700) Last BM Date: 09/11/16  Intake/Output from previous day: 01/27 0701 - 01/28 0700 In: -  Out: 1055 [Urine:1000; Drains:55] Intake/Output this shift: Total I/O In: -  Out: 200 [Urine:200]  General appearance: alert, cooperative and no distress Resp: clear to auscultation bilaterally Cardio: regular rate and rhythm, S1, S2 normal, no murmur, click, rub or gallop GI: active bowel sounds; soft, incisional tenderness; RLQ drain with serosanguinous drainage Incision - dry crusted drainage on dressing  Lab Results:   Recent Labs  09/09/16 0505  WBC 14.6*  HGB 13.6  HCT 39.5  PLT 270   BMET  Recent Labs  09/09/16 0505  NA 134*  K 4.2  CL 102  CO2 26  GLUCOSE 119*  BUN 16  CREATININE 1.17  CALCIUM 8.3*   PT/INR No results for input(s): LABPROT, INR in the last 72 hours. ABG No results for input(s): PHART, HCO3 in the last 72 hours.  Invalid input(s): PCO2, PO2  Studies/Results: No results found.  Anti-infectives: Anti-infectives    Start     Dose/Rate Route Frequency Ordered Stop   09/08/16 2200  clindamycin (CLEOCIN) IVPB 900 mg     900 mg 100 mL/hr over 30 Minutes Intravenous Every 8 hours 09/08/16 1955 09/08/16 2200   09/08/16 1734  clindamycin (CLEOCIN) 900 mg, gentamicin (GARAMYCIN) 240 mg in sodium chloride 0.9 % 1,000 mL for intraperitoneal lavage  Status:  Discontinued       As needed 09/08/16 1734 09/08/16 1754   09/08/16 1053   neomycin (MYCIFRADIN) tablet 1,000 mg  Status:  Discontinued     1,000 mg Oral 3 times per day 09/08/16 1053 09/08/16 1955   09/08/16 1053  metroNIDAZOLE (FLAGYL) tablet 1,000 mg  Status:  Discontinued     1,000 mg Oral 3 times per day 09/08/16 1053 09/08/16 1955   09/07/16 1145  clindamycin (CLEOCIN) 900 mg, gentamicin (GARAMYCIN) 240 mg in sodium chloride 0.9 % 1,000 mL for intraperitoneal lavage  Status:  Discontinued      Intraperitoneal To Surgery 09/07/16 1132 09/08/16 1955   09/07/16 1132  clindamycin (CLEOCIN) IVPB 900 mg     900 mg 100 mL/hr over 30 Minutes Intravenous 60 min pre-op 09/07/16 1132 09/08/16 1343   09/07/16 1132  gentamicin (GARAMYCIN) 410 mg in dextrose 5 % 100 mL IVPB     5 mg/kg  82.1 kg 110.3 mL/hr over 60 Minutes Intravenous 60 min pre-op 09/07/16 1132 09/08/16 1323      Assessment/Plan: Principal Problem:   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 Active Problems:   TOBACCO ABUSE   Chronic hepatitis C ([place])     Plan:  Continue soft diet Continue Kegel exercises Possible discharge by Dr. Johney Maine tomorrow - may remove drain prior to discharge.   LOS: 3 days    Celester Lech K. 09/11/2016

## 2016-09-11 NOTE — Progress Notes (Signed)
Pt complains that the tip of his tongue is numb and that food doesn't taste the same as it did yesterday. Pt states that he thinks it may be because he has not smoked a cigarette in three days. I looked up medications that patient is taking for this possible side effect and found nothing. I told the patient to inform me if it got worse or if his tongue started swelling. I will continue to monitor. [place]

## 2016-09-12 MED ORDER — NICOTINE 14 MG/24HR TD PT24: 14.0000 mg | MEDICATED_PATCH | Freq: Every day | TRANSDERMAL | Status: DC

## 2016-09-12 NOTE — Discharge Summary (Signed)
Physician Discharge Summary  Patient ID: Phillip Maynard. MRN: [id number] DOB/AGE: 1941-03-07  76 y.o.  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  No evidence of cancer by colonoscopy or CT scan.  Seem to be diverticulitis since inflammation and diverticular thickening in the distal sigmoid colon   I recommended segmental resection:  The anatomy & physiology of the digestive tract was discussed.  The pathophysiology was discussed.  Natural history risks without surgery was discussed.   I worked to give an overview of the disease and the frequent need to have multispecialty involvement.  I feel the risks of no intervention will lead to serious problems that outweigh the operative risks; therefore, I recommended a partial colectomy to remove the pathology.  Laparoscopic & open techniques were discussed.   Risks such as bleeding, infection, abscess, leak, reoperation, possible ostomy, hernia, heart attack, death, and other risks were discussed.  I noted a good likelihood this will help address the problem.   Goals of post-operative recovery were discussed as well.  We will  work to minimize complications.  Educational materials on the pathology had been given in the office.  Questions were answered.    The patient expressed understanding & wished to proceed with surgery.  OR FINDINGS:   Patient had very thickened sigmoid colon densely adherent to the trigone of the bladder, especially in the left corner.  Ureter on left side pulled up near it but not intimately involved   No obvious metastatic disease on visceral parietal peritoneum or liver.  The anastomosis rests 10 cm from the anal verge by rigid proctoscopy.   Consults: urology  Hospital Course:   The patient underwent the surgery above.  Postoperatively, the patient gradually mobilized and advanced to a solid diet.  Pain and other symptoms were treated aggressively.    By the time of discharge, the patient was walking well the hallways, eating food, having flatus.  Pain was well-controlled on an oral medications.  Based on meeting discharge criteria and continuing to recover, I felt it was safe for the patient to be discharged from the hospital to further recover with close followup. Postoperative recommendations were discussed in detail.  They are written as well.  Discharged Condition: good  Disposition:  Follow-up Information    Juandiego Kolenovic C., MD. Schedule an appointment as soon as possible for a visit in 2 weeks.   Specialty:  General Surgery Why:  To follow up after your operation, To follow up after your hospital stay Contact information: [address] [place] [address] [phone]           01-Home or Self Care  Discharge Instructions    Call MD for:    Complete by:  As directed  FEVER > 101.5 F  (temperatures < 101.5 F are not significant)   Call MD for:    Complete by:  As directed    FEVER > 101.5 F (Temperatures <101.66F can occasionally happen and are not significant)   Call MD for:  extreme fatigue    Complete by:  As directed    Call MD  for:  extreme fatigue    Complete by:  As directed    Call MD for:  persistant dizziness or light-headedness    Complete by:  As directed    Call MD for:  persistant dizziness or light-headedness    Complete by:  As directed    Call MD for:  persistant nausea and vomiting    Complete by:  As directed    Call MD for:  persistant nausea and vomiting    Complete by:  As directed    Call MD for:  redness, tenderness, or signs of infection (pain, swelling, redness, odor or green/yellow discharge around incision site)    Complete by:  As directed    Call MD for:  redness, tenderness, or signs of infection (pain, swelling, redness, odor or green/yellow discharge around incision site)    Complete by:  As directed    Call MD for:  severe uncontrolled pain    Complete by:  As directed    Call MD for:  severe uncontrolled pain    Complete by:  As directed    Diet - low sodium heart healthy    Complete by:  As directed    Follow a light diet the first few days at home.   Start with a bland diet such as soups, liquids, starchy foods, low fat foods, etc.   If you feel full, bloated, or constipated, stay on a full liquid or pureed/blenderized diet for a few days until you feel better and no longer constipated. Be sure to drink plenty of fluids every day to avoid getting dehydrated (feeling dizzy, not urinating, etc.). Gradually add a fiber supplement to your diet   Diet - low sodium heart healthy    Complete by:  As directed    Follow a light diet the first few days at home.    If you feel full, bloated, or constipated, stay on a liquid diet until you feel better and not constipated. Gradually get back to a solid diet.  Avoid fast food or heavy meals the first week as you are more likely to get nauseated. It is expected for your digestive tract to need a few months to get back to normal.   Discharge instructions    Complete by:  As directed    See Discharge Instructions If you are not getting  better after two weeks or are noticing you are getting worse, contact our office [phone] for further advice.  We may need to adjust your medications, re-evaluate you in the office, send you to the emergency room, or see what other things we can do to help. The clinic staff is available to answer your questions during regular business hours (8:30am-5pm).  Please don't hesitate to call and ask to speak to one of our nurses for clinical concerns.    A surgeon from [facility] is always on call at the hospitals 24 hours/day If you have a medical emergency, go to the nearest emergency room or call 911.   Discharge wound care:    Complete by:  As directed    You have an  open wound. If you have an open wound with a wound vac, see wound vac care instructions. If the wound is being packed, see wound care instructions.    In general, it is encouraged that you remove your dressing and packing, shower with soap & water, and replace your dressing once a day.   Pack the wound with clean gauze moistened with normal (0.9%) saline to keep the wound moist & uninfected.   Pressure on the dressing for 30 minutes will stop most wound bleeding.   Eventually your body will heal & pull the open wound closed over the next few months.  Raw open wounds will occasionally bleed or secrete yellow drainage until it heals closed.   Drain sites will drain a little until the drain is removed.   Even closed incisions can have mild bleeding or drainage the first few days until the skin edges scab over & seal.   Driving Restrictions    Complete by:  As directed    You may drive when you are no longer taking narcotic prescription pain medication, you can comfortably wear a seatbelt, and you can safely make sudden turns/stops to protect yourself without hesitating due to pain.   Driving Restrictions    Complete by:  As directed    You may drive when you are no longer taking narcotic prescription pain medication,  you can comfortably wear a seatbelt, and you can safely make sudden turns/stops to protect yourself without hesitating due to pain.   Increase activity slowly    Complete by:  As directed    Start light daily activities --- self-care, walking, climbing stairs- beginning the day after surgery.  Gradually increase activities as tolerated.  Control your pain to be active.  Stop when you are tired.  Ideally, walk several times a day, eventually an hour a day.   Most people are back to most day-to-day activities in a few weeks.  It takes 4-8 weeks to get back to unrestricted, intense activity. If you can walk 30 minutes without difficulty, it is safe to try more intense activity such as jogging, treadmill, bicycling, low-impact aerobics, swimming, etc. Save the most intensive and strenuous activity for last (Usually 4-8 weeks after surgery) such as sit-ups, heavy lifting, contact sports, etc.  Refrain from any intense heavy lifting or straining until you are off narcotics for pain control.  You will have off days, but things should improve week-by-week. DO NOT PUSH THROUGH PAIN.  Let pain be your guide: If it hurts to do something, don't do it.  Pain is your body warning you to avoid that activity for another week until the pain goes down.   Increase activity slowly    Complete by:  As directed    Lifting restrictions    Complete by:  As directed    If you can walk 30 minutes without difficulty, it is safe to try more intense activity such as jogging, treadmill, bicycling, low-impact aerobics, swimming, etc. Save the most intensive and strenuous activity for last (Usually 4-8 weeks after surgery) such as sit-ups, heavy lifting, contact sports, etc.  Refrain from any intense heavy lifting or straining until you are off narcotics for pain control.  You will have off days, but things should improve week-by-week. DO NOT PUSH THROUGH PAIN.  Let pain be your guide: If it hurts to do something, don't do it.  Pain  is your body warning you to avoid that activity for another week until the pain goes  down.   Lifting restrictions    Complete by:  As directed    Start light daily activities --- self-care, walking, climbing stairs- beginning the day after surgery.   Gradually increase activities as tolerated.   Control your pain to be active.   Stop when you are tired.   Ideally, walk several times a day, eventually an hour a day.   Most people are back to most day-to-day activities in a few weeks.  It takes 4-8 weeks to get back to unrestricted, intense activity. If you can walk 30 minutes without difficulty, it is safe to try more intense activity such as jogging, treadmill, bicycling, low-impact aerobics, swimming, etc. Save the most intensive and strenuous activity for last (Usually 4-8 weeks after surgery) such as sit-ups, heavy lifting, contact sports, etc.   Refrain from any intense heavy lifting or straining until you are off narcotics for pain control.  You will have off days, but things should improve week-by-week. DO NOT PUSH THROUGH PAIN.   Let pain be your guide: If it hurts to do something, don't do it.  Pain is your body warning you to avoid that activity for another week until the pain goes down.   May shower / Bathe    Complete by:  As directed    May walk up steps    Complete by:  As directed    May walk up steps    Complete by:  As directed    No wound care    Complete by:  As directed    It is good for closed incision and even open wounds to be washed every day.  Shower every day.  Short baths are fine.  Wash the incisions and wounds clean with soap & water.    If you have a closed incision(s), wash the incision with soap & water every day.  You may leave closed incisions open to air if it is dry.   You may cover the incision with clean gauze & replace it after your daily shower for comfort. If you have skin tapes (Steristrips) or skin glue (Dermabond) on your incision, leave them in  place.  They will fall off on their own like a scab.  You may trim any edges that curl up with clean scissors.  If you have staples, set up an appointment for them to be removed in the office in 10 days after surgery.  If you have a drain, wash around the skin exit site with soap & water and place a new dressing of gauze or band aid around the skin every day.  Keep the drain site clean & dry.   Sexual Activity Restrictions    Complete by:  As directed    You may have sexual intercourse when it is comfortable. If it hurts to do something, stop.   Sexual Activity Restrictions    Complete by:  As directed    You may have sexual intercourse when it is comfortable. If it hurts to do something, stop.      Allergies as of 09/12/2016      Reactions   Penicillins Anaphylaxis   Shellfish Allergy Anaphylaxis      Medication List    TAKE these medications   finasteride 5 MG tablet Commonly known as:  PROSCAR Take 5 mg by mouth daily.   losartan-hydrochlorothiazide 100-12.5 MG tablet Commonly known as:  HYZAAR Take 0.5 tablets by mouth daily.   methocarbamol 750 MG tablet Commonly known as:  ROBAXIN Take 1 tablet (750 mg total) by mouth 4 (four) times daily as needed (use for muscle cramps/pain).   tamsulosin 0.4 MG Caps capsule Commonly known as:  FLOMAX Take 0.4 mg by mouth daily. Reported on 09/23/2015   traMADol 50 MG tablet Commonly known as:  ULTRAM Take 1-2 tablets (50-100 mg total) by mouth every 6 (six) hours as needed for moderate pain or severe pain.       Significant Diagnostic Studies:  No results found for this or any previous visit (from the past 72 hour(s)).  No results found.  Discharge Exam: Blood pressure 113/77, pulse 65, temperature 98.4 F (36.9 C), temperature source Oral, resp. rate 18, height 5' 7.25" (1.708 m), weight 79 kg (174 lb 1.6 oz), SpO2 98 %.  General: Pt awake/alert/oriented x4 in No acute distress Eyes: PERRL, normal EOM.  Sclera clear.  No  icterus Neuro: CN II-XII intact w/o focal sensory/motor deficits. Lymph: No head/neck/groin lymphadenopathy Psych:  No delerium/psychosis/paranoia HENT: Normocephalic, Mucus membranes moist.  No thrush Neck: Supple, No tracheal deviation Chest: No chest wall pain w good excursion CV:  Pulses intact.  Regular rhythm MS: Normal AROM mjr joints.  No obvious deformity Abdomen: Soft.  Nondistended.  Mildly tender at incisions only.  No evidence of peritonitis.  No incarcerated hernias. Ext:  SCDs BLE.  No mjr edema.  No cyanosis Skin: No petechiae / purpura  Past Medical History:  Diagnosis Date  . Asthma    "when I was a boy" (04/08/2013)  . Bronchitis   . Colon polyps   . Colovesical fistula   . Diverticulosis   . GERD (gastroesophageal reflux disease)   . Hepatitis C    treated with injections  . High cholesterol   . Hypertension   . Type II diabetes mellitus (HCC)    hx of . No longer on medicine    Past Surgical History:  Procedure Laterality Date  . CYSTOSCOPY N/A 09/08/2016   Procedure: FIREFLY INJECTIONS;  Surgeon: Malen Gauze, MD;  Location: [facility];  Service: Urology;  Laterality: N/A;  . INCISION AND DRAINAGE OF WOUND Right 1970's   "leg" (04/08/2013)  . LIVER BIOPSY  ~ 2011  . PROCTOSCOPY N/A 09/08/2016   Procedure: RIGID PROCTOSCOPY;  Surgeon: Karie Soda, MD;  Location: [facility];  Service: General;  Laterality: N/A;  . TONSILLECTOMY      Social History   Social History  . Marital status: Married    Spouse name: N/A  . Number of children: 2  . Years of education: N/A   Occupational History  . truck Designer, [employer]    retired   Social History Main Topics  . Smoking status: Current Every Day Smoker    Packs/day: 0.25    Years: 45.00    Types: Cigarettes  . Smokeless tobacco: Never Used  . Alcohol use Yes     Comment: 04/08/2013 "haven't drank nothing in > 3 yr; before then I'd have at least 1 pint/day"  . Drug use: No  . Sexual activity: Yes    Other Topics Concern  . Not on file   Social History Narrative  . No narrative on file    Family History  Problem Relation Age of Onset  . Asthma Mother   . Heart attack Mother     Current Facility-Administered Medications  Medication Dose Route Frequency Provider Last Rate Last Dose  . 0.9 %  sodium chloride infusion  250 mL Intravenous PRN Karie Soda, MD      .  acetaminophen (TYLENOL) tablet 1,000 mg  1,000 mg Oral Q8H Karie Soda, MD   1,000 mg at 09/12/16 0532  . alum & mag hydroxide-simeth (MAALOX/MYLANTA) 200-200-20 MG/5ML suspension 30 mL  30 mL Oral Q6H PRN Karie Soda, MD   30 mL at 09/09/16 1153  . diphenhydrAMINE (BENADRYL) 12.5 MG/5ML elixir 12.5 mg  12.5 mg Oral Q6H PRN Karie Soda, MD       Or  . diphenhydrAMINE (BENADRYL) injection 12.5 mg  12.5 mg Intravenous Q6H PRN Karie Soda, MD      . enalaprilat (VASOTEC) injection 0.625-1.25 mg  0.625-1.25 mg Intravenous Q6H PRN Karie Soda, MD      . enoxaparin (LOVENOX) injection 40 mg  40 mg Subcutaneous Q24H Karie Soda, MD   40 mg at 09/11/16 8119  . finasteride (PROSCAR) tablet 5 mg  5 mg Oral Daily Karie Soda, MD   5 mg at 09/11/16 0934  . hydrALAZINE (APRESOLINE) injection 5-20 mg  5-20 mg Intravenous Q6H PRN Karie Soda, MD      . losartan (COZAAR) tablet 50 mg  50 mg Oral Daily Karie Soda, MD   50 mg at 09/11/16 1478   And  . hydrochlorothiazide 10 mg/mL oral suspension 6.25 mg  6.25 mg Oral Daily Karie Soda, MD   6.25 mg at 09/11/16 0934  . HYDROmorphone (DILAUDID) injection 0.5-2 mg  0.5-2 mg Intravenous Q1H PRN Karie Soda, MD   0.5 mg at 09/09/16 0830  . lip balm (CARMEX) ointment 1 application  1 application Topical BID Karie Soda, MD   1 application at 09/11/16 2200  . magic mouthwash  15 mL Oral QID PRN Karie Soda, MD      . menthol-cetylpyridinium (CEPACOL) lozenge 3 mg  1 lozenge Oral PRN Karie Soda, MD      . methocarbamol (ROBAXIN) tablet 1,000 mg  1,000 mg Oral TID Karie Soda, MD    1,000 mg at 09/11/16 2151  . metoprolol (LOPRESSOR) injection 5 mg  5 mg Intravenous Q6H PRN Karie Soda, MD      . metoprolol tartrate (LOPRESSOR) tablet 12.5 mg  12.5 mg Oral Q12H PRN Karie Soda, MD      . multivitamin with minerals tablet 1 tablet  1 tablet Oral Daily Karie Soda, MD   1 tablet at 09/11/16 0934  . nicotine (NICODERM CQ - dosed in mg/24 hours) patch 14 mg  14 mg Transdermal Daily Karie Soda, MD      . phenol (CHLORASEPTIC) mouth spray 2 spray  2 spray Mouth/Throat PRN Karie Soda, MD      . prochlorperazine (COMPAZINE) injection 5-10 mg  5-10 mg Intravenous Q4H PRN Karie Soda, MD      . saccharomyces boulardii (FLORASTOR) capsule 250 mg  250 mg Oral BID Karie Soda, MD   250 mg at 09/11/16 2152  . sodium chloride flush (NS) 0.9 % injection 3 mL  3 mL Intravenous Q12H Karie Soda, MD   3 mL at 09/11/16 2200  . sodium chloride flush (NS) 0.9 % injection 3 mL  3 mL Intravenous PRN Karie Soda, MD      . tamsulosin [facility]) capsule 0.4 mg  0.4 mg Oral Daily Karie Soda, MD   0.4 mg at 09/11/16 0934  . traMADol (ULTRAM) tablet 50-100 mg  50-100 mg Oral Q6H PRN Karie Soda, MD   50 mg at 09/11/16 2030  . vitamin C (ASCORBIC ACID) tablet 500 mg  500 mg Oral BID Karie Soda, MD   500 mg at 09/11/16  2150  . zolpidem (AMBIEN) tablet 5 mg  5 mg Oral QHS PRN Karie Soda, MD   5 mg at 09/11/16 2151     Allergies  Allergen Reactions  . Penicillins Anaphylaxis  . Shellfish Allergy Anaphylaxis    Signed: Lorenso Courier, M.D., F.A.C.S. Gastrointestinal and Minimally Invasive Surgery [facility] [address] [place], [place] [address] [phone] Main / Paging   09/12/2016, 8:23 AM

## 2016-09-12 NOTE — Progress Notes (Signed)
Pt tolerating diet, pain is under control and vitals are WNL. Discussed discharge instructions with both patient and wife. Discharged to home with prescriptions.

## 2016-09-12 NOTE — Op Note (Signed)
Preoperative diagnosis: colovesical fistula  Postoperative diagnosis: Same  Procedure: 1 cystoscopy 2. Retrograde Instillation of indocyanine green into bilateral ureters  Attending: Nicolette Bang  Anesthesia: General  Estimated blood loss: Minimal  Drains: 16 French foley  Specimens: none  Antibiotics: cefoxitin  Findings: Ureteral orifices in normal anatomic location. Multiple erythematous lesions in the bladder.   Indications: Patient is a 76 year old male with a history of colovesical fistula who is undergoing repair of the fistula with general surgery. Urology was consulted for instillation of indocyanine green for assistance in identifying the ureters. After discussing treatment options, they decided proceed with  cystoscopy and instillation of indocyanine green..  Procedure her in detail: The patient was brought to the operating room and a brief timeout was done to ensure correct patient, correct procedure, correct site. General anesthesia was administered patient was placed in dorsal lithotomy position. Their genitalia was then prepped and draped in usual sterile fashion. A rigid 60 French cystoscope was passed in the urethra and the bladder. Bladder was inspected and we noted numerous erythematous lesions int he posterior wall and dome of the bladder. the ureteral orifices were in the normal orthotopic locations. a 6 french ureteral catheter was then instilled into the left ureteral orifice and we then instilled 5cc of indocyanine green. We then turned our attention to the right side. a 6 french ureteral catheter and we then instilled 5cc of indocyanine green.  the bladder was then drained, a 16 French foley was placed and this concluded the procedure which was well tolerated by patient.  Complications: None  Condition: Stable  Plan: Patient will have a voiding trial post operative day 1-2.

## 2016-09-15 NOTE — Addendum Note (Signed)
Addendum  created 09/15/16 1702 by Nolon Nations, MD   Anesthesia Staff edited

## 2016-09-19 ENCOUNTER — Encounter: Payer: Self-pay | Admitting: Gastroenterology

## 2016-10-01 ENCOUNTER — Emergency Department (HOSPITAL_COMMUNITY)
Admission: EM | Admit: 2016-10-01 | Discharge: 2016-10-01 | Disposition: A | Discharge status: Home / Self Care | Payer: Medicare HMO | Attending: Emergency Medicine | Admitting: Emergency Medicine

## 2016-10-01 ENCOUNTER — Encounter (HOSPITAL_COMMUNITY): Payer: Self-pay | Admitting: Emergency Medicine

## 2016-10-01 DIAGNOSIS — I10 Essential (primary) hypertension: Secondary | ICD-10-CM | POA: Diagnosis not present

## 2016-10-01 DIAGNOSIS — J45909 Unspecified asthma, uncomplicated: Secondary | ICD-10-CM | POA: Insufficient documentation

## 2016-10-01 DIAGNOSIS — F17210 Nicotine dependence, cigarettes, uncomplicated: Secondary | ICD-10-CM | POA: Insufficient documentation

## 2016-10-01 DIAGNOSIS — Z79899 Other long term (current) drug therapy: Secondary | ICD-10-CM | POA: Diagnosis not present

## 2016-10-01 DIAGNOSIS — R197 Diarrhea, unspecified: Secondary | ICD-10-CM

## 2016-10-01 DIAGNOSIS — E119 Type 2 diabetes mellitus without complications: Secondary | ICD-10-CM | POA: Diagnosis not present

## 2016-10-01 LAB — COMPREHENSIVE METABOLIC PANEL
ALT: 14 U/L — AB (ref 17–63)
AST: 17 U/L (ref 15–41)
Albumin: 3.8 g/dL (ref 3.5–5.0)
Alkaline Phosphatase: 48 U/L (ref 38–126)
Anion gap: 9 (ref 5–15)
BILIRUBIN TOTAL: 0.7 mg/dL (ref 0.3–1.2)
BUN: 11 mg/dL (ref 6–20)
CALCIUM: 9.2 mg/dL (ref 8.9–10.3)
CHLORIDE: 98 mmol/L — AB (ref 101–111)
CO2: 27 mmol/L (ref 22–32)
Creatinine, Ser: 0.99 mg/dL (ref 0.61–1.24)
Glucose, Bld: 140 mg/dL — ABNORMAL HIGH (ref 65–99)
Potassium: 3.5 mmol/L (ref 3.5–5.1)
Sodium: 134 mmol/L — ABNORMAL LOW (ref 135–145)
TOTAL PROTEIN: 7.9 g/dL (ref 6.5–8.1)

## 2016-10-01 LAB — CBC
HCT: 39.3 % (ref 39.0–52.0)
Hemoglobin: 13.7 g/dL (ref 13.0–17.0)
MCH: 30 pg (ref 26.0–34.0)
MCHC: 34.9 g/dL (ref 30.0–36.0)
MCV: 86 fL (ref 78.0–100.0)
PLATELETS: 408 10*3/uL — AB (ref 150–400)
RBC: 4.57 MIL/uL (ref 4.22–5.81)
RDW: 13.5 % (ref 11.5–15.5)
WBC: 10.8 10*3/uL — AB (ref 4.0–10.5)

## 2016-10-01 LAB — URINALYSIS, ROUTINE W REFLEX MICROSCOPIC
Bilirubin Urine: NEGATIVE
GLUCOSE, UA: NEGATIVE mg/dL
Hgb urine dipstick: NEGATIVE
KETONES UR: NEGATIVE mg/dL
LEUKOCYTES UA: NEGATIVE
NITRITE: NEGATIVE
PROTEIN: NEGATIVE mg/dL
Specific Gravity, Urine: 1.006 (ref 1.005–1.030)
pH: 5 (ref 5.0–8.0)

## 2016-10-01 LAB — LIPASE, BLOOD: LIPASE: 35 U/L (ref 11–51)

## 2016-10-01 NOTE — ED Notes (Signed)
Pt aware that he needs to give a urine specimen and will let us know when able.

## 2016-10-01 NOTE — ED Triage Notes (Signed)
Pt complaint of diarrhea over past 4-5 days; xi robotic lysis of adhesions lower anterior resection of sigmoid colon with colovesical fistula repair 1/25.

## 2016-10-01 NOTE — ED Provider Notes (Signed)
[facility] DEPT Provider Note   CSN: [id number] Arrival date & time: 10/01/16  1834     History   Chief Complaint Chief Complaint  Patient presents with  . Diarrhea    HPI Phillip Oke. is a 76 y.o. male.  The history is provided by the patient.  Diarrhea   This is a new problem. Episode onset: 6 days. The problem occurs 5 to 10 times per day. The problem has been gradually worsening. Diarrhea characteristics: soft, loose. looks like "clay" There has been no fever. Pertinent negatives include no abdominal pain, no vomiting, no chills, no sweats, no arthralgias, no myalgias, no URI and no cough. Treatments tried: Called Dr. Johney Maine' office and told to take fiber, which has improved his symptoms. His past medical history is significant for recent abdominal surgery (partial colectomy for colovesicular fistula).    Past Medical History:  Diagnosis Date  . Asthma    "when I was a boy" (04/08/2013)  . Bronchitis   . Colon polyps   . Colovesical fistula   . Diverticulosis   . GERD (gastroesophageal reflux disease)   . Hepatitis C    treated with injections  . High cholesterol   . Hypertension   . Type II diabetes mellitus (HCC)    hx of . No longer on medicine    Patient Active Problem List   Diagnosis Date Noted  . 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 Diverticulitis of colon 07/12/2016  . Severe sepsis(995.92) 04/05/2013  . Pyelonephritis 04/05/2013  . Hypertension 02/28/2013  . Type 2 diabetes mellitus ([place]) 02/28/2013  . Chronic hepatitis C ([place]) 02/14/2011  . Urinary (tract) obstruction 02/14/2011  . TOBACCO ABUSE 12/10/2007  . 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 CYSTOSCOPY N/A 09/08/2016   Procedure: FIREFLY INJECTIONS;  Surgeon: Cleon Gustin, MD;  Location: [facility];  Service: Urology;  Laterality: N/A;  . INCISION AND DRAINAGE OF WOUND Right 1970's   "leg"  (04/08/2013)  . LIVER BIOPSY  ~ 2011  . PROCTOSCOPY N/A 09/08/2016   Procedure: RIGID PROCTOSCOPY;  Surgeon: Michael Boston, MD;  Location: [facility];  Service: General;  Laterality: N/A;  . TONSILLECTOMY         Home Medications    Prior to Admission medications   Medication Sig Start Date End Date Taking? Authorizing Provider  finasteride (PROSCAR) 5 MG tablet Take 5 mg by mouth at bedtime.    Yes Historical Provider, MD  losartan-hydrochlorothiazide (HYZAAR) 100-12.5 MG tablet Take 0.5 tablets by mouth daily. 05/23/16  Yes Wendie Agreste, MD  tamsulosin (FLOMAX) 0.4 MG CAPS capsule Take 0.4 mg by mouth at bedtime.    Yes Historical Provider, MD    Family History Family History  Problem Relation Age of Onset  . Asthma Mother   . Heart attack Mother     Social History Social History  Substance Use Topics  . Smoking status: Current Every Day Smoker    Packs/day: 0.25    Years: 45.00    Types: Cigarettes  . Smokeless tobacco: Never Used  . Alcohol use Yes     Comment: 04/08/2013 "haven't drank nothing in > 3 yr; before then I'd have at least 1 pint/day"     Allergies   Penicillins and Shellfish allergy   Review of Systems Review of Systems  Constitutional: Negative for chills.  Respiratory: Negative for cough.   Gastrointestinal: Positive for diarrhea. Negative for abdominal  pain and vomiting.  Musculoskeletal: Negative for arthralgias and myalgias.  Ten systems are reviewed and are negative for acute change except as noted in the HPI   Physical Exam Updated Vital Signs BP 136/70 (BP Location: Left Arm)   Pulse 68   Temp 98.1 F (36.7 C) (Oral)   Resp 18   Ht 5\' 8"  (1.727 m)   Wt 179 lb (81.2 kg)   SpO2 99%   BMI 27.22 kg/m   Physical Exam  Constitutional: He is oriented to person, place, and time. He appears well-developed and well-nourished. No distress.  HENT:  Head: Normocephalic and atraumatic.  Nose: Nose normal.  Eyes: Conjunctivae and EOM are  normal. Pupils are equal, round, and reactive to light. Right eye exhibits no discharge. Left eye exhibits no discharge. No scleral icterus.  Neck: Normal range of motion. Neck supple.  Cardiovascular: Normal rate and regular rhythm.  Exam reveals no gallop and no friction rub.   No murmur heard. Pulmonary/Chest: Effort normal and breath sounds normal. No stridor. No respiratory distress. He has no rales.  Abdominal: Soft. He exhibits no distension. There is no tenderness.  Surgical incisions clean and intact.  Musculoskeletal: He exhibits no edema or tenderness.  Neurological: He is alert and oriented to person, place, and time.  Skin: Skin is warm and dry. No rash noted. He is not diaphoretic. No erythema.  Psychiatric: He has a normal mood and affect.  Vitals reviewed.    ED Treatments / Results  Labs (all labs ordered are listed, but only abnormal results are displayed) Labs Reviewed  COMPREHENSIVE METABOLIC PANEL - Abnormal; Notable for the following:       Result Value   Sodium 134 (*)    Chloride 98 (*)    Glucose, Bld 140 (*)    ALT 14 (*)    All other components within normal limits  CBC - Abnormal; Notable for the following:    WBC 10.8 (*)    Platelets 408 (*)    All other components within normal limits  URINALYSIS, ROUTINE W REFLEX MICROSCOPIC - Abnormal; Notable for the following:    Color, Urine STRAW (*)    All other components within normal limits  LIPASE, BLOOD    EKG  EKG Interpretation None       Radiology No results found.  Procedures Procedures (including critical care time)  Medications Ordered in ED Medications - No data to display   Initial Impression / Assessment and Plan / ED Course  I have reviewed the triage vital signs and the nursing notes.  Pertinent labs & imaging results that were available during my care of the patient were reviewed by me and considered in my medical decision making (see chart for details).     Nonbloody  diarrhea in the setting of recent abdominal surgery. Abdomen benign. Labs grossly reassuring. Do not feel that advanced imaging is necessary at this time. Low suspicion for C. Difficile. Likely functional diarrhea. We'll have patient continue with Metamucil regimen given to him by the surgery clinic, perform close monitoring of his symptoms. Patient has scheduled appointment with Dr. Johney Maine is a 1-1/2 weeks. Safe for discharge with strict return precautions.  Final Clinical Impressions(s) / ED Diagnoses   Final diagnoses:  Diarrhea, unspecified type   Disposition: Discharge  Condition: Good  I have discussed the results, Dx and Tx plan with the patient who expressed understanding and agree(s) with the plan. Discharge instructions discussed at great length. The patient was  given strict return precautions who verbalized understanding of the instructions. No further questions at time of discharge.    Discharge Medication List as of 10/01/2016 11:17 PM      Follow Up: Wendie Agreste, MD [place] [id number] [phone]  Schedule an appointment as soon as possible for a visit  As needed  Michael Boston, MD [place] [address] [phone]   as scheduled      Fatima Blank, MD 10/02/16 [phone]

## 2016-10-26 NOTE — Progress Notes (Signed)
Lab only 

## 2016-12-31 ENCOUNTER — Other Ambulatory Visit: Payer: Self-pay | Admitting: Family Medicine

## 2016-12-31 DIAGNOSIS — I10 Essential (primary) hypertension: Secondary | ICD-10-CM

## 2017-01-19 ENCOUNTER — Encounter: Payer: Self-pay | Admitting: Family Medicine

## 2017-01-19 ENCOUNTER — Ambulatory Visit (INDEPENDENT_AMBULATORY_CARE_PROVIDER_SITE_OTHER): Payer: Commercial Managed Care - HMO | Admitting: Family Medicine

## 2017-01-19 ENCOUNTER — Ambulatory Visit (INDEPENDENT_AMBULATORY_CARE_PROVIDER_SITE_OTHER): Payer: Commercial Managed Care - HMO

## 2017-01-19 VITALS — BP 138/67 | HR 65 | Temp 98.0°F | Resp 18 | Ht 67.32 in | Wt 184.2 lb

## 2017-01-19 DIAGNOSIS — Z720 Tobacco use: Secondary | ICD-10-CM | POA: Diagnosis not present

## 2017-01-19 DIAGNOSIS — R059 Cough, unspecified: Secondary | ICD-10-CM

## 2017-01-19 DIAGNOSIS — R0981 Nasal congestion: Secondary | ICD-10-CM

## 2017-01-19 DIAGNOSIS — E119 Type 2 diabetes mellitus without complications: Secondary | ICD-10-CM | POA: Diagnosis not present

## 2017-01-19 DIAGNOSIS — Z0000 Encounter for general adult medical examination without abnormal findings: Secondary | ICD-10-CM | POA: Diagnosis not present

## 2017-01-19 DIAGNOSIS — Z13220 Encounter for screening for lipoid disorders: Secondary | ICD-10-CM | POA: Diagnosis not present

## 2017-01-19 DIAGNOSIS — I10 Essential (primary) hypertension: Secondary | ICD-10-CM | POA: Diagnosis not present

## 2017-01-19 DIAGNOSIS — F17210 Nicotine dependence, cigarettes, uncomplicated: Secondary | ICD-10-CM

## 2017-01-19 DIAGNOSIS — R05 Cough: Secondary | ICD-10-CM

## 2017-01-19 MED ORDER — FLUTICASONE PROPIONATE 50 MCG/ACT NA SUSP: 2.0000 | Freq: Every day | NASAL | 6 refills | Status: DC

## 2017-01-19 MED ORDER — LOSARTAN POTASSIUM-HCTZ 100-12.5 MG PO TABS: 0.5000 | ORAL_TABLET | Freq: Every day | ORAL | 1 refills | Status: DC

## 2017-01-19 NOTE — Progress Notes (Signed)
   Subjective:    Patient ID: Phillip Barrette., male    DOB: May 25, 1941, 76 y.o.   MRN: [id number]  HPI    Review of Systems  Constitutional: Negative.   HENT: Positive for dental problem, sinus pressure and sneezing.   Eyes: Positive for redness.  Respiratory: Positive for cough.   Cardiovascular: Negative.   Gastrointestinal: Negative.   Endocrine: Negative.   Genitourinary: Negative.   Musculoskeletal: Negative.   Skin: Negative.   Allergic/Immunologic: Positive for food allergies.  Neurological: Negative.   Hematological: Negative.   Psychiatric/Behavioral: Negative.        Objective:   Physical Exam        Assessment & Plan:
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
## 2017-01-19 NOTE — Patient Instructions (Addendum)
return for fasting labs at lab only visit within the next 1 week.   No change in meds for now.  Bring a copy of your living will/advanced directives when those are completed.   I will  check a chest x-ray today for your cough, but that may be related due to congestion with allergies. Start Flonase nasal spray, Mucinex over-the-counter as needed, and recheck within the next 1 week.  sooner if you are having fevers shortness of breath or wheezing. [place] offers smoking cessation clinics. Registration is required. To register call [phone] or register online at [email or web address]  Keeping you healthy  Get these tests  Blood pressure- Have your blood pressure checked once a year by your healthcare provider.  Normal blood pressure is 120/80  Weight- Have your body mass index (BMI) calculated to screen for obesity.  BMI is a measure of body fat based on height and weight. You can also calculate your own BMI at [email or web address].  Cholesterol- Have your cholesterol checked every year.  Diabetes- Have your blood sugar checked regularly if you have high blood pressure, high cholesterol, have a family history of diabetes or if you are overweight.  Screening for Colon Cancer- Colonoscopy starting at age 22.  Screening may begin sooner depending on your family history and other health conditions. Follow up colonoscopy as directed by your Gastroenterologist.  Screening for Prostate Cancer- Both blood work (PSA) and a rectal exam help screen for Prostate Cancer.  Screening begins at age 40 with African-American men and at age 65 with Caucasian men.  Screening may begin sooner depending on your family history.  Take these medicines  Aspirin- One aspirin daily can help prevent Heart disease and Stroke.  Flu shot- Every fall.  Tetanus- Every 10 years.  Zostavax- Once after the age of 58 to prevent Shingles.  Pneumonia shot- Once after the age of 64; if you are younger than 72, ask  your healthcare provider if you need a Pneumonia shot.  Take these steps  Don't smoke- If you do smoke, talk to your doctor about quitting.  For tips on how to quit, go to www.smokefree.gov or call 1-800-QUIT-NOW.  Be physically active- Exercise 5 days a week for at least 30 minutes.  If you are not already physically active start slow and gradually work up to 30 minutes of moderate physical activity.  Examples of moderate activity include walking briskly, mowing the yard, dancing, swimming, bicycling, etc.  Eat a healthy diet- Eat a variety of healthy food such as fruits, vegetables, low fat milk, low fat cheese, yogurt, lean meant, poultry, fish, beans, tofu, etc. For more information go to www.thenutritionsource.org  Drink alcohol in moderation- Limit alcohol intake to less than two drinks a day. Never drink and drive.  Dentist- Brush and floss twice daily; visit your dentist twice a year.  Depression- Your emotional health is as important as your physical health. If you're feeling down, or losing interest in things you would normally enjoy please talk to your healthcare provider.  Eye exam- Visit your eye doctor every year.  Safe sex- If you may be exposed to a sexually transmitted infection, use a condom.  Seat belts- Seat belts can save your life; always wear one.  Smoke/Carbon Monoxide detectors- These detectors need to be installed on the appropriate level of your home.  Replace batteries at least once a year.  Skin cancer- When out in the sun, cover up and use sunscreen 15 SPF or higher.  Violence- If  anyone is threatening you, please tell your healthcare provider.  Living Will/ Health care power of attorney- Speak with your healthcare provider and family. Cough, Adult Coughing is a reflex that clears your throat and your airways. Coughing helps to heal and protect your lungs. It is normal to cough occasionally, but a cough that happens with other symptoms or lasts a long time  may be a sign of a condition that needs treatment. A cough may last only 2-3 weeks (acute), or it may last longer than 8 weeks (chronic). What are the causes? Coughing is commonly caused by:  Breathing in substances that irritate your lungs.  A viral or bacterial respiratory infection.  Allergies.  Asthma.  Postnasal drip.  Smoking.  Acid backing up from the stomach into the esophagus (gastroesophageal reflux).  Certain medicines.  Chronic lung problems, including COPD (or rarely, lung cancer).  Other medical conditions such as heart failure.  Follow these instructions at home: Pay attention to any changes in your symptoms. Take these actions to help with your discomfort:  Take medicines only as told by your health care provider. ? If you were prescribed an antibiotic medicine, take it as told by your health care provider. Do not stop taking the antibiotic even if you start to feel better. ? Talk with your health care provider before you take a cough suppressant medicine.  Drink enough fluid to keep your urine clear or pale yellow.  If the air is dry, use a cold steam vaporizer or humidifier in your bedroom or your home to help loosen secretions.  Avoid anything that causes you to cough at work or at home.  If your cough is worse at night, try sleeping in a semi-upright position.  Avoid cigarette smoke. If you smoke, quit smoking. If you need help quitting, ask your health care provider.  Avoid caffeine.  Avoid alcohol.  Rest as needed.  Contact a health care provider if:  You have new symptoms.  You cough up pus.  Your cough does not get better after 2-3 weeks, or your cough gets worse.  You cannot control your cough with suppressant medicines and you are losing sleep.  You develop pain that is getting worse or pain that is not controlled with pain medicines.  You have a fever.  You have unexplained weight loss.  You have night sweats. Get help right  away if:  You cough up blood.  You have difficulty breathing.  Your heartbeat is very fast. This information is not intended to replace advice given to you by your health care provider. Make sure you discuss any questions you have with your health care provider. Document Released: 01/28/2011 Document Revised: 01/07/2016 Document Reviewed: 10/08/2014 Elsevier Interactive Patient Education  2017 [employer].  Steps to Quit Smoking Smoking tobacco can be harmful to your health and can affect almost every organ in your body. Smoking puts you, and those around you, at risk for developing many serious chronic diseases. Quitting smoking is difficult, but it is one of the best things that you can do for your health. It is never too late to quit. What are the benefits of quitting smoking? When you quit smoking, you lower your risk of developing serious diseases and conditions, such as:  Lung cancer or lung disease, such as COPD.  Heart disease.  Stroke.  Heart attack.  Infertility.  Osteoporosis and bone fractures.  Additionally, symptoms such as coughing, wheezing, and shortness of breath may get better when you quit.  You may also find that you get sick less often because your body is stronger at fighting off colds and infections. If you are pregnant, quitting smoking can help to reduce your chances of having a baby of low birth weight. How do I get ready to quit? When you decide to quit smoking, create a plan to make sure that you are successful. Before you quit:  Pick a date to quit. Set a date within the next two weeks to give you time to prepare.  Write down the reasons why you are quitting. Keep this list in places where you will see it often, such as on your bathroom mirror or in your car or wallet.  Identify the people, places, things, and activities that make you want to smoke (triggers) and avoid them. Make sure to take these actions: ? Throw away all cigarettes at home, at  work, and in your car. ? Throw away smoking accessories, such as Scientist, research (medical). ? Clean your car and make sure to empty the ashtray. ? Clean your home, including curtains and carpets.  Tell your family, friends, and coworkers that you are quitting. Support from your loved ones can make quitting easier.  Talk with your health care provider about your options for quitting smoking.  Find out what treatment options are covered by your health insurance.  What strategies can I use to quit smoking? Talk with your healthcare provider about different strategies to quit smoking. Some strategies include:  Quitting smoking altogether instead of gradually lessening how much you smoke over a period of time. Research shows that quitting "cold [place]" is more successful than gradually quitting.  Attending in-person counseling to help you build problem-solving skills. You are more likely to have success in quitting if you attend several counseling sessions. Even short sessions of 10 minutes can be effective.  Finding resources and support systems that can help you to quit smoking and remain smoke-free after you quit. These resources are most helpful when you use them often. They can include: ? Online chats with a Social worker. ? Telephone quitlines. ? Careers information officer. ? Support groups or group counseling. ? Text messaging programs. ? Mobile phone applications.  Taking medicines to help you quit smoking. (If you are pregnant or breastfeeding, talk with your health care provider first.) Some medicines contain nicotine and some do not. Both types of medicines help with cravings, but the medicines that include nicotine help to relieve withdrawal symptoms. Your health care provider may recommend: ? Nicotine patches, gum, or lozenges. ? Nicotine inhalers or sprays. ? Non-nicotine medicine that is taken by mouth.  Talk with your health care provider about combining strategies, such as taking  medicines while you are also receiving in-person counseling. Using these two strategies together makes you more likely to succeed in quitting than if you used either strategy on its own. If you are pregnant or breastfeeding, talk with your health care provider about finding counseling or other support strategies to quit smoking. Do not take medicine to help you quit smoking unless told to do so by your health care provider. What things can I do to make it easier to quit? Quitting smoking might feel overwhelming at first, but there is a lot that you can do to make it easier. Take these important actions:  Reach out to your family and friends and ask that they support and encourage you during this time. Call telephone quitlines, reach out to support groups, or work with a counselor  for support.  Ask people who smoke to avoid smoking around you.  Avoid places that trigger you to smoke, such as bars, parties, or smoke-break areas at work.  Spend time around people who do not smoke.  Lessen stress in your life, because stress can be a smoking trigger for some people. To lessen stress, try: ? Exercising regularly. ? Deep-breathing exercises. ? Yoga. ? Meditating. ? Performing a body scan. This involves closing your eyes, scanning your body from head to toe, and noticing which parts of your body are particularly tense. Purposefully relax the muscles in those areas.  Download or purchase mobile phone or tablet apps (applications) that can help you stick to your quit plan by providing reminders, tips, and encouragement. There are many free apps, such as QuitGuide from the [employer] Office manager for Disease Control and Prevention). You can find other support for quitting smoking (smoking cessation) through smokefree.gov and other websites.  How will I feel when I quit smoking? Within the first 24 hours of quitting smoking, you may start to feel some withdrawal symptoms. These symptoms are usually most noticeable  2-3 days after quitting, but they usually do not last beyond 2-3 weeks. Changes or symptoms that you might experience include:  Mood swings.  Restlessness, anxiety, or irritation.  Difficulty concentrating.  Dizziness.  Strong cravings for sugary foods in addition to nicotine.  Mild weight gain.  Constipation.  Nausea.  Coughing or a sore throat.  Changes in how your medicines work in your body.  A depressed mood.  Difficulty sleeping (insomnia).  After the first 2-3 weeks of quitting, you may start to notice more positive results, such as:  Improved sense of smell and taste.  Decreased coughing and sore throat.  Slower heart rate.  Lower blood pressure.  Clearer skin.  The ability to breathe more easily.  Fewer sick days.  Quitting smoking is very challenging for most people. Do not get discouraged if you are not successful the first time. Some people need to make many attempts to quit before they achieve long-term success. Do your best to stick to your quit plan, and talk with your health care provider if you have any questions or concerns. This information is not intended to replace advice given to you by your health care provider. Make sure you discuss any questions you have with your health care provider. Document Released: 07/26/2001 Document Revised: 03/29/2016 Document Reviewed: 12/16/2014 Elsevier Interactive Patient Education  2017 [employer].    IF you received an x-ray today, you will receive an invoice from [facility]. Please contact [facility] at [phone] with questions or concerns regarding your invoice.   IF you received labwork today, you will receive an invoice from [facility]. Please contact [facility] at [phone] with questions or concerns regarding your invoice.   Our billing staff will not be able to assist you with questions regarding bills from these companies.  You will be contacted with the lab results as soon  as they are available. The fastest way to get your results is to activate your My Chart account. Instructions are located on the last page of this paperwork. If you have not heard from [place] regarding the results in 2 weeks, please contact this office.

## 2017-01-20 ENCOUNTER — Ambulatory Visit: Payer: Medicare HMO | Admitting: Physician Assistant

## 2017-01-20 DIAGNOSIS — E119 Type 2 diabetes mellitus without complications: Secondary | ICD-10-CM

## 2017-01-20 DIAGNOSIS — Z13220 Encounter for screening for lipoid disorders: Secondary | ICD-10-CM | POA: Diagnosis not present

## 2017-01-21 ENCOUNTER — Telehealth: Payer: Self-pay | Admitting: Emergency Medicine

## 2017-01-21 LAB — HEMOGLOBIN A1C
Est. average glucose Bld gHb Est-mCnc: 154 mg/dL
Hgb A1c MFr Bld: 7 % — ABNORMAL HIGH (ref 4.8–5.6)

## 2017-01-21 LAB — COMPREHENSIVE METABOLIC PANEL
ALBUMIN: 4.1 g/dL (ref 3.5–4.8)
ALT: 12 IU/L (ref 0–44)
AST: 15 IU/L (ref 0–40)
Albumin/Globulin Ratio: 1.2 (ref 1.2–2.2)
Alkaline Phosphatase: 55 IU/L (ref 39–117)
BUN / CREAT RATIO: 10 (ref 10–24)
BUN: 12 mg/dL (ref 8–27)
Bilirubin Total: 0.3 mg/dL (ref 0.0–1.2)
CO2: 22 mmol/L (ref 18–29)
CREATININE: 1.17 mg/dL (ref 0.76–1.27)
Calcium: 9.6 mg/dL (ref 8.6–10.2)
Chloride: 100 mmol/L (ref 96–106)
GFR calc non Af Amer: 61 mL/min/{1.73_m2} (ref >59)
GFR, EST AFRICAN AMERICAN: 71 mL/min/{1.73_m2} (ref >59)
GLUCOSE: 121 mg/dL — AB (ref 65–99)
Globulin, Total: 3.3 g/dL (ref 1.5–4.5)
Potassium: 4.7 mmol/L (ref 3.5–5.2)
Sodium: 142 mmol/L (ref 134–144)
TOTAL PROTEIN: 7.4 g/dL (ref 6.0–8.5)

## 2017-01-21 LAB — LIPID PANEL
Chol/HDL Ratio: 4.2 ratio (ref 0.0–5.0)
Cholesterol, Total: 187 mg/dL (ref 100–199)
HDL: 45 mg/dL (ref >39)
LDL CALC: 114 mg/dL — AB (ref 0–99)
Triglycerides: 141 mg/dL (ref 0–149)
VLDL CHOLESTEROL CAL: 28 mg/dL (ref 5–40)

## 2017-01-21 NOTE — Telephone Encounter (Signed)
-----   Message from Wendie Agreste, MD sent at 01/20/2017  1:17 PM EDT ----- Call patient.  Chest x-ray showed chronic bronchitis changes, there were no signs of pneumonia. Continue Flonase nasal spray, Mucinex, and follow-up next week as planned.

## 2017-01-26 ENCOUNTER — Ambulatory Visit (INDEPENDENT_AMBULATORY_CARE_PROVIDER_SITE_OTHER): Payer: Medicare HMO | Admitting: Family Medicine

## 2017-01-26 ENCOUNTER — Encounter: Payer: Self-pay | Admitting: Family Medicine

## 2017-01-26 ENCOUNTER — Encounter (HOSPITAL_COMMUNITY): Payer: Self-pay

## 2017-01-26 ENCOUNTER — Emergency Department (HOSPITAL_COMMUNITY)
Admission: EM | Admit: 2017-01-26 | Discharge: 2017-01-27 | Disposition: A | Discharge status: Home / Self Care | Payer: Medicare HMO | Attending: Emergency Medicine | Admitting: Emergency Medicine

## 2017-01-26 VITALS — BP 148/74 | HR 60 | Temp 97.9°F | Resp 18 | Ht 67.5 in | Wt 185.2 lb

## 2017-01-26 DIAGNOSIS — I10 Essential (primary) hypertension: Secondary | ICD-10-CM | POA: Diagnosis not present

## 2017-01-26 DIAGNOSIS — E119 Type 2 diabetes mellitus without complications: Secondary | ICD-10-CM | POA: Insufficient documentation

## 2017-01-26 DIAGNOSIS — Z720 Tobacco use: Secondary | ICD-10-CM

## 2017-01-26 DIAGNOSIS — J45909 Unspecified asthma, uncomplicated: Secondary | ICD-10-CM | POA: Diagnosis not present

## 2017-01-26 DIAGNOSIS — R059 Cough, unspecified: Secondary | ICD-10-CM

## 2017-01-26 DIAGNOSIS — Z79899 Other long term (current) drug therapy: Secondary | ICD-10-CM | POA: Diagnosis not present

## 2017-01-26 DIAGNOSIS — H9202 Otalgia, left ear: Secondary | ICD-10-CM | POA: Diagnosis present

## 2017-01-26 DIAGNOSIS — F17210 Nicotine dependence, cigarettes, uncomplicated: Secondary | ICD-10-CM | POA: Diagnosis not present

## 2017-01-26 DIAGNOSIS — H6122 Impacted cerumen, left ear: Secondary | ICD-10-CM

## 2017-01-26 DIAGNOSIS — E785 Hyperlipidemia, unspecified: Secondary | ICD-10-CM

## 2017-01-26 DIAGNOSIS — R05 Cough: Secondary | ICD-10-CM | POA: Diagnosis not present

## 2017-01-26 MED ORDER — PRAVASTATIN SODIUM 20 MG PO TABS: 20.0000 mg | ORAL_TABLET | Freq: Every day | ORAL | 0 refills | Status: DC

## 2017-01-26 MED ORDER — HYDROGEN PEROXIDE 3 % EX SOLN
Freq: Once | CUTANEOUS | Status: AC
Start: 2017-01-26 — End: 2017-01-26
  Administered 2017-01-26: 23:00:00 via TOPICAL
  Filled 2017-01-26: qty 473

## 2017-01-26 MED ORDER — DOCUSATE SODIUM 50 MG/5ML PO LIQD
50.0000 mg | Freq: Once | ORAL | Status: AC
  Administered 2017-01-26: 50 mg via ORAL
  Filled 2017-01-26: qty 10

## 2017-01-26 NOTE — Progress Notes (Signed)
By signing my name below, I, Phillip Maynard, attest that this documentation has been prepared under the direction and in the presence of Phillip Ray, MD.  Electronically Signed: Verlee Maynard, Medical Scribe. 01/26/17. 9:00 AM.  Subjective:    Patient ID: Phillip Maynard., male    DOB: 1941-07-28, 76 y.o.   MRN: [id number]  HPI Chief Complaint  Patient presents with  . Pneumonia  . Follow-up    HPI Comments: Phillip Maynard. is a 76 y.o. male who presents to Primary Care at [facility] for CXR follow-up. See last office visit on June 7th. At that time he had been experiencing a cough for approx 1 month. Was trying OTC cough syrup. Positive tobacco use, 1 pack every few days. He had a CXR indicating bronchitic changes without apparent PNA. Thought to have possible component of allergies. He was started on Flonase, also recommended Mucinex and follow-up today. 10 year heart disease risk is 51%, and smoking cessation cuts it down significantly.  Reports his cough has improved with flonase, and he rarely takes mucinex. He has a cough "every now and then", but it has greatly improved since last visit. He still smokes one third of a pack a day and would like chantix. Pt would like cessation help. Pt has never been on a cholesterol medication. Denies fever, SOB, wheezing, or other acute sxs.  DM: He's on diet control only. Pt missed his ophthalmologist appt. Reports he needs to stop eating Phillip Maynard and from the candy bowl he has for his grandkids. Lab Results  Component Value Date   HGBA1C 7.0 (H) 01/20/2017   Lab Results  Component Value Date   MICROALBUR 11.5 09/23/2015   HTN: Takes losartan-HCTZ one half pill QD. Pt has only missed one dose of his medication and he checks his bp. He took losartan-HCTZ today. Lab Results  Component Value Date   CREATININE 1.17 01/20/2017   Pt is considering getting a DOT physical.   Patient Active Problem List   Diagnosis Date  Noted  . 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 Diverticulitis of colon 07/12/2016  . Severe sepsis(995.92) 04/05/2013  . Pyelonephritis 04/05/2013  . Hypertension 02/28/2013  . Type 2 diabetes mellitus ([place]) 02/28/2013  . Chronic hepatitis C ([place]) 02/14/2011  . Urinary (tract) obstruction 02/14/2011  . TOBACCO ABUSE 12/10/2007  . 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 Asthma    "when I was a boy" (04/08/2013)  . Bronchitis   . Colon polyps   . Colovesical fistula   . Diverticulosis   . GERD (gastroesophageal reflux disease)   . Hepatitis C    treated with injections  . High cholesterol   . Hypertension   . Type II diabetes mellitus (HCC)    hx of . No longer on medicine   Past Surgical History:  Procedure Laterality Date  . CYSTOSCOPY N/A 09/08/2016   Procedure: FIREFLY INJECTIONS;  Surgeon: Cleon Gustin, MD;  Location: [facility];  Service: Urology;  Laterality: N/A;  . INCISION AND DRAINAGE OF WOUND Right 1970's   "leg" (04/08/2013)  . LIVER BIOPSY  ~ 2011  . PROCTOSCOPY N/A 09/08/2016   Procedure: RIGID PROCTOSCOPY;  Surgeon: Michael Boston, MD;  Location: [facility];  Service: General;  Laterality: N/A;  . TONSILLECTOMY     Allergies  Allergen Reactions  . Penicillins Anaphylaxis and Other (See Comments)    Has patient had a PCN reaction  causing immediate rash, facial/tongue/throat swelling, SOB or lightheadedness with hypotension: Yes Has patient had a PCN reaction causing severe rash involving mucus membranes or skin necrosis: No Has patient had a PCN reaction that required hospitalization No Has patient had a PCN reaction occurring within the last 10 years: No If all of the above answers are "NO", then may proceed with Cephalosporin use.  . Shellfish Allergy Anaphylaxis   Prior to Admission medications   Medication Sig Start Date End Date Taking? Authorizing Provider  finasteride (PROSCAR) 5 MG  tablet Take 5 mg by mouth at bedtime.    Yes [provider]  fluticasone (FLONASE) 50 MCG/ACT nasal spray Place 2 sprays into both nostrils daily. 01/19/17  Yes Wendie Agreste, MD  losartan-hydrochlorothiazide (HYZAAR) 100-12.5 MG tablet Take 0.5 tablets by mouth daily. 01/19/17  Yes Wendie Agreste, MD  tamsulosin (FLOMAX) 0.4 MG CAPS capsule Take 0.4 mg by mouth at bedtime.    Yes [provider]   Social History   Social History  . Marital status: Married    Spouse name: N/A  . Number of children: 2  . 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 Smoking status: Current Every Day Smoker    Packs/day: 0.25    Years: 45.00    Types: Cigarettes  . Smokeless tobacco: Never Used  . Alcohol use Yes     Comment: 04/08/2013 "haven't drank nothing in > 3 yr; before then I'd have at least 1 pint/day"  . Drug use: No  . Sexual activity: Yes   Other Topics Concern  . Not on file   Social History Narrative  . No narrative on file   Review of Systems  Constitutional: Negative for fever.  Respiratory: Positive for cough. Negative for shortness of breath and wheezing.    Objective:  Physical Exam  Constitutional: He appears well-developed and well-nourished. No distress.  HENT:  Head: Normocephalic and atraumatic.  Right Ear: External ear normal.  Left Ear: External ear normal.  Nose: Nose normal.  Mouth/Throat: Oropharynx is clear and moist.  Eyes: Conjunctivae are normal.  Neck: Neck supple.  Cardiovascular: Normal rate, regular rhythm and normal heart sounds.  Exam reveals no gallop and no friction rub.   No murmur heard. Pulmonary/Chest: Effort normal and breath sounds normal. No respiratory distress. He has no wheezes. He has no rales.  Neurological: He is alert.  Skin: Skin is warm and dry.  Psychiatric: He has a normal mood and affect. His behavior is normal.  Nursing note  and vitals reviewed.   Vitals:   01/26/17 0832 01/26/17 0835 01/26/17 0921  BP: (!) 176/69 (!) 154/69 (!) 148/74  Pulse: 60    Resp: 18    Temp: 97.9 F (36.6 C)    TempSrc: Oral    SpO2: 96%    Weight: 185 lb 3.2 oz (84 kg)    Height: 5' 7.5" (1.715 m)    Body mass index is 28.58 kg/m. Assessment & Plan:    Bladyn Tipps. is a 76 y.o. male Cough  - Improved with allergy treatment. Smoking cessation discussed as below, RTC precautions  Essential hypertension - Plan: Care order/instruction:  -Elevated, discussed need for improved control. If it remains over 140/90  at home, return/call for med changes.  Type 2 diabetes mellitus without complication, without long-term current use of insulin (HCC)  -Diet control,  discussed diet changes for lessening blood sugar. Advised to reschedule appointment for ophthalmology/eye care provider eval.  Hyperlipidemia, unspecified hyperlipidemia type  -With diabetes, and ASCVD risk/ACC guidelines, will start statin. Initially pravastatin, but may need to transition to Lipitor or Crestor with underlying diabetes.  Tobacco abuse  -Cessation discussed. Also discussed impact on ASCVD risk.  Resources provided, not ready yet for Chantix prescription. Over 3 minutes of counseling.  Meds ordered this encounter  Medications  . pravastatin (PRAVACHOL) 20 MG tablet    Sig: Take 1 tablet (20 mg total) by mouth daily.    Dispense:  90 tablet    Refill:  0   Patient Instructions    Reschedule eye specialist appointment for diabetic eye screening.   Keep a record of your blood pressures outside of the office and if remaining over 140/90 - return to discuss med changes.   Ok to continue flonase for allergies.  If cough returns or worsens, then return to discuss other treatment.   [facility] offers smoking cessation clinics. Registration is required. To register call [phone] or register online at [email or web address] Let me know when you are  ready to start chantix.   Start pravastatin for cholesterol, recheck in 6 weeks.     Steps to Quit Smoking Smoking tobacco can be harmful to your health and can affect almost every organ in your body. Smoking puts you, and those around you, at risk for developing many serious chronic diseases. Quitting smoking is difficult, but it is one of the best things that you can do for your health. It is never too late to quit. What are the benefits of quitting smoking? When you quit smoking, you lower your risk of developing serious diseases and conditions, such as:  Lung cancer or lung disease, such as COPD.  Heart disease.  Stroke.  Heart attack.  Infertility.  Osteoporosis and bone fractures.  Additionally, symptoms such as coughing, wheezing, and shortness of breath may get better when you quit. You may also find that you get sick less often because your body is stronger at fighting off colds and infections. If you are pregnant, quitting smoking can help to reduce your chances of having a baby of low birth weight. How do I get ready to quit? When you decide to quit smoking, create a plan to make sure that you are successful. Before you quit:  Pick a date to quit. Set a date within the next two weeks to give you time to prepare.  Write down the reasons why you are quitting. Keep this list in places where you will see it often, such as on your bathroom mirror or in your car or wallet.  Identify the people, places, things, and activities that make you want to smoke (triggers) and avoid them. Make sure to take these actions: ? Throw away all cigarettes at home, at work, and in your car. ? Throw away smoking accessories, such as Scientist, research (medical). ? Clean your car and make sure to empty the ashtray. ? Clean your home, including curtains and carpets.  Tell your family, friends, and coworkers that you are quitting. Support from your loved ones can make quitting easier.  Talk with your  health care provider about your options for quitting smoking.  Find out what treatment options are covered by your health insurance.  What strategies can I use to quit smoking? Talk with your healthcare provider about different strategies to quit smoking. Some strategies include:  Quitting smoking altogether instead  of gradually lessening how much you smoke over a period of time. Research shows that quitting "cold [place]" is more successful than gradually quitting.  Attending in-person counseling to help you build problem-solving skills. You are more likely to have success in quitting if you attend several counseling sessions. Even short sessions of 10 minutes can be effective.  Finding resources and support systems that can help you to quit smoking and remain smoke-free after you quit. These resources are most helpful when you use them often. They can include: ? Online chats with a Social worker. ? Telephone quitlines. ? Careers information officer. ? Support groups or group counseling. ? Text messaging programs. ? Mobile phone applications.  Taking medicines to help you quit smoking. (If you are pregnant or breastfeeding, talk with your health care provider first.) Some medicines contain nicotine and some do not. Both types of medicines help with cravings, but the medicines that include nicotine help to relieve withdrawal symptoms. Your health care provider may recommend: ? Nicotine patches, gum, or lozenges. ? Nicotine inhalers or sprays. ? Non-nicotine medicine that is taken by mouth.  Talk with your health care provider about combining strategies, such as taking medicines while you are also receiving in-person counseling. Using these two strategies together makes you more likely to succeed in quitting than if you used either strategy on its own. If you are pregnant or breastfeeding, talk with your health care provider about finding counseling or other support strategies to quit smoking. Do  not take medicine to help you quit smoking unless told to do so by your health care provider. What things can I do to make it easier to quit? Quitting smoking might feel overwhelming at first, but there is a lot that you can do to make it easier. Take these important actions:  Reach out to your family and friends and ask that they support and encourage you during this time. Call telephone quitlines, reach out to support groups, or work with a counselor for support.  Ask people who smoke to avoid smoking around you.  Avoid places that trigger you to smoke, such as bars, parties, or smoke-break areas at work.  Spend time around people who do not smoke.  Lessen stress in your life, because stress can be a smoking trigger for some people. To lessen stress, try: ? Exercising regularly. ? Deep-breathing exercises. ? Yoga. ? Meditating. ? Performing a body scan. This involves closing your eyes, scanning your body from head to toe, and noticing which parts of your body are particularly tense. Purposefully relax the muscles in those areas.  Download or purchase mobile phone or tablet apps (applications) that can help you stick to your quit plan by providing reminders, tips, and encouragement. There are many free apps, such as QuitGuide from the [employer] Office manager for Disease Control and Prevention). You can find other support for quitting smoking (smoking cessation) through smokefree.gov and other websites.  How will I feel when I quit smoking? Within the first 24 hours of quitting smoking, you may start to feel some withdrawal symptoms. These symptoms are usually most noticeable 2-3 days after quitting, but they usually do not last beyond 2-3 weeks. Changes or symptoms that you might experience include:  Mood swings.  Restlessness, anxiety, or irritation.  Difficulty concentrating.  Dizziness.  Strong cravings for sugary foods in addition to nicotine.  Mild weight  gain.  Constipation.  Nausea.  Coughing or a sore throat.  Changes in how your medicines work in your body.  A depressed mood.  Difficulty sleeping (insomnia).  After the first 2-3 weeks of quitting, you may start to notice more positive results, such as:  Improved sense of smell and taste.  Decreased coughing and sore throat.  Slower heart rate.  Lower blood pressure.  Clearer skin.  The ability to breathe more easily.  Fewer sick days.  Quitting smoking is very challenging for most people. Do not get discouraged if you are not successful the first time. Some people need to make many attempts to quit before they achieve long-term success. Do your best to stick to your quit plan, and talk with your health care provider if you have any questions or concerns. This information is not intended to replace advice given to you by your health care provider. Make sure you discuss any questions you have with your health care provider. Document Released: 07/26/2001 Document Revised: 03/29/2016 Document Reviewed: 12/16/2014 Elsevier Interactive Patient Education  2017 [employer].      IF you received an x-Maynard today, you will receive an invoice from [facility]. Please contact [facility] at [phone] with questions or concerns regarding your invoice.   IF you received labwork today, you will receive an invoice from [facility]. Please contact [facility] at [phone] with questions or concerns regarding your invoice.   Our billing staff will not be able to assist you with questions regarding bills from these companies.  You will be contacted with the lab results as soon as they are available. The fastest way to get your results is to activate your My Chart account. Instructions are located on the last page of this paperwork. If you have not heard from [place] regarding the results in 2 weeks, please contact this office.       I personally performed the services  described in this documentation, which was scribed in my presence. The recorded information has been reviewed and considered for accuracy and completeness, addended by me as needed, and agree with information above.  Signed,   Phillip Ray, MD [facility] at [facility].  01/27/17 3:35 PM

## 2017-01-26 NOTE — ED Provider Notes (Signed)
[facility] DEPT Provider Note   CSN: [id number] Arrival date & time: 01/26/17  1940  By signing my name below, I, Margit Banda, attest that this documentation has been prepared under the direction and in the presence of Janetta Hora, PA-C. Electronically Signed: Margit Banda, ED Scribe. 01/26/17. 9:19 PM.  History   Chief Complaint Chief Complaint  Patient presents with  . Otalgia    HPI Phillip Maynard. is a 76 y.o. male who presents to the Emergency Department complaining of left ear discomfort that started ~ 30 minutes PTA. Pt put a q-tip in his ear and notes it feels like it is clogged up. The q-tip didn't break off in his ear. Pt thinks he "compacted"  He reports associated muffled hearing and an "echo". He tried to put hydrogen peroxide in his ear earlier before he came with no relief. Pt denies fever and pain.  The history is provided by the patient. No language interpreter was used.    Past Medical History:  Diagnosis Date  . Asthma    "when I was a boy" (04/08/2013)  . Bronchitis   . Colon polyps   . Colovesical fistula   . Diverticulosis   . GERD (gastroesophageal reflux disease)   . Hepatitis C    treated with injections  . High cholesterol   . Hypertension   . Type II diabetes mellitus (HCC)    hx of . No longer on medicine    Patient Active Problem List   Diagnosis Date Noted  . 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 Diverticulitis of colon 07/12/2016  . Severe sepsis(995.92) 04/05/2013  . Pyelonephritis 04/05/2013  . Hypertension 02/28/2013  . Type 2 diabetes mellitus ([place]) 02/28/2013  . Chronic hepatitis C ([place]) 02/14/2011  . Urinary (tract) obstruction 02/14/2011  . TOBACCO ABUSE 12/10/2007  . 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 CYSTOSCOPY N/A 09/08/2016   Procedure: FIREFLY INJECTIONS;  Surgeon: Cleon Gustin, MD;  Location: [facility];  Service:  Urology;  Laterality: N/A;  . INCISION AND DRAINAGE OF WOUND Right 1970's   "leg" (04/08/2013)  . LIVER BIOPSY  ~ 2011  . PROCTOSCOPY N/A 09/08/2016   Procedure: RIGID PROCTOSCOPY;  Surgeon: Michael Boston, MD;  Location: [facility];  Service: General;  Laterality: N/A;  . TONSILLECTOMY         Home Medications    Prior to Admission medications   Medication Sig Start Date End Date Taking? Authorizing Provider  finasteride (PROSCAR) 5 MG tablet Take 5 mg by mouth at bedtime.     [provider]  fluticasone (FLONASE) 50 MCG/ACT nasal spray Place 2 sprays into both nostrils daily. 01/19/17   Wendie Agreste, MD  losartan-hydrochlorothiazide (HYZAAR) 100-12.5 MG tablet Take 0.5 tablets by mouth daily. 01/19/17   Wendie Agreste, MD  pravastatin (PRAVACHOL) 20 MG tablet Take 1 tablet (20 mg total) by mouth daily. 01/26/17   Wendie Agreste, MD  tamsulosin (FLOMAX) 0.4 MG CAPS capsule Take 0.4 mg by mouth at bedtime.     [provider]    Family History Family History  Problem Relation Age of Onset  . Asthma Mother   . Heart attack Mother     Social History Social History  Substance Use Topics  . Smoking status: Current Every Day Smoker    Packs/day: 0.25    Years: 45.00    Types: Cigarettes  . Smokeless tobacco: Never Used  .  Alcohol use Yes     Comment: 04/08/2013 "haven't drank nothing in > 3 yr; before then I'd have at least 1 pint/day"     Allergies   Penicillins and Shellfish allergy   Review of Systems Review of Systems  Constitutional: Negative for fever.  HENT: Positive for hearing loss (muffled hearing). Negative for ear pain.        +Left ear discomfort. -Left ear swelling.     Physical Exam Updated Vital Signs BP 103/82 (BP Location: Right Arm)   Pulse (!) 59   Temp 98.3 F (36.8 C) (Oral)   Resp 16   Ht 5' 7.5" (1.715 m)   Wt 185 lb (83.9 kg)   SpO2 100%   BMI 28.55 kg/m   Physical Exam  Constitutional: He is oriented to person,  place, and time. He appears well-developed and well-nourished. No distress.  HENT:  Head: Normocephalic and atraumatic.  Right Ear: Hearing, tympanic membrane, external ear and ear canal normal.  Left Ear: Hearing, external ear and ear canal normal.  Cerumen impaction of the left ear.  Eyes: Conjunctivae and EOM are normal. Pupils are equal, round, and reactive to light. Right eye exhibits no discharge. Left eye exhibits no discharge. No scleral icterus.  Neck: Normal range of motion.  Cardiovascular: Normal rate.   Pulmonary/Chest: Effort normal. No respiratory distress.  Abdominal: He exhibits no distension.  Musculoskeletal: Normal range of motion.  Neurological: He is alert and oriented to person, place, and time.  Skin: Skin is warm and dry.  Psychiatric: He has a normal mood and affect. His behavior is normal.  Nursing note and vitals reviewed.    ED Treatments / Results  DIAGNOSTIC STUDIES: Oxygen Saturation is 100% on RA, normal by my interpretation.   COORDINATION OF CARE: 9:19 PM-Discussed next steps with pt. Pt verbalized understanding and is agreeable with the plan.   Labs (all labs ordered are listed, but only abnormal results are displayed) Labs Reviewed - No data to display  EKG  EKG Interpretation None       Radiology No results found.  Procedures .Ear Cerumen Removal Date/Time: 01/27/2017 12:17 AM Performed by: Janetta Hora MARIE Authorized by: Janetta Hora MARIE   Consent:    Consent obtained:  Verbal   Consent given by:  Patient   Risks discussed:  Bleeding, infection, TM perforation, pain, incomplete removal and dizziness   Alternatives discussed:  No treatment Procedure details:    Location:  L ear   Procedure type: irrigation   Post-procedure details:    Inspection:  Bleeding (small scratch in ear canal)   Hearing quality:  Improved (unchanged)   Patient tolerance of procedure:  Tolerated well, no immediate complications   (including  critical care time)   Medications Ordered in ED Medications - No data to display   Initial Impression / Assessment and Plan / ED Course  I have reviewed the triage vital signs and the nursing notes.  Pertinent labs & imaging results that were available during my care of the patient were reviewed by me and considered in my medical decision making (see chart for details).  76 year old male with L sided cerumen impaction. Vitals are normal. Initially I attempted disimpaction with a curette but this was unsuccessful. Wax is removed by irrigation afterwards. He tolerated well and reported improvement in symptoms. TM was visualized after procedure and is intact. Instructions for home care to prevent wax buildup are given.  Final Clinical Impressions(s) / ED Diagnoses  Final diagnoses:  Impacted cerumen of left ear    New Prescriptions New Prescriptions   No medications on file   I personally performed the services described in this documentation, which was scribed in my presence. The recorded information has been reviewed and is accurate.     Recardo Evangelist, PA-C 01/27/17 Revonda Standard, MD 01/28/17 [phone]

## 2017-01-26 NOTE — ED Triage Notes (Signed)
Pt endorses sticking a q-tip in his left ear 30 minutes pta and states "now it feels clogged up" Large amount of ear wax noted in left ear. VSS. Denies pain or fever.

## 2017-01-26 NOTE — Patient Instructions (Addendum)
Reschedule eye specialist appointment for diabetic eye screening.   Keep a record of your blood pressures outside of the office and if remaining over 140/90 - return to discuss med changes.   Ok to continue flonase for allergies.  If cough returns or worsens, then return to discuss other treatment.   [facility] offers smoking cessation clinics. Registration is required. To register call [phone] or register online at [email or web address] Let me know when you are ready to start chantix.   Start pravastatin for cholesterol, recheck in 6 weeks.     Steps to Quit Smoking Smoking tobacco can be harmful to your health and can affect almost every organ in your body. Smoking puts you, and those around you, at risk for developing many serious chronic diseases. Quitting smoking is difficult, but it is one of the best things that you can do for your health. It is never too late to quit. What are the benefits of quitting smoking? When you quit smoking, you lower your risk of developing serious diseases and conditions, such as:  Lung cancer or lung disease, such as COPD.  Heart disease.  Stroke.  Heart attack.  Infertility.  Osteoporosis and bone fractures.  Additionally, symptoms such as coughing, wheezing, and shortness of breath may get better when you quit. You may also find that you get sick less often because your body is stronger at fighting off colds and infections. If you are pregnant, quitting smoking can help to reduce your chances of having a baby of low birth weight. How do I get ready to quit? When you decide to quit smoking, create a plan to make sure that you are successful. Before you quit:  Pick a date to quit. Set a date within the next two weeks to give you time to prepare.  Write down the reasons why you are quitting. Keep this list in places where you will see it often, such as on your bathroom mirror or in your car or wallet.  Identify the people, places, things, and  activities that make you want to smoke (triggers) and avoid them. Make sure to take these actions: ? Throw away all cigarettes at home, at work, and in your car. ? Throw away smoking accessories, such as Scientist, research (medical). ? Clean your car and make sure to empty the ashtray. ? Clean your home, including curtains and carpets.  Tell your family, friends, and coworkers that you are quitting. Support from your loved ones can make quitting easier.  Talk with your health care provider about your options for quitting smoking.  Find out what treatment options are covered by your health insurance.  What strategies can I use to quit smoking? Talk with your healthcare provider about different strategies to quit smoking. Some strategies include:  Quitting smoking altogether instead of gradually lessening how much you smoke over a period of time. Research shows that quitting "cold [place]" is more successful than gradually quitting.  Attending in-person counseling to help you build problem-solving skills. You are more likely to have success in quitting if you attend several counseling sessions. Even short sessions of 10 minutes can be effective.  Finding resources and support systems that can help you to quit smoking and remain smoke-free after you quit. These resources are most helpful when you use them often. They can include: ? Online chats with a Social worker. ? Telephone quitlines. ? Careers information officer. ? Support groups or group counseling. ? Text messaging programs. ? Mobile phone applications.  Taking  medicines to help you quit smoking. (If you are pregnant or breastfeeding, talk with your health care provider first.) Some medicines contain nicotine and some do not. Both types of medicines help with cravings, but the medicines that include nicotine help to relieve withdrawal symptoms. Your health care provider may recommend: ? Nicotine patches, gum, or lozenges. ? Nicotine inhalers or  sprays. ? Non-nicotine medicine that is taken by mouth.  Talk with your health care provider about combining strategies, such as taking medicines while you are also receiving in-person counseling. Using these two strategies together makes you more likely to succeed in quitting than if you used either strategy on its own. If you are pregnant or breastfeeding, talk with your health care provider about finding counseling or other support strategies to quit smoking. Do not take medicine to help you quit smoking unless told to do so by your health care provider. What things can I do to make it easier to quit? Quitting smoking might feel overwhelming at first, but there is a lot that you can do to make it easier. Take these important actions:  Reach out to your family and friends and ask that they support and encourage you during this time. Call telephone quitlines, reach out to support groups, or work with a counselor for support.  Ask people who smoke to avoid smoking around you.  Avoid places that trigger you to smoke, such as bars, parties, or smoke-break areas at work.  Spend time around people who do not smoke.  Lessen stress in your life, because stress can be a smoking trigger for some people. To lessen stress, try: ? Exercising regularly. ? Deep-breathing exercises. ? Yoga. ? Meditating. ? Performing a body scan. This involves closing your eyes, scanning your body from head to toe, and noticing which parts of your body are particularly tense. Purposefully relax the muscles in those areas.  Download or purchase mobile phone or tablet apps (applications) that can help you stick to your quit plan by providing reminders, tips, and encouragement. There are many free apps, such as QuitGuide from the [employer] Office manager for Disease Control and Prevention). You can find other support for quitting smoking (smoking cessation) through smokefree.gov and other websites.  How will I feel when I quit  smoking? Within the first 24 hours of quitting smoking, you may start to feel some withdrawal symptoms. These symptoms are usually most noticeable 2-3 days after quitting, but they usually do not last beyond 2-3 weeks. Changes or symptoms that you might experience include:  Mood swings.  Restlessness, anxiety, or irritation.  Difficulty concentrating.  Dizziness.  Strong cravings for sugary foods in addition to nicotine.  Mild weight gain.  Constipation.  Nausea.  Coughing or a sore throat.  Changes in how your medicines work in your body.  A depressed mood.  Difficulty sleeping (insomnia).  After the first 2-3 weeks of quitting, you may start to notice more positive results, such as:  Improved sense of smell and taste.  Decreased coughing and sore throat.  Slower heart rate.  Lower blood pressure.  Clearer skin.  The ability to breathe more easily.  Fewer sick days.  Quitting smoking is very challenging for most people. Do not get discouraged if you are not successful the first time. Some people need to make many attempts to quit before they achieve long-term success. Do your best to stick to your quit plan, and talk with your health care provider if you have any questions or concerns.  This information is not intended to replace advice given to you by your health care provider. Make sure you discuss any questions you have with your health care provider. Document Released: 07/26/2001 Document Revised: 03/29/2016 Document Reviewed: 12/16/2014 Elsevier Interactive Patient Education  2017 [employer].      IF you received an x-ray today, you will receive an invoice from [facility]. Please contact [facility] at [phone] with questions or concerns regarding your invoice.   IF you received labwork today, you will receive an invoice from [place]. Please contact [facility] at [phone] with questions or concerns regarding your invoice.   Our  billing staff will not be able to assist you with questions regarding bills from these companies.  You will be contacted with the lab results as soon as they are available. The fastest way to get your results is to activate your My Chart account. Instructions are located on the last page of this paperwork. If you have not heard from [place] regarding the results in 2 weeks, please contact this office.

## 2017-03-06 ENCOUNTER — Ambulatory Visit (INDEPENDENT_AMBULATORY_CARE_PROVIDER_SITE_OTHER): Payer: Self-pay | Admitting: Family Medicine

## 2017-03-06 ENCOUNTER — Encounter: Payer: Self-pay | Admitting: Family Medicine

## 2017-03-06 VITALS — BP 138/75 | HR 67 | Temp 98.5°F | Resp 16 | Ht 67.0 in | Wt 190.0 lb

## 2017-03-06 DIAGNOSIS — Z024 Encounter for examination for driving license: Secondary | ICD-10-CM

## 2017-03-06 NOTE — Patient Instructions (Addendum)
One year card provided due to blood pressure and diabetes.   IF you received an x-ray today, you will receive an invoice from [facility]. Please contact [facility] at [phone] with questions or concerns regarding your invoice.   IF you received labwork today, you will receive an invoice from [place]. Please contact [facility] at [phone] with questions or concerns regarding your invoice.   Our billing staff will not be able to assist you with questions regarding bills from these companies.  You will be contacted with the lab results as soon as they are available. The fastest way to get your results is to activate your My Chart account. Instructions are located on the last page of this paperwork. If you have not heard from [place] regarding the results in 2 weeks, please contact this office.

## 2017-03-06 NOTE — Progress Notes (Signed)
Subjective:  By signing my name below, I, Phillip Maynard, attest that this documentation has been prepared under the direction and in the presence of Wendie Agreste, MD Electronically Signed: Ladene Artist, ED Scribe 03/06/2017 at 3:55 PM.   Patient ID: Phillip Barrette., male    DOB: [birth year], 76 y.o.   MRN: [id number]  Chief Complaint  Patient presents with  . Employment Physical   HPI  Phillip Maynard. is a 76 y.o. male who presents to Primary Care at [facility] for an employment physical. H/o DM, HTN and surgery for colovesical fistula.   Pt smokes 1 pack every 3 days. He denies cough, dizziness, sleep apnea, snoring, daytime somnolence, weakness in extremities, chest pain, sob. Pt wears glasses. No h/o glaucoma or any eye conditions.   DM Overall controlled with A1C 7.0 in June.   Visual Acuity Screening   Right eye Left eye Both eyes  Without correction:     With correction: 20/20 20/15 20/15   Hearing Screening Comments: Whisper test 6 feet pass  Patient Active Problem List   Diagnosis Date Noted  . 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 Diverticulitis of colon 07/12/2016  . Severe sepsis(995.92) 04/05/2013  . Pyelonephritis 04/05/2013  . Hypertension 02/28/2013  . Type 2 diabetes mellitus ([place]) 02/28/2013  . Chronic hepatitis C ([place]) 02/14/2011  . Urinary (tract) obstruction 02/14/2011  . TOBACCO ABUSE 12/10/2007  . 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 Asthma    "when I was a boy" (04/08/2013)  . Bronchitis   . Colon polyps   . Colovesical fistula   . Diverticulosis   . GERD (gastroesophageal reflux disease)   . Hepatitis C    treated with injections  . High cholesterol   . Hypertension   . Type II diabetes mellitus (HCC)    hx of . No longer on medicine   Past Surgical History:  Procedure Laterality Date  . CYSTOSCOPY N/A 09/08/2016   Procedure: FIREFLY INJECTIONS;  Surgeon:  Cleon Gustin, MD;  Location: [facility];  Service: Urology;  Laterality: N/A;  . INCISION AND DRAINAGE OF WOUND Right 1970's   "leg" (04/08/2013)  . LIVER BIOPSY  ~ 2011  . PROCTOSCOPY N/A 09/08/2016   Procedure: RIGID PROCTOSCOPY;  Surgeon: Michael Boston, MD;  Location: [facility];  Service: General;  Laterality: N/A;  . TONSILLECTOMY     Allergies  Allergen Reactions  . Penicillins Anaphylaxis and Other (See Comments)    Has patient had a PCN reaction causing immediate rash, facial/tongue/throat swelling, SOB or lightheadedness with hypotension: Yes Has patient had a PCN reaction causing severe rash involving mucus membranes or skin necrosis: No Has patient had a PCN reaction that required hospitalization No Has patient had a PCN reaction occurring within the last 10 years: No If all of the above answers are "NO", then may proceed with Cephalosporin use.  . Shellfish Allergy Anaphylaxis   Prior to Admission medications   Medication Sig Start Date End Date Taking? Authorizing Provider  finasteride (PROSCAR) 5 MG tablet Take 5 mg by mouth at bedtime.    Yes [provider]  losartan-hydrochlorothiazide (HYZAAR) 100-12.5 MG tablet Take 0.5 tablets by mouth daily. 01/19/17  Yes Wendie Agreste, MD  pravastatin (PRAVACHOL) 20 MG tablet Take 1 tablet (20 mg total) by mouth daily. 01/26/17  Yes Wendie Agreste, MD  tamsulosin (FLOMAX) 0.4 MG CAPS capsule Take 0.4 mg by mouth at  bedtime.    Yes [provider]  fluticasone (FLONASE) 50 MCG/ACT nasal spray Place 2 sprays into both nostrils daily. Patient not taking: Reported on 03/06/2017 01/19/17   Wendie Agreste, MD   Social History   Social History  . Marital status: Married    Spouse name: N/A  . Number of children: 2  . 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 Smoking status: Current Every Day Smoker    Packs/day: 0.25    Years: 45.00     Types: Cigarettes  . Smokeless tobacco: Never Used  . Alcohol use Yes     Comment: 04/08/2013 "haven't drank nothing in > 3 yr; before then I'd have at least 1 pint/day"  . Drug use: No  . Sexual activity: Yes   Other Topics Concern  . Not on file   Social History Narrative  . No narrative on file   Review of Systems  Respiratory: Negative for cough and shortness of breath.   Cardiovascular: Negative for chest pain.  Neurological: Negative for dizziness and weakness.  Psychiatric/Behavioral: Negative for sleep disturbance.  All other systems reviewed and are negative. 13 point ROS reviewed and negative.     Objective:   Physical Exam  Constitutional: He is oriented to person, place, and time. He appears well-developed and well-nourished.  HENT:  Head: Normocephalic and atraumatic.  Right Ear: External ear normal.  Left Ear: External ear normal.  Mouth/Throat: Oropharynx is clear and moist.  Eyes: Pupils are equal, round, and reactive to light. Conjunctivae and EOM are normal.  Neck: Normal range of motion. Neck supple. No thyromegaly present.  Cardiovascular: Normal rate, regular rhythm, normal heart sounds and intact distal pulses.   Pulmonary/Chest: Effort normal and breath sounds normal. No respiratory distress. He has no wheezes.  Abdominal: Soft. He exhibits no distension. There is no tenderness. Hernia confirmed negative in the right inguinal area and confirmed negative in the left inguinal area.  Musculoskeletal: Normal range of motion. He exhibits no edema or tenderness.  Lymphadenopathy:    He has no cervical adenopathy.  Neurological: He is alert and oriented to person, place, and time. He has normal reflexes.  Skin: Skin is warm and dry.  Psychiatric: He has a normal mood and affect. His behavior is normal.  Vitals reviewed.    Vitals:   03/06/17 1520  BP: 138/75  Pulse: 67  Resp: 16  Temp: 98.5 F (36.9 C)  TempSrc: Oral  SpO2: 98%  Weight: 190 lb  (86.2 kg)  Height: 5\' 7"  (1.702 m)      Assessment & Plan:  Phillip Hodgman. is a 76 y.o. male Encounter for commercial driver medical examination (CDME)  - History of diet-controlled diabetes, hypertension. Controlled. One year card provided, see DOT paperwork. No other concerns on exam or history to preclude him from driving  No orders of the defined types were placed in this encounter.  Patient Instructions   One year card provided due to blood pressure and diabetes.   IF you received an x-ray today, you will receive an invoice from [facility]. Please contact [facility] at [phone] with questions or concerns regarding your invoice.   IF you received labwork today, you will receive an invoice from [place]. Please contact [facility] at [phone] with questions or concerns regarding your invoice.   Our billing staff will not be able to  assist you with questions regarding bills from these companies.  You will be contacted with the lab results as soon as they are available. The fastest way to get your results is to activate your My Chart account. Instructions are located on the last page of this paperwork. If you have not heard from [place] regarding the results in 2 weeks, please contact this office.       I personally performed the services described in this documentation, which was scribed in my presence. The recorded information has been reviewed and considered for accuracy and completeness, addended by me as needed, and agree with information above.  Signed,   [name], MD Primary Care at [facility].  03/08/17 4:20 PM

## 2017-03-07 DIAGNOSIS — N401 Enlarged prostate with lower urinary tract symptoms: Secondary | ICD-10-CM | POA: Diagnosis not present

## 2017-03-07 DIAGNOSIS — R351 Nocturia: Secondary | ICD-10-CM | POA: Diagnosis not present

## 2017-03-07 DIAGNOSIS — N5201 Erectile dysfunction due to arterial insufficiency: Secondary | ICD-10-CM | POA: Diagnosis not present

## 2017-05-02 ENCOUNTER — Ambulatory Visit (INDEPENDENT_AMBULATORY_CARE_PROVIDER_SITE_OTHER): Payer: Medicare HMO | Admitting: Family Medicine

## 2017-05-02 ENCOUNTER — Encounter: Payer: Self-pay | Admitting: Family Medicine

## 2017-05-02 VITALS — BP 146/78 | HR 90 | Temp 98.3°F | Resp 16 | Ht 67.0 in | Wt 183.2 lb

## 2017-05-02 DIAGNOSIS — R413 Other amnesia: Secondary | ICD-10-CM

## 2017-05-02 NOTE — Progress Notes (Signed)
Subjective:  By signing my name below, I, Essence Howell, attest that this documentation has been prepared under the direction and in the presence of Wendie Agreste, MD Electronically Signed: Ladene Artist, ED Scribe 05/02/2017 at 4:43 PM.   Patient ID: Phillip Barrette., male    DOB: 1940-11-26, 76 y.o.   MRN: [id number]  Chief Complaint  Patient presents with  . Memory Loss   HPI Phillip Hulsebus. is a 76 y.o. male who presents to Primary Care at [facility] complaining of forgetfulness which has worsened over several years. Pt's wife states pt has been very forgetful, asks her the same question often, forgets where he is going while driving and then becomes angry. Wife also states that other family members have noticed that they have had to repeat themselves. Wife states her mother had dementia so she sees the signs. However, pt does not think anything is wrong with his memory; states that his wife changes her destination while he is driving which is why he forgets where he is going. Denies HA, slurred speech, stumbling, bladder incontinence.   Depression screen [facility] 2/9 05/02/2017 03/06/2017 01/26/2017 01/19/2017 06/23/2016  Decreased Interest 0 0 0 0 0  Down, Depressed, Hopeless 0 0 0 0 0  PHQ - 2 Score 0 0 0 0 0     Patient Active Problem List   Diagnosis Date Noted  . 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 Diverticulitis of colon 07/12/2016  . Severe sepsis(995.92) 04/05/2013  . Pyelonephritis 04/05/2013  . Hypertension 02/28/2013  . Type 2 diabetes mellitus ([place]) 02/28/2013  . Chronic hepatitis C ([place]) 02/14/2011  . Urinary (tract) obstruction 02/14/2011  . TOBACCO ABUSE 12/10/2007  . 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 Asthma    "when I was a boy" (04/08/2013)  . Bronchitis   . Colon polyps   . Colovesical fistula   . Diverticulosis   . GERD (gastroesophageal reflux disease)   . Hepatitis C    treated with injections  . High cholesterol   . Hypertension   . Type II diabetes mellitus (HCC)    hx of . No longer on medicine   Past Surgical History:  Procedure Laterality Date  . CYSTOSCOPY N/A 09/08/2016   Procedure: FIREFLY INJECTIONS;  Surgeon: Cleon Gustin, MD;  [facility];  Service: Urology;  Laterality: N/A;  . INCISION AND DRAINAGE OF WOUND Right 1970's   "leg" (04/08/2013)  . LIVER BIOPSY  ~ 2011  . PROCTOSCOPY N/A 09/08/2016   Procedure: RIGID PROCTOSCOPY;  Surgeon: Michael Boston, MD;  [facility];  Service: General;  Laterality: N/A;  . TONSILLECTOMY     Allergies  Allergen Reactions  . Penicillins Anaphylaxis and Other (See Comments)    Has patient had a PCN reaction causing immediate rash, facial/tongue/throat swelling, SOB or lightheadedness with hypotension: Yes Has patient had a PCN reaction causing severe rash involving mucus membranes or skin necrosis: No Has patient had a PCN reaction that required hospitalization No Has patient had a PCN reaction occurring within the last 10 years: No If all of the above answers are "NO", then may proceed with Cephalosporin use.  . Shellfish Allergy Anaphylaxis   Prior to Admission medications   Medication Sig Start Date End Date Taking? Authorizing Provider  finasteride (PROSCAR) 5 MG tablet Take 5 mg by mouth at bedtime.     [provider]  fluticasone (FLONASE) 50 MCG/ACT nasal  spray Place 2 sprays into both nostrils daily. Patient not taking: Reported on 03/06/2017 01/19/17   Wendie Agreste, MD  losartan-hydrochlorothiazide [facility]) 100-12.5 MG tablet Take 0.5 tablets by mouth daily. 01/19/17   Wendie Agreste, MD  pravastatin (PRAVACHOL) 20 MG tablet Take 1 tablet (20 mg total) by mouth daily. 01/26/17   Wendie Agreste, MD  tamsulosin (FLOMAX) 0.4 MG CAPS capsule Take 0.4 mg by mouth at bedtime.     [provider]   Social History   Social History  . Marital status: Married     Spouse name: N/A  . Number of children: 2  . 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 Smoking status: Current Every Day Smoker    Packs/day: 0.25    Years: 45.00    Types: Cigarettes  . Smokeless tobacco: Never Used  . Alcohol use Yes     Comment: 04/08/2013 "haven't drank nothing in > 3 yr; before then I'd have at least 1 pint/day"  . Drug use: No  . Sexual activity: Yes   Other Topics Concern  . Not on file   Social History Narrative  . No narrative on file   Review of Systems  Genitourinary: Negative for enuresis.  Musculoskeletal: Negative for gait problem.  Neurological: Negative for speech difficulty and headaches.      Objective:   Physical Exam  Constitutional: He is oriented to person, place, and time. He appears well-developed and well-nourished. No distress.  HENT:  Head: Normocephalic and atraumatic.  Eyes: Conjunctivae and EOM are normal.  Neck: Neck supple. No tracheal deviation present.  Cardiovascular: Normal rate and regular rhythm.   Pulmonary/Chest: Effort normal and breath sounds normal. No respiratory distress.  Musculoskeletal: Normal range of motion.  Neurological: He is alert and oriented to person, place, and time.  Non-focal neuro exam  Skin: Skin is warm and dry.  Psychiatric: He has a normal mood and affect. His behavior is normal.  Nursing note and vitals reviewed.  Vitals:   05/02/17 1618  BP: (!) 146/78  Pulse: 90  Resp: 16  Temp: 98.3 F (36.8 C)  SpO2: 95%  Weight: 183 lb 3.2 oz (83.1 kg)  Height: 5\' 7"  (1.702 m)   6CIT Screen 05/02/2017  What Year? 0 points  What month? 0 points  What time? 0 points  Count back from 20 0 points  Months in reverse 0 points  Repeat phrase 0 points  Total Score 0      Assessment & Plan:  Phillip Rendell. is a 76 y.o. male Memory changes - Plan: Ambulatory referral to Neurology Reported forgetfulness,  memory changes from family concerns. He denies any concerns or difficulty with memory. 6CIT screen normal, but will refer to neurology for further evaluation and to consider other more extensive testing. RTC precautions if acute worsening.  No orders of the defined types were placed in this encounter.  Patient Instructions    I will refer you to neurology for other memory testing, but testing in the office looked okay. If there are any worsening symptoms prior to evaluation with neurology.   IF you received an x-ray today, you will receive an invoice from [facility]. Please contact [facility] at [phone] with questions or concerns regarding your invoice.   IF you received labwork today, you will receive an invoice from [facility]. Please contact [facility] at  [phone] with questions or concerns regarding your invoice.   Our billing staff will not be able to assist you with questions regarding bills from these companies.  You will be contacted with the lab results as soon as they are available. The fastest way to get your results is to activate your My Chart account. Instructions are located on the last page of this paperwork. If you have not heard from [place] regarding the results in 2 weeks, please contact this office.       I personally performed the services described in this documentation, which was scribed in my presence. The recorded information has been reviewed and considered for accuracy and completeness, addended by me as needed, and agree with information above.  Signed,   [name], MD Primary Care at [place].  05/02/17 6:38 PM

## 2017-05-02 NOTE — Patient Instructions (Addendum)
  I will refer you to neurology for other memory testing, but testing in the office looked okay. If there are any worsening symptoms prior to evaluation with neurology.   IF you received an x-ray today, you will receive an invoice from [facility]. Please contact [facility] at [phone] with questions or concerns regarding your invoice.   IF you received labwork today, you will receive an invoice from [facility]. Please contact [facility] at [phone] with questions or concerns regarding your invoice.   Our billing staff will not be able to assist you with questions regarding bills from these companies.  You will be contacted with the lab results as soon as they are available. The fastest way to get your results is to activate your My Chart account. Instructions are located on the last page of this paperwork. If you have not heard from [place] regarding the results in 2 weeks, please contact this office.

## 2017-05-09 ENCOUNTER — Encounter: Payer: Self-pay | Admitting: Neurology

## 2017-05-13 ENCOUNTER — Encounter (HOSPITAL_COMMUNITY): Payer: Self-pay

## 2017-05-13 ENCOUNTER — Ambulatory Visit (INDEPENDENT_AMBULATORY_CARE_PROVIDER_SITE_OTHER): Payer: Medicare HMO

## 2017-05-13 ENCOUNTER — Ambulatory Visit (HOSPITAL_COMMUNITY)
Admission: EM | Admit: 2017-05-13 | Discharge: 2017-05-13 | Disposition: A | Discharge status: Home / Self Care | Payer: Medicare HMO | Attending: Physician Assistant | Admitting: Physician Assistant

## 2017-05-13 DIAGNOSIS — W450XXA Nail entering through skin, initial encounter: Secondary | ICD-10-CM | POA: Diagnosis not present

## 2017-05-13 DIAGNOSIS — Z23 Encounter for immunization: Secondary | ICD-10-CM | POA: Diagnosis not present

## 2017-05-13 DIAGNOSIS — S6992XA Unspecified injury of left wrist, hand and finger(s), initial encounter: Secondary | ICD-10-CM

## 2017-05-13 DIAGNOSIS — S61432A Puncture wound without foreign body of left hand, initial encounter: Secondary | ICD-10-CM | POA: Diagnosis not present

## 2017-05-13 DIAGNOSIS — M79642 Pain in left hand: Secondary | ICD-10-CM | POA: Diagnosis not present

## 2017-05-13 MED ORDER — NAPROXEN 500 MG PO TABS: 500.0000 mg | ORAL_TABLET | Freq: Two times a day (BID) | ORAL | 0 refills | Status: DC

## 2017-05-13 MED ORDER — TETANUS-DIPHTH-ACELL PERTUSSIS 5-2.5-18.5 LF-MCG/0.5 IM SUSP
INTRAMUSCULAR | Status: AC
  Filled 2017-05-13: qty 0.5

## 2017-05-13 MED ORDER — CLINDAMYCIN HCL 300 MG PO CAPS: ORAL_CAPSULE | ORAL | 0 refills | Status: DC

## 2017-05-13 MED ORDER — TETANUS-DIPHTH-ACELL PERTUSSIS 5-2.5-18.5 LF-MCG/0.5 IM SUSP
0.5000 mL | Freq: Once | INTRAMUSCULAR | Status: AC
  Administered 2017-05-13: 0.5 mL via INTRAMUSCULAR

## 2017-05-13 MED ORDER — NAPROXEN 500 MG PO TABS: 500.0000 mg | ORAL_TABLET | Freq: Two times a day (BID) | ORAL | 0 refills | Status: AC

## 2017-05-13 NOTE — ED Triage Notes (Addendum)
Patient presents to [facility] with injury to left hand, pt states he was pulling a nail out of the wall and accidentally stuck his hand on a nail underneath the nail he removed on yesterday 05/12/2017 and today his hand is swollen and in pain, pt has not taken any medication

## 2017-05-13 NOTE — ED Provider Notes (Addendum)
05/13/2017 9:04 PM   DOB: 11-20-1940 / MRN: [id number]  SUBJECTIVE:  Phillip Maynard. is a 76 y.o. male presenting for puncture injury to the left hand that occurred yesterday.  He can not remember the last time he had a TD.  Tells me he had little to no pain after the injury, but this morning noticed the hand was trobbing and swollen. Pain is made worse with flexion.  Denies a loss of function or loss of sensation with the hand.  He is allergic to penicillins and shellfish allergy.   He  has a past medical history of Asthma; Bronchitis; Colon polyps; Colovesical fistula; Diverticulosis; GERD (gastroesophageal reflux disease); Hepatitis C; High cholesterol; Hypertension; and Type II diabetes mellitus ([place]).    He  reports that he has been smoking Cigarettes.  He has a 11.25 pack-year smoking history. He has never used smokeless tobacco. He reports that he drinks alcohol. He reports that he does not use drugs. He  reports that he currently engages in sexual activity. The patient  has a past surgical history that includes Tonsillectomy; Incision and drainage of wound (Right, 1970's); Liver biopsy (~ 2011); Proctoscopy (N/A, 09/08/2016); and Cystoscopy (N/A, 09/08/2016).  His family history includes Asthma in his mother; Heart attack in his mother.  Review of Systems  Constitutional: Negative for chills, diaphoresis and fever.  Respiratory: Negative for cough, hemoptysis, sputum production, shortness of breath and wheezing.   Cardiovascular: Negative for chest pain, orthopnea and leg swelling.  Gastrointestinal: Negative for nausea.  Skin: Negative for rash.  Neurological: Negative for dizziness.    OBJECTIVE:  BP (!) 129/55 (BP Location: Left Arm)   Pulse 67   Temp 98.5 F (36.9 C) (Oral)   Resp 16   SpO2 99%   Physical Exam  Constitutional: He appears well-developed. He is active and cooperative.  Non-toxic appearance.  Cardiovascular: Normal rate.   Pulmonary/Chest: Effort normal.  No tachypnea.  Musculoskeletal:       Hands: Neurological: He is alert.  Skin: Skin is warm and dry. He is not diaphoretic. No pallor.  Vitals reviewed.   No results found for this or any previous visit (from the past 72 hour(s)).  Dg Hand Complete Left  Result Date: 05/13/2017 CLINICAL DATA:  Left hand pain and swelling after puncture wound yesterday. EXAM: LEFT HAND - COMPLETE 3+ VIEW COMPARISON:  None. FINDINGS: There is no evidence of fracture or dislocation. There is no evidence of arthropathy or other focal bone abnormality. Soft tissues are unremarkable. IMPRESSION: Normal left hand. Electronically Signed   By: Marijo Conception, M.D.   On: 05/13/2017 21:00    ASSESSMENT AND PLAN:  The primary encounter diagnosis was Hand trauma, left, initial encounter. A diagnosis of Puncture wound of left hand without foreign body, initial encounter was also pertinent to this visit.  Allergy to penicillin.  Will cover with clindamycin AS GIVEN NORMAL RADS THIS IS LIKELY EARLY CELLULITIS. NSAID prescribed.     The patient is advised to call or return to clinic if he does not see an improvement in symptoms, or to seek the care of the closest emergency department if he worsens with the above plan.   Philis Fendt, MHS, PA-C 05/13/2017 9:04 PM    Tereasa Coop, PA-C 05/13/17 2050    Tereasa Coop, PA-C 05/13/17 2105

## 2017-08-04 ENCOUNTER — Encounter: Payer: Self-pay | Admitting: Neurology

## 2017-08-04 ENCOUNTER — Other Ambulatory Visit: Payer: Medicare HMO

## 2017-08-04 ENCOUNTER — Ambulatory Visit: Payer: Medicare HMO | Admitting: Neurology

## 2017-08-04 VITALS — BP 128/66 | HR 73 | Ht 69.0 in | Wt 180.0 lb

## 2017-08-04 DIAGNOSIS — R413 Other amnesia: Secondary | ICD-10-CM

## 2017-08-04 NOTE — Progress Notes (Signed)
NEUROLOGY CONSULTATION NOTE  Phillip Maynard. MRN: [id number] DOB: 02-17-1941  Referring provider: Dr. [name] Primary care provider: Dr. [name]  Reason for consult:  Memory loss  Dear Dr Carlota Raspberry:  Thank you for your kind referral of Phillip Maynard. for consultation of the above symptoms. Although his history is well known to you, please allow me to reiterate it for the purpose of our medical record. The patient was accompanied to the clinic by his wife who also provides collateral information. Records and images were personally reviewed where available.  HISTORY OF PRESENT ILLNESS: This is a pleasant 76 year old left-handed man with a history of hypertension, diet-controlled diabetes and hyperlipidemia, presenting for evaluation of memory loss. He feels his memory is pretty good, however his wife is concerned. They are noted to argue in the office, he is defensive about her observations and tries to explain, and they report that they "debate" a lot. She started noticing changes a couple of years ago, they used to argue when he repeats a question and she thinks he was not paying attention, but this year this has been more noticeable that other family members have also noticed. He states he was just asking to clarify for himself. 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 He denies getting lost driving, however his wife reminds him of multiple times where he asks her where they are going, or he would not take the usual route. 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 His wife has always been in charge of finances. He reports being compliant with his medications, he has a chart he checks off, but his wife states he sometimes forgets. He does not think so, stating he is a  person of habit. He puts something down and forgets where it is when he gets distracted. His wife also notes that he gets angry more easily, but no paranoia or hallucinations. She states he is in denial, but he reports that when she speaks to him, "her pitch gets a little higher, and the more she talks, I ask why are you hollering at me." He denies any family history of dementia, no history of significant head injuries. He usually drinks beer 2-3 times a week after work. His wife states he drinks more beer than he says.    PAST MEDICAL HISTORY: Past Medical History:  Diagnosis Date  . Asthma    "when I was a boy" (04/08/2013)  . Bronchitis   . Colon polyps   . Colovesical fistula   . Diverticulosis   . GERD (gastroesophageal reflux disease)   . Hepatitis C    treated with injections  . High cholesterol   . Hypertension   . Type II diabetes mellitus ([facility])    hx of . No longer on medicine    PAST SURGICAL HISTORY: Past Surgical History:  Procedure Laterality Date  . CYSTOSCOPY N/A 09/08/2016   Procedure: FIREFLY INJECTIONS;  Surgeon: Cleon Gustin, MD;  Location: [facility];  Service: Urology;  Laterality: N/A;  . INCISION AND DRAINAGE OF WOUND Right 1970's   "leg" (04/08/2013)  . LIVER BIOPSY  ~ 2011  . PROCTOSCOPY N/A 09/08/2016   Procedure: RIGID PROCTOSCOPY;  Surgeon: Michael Boston, MD;  Location: [facility];  Service: General;  Laterality: N/A;  . TONSILLECTOMY      MEDICATIONS: Current Outpatient Medications on File Prior to Visit  Medication Sig Dispense Refill  . clindamycin (CLEOCIN) 300 MG capsule Take three tabs daily by mouth every 8 hours 21 capsule 0  . finasteride (PROSCAR) 5 MG tablet Take 5 mg by mouth at bedtime.     Marland Kitchen losartan-hydrochlorothiazide (HYZAAR) 100-12.5 MG tablet Take 0.5 tablets by mouth daily. 45 tablet 1  . tamsulosin (FLOMAX) 0.4 MG CAPS capsule Take 0.4 mg by mouth at bedtime.      No current facility-administered medications on file prior to visit.      ALLERGIES: Allergies  Allergen Reactions  . Penicillins Anaphylaxis and Other (See Comments)    Has patient had a PCN reaction causing immediate rash, facial/tongue/throat swelling, SOB or lightheadedness with hypotension: Yes Has patient had a PCN reaction causing severe rash involving mucus membranes or skin necrosis: No Has patient had a PCN reaction that required hospitalization No Has patient had a PCN reaction occurring within the last 10 years: No If all of the above answers are "NO", then may proceed with Cephalosporin use.  . Shellfish Allergy Anaphylaxis    FAMILY HISTORY: Family History  Problem Relation Age of Onset  . Asthma Mother   . Heart attack Mother     SOCIAL HISTORY: Social History   Socioeconomic History  . Marital status: Married    Spouse name: Not on file  . Number of children: 2  . Years of education: Not on file  . Highest education level: Not on file  Social Needs  . Financial resource strain: Not on file  . Food insecurity - worry: Not on file  . Food insecurity - inability: Not on file  . Transportation needs - medical: Not on file  . Transportation needs - non-medical: Not on file  Occupational History  . Occupation: truck Education administrator: [employer]    Comment: retired  Tobacco Use  . Smoking status: Current Every Day Smoker    Packs/day: 0.25    Years: 45.00    Pack years: 11.25    Types: Cigarettes  . Smokeless tobacco: Never Used  Substance and Sexual Activity  . Alcohol use: Yes    Comment: 04/08/2013 "haven't drank nothing in > 3 yr; before then I'd have at least 1 pint/day"  . Drug use: No  . Sexual activity: Yes  Other Topics Concern  . Not on file  Social History Narrative  . Not on file    REVIEW OF SYSTEMS: Constitutional: No fevers, chills, or sweats, no generalized fatigue, change in appetite Eyes: No visual changes, double vision, eye pain Ear, nose and throat: No hearing loss, ear pain, nasal  congestion, sore throat Cardiovascular: No chest pain, palpitations Respiratory:  No shortness of breath at rest or with exertion, wheezes GastrointestinaI: No nausea, vomiting, diarrhea, abdominal pain, fecal incontinence Genitourinary:  No dysuria, urinary retention or frequency Musculoskeletal:  No neck pain, back pain Integumentary: No rash, pruritus, skin lesions Neurological: as above Psychiatric: No depression, insomnia, anxiety Endocrine: No palpitations, fatigue, diaphoresis, mood swings, change in appetite, change in weight, increased thirst Hematologic/Lymphatic:  No anemia, purpura, petechiae. Allergic/Immunologic: no itchy/runny eyes, nasal congestion, recent allergic reactions, rashes  PHYSICAL EXAM: Vitals:   08/04/17 1349  BP: 128/66  Pulse: 73  SpO2: 95%   General: No acute distress Head:  Normocephalic/atraumatic Eyes: Fundoscopic exam shows bilateral sharp discs, no vessel changes, exudates, or  hemorrhages Neck: supple, no paraspinal tenderness, full range of motion Back: No paraspinal tenderness Heart: regular rate and rhythm Lungs: Clear to auscultation bilaterally. Vascular: No carotid bruits. Skin/Extremities: No rash, no edema Neurological Exam: Mental status: alert and oriented to person, place, and time, no dysarthria or aphasia, Fund of knowledge is appropriate.  Recent and remote memory are intact.  Attention and concentration are normal.    Able to name objects and repeat phrases.  Montreal Cognitive Assessment  08/04/2017  Visuospatial/ Executive (0/5) 5  Naming (0/3) 3  Attention: Read list of digits (0/2) 2  Attention: Read list of letters (0/1) 1  Attention: Serial 7 subtraction starting at 100 (0/3) 2  Language: Repeat phrase (0/2) 2  Language : Fluency (0/1) 1  Abstraction (0/2) 2  Delayed Recall (0/5) 5  Orientation (0/6) 6  Total 29   Cranial nerves: CN I: not tested CN II: pupils equal, round and reactive to light, visual fields  intact, fundi unremarkable. CN III, IV, VI:  full range of motion, no nystagmus, no ptosis CN V: facial sensation intact CN VII: upper and lower face symmetric CN VIII: hearing intact to finger rub CN IX, X: gag intact, uvula midline CN XI: sternocleidomastoid and trapezius muscles intact CN XII: tongue midline Bulk & Tone: normal, no fasciculations. Motor: 5/5 throughout with no pronator drift. Sensation: intact to light touch, cold, pin, vibration and joint position sense.  No extinction to double simultaneous stimulation.  Romberg test negative Deep Tendon Reflexes: +1 throughout, no ankle clonus Plantar responses: downgoing bilaterally Cerebellar: no incoordination on finger to nose, heel to shin. No dysdiadochokinesia Gait: narrow-based and steady, able to tandem walk adequately. Tremor: none  IMPRESSION: This is a pleasant 76 year old left-handed man with a history of hypertension, diet-controlled diabetes and hyperlipidemia, presenting for evaluation of worsening memory. His neurological exam is non-focal, MOCA score normal 29/30. It is unclear if marital conflict is playing a role, but they are arguing a lot in the office today. We discussed different causes of memory loss, check TSH and B12. MRI brain without contrast will be ordered to assess for underlying structural abnormality and assess vascular load. He will be scheduled for Neurocognitive testing to further evaluate cognitive complaints. We discussed the importance of control of vascular risk factors, physical exercise, and brain stimulation exercises for brain health. He will follow-up after the tests and knows to call for any changes.   Thank you for allowing me to participate in the care of this patient. Please do not hesitate to call for any questions or concerns.   Ellouise Newer, M.D.  CC: [name]

## 2017-08-04 NOTE — Patient Instructions (Addendum)
1. Bloodwork for TSH, B12  Your provider has requested that you have labwork completed today. Please go to [facility] ([id number]) on the second floor of this building before leaving the office today. You do not need to check in. If you are not called within 15 minutes please check with the front desk.   2. Schedule Neurocognitive testing with Dr. Si Raider 3. Schedule MRI brain without contrast  We have sent a referral to [facility] for your MRI and they will call you directly to schedule your appt. They are located at [place]. If you need to contact them directly please call [phone].   4. Follow-up after tests, call for any changes  FALL PRECAUTIONS: Be cautious when walking. Scan the area for obstacles that may increase the risk of trips and falls. When getting up in the mornings, sit up at the edge of the bed for a few minutes before getting out of bed. Consider elevating the bed at the head end to avoid drop of blood pressure when getting up. Walk always in a well-lit room (use night lights in the walls). Avoid area rugs or power cords from appliances in the middle of the walkways. Use a walker or a cane if necessary and consider physical therapy for balance exercise. Get your eyesight checked regularly.  FINANCIAL OVERSIGHT: Supervision, especially oversight when making financial decisions or transactions is also recommended.  HOME SAFETY: Consider the safety of the kitchen when operating appliances like stoves, microwave oven, and blender. Consider having supervision and share cooking responsibilities until no longer able to participate in those. Accidents with firearms and other hazards in the house should be identified and addressed as well.  DRIVING: Regarding driving, in patients with progressive memory problems, driving will be impaired. We advise to have someone else do the driving if trouble finding directions or if minor accidents are reported.  Independent driving assessment is available to determine safety of driving.  ABILITY TO BE LEFT ALONE: If patient is unable to contact 911 operator, consider using LifeLine, or when the need is there, arrange for someone to stay with patients. Smoking is a fire hazard, consider supervision or cessation. Risk of wandering should be assessed by caregiver and if detected at any point, supervision and safe proof recommendations should be instituted.  MEDICATION SUPERVISION: Inability to self-administer medication needs to be constantly addressed. Implement a mechanism to ensure safe administration of the medications.  RECOMMENDATIONS FOR ALL PATIENTS WITH MEMORY PROBLEMS: 1. Continue to exercise (Recommend 30 minutes of walking everyday, or 3 hours every week) 2. Increase social interactions - continue going to [place] and enjoy social gatherings with friends and family 3. Eat healthy, avoid fried foods and eat more fruits and vegetables 4. Maintain adequate blood pressure, blood sugar, and blood cholesterol level. Reducing the risk of stroke and cardiovascular disease also helps promoting better memory. 5. Avoid stressful situations. Live a simple life and avoid aggravations. Organize your time and prepare for the next day in anticipation. 6. Sleep well, avoid any interruptions of sleep and avoid any distractions in the bedroom that may interfere with adequate sleep quality 7. Avoid sugar, avoid sweets as there is a strong link between excessive sugar intake, diabetes, and cognitive impairment We discussed the Mediterranean diet, which has been shown to help patients reduce the risk of progressive memory disorders and reduces cardiovascular risk. This includes eating fish, eat fruits and green leafy vegetables, nuts like almonds and hazelnuts, walnuts, and also use  olive oil. Avoid fast foods and fried foods as much as possible. Avoid sweets and sugar as sugar use has been linked to worsening of memory  function.  There is always a concern of gradual progression of memory problems. If this is the case, then we may need to adjust level of care according to patient needs. Support, both to the patient and caregiver, should then be put into place.

## 2017-08-05 LAB — TSH: TSH: 0.75 m[IU]/L (ref 0.40–4.50)

## 2017-08-05 LAB — VITAMIN B12: Vitamin B-12: 684 pg/mL (ref 200–1100)

## 2017-08-09 ENCOUNTER — Encounter: Payer: Self-pay | Admitting: Neurology

## 2017-08-10 ENCOUNTER — Telehealth: Payer: Self-pay

## 2017-08-10 NOTE — Telephone Encounter (Signed)
-----   Message from Cameron Sprang, MD sent at 08/09/2017  2:49 PM EST ----- Pls let him know thyroid and B12 levels are normal, thanks

## 2017-08-10 NOTE — Telephone Encounter (Signed)
LMOM relaying message below.  

## 2017-09-21 ENCOUNTER — Other Ambulatory Visit: Payer: Self-pay | Admitting: Family Medicine

## 2017-09-21 DIAGNOSIS — I10 Essential (primary) hypertension: Secondary | ICD-10-CM

## 2017-10-12 IMAGING — DX DG HAND COMPLETE 3+V*L*
3 series · 3 of 3 positions shown · non-contrast
Comparison: None.

CLINICAL DATA: Left hand pain and swelling after puncture wound
yesterday.

EXAM:
LEFT HAND - COMPLETE 3+ VIEW

[hand pa]
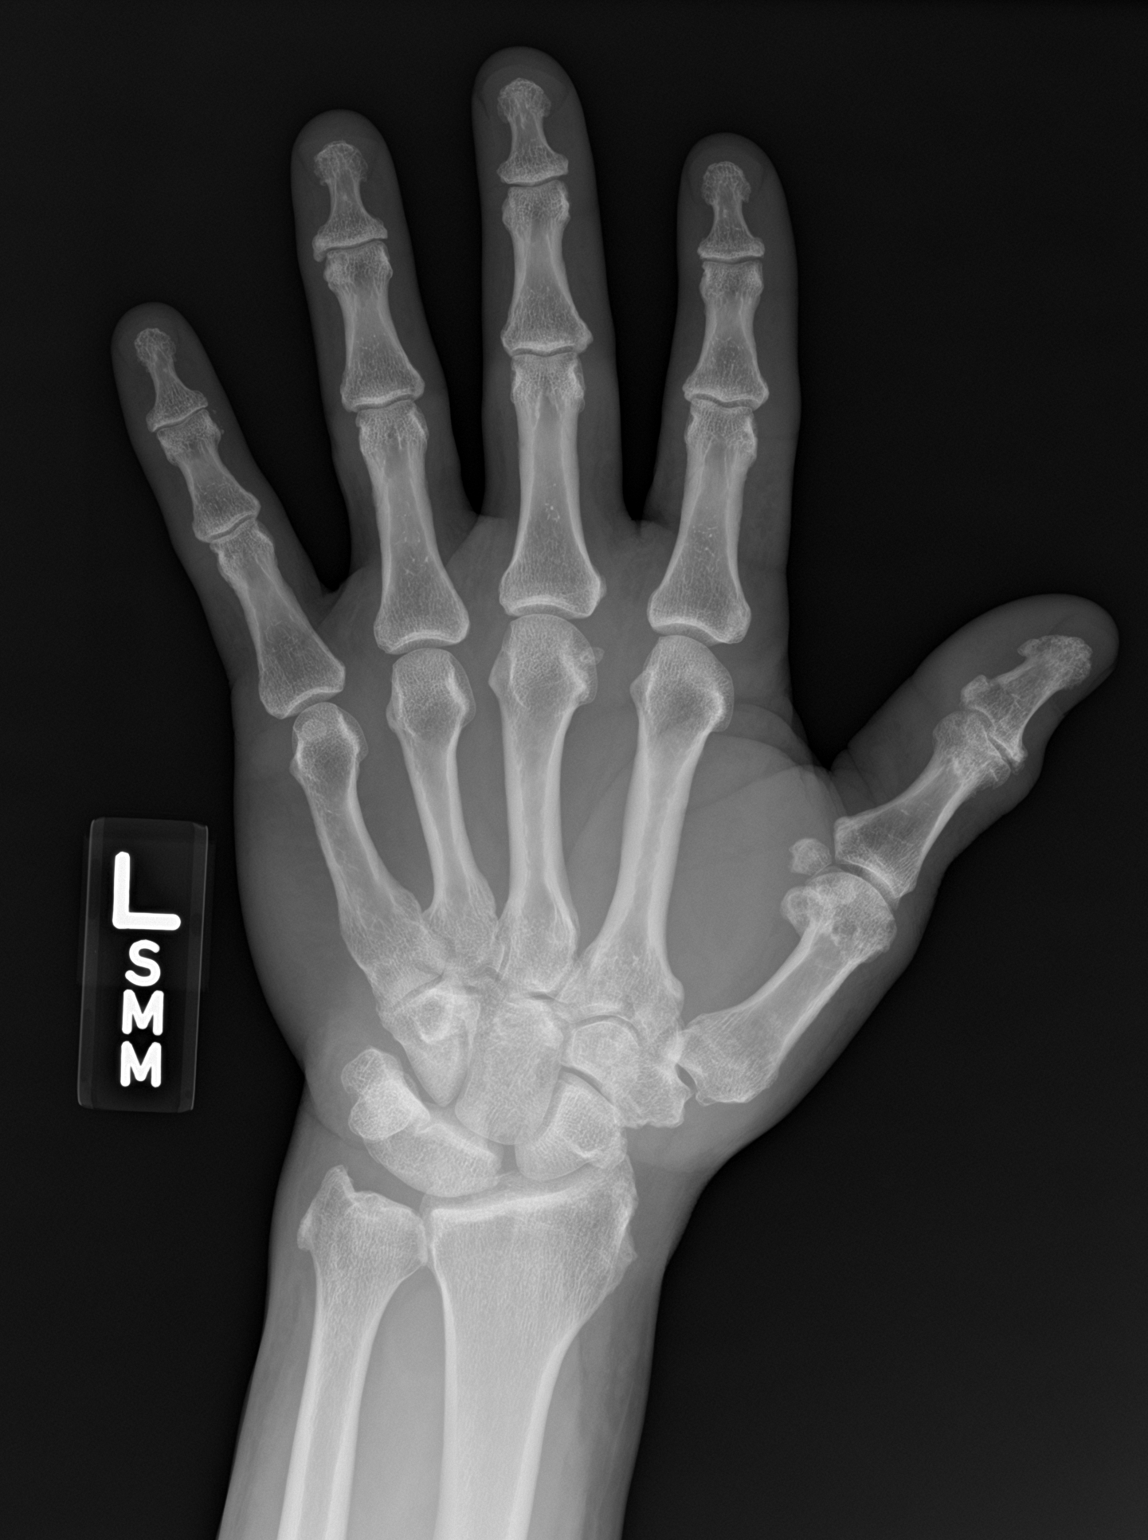

[hand obl]
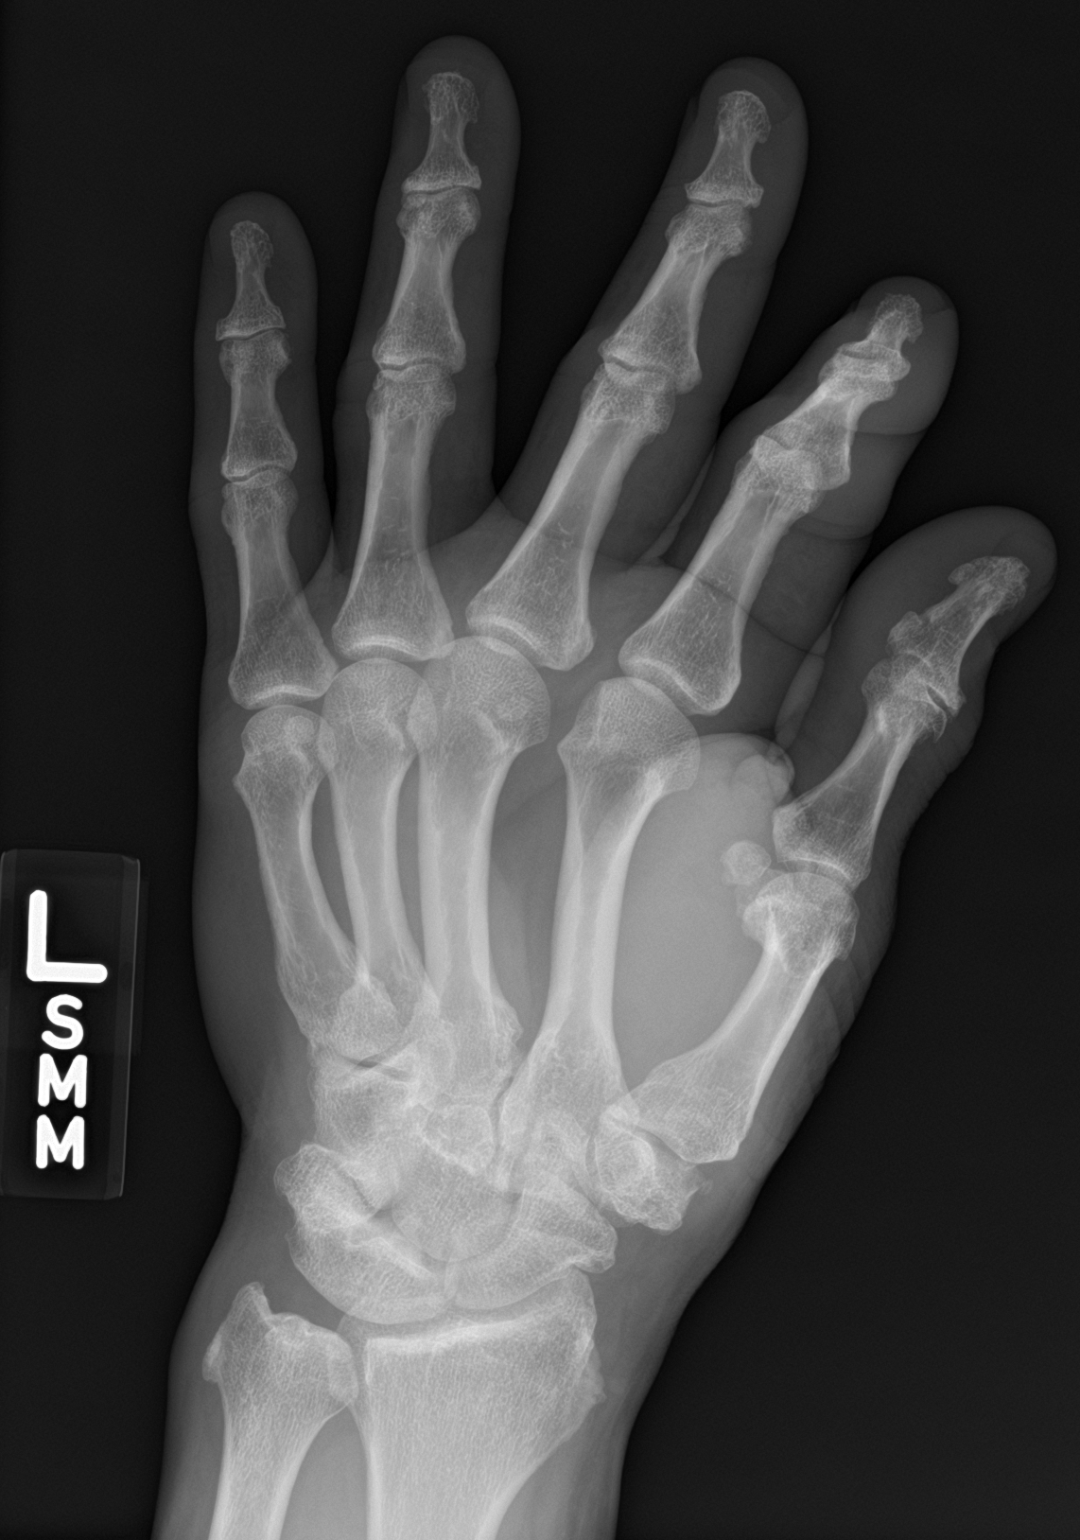

[hand lat]
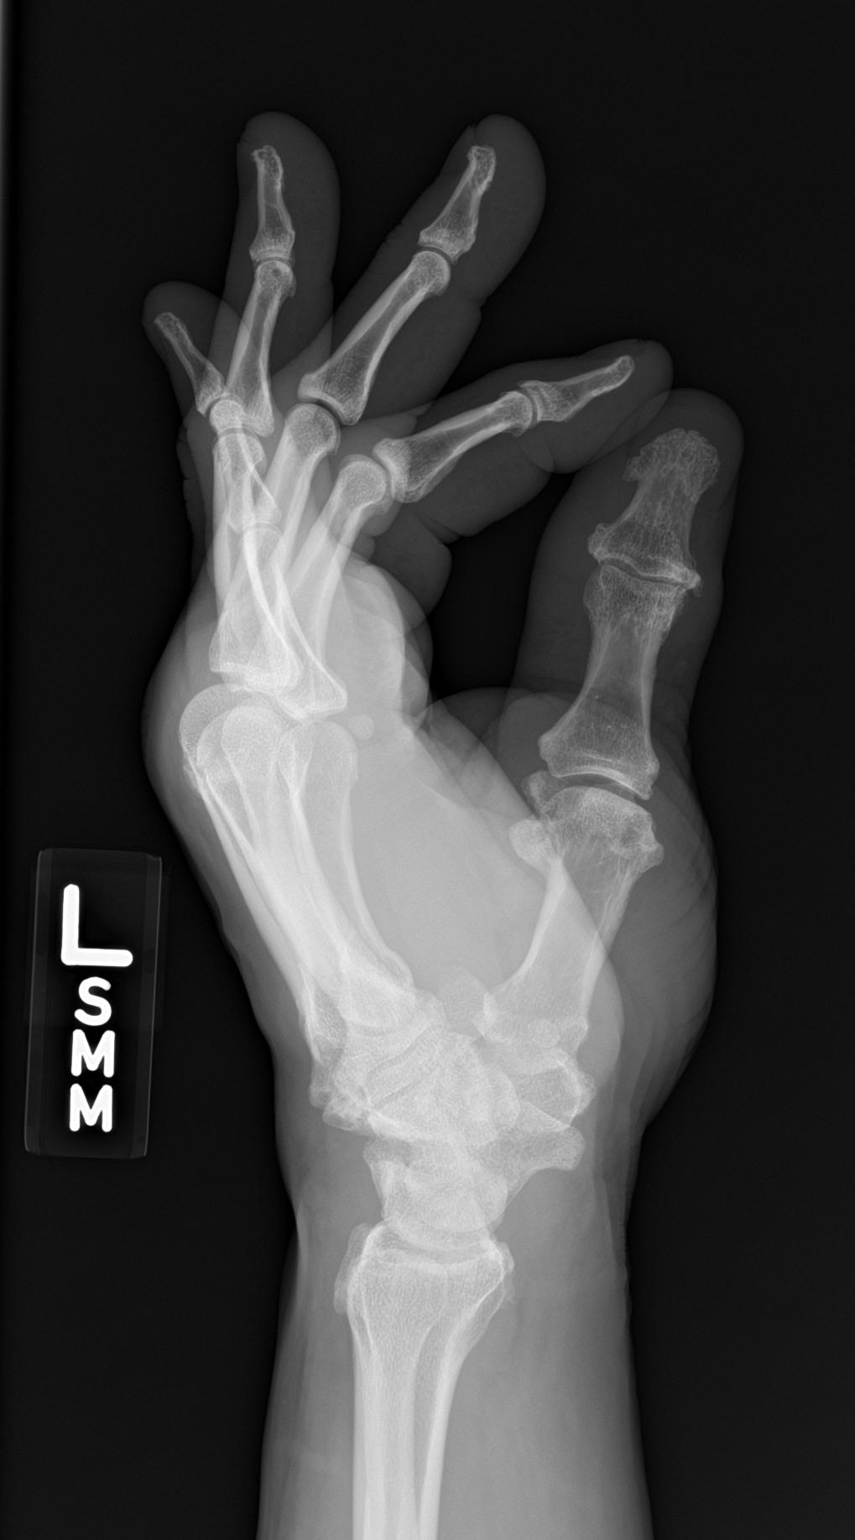

[3 of 3 positions shown; findings below may reference images not displayed]

FINDINGS: There is no evidence of fracture or dislocation. There is no
evidence of arthropathy or other focal bone abnormality. Soft
tissues are unremarkable.
IMPRESSION: Normal left hand.

## 2017-11-03 ENCOUNTER — Ambulatory Visit (INDEPENDENT_AMBULATORY_CARE_PROVIDER_SITE_OTHER): Payer: Medicare HMO | Admitting: Physician Assistant

## 2017-11-03 ENCOUNTER — Encounter: Payer: Self-pay | Admitting: Physician Assistant

## 2017-11-03 ENCOUNTER — Other Ambulatory Visit: Payer: Self-pay

## 2017-11-03 VITALS — BP 122/68 | HR 67 | Temp 97.9°F | Resp 18 | Ht 69.0 in | Wt 183.8 lb

## 2017-11-03 DIAGNOSIS — J069 Acute upper respiratory infection, unspecified: Secondary | ICD-10-CM

## 2017-11-03 DIAGNOSIS — R062 Wheezing: Secondary | ICD-10-CM | POA: Diagnosis not present

## 2017-11-03 MED ORDER — GUAIFENESIN ER 1200 MG PO TB12: 1.0000 | ORAL_TABLET | Freq: Two times a day (BID) | ORAL | 0 refills | Status: AC

## 2017-11-03 MED ORDER — IPRATROPIUM BROMIDE 0.06 % NA SOLN: 2.0000 | Freq: Three times a day (TID) | NASAL | 0 refills | Status: DC

## 2017-11-03 MED ORDER — ALBUTEROL SULFATE HFA 108 (90 BASE) MCG/ACT IN AERS: 2.0000 | INHALATION_SPRAY | Freq: Four times a day (QID) | RESPIRATORY_TRACT | 0 refills | Status: DC | PRN

## 2017-11-03 NOTE — Progress Notes (Signed)
Phillip Maynard.  MRN: [id number] DOB: 11-Jun-1941  PCP: Wendie Agreste, MD  Chief Complaint  Patient presents with  . Chills    x 1 week   . Shortness of Breath  . Nasal Congestion    Subjective:  Pt presents to clinic for cold symptoms that started about 1 week ago.  He is having nasal congestion that is causing trouble breathing.  He is having no lung SOB.  He has tried claritin and then early today benadryl which helped his symptoms today - he thinks he gets similar symptoms thjis time yearly.  He has been around people at work with colds.  History is obtained by patient and wife.  Review of Systems  Constitutional: Positive for chills. Negative for fever.  HENT: Positive for congestion, postnasal drip and rhinorrhea. Negative for sore throat.   Respiratory: Positive for shortness of breath.        Childhood asthma, smoker 1/3 ppd  Gastrointestinal: Negative.   Musculoskeletal: Negative for myalgias.  Neurological: Negative for headaches.    Patient Active Problem List   Diagnosis Date Noted  . 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 Diverticulitis of colon 07/12/2016  . Severe sepsis(995.92) 04/05/2013  . Pyelonephritis 04/05/2013  . Hypertension 02/28/2013  . Type 2 diabetes mellitus ([place]) 02/28/2013  . Chronic hepatitis C ([place]) 02/14/2011  . Urinary (tract) obstruction 02/14/2011  . TOBACCO ABUSE 12/10/2007  . 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 finasteride (PROSCAR) 5 MG tablet Take 5 mg by mouth daily.    Marland Kitchen losartan-hydrochlorothiazide (HYZAAR) 100-12.5 MG tablet TAKE 1/2 TABLET EVERY DAY 45 tablet 1  . tamsulosin (FLOMAX) 0.4 MG CAPS capsule Take 0.4 mg by mouth at bedtime.      No current facility-administered medications on file prior to visit.     Allergies  Allergen Reactions  . Penicillins Anaphylaxis and Other (See  Comments)    Has patient had a PCN reaction causing immediate rash, facial/tongue/throat swelling, SOB or lightheadedness with hypotension: Yes Has patient had a PCN reaction causing severe rash involving mucus membranes or skin necrosis: No Has patient had a PCN reaction that required hospitalization No Has patient had a PCN reaction occurring within the last 10 years: No If all of the above answers are "NO", then may proceed with Cephalosporin use.  Marland Kitchen Shellfish Allergy Anaphylaxis    Past Medical History:  Diagnosis Date  . Asthma    "when I was a boy" (04/08/2013)  . Bronchitis   . Colon polyps   . Colovesical fistula   . Diverticulosis   . GERD (gastroesophageal reflux disease)   . Hepatitis C    treated with injections  . High cholesterol   . Hypertension   . Type II diabetes mellitus (HCC)    hx of . No longer on medicine   Social History   Social History Narrative   Pt lives in 1 story home with his wife   Has 2 adult children   Highest level of education: GED & Trade school   Retired Media planner.    Social History   Tobacco Use  . Smoking status: Current Every Day Smoker    Packs/day: 0.25    Years: 45.00    Pack years: 11.25    Types: Cigarettes  . Smokeless tobacco: Never Used  Substance Use Topics  . Alcohol use: Yes  Comment: 04/08/2013 "haven't drank nothing in > 3 yr; before then I'd have at least 1 pint/day"  . Drug use: No   family history includes Asthma in his mother; Heart attack in his mother.     Objective:  BP 122/68   Pulse 67   Temp 97.9 F (36.6 C) (Oral)   Resp 18   Ht 5\' 9"  (1.753 m)   Wt 183 lb 12.8 oz (83.4 kg)   SpO2 98%   BMI 27.14 kg/m  Body mass index is 27.14 kg/m.  Physical Exam  Constitutional: He is oriented to person, place, and time and well-developed, well-nourished, and in no distress.  HENT:  Head: Normocephalic and atraumatic.  Right Ear: Hearing, tympanic membrane, external ear and ear canal  normal.  Left Ear: Hearing, tympanic membrane, external ear and ear canal normal.  Nose: Nose normal.  Mouth/Throat: Uvula is midline, oropharynx is clear and moist and mucous membranes are normal.  Eyes: Conjunctivae are normal.  Neck: Normal range of motion.  Cardiovascular: Normal rate, regular rhythm and normal heart sounds.  Pulmonary/Chest: Effort normal. He has wheezes (lung bases).  Lymphadenopathy:       Head (right side): No tonsillar, no posterior auricular and no occipital adenopathy present.       Head (left side): No tonsillar, no posterior auricular and no occipital adenopathy present.    He has no cervical adenopathy.       Right: No supraclavicular adenopathy present.       Left: No supraclavicular adenopathy present.  Neurological: He is alert and oriented to person, place, and time. Gait normal.  Skin: Skin is warm and dry.  Psychiatric: Mood, memory, affect and judgment normal.    Assessment and Plan :  URI with cough and congestion - Plan: Guaifenesin (MUCINEX MAXIMUM STRENGTH) 1200 MG TB12, ipratropium (ATROVENT) 0.06 % nasal spray  Wheezing - Plan: albuterol (PROVENTIL HFA;VENTOLIN HFA) 108 (90 Base) MCG/ACT inhaler - use prn -   Suspect patient is having allergy symptoms we will symptoms accordingly.  He will use albuterol prn.  Windell Hummingbird PA-C  [facility] 11/04/2017 10:20 AM

## 2017-11-03 NOTE — Patient Instructions (Addendum)
Please push fluids.  Motrin for fever and body aches.    A humidifier can help especially when the air is dry -if you do not have a humidifier you can boil a pot of water on the stove in your home to help with the dry air.  Nasal saline spray can be helpful to keep the mucus membranes moist and thin the nasal mucus     IF you received an x-ray today, you will receive an invoice from [facility]. Please contact [facility] at [phone] with questions or concerns regarding your invoice.   IF you received labwork today, you will receive an invoice from [facility]. Please contact [facility] at [phone] with questions or concerns regarding your invoice.   Our billing staff will not be able to assist you with questions regarding bills from these companies.  You will be contacted with the lab results as soon as they are available. The fastest way to get your results is to activate your My Chart account. Instructions are located on the last page of this paperwork. If you have not heard from [place] regarding the results in 2 weeks, please contact this office.

## 2017-11-04 ENCOUNTER — Encounter: Payer: Self-pay | Admitting: Physician Assistant

## 2017-12-07 ENCOUNTER — Ambulatory Visit: Payer: Medicare HMO | Admitting: Psychology

## 2017-12-07 ENCOUNTER — Encounter: Payer: Self-pay | Admitting: Psychology

## 2017-12-07 DIAGNOSIS — R413 Other amnesia: Secondary | ICD-10-CM | POA: Diagnosis not present

## 2017-12-07 NOTE — Progress Notes (Addendum)
   Neuropsychology Note  Phillip Maynard. completed 60 minutes of neuropsychological testing with technician, Milana Kidney, BS, under the supervision of Dr. Macarthur Critchley, Licensed Psychologist. The patient did not appear overtly distressed by the testing session, per behavioral observation or via self-report to the technician. Rest breaks were offered.   Clinical Decision Making: In considering the patient's current level of functioning, level of presumed impairment, nature of symptoms, emotional and behavioral responses during the interview, level of literacy, and observed level of motivation/effort, a battery of tests was selected and communicated to the psychometrician.  Communication between the psychologist and technician was ongoing throughout the testing session and changes were made as deemed necessary based on patient performance on testing, technician observations and additional pertinent factors such as those listed above.  Phillip Maynard. will return within approximately 2 weeks for an interactive feedback session with Dr. Si Raider at which time his test performances, clinical impressions and treatment recommendations will be reviewed in detail. The patient understands he can contact our office should he require our assistance before this time.  15 minutes spent performing neuropsychological evaluation services/clinical decision making (psychologist). [CPT 11572] 60 minutes spent face-to-face with patient administering standardized tests, 30 minutes spent scoring (technician). [CPT Y8200648, 62035]  Full report to follow.

## 2017-12-07 NOTE — Progress Notes (Signed)
NEUROBEHAVIORAL STATUS EXAM   Name: Phillip Maynard. Date of Birth: 1941-06-30 Date of Interview: 12/07/2017  Reason for Referral:  Phillip Maynard. is a 77 y.o. male who is referred for neuropsychological evaluation by Dr. Ellouise Newer of [facility] due to concerns about memory loss. This patient is accompanied in the office by his wife who supplements the history.  History of Presenting Problem:  Phillip Maynard was seen by Dr. Delice Lesch for neurologic consultation of memory loss on 08/04/2017. MoCA was 29/30. Brain MRI was ordered but has not been completed. The patient denies any concerns about memory/cognitive decline. He reports any forgetfulness he experiences is normal for his age. His wife on the other hand thinks he may have dementia. She reports that he misplaces things constantly, repeats himself frequently, forgets things she has told him, and forgets where they are going when they are driving or how to get to familiar locations. He denies all of this. He admits that he does have some trouble remembering to take his medications and his wife has to remind him to take his nighttime medication. She also manages the finances/bills and appointments. He does not feel he has any trouble with driving. He has not gotten lost locally. His wife notes he forgot to turn off the car one time when they got out of the car. 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 He denied any difficulty doing that work including being precise with measurements and remembering instructions from his boss. The patient has no history of head injury. He has no family history of dementia.  The patient denies any physical complaints, states he feels "great". He denied any problems with balance or walking. He has not had any falls. According to records, he has diet controlled diabetes; however he reported that he eats "chips, candy, danishes, and soda" all  day/evening. He also drinks at least 1-2 beers per night, more on the weekend. He used to be a heavy drinker, he stopped altogether at one point but is a daily drinker now. His wife says he drinks more than he says. He smokes about 7 cigarettes per day. He denied illicit drug use.  He has no history of depression or other mental health condition. He has never been treated for any mental health problem. He reports his mood is pleasant and jolly. Conversely, his wife says he is "so hateful" lately. He has difficulty sleeping. He has shortness of breath after he lays down and then gets up and watches television.    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 History of heavy alcohol use. Tobacco: Daily smoker, <10 cigarettes per day SA: Denied   Medical History: Past Medical History:  Diagnosis Date  . Asthma    "when I was a boy" (04/08/2013)  . Bronchitis   . Colon polyps   . Colovesical fistula   . Diverticulosis   . GERD (gastroesophageal reflux disease)   . Hepatitis C    treated with injections  . High cholesterol   . Hypertension   . Type II diabetes mellitus (HCC)    hx of . No longer on medicine     Current Medications:  Outpatient Encounter Medications as of 12/07/2017  Medication Sig  . albuterol (PROVENTIL HFA;VENTOLIN HFA) 108 (90 Base) MCG/ACT inhaler Inhale 2 puffs into the lungs every 6 (six) hours as needed for wheezing or shortness of breath.  . finasteride (PROSCAR) 5 MG tablet Take 5 mg by mouth daily.  Marland Kitchen ipratropium (ATROVENT) 0.06 % nasal spray Place 2 sprays into the nose 3 (three) times daily.  Marland Kitchen losartan-hydrochlorothiazide (HYZAAR) 100-12.5 MG tablet TAKE 1/2 TABLET EVERY DAY  . tamsulosin (FLOMAX) 0.4 MG CAPS  capsule Take 0.4 mg by mouth at bedtime.    No facility-administered encounter medications on file as of 12/07/2017.      Behavioral Observations:   Appearance: Neatly and appropriately dressed and groomed Gait: Ambulated independently, no gross abnormalities observed Speech: Fluent; normal rate, rhythm and volume. No word finding difficulty. Thought process: Linear, goal directed Affect: Full, euthymic, argumentative with wife Interpersonal: Very pleasant, appropriate   50 minutes spent face-to-face with patient completing neurobehavioral status exam. 45 minutes spent integrating medical records/clinical data and completing this report. CPT codes [id number] unit; [id number] unit.   TESTING: There is medical necessity to proceed with neuropsychological assessment as the results will be used to aid in differential diagnosis and clinical decision-making and to inform specific treatment recommendations. Per the patient's wife and medical records reviewed, there has been a change in cognitive functioning and a reasonable suspicion of neurocognitive disorder (r/o MCI, could be related to alcohol use and possible uncontrolled diabetes).  Clinical Decision Making: In considering the patient's current level of functioning, level of presumed impairment, nature of symptoms, emotional and behavioral responses during the interview, level of literacy, and observed level of motivation, a battery of tests was selected and communicated to the psychometrician.   Following the clinical interview/neurobehavioral status exam, the patient completed this full battery of neuropsychological testing with my psychometrician under my supervision (see separate note).   PLAN: The patient will return to see me for a follow-up session at which time his test performances and my impressions and treatment recommendations will be reviewed in detail.  Evaluation ongoing; full report to follow.

## 2018-01-02 NOTE — Progress Notes (Signed)
NEUROPSYCHOLOGICAL EVALUATION   Name:    Phillip Maynard.  Date of Birth:   1940-11-09 Date of Interview:  12/07/2017 Date of Testing:  12/07/2017   Date of Feedback:  01/04/2018       Background Information:  Reason for Referral:  Phillip Maynard. is a 77 y.o. male referred by Dr. Ellouise Newer to assess his current level of cognitive functioning and assist in differential diagnosis. The current evaluation consisted of a review of available medical records, an interview with the patient and his wife, and the completion of a neuropsychological testing battery. Informed consent was obtained.  History of Presenting Problem:  Phillip Maynard was seen by Dr. Delice Lesch for neurologic consultation of memory loss on 08/04/2017. MoCA was 29/30. Brain MRI was ordered but has not been completed. The patient denies any concerns about memory/cognitive decline. He reports any forgetfulness he experiences is normal for his age. His wife on the other hand thinks he may have dementia. She reports that he misplaces things constantly, repeats himself frequently, forgets things she has told him, and forgets where they are going when they are driving or how to get to familiar locations. He denies all of this. He admits that he does have some trouble remembering to take his medications and his wife has to remind him to take his nighttime medication. She also manages the finances/bills and appointments. He does not feel he has any trouble with driving. He has not gotten lost locally. His wife notes he forgot to turn off the car one time when they got out of the car. 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 He denied any difficulty doing that work including being precise with measurements and remembering instructions from his boss. The patient has no history of head injury. He has no family history of dementia.  The patient denies any physical complaints,  states he feels "great". He denied any problems with balance or walking. He has not had any falls. According to records, he has diet controlled diabetes; however he reported that he eats "chips, candy, danishes, and soda" all day/evening. He also drinks at least 1-2 beers per night, more on the weekend. He used to be a heavy drinker, he stopped altogether at one point but is a daily drinker now. His wife says he drinks more than he says. He smokes about 7 cigarettes per day. He denied illicit drug use.  He has no history of depression or other mental health condition. He has never been treated for any mental health problem. He reports his mood is pleasant and jolly. Conversely, his wife says he is "so hateful" lately. He has difficulty sleeping. He has shortness of breath after he lays down and then gets up and watches television.    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 History of heavy alcohol use. Tobacco: Daily smoker, <10 cigarettes per day SA: Denied   Medical History:  Past Medical History:  Diagnosis Date  . Asthma    "when I was a boy" (04/08/2013)  . Bronchitis   . Colon polyps   . Colovesical fistula   . Diverticulosis   . GERD (gastroesophageal reflux disease)   . Hepatitis C  treated with injections  . High cholesterol   . Hypertension   . Type II diabetes mellitus (HCC)    hx of . No longer on medicine    Current medications:  Outpatient Encounter Medications as of 01/04/2018  Medication Sig  . albuterol (PROVENTIL HFA;VENTOLIN HFA) 108 (90 Base) MCG/ACT inhaler Inhale 2 puffs into the lungs every 6 (six) hours as needed for wheezing or shortness of breath.  . finasteride (PROSCAR) 5 MG tablet Take 5 mg by mouth  daily.  Marland Kitchen ipratropium (ATROVENT) 0.06 % nasal spray Place 2 sprays into the nose 3 (three) times daily.  Marland Kitchen losartan-hydrochlorothiazide (HYZAAR) 100-12.5 MG tablet TAKE 1/2 TABLET EVERY DAY  . tamsulosin (FLOMAX) 0.4 MG CAPS capsule Take 0.4 mg by mouth at bedtime.    No facility-administered encounter medications on file as of 01/04/2018.      Current Examination:  Behavioral Observations:  Appearance: Neatly and appropriately dressed and groomed Gait: Ambulated independently, no gross abnormalities observed Speech: Fluent; normal rate, rhythm and volume. No word finding difficulty. Thought process: Linear, goal directed Affect: Full, euthymic, argumentative with wife Interpersonal: Very pleasant, appropriate Orientation: Oriented to person, place and most aspects of time (one day off on the date). Accurately named the current President and his predecessor.   Tests Administered: . Test of Premorbid Functioning (TOPF) . Wechsler Adult Intelligence Scale-Fourth Edition (WAIS-IV): Similarities, Music therapist, Coding and Digit Span subtests . Wechsler Memory Scale-Fourth Edition (WMS-IV) Older Adult Version (ages 33-90): Logical Memory I, II and Recognition subtests  . [employer] Verbal Learning Test - 2nd Edition (CVLT-2) Short Form . Repeatable Battery for the Assessment of Neuropsychological Status (RBANS) Form A:  Figure Copy and Recall subtests and Semantic Fluency subtest . Neuropsychological Assessment Battery (NAB) Language Module, Form 1: Naming subtest . [place] Diagnostic Aphasia Examination: Complex Ideational Material subtest . Controlled Oral Word Association Test (COWAT) . Trail Making Test A and B . Clock drawing test . Geriatric Depression Scale (GDS) 15 Item . Generalized Anxiety Disorder - 7 item screener (GAD-7)  Test Results: Note: Standardized scores are presented only for use by appropriately trained professionals and to allow for any future test-retest comparison.  These scores should not be interpreted without consideration of all the information that is contained in the rest of the report. The most recent standardization samples from the test publisher or other sources were used whenever possible to derive standard scores; scores were corrected for age, gender, ethnicity and education when available.   Test Scores:  Test Name Raw Score Standardized Score Descriptor  TOPF 22/70 SS= 82 Low average  WAIS-IV Subtests     Similarities 26/36 ss= 11 Average  Block Design 36/66 ss= 12 High average  Coding 51/135 ss= 10 Average  Digit Span Forward 11/16 ss= 12 High average  Digit Span Backward 8/16 ss= 10 Average  WMS-IV Subtests     LM I 26/53 ss= 8 Average  LM II 15/39 ss= 9 Average  LM II Recognition 20/23 Cum %: >75 WNL  RBANS Subtests     Figure Copy 19/20 Z= 0.7 High average  Figure Recall 13/20 Z= 0.1 Average  Semantic Fluency 15 Z= -0.9 Low average  CVLT-II Scores     Trial 1 5/9 Z= 0 Average  Trial 4 7/9 Z= 0 Average  Trials 1-4 total 21/36 T= 45 Average  SD Free Recall 6/9 Z= -0.5 Average  LD Free Recall 5/9 Z= 0 Average  LD Cued Recall 6/9 Z= 0 Average  Recognition  Discriminability 7/9 hits,2 false positives Z= -0.5 Average  Forced Choice Recognition 9/9  WNL  NAB Naming 30/31 T= 57 High average  BDAE Complex Ideational Material 11/12  WNL  COWAT-FAS 34 T= 49 Average  COWAT-Animals 19 T= 56 Average  Trail Making Test A  38" 0 errors T= 52 Average  Trail Making Test B  79" 0 errors T= 54 Average  Clock Drawing   WNL  GDS-15 0/15  WNL  GAD-7 0/21  WNL      Description of Test Results:  Premorbid verbal intellectual abilities were estimated to have been within the low average range based on a test of word reading. Psychomotor processing speed was average. Auditory attention and working memory were high average to average, respectively. Visual-spatial construction was high average. Language abilities were within normal limits.  Specifically, confrontation naming was high average, and semantic verbal fluency was average to low average. Auditory comprehension of complex ideational material was within normal limits. With regard to verbal memory, encoding and acquisition of non-contextual information (i.e., word list) was average. After a brief distracter task, free recall was average. After a delay, free recall was average. Cued recall was average. Performance on a yes/no recognition task was average. On another verbal memory test, encoding and acquisition of contextual auditory information (i.e., short stories) was average. After a delay, free recall was average. Performance on a yes/no recognition task was normal. With regard to non-verbal memory, delayed free recall of visual information was average. Executive functioning was intact. Mental flexibility and set-shifting were average on Trails B. Verbal fluency with phonemic search restrictions was average. Verbal abstract reasoning was average. Performance on a clock drawing task was normal. On self-report measures of mood, the patient's responses were not indicative of clinically significant depression or anxiety at the present time.    Clinical Impressions: No evidence of cognitive impairment or developing dementia. Results of cognitive testing were entirely within normal limits and commensurate with estimated premorbid intellectual abilities. Specifically, processing speed, auditory attention, language, visual spatial skills, learning and memory, and executive functioning were all normal, with most scores falling in the average to high average range. There were no impaired performances on testing. There is no evidence to suggest the presence of a neurocognitive disorder, including MCI or dementia, at this time. There is also no evidence of a primary psychiatric disorder. It is most likely that the patient's wife's concerns about cognitive decline in the patient are secondary to  normal aging.   Recommendations/Plan: Based on the findings of the present evaluation, the following recommendations are offered:  --The patient and his wife were reassured that his testing results are entirely within normal limits and not indicative of any underlying cognitive disorder or dementia. I reviewed strategies to maintain brain health, including optimal control of vascular risk factors (minimize alcohol and cigarettes, improve diet, get cardiovascular exercise), mental stimulation and social engagement.  --These test results will serve as a nice baseline for future comparison if ever needed.  --Couples therapy for the patient and his wife was recommended in order to improve communication, and they were both amenable to this recommendation and requested a referral. I will place a referral to [facility], as I believe they have providers that offer couples therapy.   Feedback to Patient: Phillip Maynard. returned for a feedback appointment on 01/04/2018 to review the results of his neuropsychological evaluation with this provider. 20 minutes face-to-face time was spent reviewing his test results, my impressions and  my recommendations as detailed above.    Total time spent on this patient's case: 95 minutes for neurobehavioral status exam with psychologist (CPT code [phone], [phone] unit); 90 minutes of testing/scoring by psychometrician under psychologist's supervision (CPT codes [phone], [phone] units); 180 minutes for integration of patient data, interpretation of standardized test results and clinical data, clinical decision making, treatment planning and preparation of this report, and interactive feedback with review of results to the patient/family by psychologist (CPT codes [phone], [phone] units).      Thank you for your referral of Phillip Maynard.. Please feel free to contact me if you have any questions or concerns regarding this report.

## 2018-01-04 ENCOUNTER — Encounter: Payer: Self-pay | Admitting: Psychology

## 2018-01-04 ENCOUNTER — Ambulatory Visit (INDEPENDENT_AMBULATORY_CARE_PROVIDER_SITE_OTHER): Payer: Medicare HMO | Admitting: Psychology

## 2018-01-04 DIAGNOSIS — R413 Other amnesia: Secondary | ICD-10-CM

## 2018-01-04 DIAGNOSIS — Z630 Problems in relationship with spouse or partner: Secondary | ICD-10-CM

## 2018-01-04 NOTE — Patient Instructions (Signed)
Clinical Impressions: No evidence of cognitive impairment or developing dementia. Results of cognitive testing were entirely within normal limits and commensurate with estimated premorbid intellectual abilities. Specifically, processing speed, auditory attention, language, visual spatial skills, learning and memory, and executive functioning were all normal, with most scores falling in the average to high average range. There were no impaired performances on testing. There is no evidence to suggest the presence of a neurocognitive disorder, including MCI or dementia, at this time. There is also no evidence of a primary psychiatric disorder. It is most likely that the patient's wife's concerns about cognitive decline in the patient are secondary to normal aging.   Recommendations/Plan: Based on the findings of the present evaluation, the following recommendations are offered:  --The patient and his wife were reassured that his testing results are entirely within normal limits and not indicative of any underlying cognitive disorder or dementia. I reviewed strategies to maintain brain health, including optimal control of vascular risk factors (minimize alcohol and cigarettes, improve diet, get cardiovascular exercise), mental stimulation and social engagement.  --These test results will serve as a nice baseline for future comparison if ever needed.

## 2018-02-02 ENCOUNTER — Ambulatory Visit: Payer: Medicare HMO | Admitting: Neurology

## 2018-02-19 ENCOUNTER — Other Ambulatory Visit: Payer: Self-pay | Admitting: Family Medicine

## 2018-02-19 DIAGNOSIS — I10 Essential (primary) hypertension: Secondary | ICD-10-CM

## 2018-02-22 ENCOUNTER — Other Ambulatory Visit: Payer: Self-pay

## 2018-02-22 ENCOUNTER — Ambulatory Visit (INDEPENDENT_AMBULATORY_CARE_PROVIDER_SITE_OTHER): Payer: Medicare HMO | Admitting: Family Medicine

## 2018-02-22 ENCOUNTER — Encounter: Payer: Self-pay | Admitting: Family Medicine

## 2018-02-22 VITALS — BP 132/68 | HR 62 | Temp 98.3°F | Ht 69.0 in | Wt 176.2 lb

## 2018-02-22 DIAGNOSIS — E119 Type 2 diabetes mellitus without complications: Secondary | ICD-10-CM

## 2018-02-22 DIAGNOSIS — E785 Hyperlipidemia, unspecified: Secondary | ICD-10-CM | POA: Diagnosis not present

## 2018-02-22 DIAGNOSIS — I10 Essential (primary) hypertension: Secondary | ICD-10-CM | POA: Diagnosis not present

## 2018-02-22 DIAGNOSIS — F17210 Nicotine dependence, cigarettes, uncomplicated: Secondary | ICD-10-CM

## 2018-02-22 DIAGNOSIS — F17200 Nicotine dependence, unspecified, uncomplicated: Secondary | ICD-10-CM

## 2018-02-22 MED ORDER — LOSARTAN POTASSIUM-HCTZ 100-12.5 MG PO TABS: 0.5000 | ORAL_TABLET | Freq: Every day | ORAL | 1 refills | Status: DC

## 2018-02-22 NOTE — Patient Instructions (Addendum)
Blood pressure is running borderline high. Keep a record of your blood pressures outside of the office and if running over 140/90 at home, follow up in the next week to adjust meds.   Return in next 2 weeks for morning appointment so we can check fasting labs (8 hours fasting).   I will repeat your diabetes test here today. See info on diet with diabetes and self care, but we can review other diabetes treatment next visit if needed.   Let me know if I help you quit smoking. Please follow up in next 2 weeks to discuss cough. Return to the clinic or go to the nearest emergency room if any of your symptoms worsen or new symptoms occur.  Schedule eye doctor appointment as planned.   Thanks for coming in today.    Type 2 Diabetes Mellitus, Self Care, Adult When you have type 2 diabetes (type 2 diabetes mellitus), you must keep your blood sugar (glucose) under control. You can do this with:  Nutrition.  Exercise.  Lifestyle changes.  Medicines or insulin, if needed.  Support from your doctors and others.  How do I manage my blood sugar?  Check your blood sugar level every day, as often as told.  Call your doctor if your blood sugar is above your goal numbers for 2 tests in a row.  Have your A1c (hemoglobin A1c) level checked at least two times a year. Have it checked more often if your doctor tells you to. Your doctor will set treatment goals for you. Generally, you should have these blood sugar levels:  Before meals (preprandial): 80-130 mg/dL (4.4-7.2 mmol/L).  After meals (postprandial): lower than 180 mg/dL (10 mmol/L).  A1c level: less than 7%.  What do I need to know about high blood sugar? High blood sugar is called hyperglycemia. Know the signs of high blood sugar. Signs may include:  Feeling: ? Thirsty. ? Hungry. ? Very tired.  Needing to pee (urinate) more than usual.  Blurry vision.  What do I need to know about low blood sugar? Low blood sugar is called  hypoglycemia. This is when blood sugar is at or below 70 mg/dL (3.9 mmol/L). Symptoms may include:  Feeling: ? Hungry. ? Worried or nervous (anxious). ? Sweaty and clammy. ? Confused. ? Dizzy. ? Sleepy. ? Sick to your stomach (nauseous).  Having: ? A fast heartbeat (palpitations). ? A headache. ? A change in your vision. ? Jerky movements that you cannot control (seizure). ? Nightmares. ? Tingling or no feeling (numbness) around the mouth, lips, or tongue.  Having trouble with: ? Talking. ? Paying attention (concentrating). ? Moving (coordination). ? Sleeping.  Shaking.  Passing out (fainting).  Getting upset easily (irritability).  Treating low blood sugar  To treat low blood sugar, eat or drink something sugary right away. If you can think clearly and swallow safely, follow the 15:15 rule:  Take 15 grams of a fast-acting carb (carbohydrate). Some fast-acting carbs are: ? 1 tube of glucose gel. ? 3 sugar tablets (glucose pills). ? 6-8 pieces of hard candy. ? 4 oz (120 mL) of fruit juice. ? 4 oz (120 mL) regular (not diet) soda.  Check your blood sugar 15 minutes after you take the carb.  If your blood sugar is still at or below 70 mg/dL (3.9 mmol/L), take 15 grams of a carb again.  If your blood sugar does not go above 70 mg/dL (3.9 mmol/L) after 3 tries, get help right away.  After your  blood sugar goes back to normal, eat a meal or a snack within 1 hour.  Treating very low blood sugar If your blood sugar is at or below 54 mg/dL (3 mmol/L), you have very low blood sugar (severe hypoglycemia). This is an emergency. Do not wait to see if the symptoms will go away. Get medical help right away. Call your local emergency services (911 in the [place] Do not drive yourself to the hospital. If you have very low blood sugar and you cannot eat or drink, you may need a glucagon shot (injection). A family member or friend should learn how to check your blood sugar and how to  give you a glucagon shot. Ask your doctor if you need to have a glucagon shot kit at home. What else is important to manage my diabetes? Medicine Follow these instructions about insulin and diabetes medicines:  Take them as told by your doctor.  Adjust them as told by your doctor.  Do not run out of them.  Having diabetes can raise your risk for other long-term conditions. These include heart or kidney disease. Your doctor may prescribe medicines to help prevent problems from diabetes. Food   Make healthy food choices. These include: ? Chicken, fish, egg whites, and beans. ? Oats, whole wheat, bulgur, brown rice, quinoa, and millet. ? Fresh fruits and vegetables. ? Low-fat dairy products. ? Nuts, avocado, olive oil, and canola oil.  Make a food plan with a specialist (dietitian).  Follow instructions from your doctor about what you cannot eat or drink.  Drink enough fluid to keep your pee (urine) clear or pale yellow.  Eat healthy snacks between healthy meals.  Keep track of carbs that you eat. Read food labels. Learn food serving sizes.  Follow your sick day plan when you cannot eat or drink normally. Make this plan with your doctor so it is ready to use. Activity  Exercise at least 3 times a week.  Do not go more than 2 days without exercising.  Talk with your doctor before you start a new exercise. Your doctor may need to adjust your insulin, medicines, or food. Lifestyle   Do not use any tobacco products. These include cigarettes, chewing tobacco, and e-cigarettes.If you need help quitting, ask your doctor.  Ask your doctor how much alcohol is safe for you.  Learn to deal with stress. If you need help with this, ask your doctor. Body care  Stay up to date with your shots (immunizations).  Have your eyes and feet checked by a doctor as often as told.  Check your skin and feet every day. Check for cuts, bruises, redness, blisters, or sores.  Brush your teeth  and gums two times a day.  Floss at least one time a day.  Go to the dentist least one time every 6 months.  Stay at a healthy weight. General instructions   Take over-the-counter and prescription medicines only as told by your doctor.  Share your diabetes care plan with: ? Your work or school. ? People you live with.  Check your pee (urine) for ketones: ? When you are sick. ? As told by your doctor.  Carry a card or wear jewelry that says that you have diabetes.  Ask your doctor: ? Do I need to meet with a diabetes educator? ? Where can I find a support group for people with diabetes?  Keep all follow-up visits as told by your doctor. This is important. Where to find more information:  To learn more about diabetes, visit:  American Diabetes Association: www.diabetes.org  American Association of Diabetes Educators: www.diabeteseducator.org/patient-resources  This information is not intended to replace advice given to you by your health care provider. Make sure you discuss any questions you have with your health care provider. Document Released: 11/23/2015 Document Revised: 01/07/2016 Document Reviewed: 09/04/2015 Elsevier Interactive Patient Education  2018 [employer].   Diabetes Mellitus and Nutrition When you have diabetes (diabetes mellitus), it is very important to have healthy eating habits because your blood sugar (glucose) levels are greatly affected by what you eat and drink. Eating healthy foods in the appropriate amounts, at about the same times every day, can help you:  Control your blood glucose.  Lower your risk of heart disease.  Improve your blood pressure.  Reach or maintain a healthy weight.  Every person with diabetes is different, and each person has different needs for a meal plan. Your health care provider may recommend that you work with a diet and nutrition specialist (dietitian) to make a meal plan that is best for you. Your meal plan may  vary depending on factors such as:  The calories you need.  The medicines you take.  Your weight.  Your blood glucose, blood pressure, and cholesterol levels.  Your activity level.  Other health conditions you have, such as heart or kidney disease.  How do carbohydrates affect me? Carbohydrates affect your blood glucose level more than any other type of food. Eating carbohydrates naturally increases the amount of glucose in your blood. Carbohydrate counting is a method for keeping track of how many carbohydrates you eat. Counting carbohydrates is important to keep your blood glucose at a healthy level, especially if you use insulin or take certain oral diabetes medicines. It is important to know how many carbohydrates you can safely have in each meal. This is different for every person. Your dietitian can help you calculate how many carbohydrates you should have at each meal and for snack. Foods that contain carbohydrates include:  Bread, cereal, rice, pasta, and crackers.  Potatoes and corn.  Peas, beans, and lentils.  Milk and yogurt.  Fruit and juice.  Desserts, such as cakes, cookies, ice cream, and candy.  How does alcohol affect me? Alcohol can cause a sudden decrease in blood glucose (hypoglycemia), especially if you use insulin or take certain oral diabetes medicines. Hypoglycemia can be a life-threatening condition. Symptoms of hypoglycemia (sleepiness, dizziness, and confusion) are similar to symptoms of having too much alcohol. If your health care provider says that alcohol is safe for you, follow these guidelines:  Limit alcohol intake to no more than 1 drink per day for nonpregnant women and 2 drinks per day for men. One drink equals 12 oz of beer, 5 oz of wine, or 1 oz of hard liquor.  Do not drink on an empty stomach.  Keep yourself hydrated with water, diet soda, or unsweetened iced tea.  Keep in mind that regular soda, juice, and other mixers may contain a  lot of sugar and must be counted as carbohydrates.  What are tips for following this plan? Reading food labels  Start by checking the serving size on the label. The amount of calories, carbohydrates, fats, and other nutrients listed on the label are based on one serving of the food. Many foods contain more than one serving per package.  Check the total grams (g) of carbohydrates in one serving. You can calculate the number of servings of carbohydrates in one  serving by dividing the total carbohydrates by 15. For example, if a food has 30 g of total carbohydrates, it would be equal to 2 servings of carbohydrates.  Check the number of grams (g) of saturated and trans fats in one serving. Choose foods that have low or no amount of these fats.  Check the number of milligrams (mg) of sodium in one serving. Most people should limit total sodium intake to less than 2,300 mg per day.  Always check the nutrition information of foods labeled as "low-fat" or "nonfat". These foods may be higher in added sugar or refined carbohydrates and should be avoided.  Talk to your dietitian to identify your daily goals for nutrients listed on the label. Shopping  Avoid buying canned, premade, or processed foods. These foods tend to be high in fat, sodium, and added sugar.  Shop around the outside edge of the grocery store. This includes fresh fruits and vegetables, bulk grains, fresh meats, and fresh dairy. Cooking  Use low-heat cooking methods, such as baking, instead of high-heat cooking methods like deep frying.  Cook using healthy oils, such as olive, canola, or sunflower oil.  Avoid cooking with butter, cream, or high-fat meats. Meal planning  Eat meals and snacks regularly, preferably at the same times every day. Avoid going long periods of time without eating.  Eat foods high in fiber, such as fresh fruits, vegetables, beans, and whole grains. Talk to your dietitian about how many servings of  carbohydrates you can eat at each meal.  Eat 4-6 ounces of lean protein each day, such as lean meat, chicken, fish, eggs, or tofu. 1 ounce is equal to 1 ounce of meat, chicken, or fish, 1 egg, or 1/4 cup of tofu.  Eat some foods each day that contain healthy fats, such as avocado, nuts, seeds, and fish. Lifestyle   Check your blood glucose regularly.  Exercise at least 30 minutes 5 or more days each week, or as told by your health care provider.  Take medicines as told by your health care provider.  Do not use any products that contain nicotine or tobacco, such as cigarettes and e-cigarettes. If you need help quitting, ask your health care provider.  Work with a Social worker or diabetes educator to identify strategies to manage stress and any emotional and social challenges. What are some questions to ask my health care provider?  Do I need to meet with a diabetes educator?  Do I need to meet with a dietitian?  What number can I call if I have questions?  When are the best times to check my blood glucose? Where to find more information:  American Diabetes Association: diabetes.org/food-and-fitness/food  Academy of Nutrition and Dietetics: PokerClues.dk  [employer] of Diabetes and Digestive and Kidney Diseases (NIH): ContactWire.be Summary  A healthy meal plan will help you control your blood glucose and maintain a healthy lifestyle.  Working with a diet and nutrition specialist (dietitian) can help you make a meal plan that is best for you.  Keep in mind that carbohydrates and alcohol have immediate effects on your blood glucose levels. It is important to count carbohydrates and to use alcohol carefully. This information is not intended to replace advice given to you by your health care provider. Make sure you discuss any questions you have with your  health care provider. Document Released: 04/28/2005 Document Revised: 09/05/2016 Document Reviewed: 09/05/2016 Elsevier Interactive Patient Education  2018 [employer].   Steps to Quit Smoking Smoking tobacco can  be harmful to your health and can affect almost every organ in your body. Smoking puts you, and those around you, at risk for developing many serious chronic diseases. Quitting smoking is difficult, but it is one of the best things that you can do for your health. It is never too late to quit. What are the benefits of quitting smoking? When you quit smoking, you lower your risk of developing serious diseases and conditions, such as:  Lung cancer or lung disease, such as COPD.  Heart disease.  Stroke.  Heart attack.  Infertility.  Osteoporosis and bone fractures.  Additionally, symptoms such as coughing, wheezing, and shortness of breath may get better when you quit. You may also find that you get sick less often because your body is stronger at fighting off colds and infections. If you are pregnant, quitting smoking can help to reduce your chances of having a baby of low birth weight. How do I get ready to quit? When you decide to quit smoking, create a plan to make sure that you are successful. Before you quit:  Pick a date to quit. Set a date within the next two weeks to give you time to prepare.  Write down the reasons why you are quitting. Keep this list in places where you will see it often, such as on your bathroom mirror or in your car or wallet.  Identify the people, places, things, and activities that make you want to smoke (triggers) and avoid them. Make sure to take these actions: ? Throw away all cigarettes at home, at work, and in your car. ? Throw away smoking accessories, such as Scientist, research (medical). ? Clean your car and make sure to empty the ashtray. ? Clean your home, including curtains and carpets.  Tell your family, friends, and coworkers that you  are quitting. Support from your loved ones can make quitting easier.  Talk with your health care provider about your options for quitting smoking.  Find out what treatment options are covered by your health insurance.  What strategies can I use to quit smoking? Talk with your healthcare provider about different strategies to quit smoking. Some strategies include:  Quitting smoking altogether instead of gradually lessening how much you smoke over a period of time. Research shows that quitting "cold [place]" is more successful than gradually quitting.  Attending in-person counseling to help you build problem-solving skills. You are more likely to have success in quitting if you attend several counseling sessions. Even short sessions of 10 minutes can be effective.  Finding resources and support systems that can help you to quit smoking and remain smoke-free after you quit. These resources are most helpful when you use them often. They can include: ? Online chats with a Social worker. ? Telephone quitlines. ? Careers information officer. ? Support groups or group counseling. ? Text messaging programs. ? Mobile phone applications.  Taking medicines to help you quit smoking. (If you are pregnant or breastfeeding, talk with your health care provider first.) Some medicines contain nicotine and some do not. Both types of medicines help with cravings, but the medicines that include nicotine help to relieve withdrawal symptoms. Your health care provider may recommend: ? Nicotine patches, gum, or lozenges. ? Nicotine inhalers or sprays. ? Non-nicotine medicine that is taken by mouth.  Talk with your health care provider about combining strategies, such as taking medicines while you are also receiving in-person counseling. Using these two strategies together makes you more likely to succeed in  quitting than if you used either strategy on its own. If you are pregnant or breastfeeding, talk with your health  care provider about finding counseling or other support strategies to quit smoking. Do not take medicine to help you quit smoking unless told to do so by your health care provider. What things can I do to make it easier to quit? Quitting smoking might feel overwhelming at first, but there is a lot that you can do to make it easier. Take these important actions:  Reach out to your family and friends and ask that they support and encourage you during this time. Call telephone quitlines, reach out to support groups, or work with a counselor for support.  Ask people who smoke to avoid smoking around you.  Avoid places that trigger you to smoke, such as bars, parties, or smoke-break areas at work.  Spend time around people who do not smoke.  Lessen stress in your life, because stress can be a smoking trigger for some people. To lessen stress, try: ? Exercising regularly. ? Deep-breathing exercises. ? Yoga. ? Meditating. ? Performing a body scan. This involves closing your eyes, scanning your body from head to toe, and noticing which parts of your body are particularly tense. Purposefully relax the muscles in those areas.  Download or purchase mobile phone or tablet apps (applications) that can help you stick to your quit plan by providing reminders, tips, and encouragement. There are many free apps, such as QuitGuide from the [employer] Office manager for Disease Control and Prevention). You can find other support for quitting smoking (smoking cessation) through smokefree.gov and other websites.  How will I feel when I quit smoking? Within the first 24 hours of quitting smoking, you may start to feel some withdrawal symptoms. These symptoms are usually most noticeable 2-3 days after quitting, but they usually do not last beyond 2-3 weeks. Changes or symptoms that you might experience include:  Mood swings.  Restlessness, anxiety, or irritation.  Difficulty concentrating.  Dizziness.  Strong cravings  for sugary foods in addition to nicotine.  Mild weight gain.  Constipation.  Nausea.  Coughing or a sore throat.  Changes in how your medicines work in your body.  A depressed mood.  Difficulty sleeping (insomnia).  After the first 2-3 weeks of quitting, you may start to notice more positive results, such as:  Improved sense of smell and taste.  Decreased coughing and sore throat.  Slower heart rate.  Lower blood pressure.  Clearer skin.  The ability to breathe more easily.  Fewer sick days.  Quitting smoking is very challenging for most people. Do not get discouraged if you are not successful the first time. Some people need to make many attempts to quit before they achieve long-term success. Do your best to stick to your quit plan, and talk with your health care provider if you have any questions or concerns. This information is not intended to replace advice given to you by your health care provider. Make sure you discuss any questions you have with your health care provider. Document Released: 07/26/2001 Document Revised: 03/29/2016 Document Reviewed: 12/16/2014 Elsevier Interactive Patient Education  2018 [employer].    IF you received an x-ray today, you will receive an invoice from [facility]. Please contact [facility] at [phone] with questions or concerns regarding your invoice.   IF you received labwork today, you will receive an invoice from [facility]. Please contact [facility] at [phone] with questions or concerns regarding your invoice.  Our billing staff will not be able to assist you with questions regarding bills from these companies.  You will be contacted with the lab results as soon as they are available. The fastest way to get your results is to activate your My Chart account. Instructions are located on the last page of this paperwork. If you have not heard from [place] regarding the results in 2 weeks, please contact this  office.

## 2018-02-22 NOTE — Progress Notes (Signed)
Subjective:  By signing my name below, I, Phillip Maynard, attest that this documentation has been prepared under the direction and in the presence of [name], MD. Electronically Signed: [name], [place]. 02/22/2018 , 5:58 PM .  Patient was seen in Room 10 .   Patient ID: Phillip Barrette., male    DOB: 1941-01-06, 77 y.o.   MRN: [id number] Chief Complaint  Patient presents with  . Diabetes    f/u   HPI Phillip Trudo. is a 77 y.o. male Here for follow up. He is not fasting today, last eaten at 12:00PM. 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   HTN BP Readings from Last 3 Encounters:  02/22/18 132/68  11/03/17 122/68  08/04/17 128/66   Lab Results  Component Value Date   CREATININE 1.17 01/20/2017   He did have testing for memory changes with [facility] neurology for neuropsych testing. There was no evidence of cognitive impairment or developing dementia; thought to have normal aging.   He takes half tablet of Hyzaar 100-12.5 mg qd. He states he hasn't missed any doses. He checks his BP at home, running 135-139, and a little higher in the mornings. He denies shortness of breath, chest pain, lightheadedness or dizziness.   Tobacco use He's still smoking about 1/3 ppd; he's taking steps towards quitting.   Cough: He mentions having intermittent cough ongoing for a while now. He denies any fevers with the cough. He believes it's from smoking. He will return to discuss this at another visit.   Diabetes He was last seen in Sept 2018, specifically for memory symptoms at that time.   He had sent in an A1C kit from [facility], and reported result A1C of 6.8. He admits to eating a lot of Twinkies back in May. He denies taking medications for diabetes. He plans to make an appointment with eye doctor next week.   Lab Results  Component Value Date   HGBA1C 7.0 (H) 01/20/2017    Hyperlipidemia Lab Results  Component Value Date   CHOL 187 01/20/2017   HDL 45  01/20/2017   LDLCALC 114 (H) 01/20/2017   TRIG 141 01/20/2017   CHOLHDL 4.2 01/20/2017    Lab Results  Component Value Date   ALT 12 01/20/2017   AST 15 01/20/2017   ALKPHOS 55 01/20/2017   BILITOT 0.3 01/20/2017   No recent testing; he was started on pravastatin but not currently taking. He states he never started pravastatin.   Patient Active Problem List   Diagnosis Date Noted  . 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 Diverticulitis of colon 07/12/2016  . Severe sepsis(995.92) 04/05/2013  . Pyelonephritis 04/05/2013  . Hypertension 02/28/2013  . Type 2 diabetes mellitus ([place]) 02/28/2013  . Chronic hepatitis C ([place]) 02/14/2011  . Urinary (tract) obstruction 02/14/2011  . TOBACCO ABUSE 12/10/2007  . 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 Asthma    "when I was a boy" (04/08/2013)  . Bronchitis   . Colon polyps   . Colovesical fistula   . Diverticulosis   . GERD (gastroesophageal reflux disease)   . Hepatitis C    treated with injections  . High cholesterol   . Hypertension   . Type II diabetes mellitus (HCC)    hx of . No longer on medicine   Past Surgical History:  Procedure Laterality Date  . CYSTOSCOPY N/A 09/08/2016   Procedure: FIREFLY INJECTIONS;  Surgeon: Cleon Gustin, MD;  Location: [facility] ORS;  Service: Urology;  Laterality: N/A;  . INCISION AND DRAINAGE OF WOUND Right 1970's   "leg" (04/08/2013)  . LIVER BIOPSY  ~ 2011  . PROCTOSCOPY N/A 09/08/2016   Procedure: RIGID PROCTOSCOPY;  Surgeon: Michael Boston, MD;  Location: [facility];  Service: General;  Laterality: N/A;  . TONSILLECTOMY     Allergies  Allergen Reactions  . Penicillins Anaphylaxis and Other (See Comments)    Has patient had a PCN reaction causing immediate rash, facial/tongue/throat swelling, SOB or lightheadedness with hypotension: Yes Has patient had a PCN reaction causing severe rash involving mucus membranes or skin  necrosis: No Has patient had a PCN reaction that required hospitalization No Has patient had a PCN reaction occurring within the last 10 years: No If all of the above answers are "NO", then may proceed with Cephalosporin use.  . Shellfish Allergy Anaphylaxis   Prior to Admission medications   Medication Sig Start Date End Date Taking? Authorizing Provider  albuterol (PROVENTIL HFA;VENTOLIN HFA) 108 (90 Base) MCG/ACT inhaler Inhale 2 puffs into the lungs every 6 (six) hours as needed for wheezing or shortness of breath. 11/03/17   Weber, Damaris Hippo, PA-C  finasteride (PROSCAR) 5 MG tablet Take 5 mg by mouth daily. 09/22/17   [provider]  ipratropium (ATROVENT) 0.06 % nasal spray Place 2 sprays into the nose 3 (three) times daily. 11/03/17   Gale Journey, Damaris Hippo, PA-C  losartan-hydrochlorothiazide (HYZAAR) 100-12.5 MG tablet TAKE 1/2 TABLET EVERY DAY 02/20/18   Wendie Agreste, MD  tamsulosin (FLOMAX) 0.4 MG CAPS capsule Take 0.4 mg by mouth at bedtime.     [provider]   Social History   Socioeconomic History  . Marital status: Married    Spouse name: Not on file  . Number of children: 2  . Years of education: Not on file  . Highest education level: Not on file  Occupational History  . Occupation: truck Education administrator: [employer]    Comment: retired  Scientific laboratory technician  . Financial resource strain: Not on file  . Food insecurity:    Worry: Not on file    Inability: Not on file  . Transportation needs:    Medical: Not on file    Non-medical: Not on file  Tobacco Use  . Smoking status: Current Every Day Smoker    Packs/day: 0.25    Years: 45.00    Pack years: 11.25    Types: Cigarettes  . Smokeless tobacco: Never Used  Substance and Sexual Activity  . Alcohol use: Yes    Comment: 04/08/2013 "haven't drank nothing in > 3 yr; before then I'd have at least 1 pint/day"  . Drug use: No  . Sexual activity: Yes  Lifestyle  . Physical activity:    Days per week: Not  on file    Minutes per session: Not on file  . Stress: Not on file  Relationships  . Social connections:    Talks on phone: Not on file    Gets together: Not on file    Attends religious service: Not on file    Active member of club or organization: Not on file    Attends meetings of clubs or organizations: Not on file    Relationship status: Not on file  . Intimate partner violence:    Fear of current or ex partner: Not on file    Emotionally abused: Not on file  Physically abused: Not on file    Forced sexual activity: Not on file  Other Topics Concern  . Not on file  Social History Narrative   Pt lives in 1 story home with his wife   Has 2 adult children   Highest level of education: GED & Trade school   Retired Media planner.    Review of Systems  Constitutional: Negative for fatigue, fever and unexpected weight change.  Eyes: Negative for visual disturbance.  Respiratory: Positive for cough. Negative for chest tightness and shortness of breath.   Cardiovascular: Negative for chest pain, palpitations and leg swelling.  Gastrointestinal: Negative for abdominal pain and Maynard in stool.  Neurological: Negative for dizziness, light-headedness and headaches.       Objective:   Physical Exam  Constitutional: He is oriented to person, place, and time. He appears well-developed and well-nourished.  HENT:  Head: Normocephalic and atraumatic.  Eyes: Pupils are equal, round, and reactive to light. EOM are normal.  Neck: No JVD present. Carotid bruit is not present.  Cardiovascular: Normal rate, regular rhythm and normal heart sounds.  No murmur heard. Pulmonary/Chest: Effort normal and breath sounds normal. He has no rales.  Musculoskeletal: He exhibits no edema.  Neurological: He is alert and oriented to person, place, and time.  Skin: Skin is warm and dry.  Psychiatric: He has a normal mood and affect.  Vitals reviewed.   Vitals:   02/22/18 1738 02/22/18 1741   BP: (!) 148/67 132/68  Pulse: 62   Temp: 98.3 F (36.8 C)   TempSrc: Oral   SpO2: 97%   Weight: 176 lb 3.2 oz (79.9 kg)   Height: 5' 9" (1.753 m)        Assessment & Plan:    Phillip Usery. is a 77 y.o. male Type 2 diabetes mellitus without complication, without long-term current use of insulin ([place]) - Plan: Hemoglobin A1c, Comprehensive metabolic panel  -Suspect some diet nonadherence based on discussion today.  Stressed importance of good nutrition, especially if we are treating diabetes with diet control only.  Check A1c, handout given on diabetes and nutrition as well as diabetes type 2 with self-care.  Essential hypertension - Plan: losartan-hydrochlorothiazide (HYZAAR) 100-12.5 MG tablet  -Borderline elevated.  Continue half dose of losartan HCTZ for now, but if running over 140/90 at home, likely will need to adjust the dose.  RTC precautions discussed.  Hyperlipidemia, unspecified hyperlipidemia type - Plan: Comprehensive metabolic panel  -CMP pending, plans on follow-up in 2 weeks, and at that time can perform fasting labs.  Tobacco use disorder  -Cessation discussed and handout provided.  Advised to let me know if I can help him with cessation  Meds ordered this encounter  Medications  . losartan-hydrochlorothiazide (HYZAAR) 100-12.5 MG tablet    Sig: Take 0.5 tablets by mouth daily.    Dispense:  45 tablet    Refill:  1   Patient Instructions   Maynard pressure is running borderline high. Keep a record of your Maynard pressures outside of the office and if running over 140/90 at home, follow up in the next week to adjust meds.   Return in next 2 weeks for morning appointment so we can check fasting labs (8 hours fasting).   I will repeat your diabetes test here today. See info on diet with diabetes and self care, but we can review other diabetes treatment next visit if needed.   Let me know if I help you  quit smoking. Please follow up in next 2 weeks to  discuss cough. Return to the clinic or go to the nearest emergency room if any of your symptoms worsen or new symptoms occur.  Schedule eye doctor appointment as planned.   Thanks for coming in today.    Type 2 Diabetes Mellitus, Self Care, Adult When you have type 2 diabetes (type 2 diabetes mellitus), you must keep your Maynard sugar (glucose) under control. You can do this with:  Nutrition.  Exercise.  Lifestyle changes.  Medicines or insulin, if needed.  Support from your doctors and others.  How do I manage my Maynard sugar?  Check your Maynard sugar level every day, as often as told.  Call your doctor if your Maynard sugar is above your goal numbers for 2 tests in a row.  Have your A1c (hemoglobin A1c) level checked at least two times a year. Have it checked more often if your doctor tells you to. Your doctor will set treatment goals for you. Generally, you should have these Maynard sugar levels:  Before meals (preprandial): 80-130 mg/dL (4.4-7.2 mmol/L).  After meals (postprandial): lower than 180 mg/dL (10 mmol/L).  A1c level: less than 7%.  What do I need to know about high Maynard sugar? High Maynard sugar is called hyperglycemia. Know the signs of high Maynard sugar. Signs may include:  Feeling: ? Thirsty. ? Hungry. ? Very tired.  Needing to pee (urinate) more than usual.  Blurry vision.  What do I need to know about low Maynard sugar? Low Maynard sugar is called hypoglycemia. This is when Maynard sugar is at or below 70 mg/dL (3.9 mmol/L). Symptoms may include:  Feeling: ? Hungry. ? Worried or nervous (anxious). ? Sweaty and clammy. ? Confused. ? Dizzy. ? Sleepy. ? Sick to your stomach (nauseous).  Having: ? A fast heartbeat (palpitations). ? A headache. ? A change in your vision. ? Jerky movements that you cannot control (seizure). ? Nightmares. ? Tingling or no feeling (numbness) around the mouth, lips, or tongue.  Having trouble with: ? Talking. ? Paying  attention (concentrating). ? Moving (coordination). ? Sleeping.  Shaking.  Passing out (fainting).  Getting upset easily (irritability).  Treating low Maynard sugar  To treat low Maynard sugar, eat or drink something sugary right away. If you can think clearly and swallow safely, follow the 15:15 rule:  Take 15 grams of a fast-acting carb (carbohydrate). Some fast-acting carbs are: ? 1 tube of glucose gel. ? 3 sugar tablets (glucose pills). ? 6-8 pieces of hard candy. ? 4 oz (120 mL) of fruit juice. ? 4 oz (120 mL) regular (not diet) soda.  Check your Maynard sugar 15 minutes after you take the carb.  If your Maynard sugar is still at or below 70 mg/dL (3.9 mmol/L), take 15 grams of a carb again.  If your Maynard sugar does not go above 70 mg/dL (3.9 mmol/L) after 3 tries, get help right away.  After your Maynard sugar goes back to normal, eat a meal or a snack within 1 hour.  Treating very low Maynard sugar If your Maynard sugar is at or below 54 mg/dL (3 mmol/L), you have very low Maynard sugar (severe hypoglycemia). This is an emergency. Do not wait to see if the symptoms will go away. Get medical help right away. Call your local emergency services (911 in the [place] Do not drive yourself to the hospital. If you have very low Maynard sugar and you cannot eat or drink,  you may need a glucagon shot (injection). A family member or friend should learn how to check your Maynard sugar and how to give you a glucagon shot. Ask your doctor if you need to have a glucagon shot kit at home. What else is important to manage my diabetes? Medicine Follow these instructions about insulin and diabetes medicines:  Take them as told by your doctor.  Adjust them as told by your doctor.  Do not run out of them.  Having diabetes can raise your risk for other long-term conditions. These include heart or kidney disease. Your doctor may prescribe medicines to help prevent problems from diabetes. Food   Make  healthy food choices. These include: ? Chicken, fish, egg whites, and beans. ? Oats, whole wheat, bulgur, brown rice, quinoa, and millet. ? Fresh fruits and vegetables. ? Low-fat dairy products. ? Nuts, avocado, olive oil, and canola oil.  Make a food plan with a specialist (dietitian).  Follow instructions from your doctor about what you cannot eat or drink.  Drink enough fluid to keep your pee (urine) clear or pale yellow.  Eat healthy snacks between healthy meals.  Keep track of carbs that you eat. Read food labels. Learn food serving sizes.  Follow your sick day plan when you cannot eat or drink normally. Make this plan with your doctor so it is ready to use. Activity  Exercise at least 3 times a week.  Do not go more than 2 days without exercising.  Talk with your doctor before you start a new exercise. Your doctor may need to adjust your insulin, medicines, or food. Lifestyle   Do not use any tobacco products. These include cigarettes, chewing tobacco, and e-cigarettes.If you need help quitting, ask your doctor.  Ask your doctor how much alcohol is safe for you.  Learn to deal with stress. If you need help with this, ask your doctor. Body care  Stay up to date with your shots (immunizations).  Have your eyes and feet checked by a doctor as often as told.  Check your skin and feet every day. Check for cuts, bruises, redness, blisters, or sores.  Brush your teeth and gums two times a day.  Floss at least one time a day.  Go to the dentist least one time every 6 months.  Stay at a healthy weight. General instructions   Take over-the-counter and prescription medicines only as told by your doctor.  Share your diabetes care plan with: ? Your work or school. ? People you live with.  Check your pee (urine) for ketones: ? When you are sick. ? As told by your doctor.  Carry a card or wear jewelry that says that you have diabetes.  Ask your doctor: ? Do I  need to meet with a diabetes educator? ? Where can I find a support group for people with diabetes?  Keep all follow-up visits as told by your doctor. This is important. Where to find more information: To learn more about diabetes, visit:  American Diabetes Association: www.diabetes.org  American Association of Diabetes Educators: www.diabeteseducator.org/patient-resources  This information is not intended to replace advice given to you by your health care provider. Make sure you discuss any questions you have with your health care provider. Document Released: 11/23/2015 Document Revised: 01/07/2016 Document Reviewed: 09/04/2015 Elsevier Interactive Patient Education  2018 [employer].   Diabetes Mellitus and Nutrition When you have diabetes (diabetes mellitus), it is very important to have healthy eating habits because your Maynard sugar (  glucose) levels are greatly affected by what you eat and drink. Eating healthy foods in the appropriate amounts, at about the same times every day, can help you:  Control your Maynard glucose.  Lower your risk of heart disease.  Improve your Maynard pressure.  Reach or maintain a healthy weight.  Every person with diabetes is different, and each person has different needs for a meal plan. Your health care provider may recommend that you work with a diet and nutrition specialist (dietitian) to make a meal plan that is best for you. Your meal plan may vary depending on factors such as:  The calories you need.  The medicines you take.  Your weight.  Your Maynard glucose, Maynard pressure, and cholesterol levels.  Your activity level.  Other health conditions you have, such as heart or kidney disease.  How do carbohydrates affect me? Carbohydrates affect your Maynard glucose level more than any other type of food. Eating carbohydrates naturally increases the amount of glucose in your Maynard. Carbohydrate counting is a method for keeping track of how many  carbohydrates you eat. Counting carbohydrates is important to keep your Maynard glucose at a healthy level, especially if you use insulin or take certain oral diabetes medicines. It is important to know how many carbohydrates you can safely have in each meal. This is different for every person. Your dietitian can help you calculate how many carbohydrates you should have at each meal and for snack. Foods that contain carbohydrates include:  Bread, cereal, rice, pasta, and crackers.  Potatoes and corn.  Peas, beans, and lentils.  Milk and yogurt.  Fruit and juice.  Desserts, such as cakes, cookies, ice cream, and candy.  How does alcohol affect me? Alcohol can cause a sudden decrease in Maynard glucose (hypoglycemia), especially if you use insulin or take certain oral diabetes medicines. Hypoglycemia can be a life-threatening condition. Symptoms of hypoglycemia (sleepiness, dizziness, and confusion) are similar to symptoms of having too much alcohol. If your health care provider says that alcohol is safe for you, follow these guidelines:  Limit alcohol intake to no more than 1 drink per day for nonpregnant women and 2 drinks per day for men. One drink equals 12 oz of beer, 5 oz of wine, or 1 oz of hard liquor.  Do not drink on an empty stomach.  Keep yourself hydrated with water, diet soda, or unsweetened iced tea.  Keep in mind that regular soda, juice, and other mixers may contain a lot of sugar and must be counted as carbohydrates.  What are tips for following this plan? Reading food labels  Start by checking the serving size on the label. The amount of calories, carbohydrates, fats, and other nutrients listed on the label are based on one serving of the food. Many foods contain more than one serving per package.  Check the total grams (g) of carbohydrates in one serving. You can calculate the number of servings of carbohydrates in one serving by dividing the total carbohydrates by 15.  For example, if a food has 30 g of total carbohydrates, it would be equal to 2 servings of carbohydrates.  Check the number of grams (g) of saturated and trans fats in one serving. Choose foods that have low or no amount of these fats.  Check the number of milligrams (mg) of sodium in one serving. Most people should limit total sodium intake to less than 2,300 mg per day.  Always check the nutrition information of foods labeled as "  low-fat" or "nonfat". These foods may be higher in added sugar or refined carbohydrates and should be avoided.  Talk to your dietitian to identify your daily goals for nutrients listed on the label. Shopping  Avoid buying canned, premade, or processed foods. These foods tend to be high in fat, sodium, and added sugar.  Shop around the outside edge of the grocery store. This includes fresh fruits and vegetables, bulk grains, fresh meats, and fresh dairy. Cooking  Use low-heat cooking methods, such as baking, instead of high-heat cooking methods like deep frying.  Cook using healthy oils, such as olive, canola, or sunflower oil.  Avoid cooking with butter, cream, or high-fat meats. Meal planning  Eat meals and snacks regularly, preferably at the same times every day. Avoid going long periods of time without eating.  Eat foods high in fiber, such as fresh fruits, vegetables, beans, and whole grains. Talk to your dietitian about how many servings of carbohydrates you can eat at each meal.  Eat 4-6 ounces of lean protein each day, such as lean meat, chicken, fish, eggs, or tofu. 1 ounce is equal to 1 ounce of meat, chicken, or fish, 1 egg, or 1/4 cup of tofu.  Eat some foods each day that contain healthy fats, such as avocado, nuts, seeds, and fish. Lifestyle   Check your Maynard glucose regularly.  Exercise at least 30 minutes 5 or more days each week, or as told by your health care provider.  Take medicines as told by your health care provider.  Do not  use any products that contain nicotine or tobacco, such as cigarettes and e-cigarettes. If you need help quitting, ask your health care provider.  Work with a Social worker or diabetes educator to identify strategies to manage stress and any emotional and social challenges. What are some questions to ask my health care provider?  Do I need to meet with a diabetes educator?  Do I need to meet with a dietitian?  What number can I call if I have questions?  When are the best times to check my Maynard glucose? Where to find more information:  American Diabetes Association: diabetes.org/food-and-fitness/food  Academy of Nutrition and Dietetics: PokerClues.dk  [employer] of Diabetes and Digestive and Kidney Diseases (NIH): ContactWire.be Summary  A healthy meal plan will help you control your Maynard glucose and maintain a healthy lifestyle.  Working with a diet and nutrition specialist (dietitian) can help you make a meal plan that is best for you.  Keep in mind that carbohydrates and alcohol have immediate effects on your Maynard glucose levels. It is important to count carbohydrates and to use alcohol carefully. This information is not intended to replace advice given to you by your health care provider. Make sure you discuss any questions you have with your health care provider. Document Released: 04/28/2005 Document Revised: 09/05/2016 Document Reviewed: 09/05/2016 Elsevier Interactive Patient Education  2018 [employer].   Steps to Quit Smoking Smoking tobacco can be harmful to your health and can affect almost every organ in your body. Smoking puts you, and those around you, at risk for developing many serious chronic diseases. Quitting smoking is difficult, but it is one of the best things that you can do for your health. It is never too late to quit. What are  the benefits of quitting smoking? When you quit smoking, you lower your risk of developing serious diseases and conditions, such as:  Lung cancer or lung disease, such as COPD.  Heart disease.  Stroke.  Heart attack.  Infertility.  Osteoporosis and bone fractures.  Additionally, symptoms such as coughing, wheezing, and shortness of breath may get better when you quit. You may also find that you get sick less often because your body is stronger at fighting off colds and infections. If you are pregnant, quitting smoking can help to reduce your chances of having a baby of low birth weight. How do I get ready to quit? When you decide to quit smoking, create a plan to make sure that you are successful. Before you quit:  Pick a date to quit. Set a date within the next two weeks to give you time to prepare.  Write down the reasons why you are quitting. Keep this list in places where you will see it often, such as on your bathroom mirror or in your car or wallet.  Identify the people, places, things, and activities that make you want to smoke (triggers) and avoid them. Make sure to take these actions: ? Throw away all cigarettes at home, at work, and in your car. ? Throw away smoking accessories, such as Scientist, research (medical). ? Clean your car and make sure to empty the ashtray. ? Clean your home, including curtains and carpets.  Tell your family, friends, and coworkers that you are quitting. Support from your loved ones can make quitting easier.  Talk with your health care provider about your options for quitting smoking.  Find out what treatment options are covered by your health insurance.  What strategies can I use to quit smoking? Talk with your healthcare provider about different strategies to quit smoking. Some strategies include:  Quitting smoking altogether instead of gradually lessening how much you smoke over a period of time. Research shows that quitting "cold [place]" is  more successful than gradually quitting.  Attending in-person counseling to help you build problem-solving skills. You are more likely to have success in quitting if you attend several counseling sessions. Even short sessions of 10 minutes can be effective.  Finding resources and support systems that can help you to quit smoking and remain smoke-free after you quit. These resources are most helpful when you use them often. They can include: ? Online chats with a Social worker. ? Telephone quitlines. ? Careers information officer. ? Support groups or group counseling. ? Text messaging programs. ? Mobile phone applications.  Taking medicines to help you quit smoking. (If you are pregnant or breastfeeding, talk with your health care provider first.) Some medicines contain nicotine and some do not. Both types of medicines help with cravings, but the medicines that include nicotine help to relieve withdrawal symptoms. Your health care provider may recommend: ? Nicotine patches, gum, or lozenges. ? Nicotine inhalers or sprays. ? Non-nicotine medicine that is taken by mouth.  Talk with your health care provider about combining strategies, such as taking medicines while you are also receiving in-person counseling. Using these two strategies together makes you more likely to succeed in quitting than if you used either strategy on its own. If you are pregnant or breastfeeding, talk with your health care provider about finding counseling or other support strategies to quit smoking. Do not take medicine to help you quit smoking unless told to do so by your health care provider. What things can I do to make it easier to quit? Quitting smoking might feel overwhelming at first, but there is a lot that you can do to make it easier. Take these important actions:  Reach out to your  family and friends and ask that they support and encourage you during this time. Call telephone quitlines, reach out to support groups,  or work with a counselor for support.  Ask people who smoke to avoid smoking around you.  Avoid places that trigger you to smoke, such as bars, parties, or smoke-break areas at work.  Spend time around people who do not smoke.  Lessen stress in your life, because stress can be a smoking trigger for some people. To lessen stress, try: ? Exercising regularly. ? Deep-breathing exercises. ? Yoga. ? Meditating. ? Performing a body scan. This involves closing your eyes, scanning your body from head to toe, and noticing which parts of your body are particularly tense. Purposefully relax the muscles in those areas.  Download or purchase mobile phone or tablet apps (applications) that can help you stick to your quit plan by providing reminders, tips, and encouragement. There are many free apps, such as QuitGuide from the [employer] Office manager for Disease Control and Prevention). You can find other support for quitting smoking (smoking cessation) through smokefree.gov and other websites.  How will I feel when I quit smoking? Within the first 24 hours of quitting smoking, you may start to feel some withdrawal symptoms. These symptoms are usually most noticeable 2-3 days after quitting, but they usually do not last beyond 2-3 weeks. Changes or symptoms that you might experience include:  Mood swings.  Restlessness, anxiety, or irritation.  Difficulty concentrating.  Dizziness.  Strong cravings for sugary foods in addition to nicotine.  Mild weight gain.  Constipation.  Nausea.  Coughing or a sore throat.  Changes in how your medicines work in your body.  A depressed mood.  Difficulty sleeping (insomnia).  After the first 2-3 weeks of quitting, you may start to notice more positive results, such as:  Improved sense of smell and taste.  Decreased coughing and sore throat.  Slower heart rate.  Lower [place]nard pressure.  Clearer skin.  The ability to breathe more easily.  Fewer sick  days.  Quitting smoking is very challenging for most people. Do not get discouraged if you are not successful the first time. Some people need to make many attempts to quit before they achieve long-term success. Do your best to stick to your quit plan, and talk with your health care provider if you have any questions or concerns. This information is not intended to replace advice given to you by your health care provider. Make sure you discuss any questions you have with your health care provider. Document Released: 07/26/2001 Document Revised: 03/29/2016 Document Reviewed: 12/16/2014 Elsevier Interactive Patient Education  2018 [employer].    IF you received an x-ray today, you will receive an invoice from [facility]. Please contact [facility] at [phone] with questions or concerns regarding your invoice.   IF you received labwork today, you will receive an invoice from [place]. Please contact [facility] at [phone] with questions or concerns regarding your invoice.   Our billing staff will not be able to assist you with questions regarding bills from these companies.  You will be contacted with the lab results as soon as they are available. The fastest way to get your results is to activate your My Chart account. Instructions are located on the last page of this paperwork. If you have not heard from [place] regarding the results in 2 weeks, please contact this office.       I personally performed the services described in this documentation, which was  scribed in my presence. The recorded information has been reviewed and considered for accuracy and completeness, addended by me as needed, and agree with information above.  Signed,   [name], MD [facility] [place].  02/23/18 12:32 PM

## 2018-02-23 LAB — HEMOGLOBIN A1C
ESTIMATED AVERAGE GLUCOSE: 140 mg/dL
HEMOGLOBIN A1C: 6.5 % — AB (ref 4.8–5.6)

## 2018-02-23 LAB — COMPREHENSIVE METABOLIC PANEL WITH GFR
ALT: 19 [IU]/L (ref 0–44)
AST: 21 [IU]/L (ref 0–40)
Albumin/Globulin Ratio: 1.4 (ref 1.2–2.2)
Albumin: 4.4 g/dL (ref 3.5–4.8)
Alkaline Phosphatase: 52 [IU]/L (ref 39–117)
BUN/Creatinine Ratio: 18 (ref 10–24)
BUN: 23 mg/dL (ref 8–27)
Bilirubin Total: 0.5 mg/dL (ref 0.0–1.2)
CO2: 20 mmol/L (ref 20–29)
Calcium: 9.2 mg/dL (ref 8.6–10.2)
Chloride: 100 mmol/L (ref 96–106)
Creatinine, Ser: 1.28 mg/dL — ABNORMAL HIGH (ref 0.76–1.27)
GFR calc Af Amer: 63 mL/min/{1.73_m2}
GFR calc non Af Amer: 54 mL/min/{1.73_m2} — ABNORMAL LOW
Globulin, Total: 3.1 g/dL (ref 1.5–4.5)
Glucose: 91 mg/dL (ref 65–99)
Potassium: 4.4 mmol/L (ref 3.5–5.2)
Sodium: 138 mmol/L (ref 134–144)
Total Protein: 7.5 g/dL (ref 6.0–8.5)

## 2018-03-09 ENCOUNTER — Ambulatory Visit: Payer: Medicare HMO | Admitting: Family Medicine

## 2018-03-09 ENCOUNTER — Encounter: Payer: Self-pay | Admitting: Family Medicine

## 2018-03-09 VITALS — BP 128/60 | HR 62 | Temp 98.6°F | Resp 17 | Ht 69.0 in | Wt 178.0 lb

## 2018-03-09 DIAGNOSIS — D225 Melanocytic nevi of trunk: Secondary | ICD-10-CM

## 2018-03-09 DIAGNOSIS — E785 Hyperlipidemia, unspecified: Secondary | ICD-10-CM

## 2018-03-09 DIAGNOSIS — R7989 Other specified abnormal findings of blood chemistry: Secondary | ICD-10-CM

## 2018-03-09 DIAGNOSIS — I10 Essential (primary) hypertension: Secondary | ICD-10-CM

## 2018-03-09 LAB — BASIC METABOLIC PANEL
BUN/Creatinine Ratio: 12 (ref 10–24)
BUN: 11 mg/dL (ref 8–27)
CHLORIDE: 100 mmol/L (ref 96–106)
CO2: 23 mmol/L (ref 20–29)
CREATININE: 0.93 mg/dL (ref 0.76–1.27)
Calcium: 9.3 mg/dL (ref 8.6–10.2)
GFR calc Af Amer: 93 mL/min/{1.73_m2} (ref >59)
GFR calc non Af Amer: 80 mL/min/{1.73_m2} (ref >59)
GLUCOSE: 111 mg/dL — AB (ref 65–99)
POTASSIUM: 4.5 mmol/L (ref 3.5–5.2)
SODIUM: 137 mmol/L (ref 134–144)

## 2018-03-09 LAB — LIPID PANEL
CHOL/HDL RATIO: 2.7 ratio (ref 0.0–5.0)
Cholesterol, Total: 133 mg/dL (ref 100–199)
HDL: 49 mg/dL (ref >39)
LDL CALC: 73 mg/dL (ref 0–99)
Triglycerides: 55 mg/dL (ref 0–149)
VLDL Cholesterol Cal: 11 mg/dL (ref 5–40)

## 2018-03-09 NOTE — Patient Instructions (Addendum)
Keep a record of your blood pressures outside of the office and if running over 140/90, let me know so we change meds.   I will recheck the kidney test, but it was only borderline elevated.   Depending on cholesterol test, we can decide on statin medication and dose.   Bump on lower back may be a small skin cyst, but I will refer you to a dermatologist to have that evaluated further and possibly removed.   Recheck in next 3 months for diabetes and wellness exam.   IF you received an x-ray today, you will receive an invoice from [facility]. Please contact [facility] at [phone] with questions or concerns regarding your invoice.   IF you received labwork today, you will receive an invoice from [place]. Please contact [facility] at [phone] with questions or concerns regarding your invoice.   Our billing staff will not be able to assist you with questions regarding bills from these companies.  You will be contacted with the lab results as soon as they are available. The fastest way to get your results is to activate your My Chart account. Instructions are located on the last page of this paperwork. If you have not heard from [place] regarding the results in 2 weeks, please contact this office.

## 2018-03-09 NOTE — Progress Notes (Signed)
Subjective:  By signing my name below, I, Essence Howell, attest that this documentation has been prepared under the direction and in the presence of Wendie Agreste, MD Electronically Signed: Ladene Artist, ED Scribe 03/09/2018 at 9:06 AM.   Patient ID: Phillip Barrette., male    DOB: [birth year], 77 y.o.   MRN: [id number]  Chief Complaint  Patient presents with  . Hypertension  . Cough   HPI Phillip Weisensel. is a 77 y.o. male who presents to [facility] at [facility] for f/u. Last seen 7/11. Pt had a cup of coffee this morning with a packet of sugar.  HTN Borderline elevation last visit. Continue same regimen but recommended home monitoring. - Home systolic readings ranges 250-539. BP Readings from Last 3 Encounters:  03/09/18 128/60  02/22/18 132/68  11/03/17 122/68   Lab Results  Component Value Date   CREATININE 1.28 (H) 02/22/2018  Borderline, slightly elevated from 1.17 last yr. - Denies using NSAIDs. He is drinking plenty of water.  DM Currently on diet control only. Discussed improved nutrition and handout given last OV. Recommended optho exam, planned to make appointment. - Pt has an optho exam scheduled for 7/29.  Hyperlipidemia Lab Results  Component Value Date   CHOL 187 01/20/2017   HDL 45 01/20/2017   LDLCALC 114 (H) 01/20/2017   TRIG 141 01/20/2017   CHOLHDL 4.2 01/20/2017   Lab Results  Component Value Date   ALT 19 02/22/2018   AST 21 02/22/2018   ALKPHOS 52 02/22/2018   BILITOT 0.5 02/22/2018  Prescribed statin in the past but never started. Here for fasting labs today. - Pt states he never picked up statin, he was unsure what pharmacy it was sent to.  Nevus Pt also presents with a gradually enlarging mole to the L lower back near his belt line x 1 yr. States the area has started growing in size over the past few months and rubs against his belt.  Patient Active Problem List   Diagnosis Date Noted  . 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 Diverticulitis of colon 07/12/2016  . Severe sepsis(995.92) 04/05/2013  . Pyelonephritis 04/05/2013  . Hypertension 02/28/2013  . Type 2 diabetes mellitus ([place]) 02/28/2013  . Chronic hepatitis C ([place]) 02/14/2011  . Urinary (tract) obstruction 02/14/2011  . TOBACCO ABUSE 12/10/2007  . 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 Asthma    "when I was a boy" (04/08/2013)  . Bronchitis   . Colon polyps   . Colovesical fistula   . Diverticulosis   . GERD (gastroesophageal reflux disease)   . Hepatitis C    treated with injections  . High cholesterol   . Hypertension   . Type II diabetes mellitus ([facility])    hx of . No longer on medicine   Past Surgical History:  Procedure Laterality Date  . CYSTOSCOPY N/A 09/08/2016   Procedure: FIREFLY INJECTIONS;  Surgeon: Cleon Gustin, MD;  Location: [facility];  Service: Urology;  Laterality: N/A;  . INCISION AND DRAINAGE OF WOUND Right 1970's   "leg" (04/08/2013)  . LIVER BIOPSY  ~ 2011  . PROCTOSCOPY N/A 09/08/2016   Procedure: RIGID PROCTOSCOPY;  Surgeon: Michael Boston, MD;  Location: [facility];  Service: General;  Laterality: N/A;  . TONSILLECTOMY     Allergies  Allergen Reactions  . Penicillins Anaphylaxis and Other (See Comments)    Has patient had a PCN reaction causing  immediate rash, facial/tongue/throat swelling, SOB or lightheadedness with hypotension: Yes Has patient had a PCN reaction causing severe rash involving mucus membranes or skin necrosis: No Has patient had a PCN reaction that required hospitalization No Has patient had a PCN reaction occurring within the last 10 years: No If all of the above answers are "NO", then may proceed with Cephalosporin use.  . Shellfish Allergy Anaphylaxis   Prior to Admission medications   Medication Sig Start Date End Date Taking? Authorizing Provider  albuterol (PROVENTIL HFA;VENTOLIN HFA) 108 (90 Base) MCG/ACT inhaler  Inhale 2 puffs into the lungs every 6 (six) hours as needed for wheezing or shortness of breath. 11/03/17  Yes Weber, Sarah L, PA-C  finasteride (PROSCAR) 5 MG tablet Take 5 mg by mouth daily. 09/22/17  Yes [provider]  ipratropium (ATROVENT) 0.06 % nasal spray Place 2 sprays into the nose 3 (three) times daily. 11/03/17  Yes Weber, Damaris Hippo, PA-C  losartan-hydrochlorothiazide (HYZAAR) 100-12.5 MG tablet Take 0.5 tablets by mouth daily. 02/22/18  Yes Wendie Agreste, MD  tamsulosin (FLOMAX) 0.4 MG CAPS capsule Take 0.4 mg by mouth at bedtime.    Yes [provider]   Social History   Socioeconomic History  . Marital status: Married    Spouse name: Not on file  . Number of children: 2  . Years of education: Not on file  . Highest education level: Not on file  Occupational History  . Occupation: truck Education administrator: [employer]    Comment: retired  Scientific laboratory technician  . Financial resource strain: Not on file  . Food insecurity:    Worry: Not on file    Inability: Not on file  . Transportation needs:    Medical: Not on file    Non-medical: Not on file  Tobacco Use  . Smoking status: Current Every Day Smoker    Packs/day: 0.25    Years: 45.00    Pack years: 11.25    Types: Cigarettes  . Smokeless tobacco: Never Used  Substance and Sexual Activity  . Alcohol use: Yes    Comment: 04/08/2013 "haven't drank nothing in > 3 yr; before then I'd have at least 1 pint/day"  . Drug use: No  . Sexual activity: Yes  Lifestyle  . Physical activity:    Days per week: Not on file    Minutes per session: Not on file  . Stress: Not on file  Relationships  . Social connections:    Talks on phone: Not on file    Gets together: Not on file    Attends religious service: Not on file    Active member of club or organization: Not on file    Attends meetings of clubs or organizations: Not on file    Relationship status: Not on file  . Intimate partner violence:    Fear of  current or ex partner: Not on file    Emotionally abused: Not on file    Physically abused: Not on file    Forced sexual activity: Not on file  Other Topics Concern  . Not on file  Social History Narrative   Pt lives in 1 story home with his wife   Has 2 adult children   Highest level of education: GED & Trade school   Retired Media planner.    Review of Systems  Respiratory: Positive for cough.   Skin:       + Mole to L low back  Objective:   Physical Exam  Constitutional: He is oriented to person, place, and time. He appears well-developed and well-nourished.  HENT:  Head: Normocephalic and atraumatic.  Eyes: Pupils are equal, round, and reactive to light. EOM are normal.  Neck: No JVD present. Carotid bruit is not present.  Cardiovascular: Normal rate, regular rhythm and normal heart sounds.  No murmur heard. Pulmonary/Chest: Effort normal and breath sounds normal. He has no rales.  Musculoskeletal: He exhibits no edema.  Neurological: He is alert and oriented to person, place, and time.  Skin: Skin is warm and dry.  Hyperpigmented, slightly elevated lesion to L lower back ~6-7 mm. Firm center without surrounding induration.  Psychiatric: He has a normal mood and affect.  Vitals reviewed.  Vitals:   03/09/18 0836 03/09/18 0857  BP: (!) 166/52 128/60  Pulse: 62   Resp: 17   Temp: 98.6 F (37 C)   TempSrc: Oral   SpO2: 98%   Weight: 178 lb (80.7 kg)   Height: 5\' 9"  (1.753 m)       Assessment & Plan:  Phillip Jacquin. is a 77 y.o. male Hyperlipidemia, unspecified hyperlipidemia type - Plan: Lipid panel  - check lipids, then determine statin recommendation/ASCVD risk. Suspect some statin need with hx of DM.   Elevated serum creatinine - Plan: Basic metabolic panel  - borderline elevated on prior testing. Hx of HTN, DM as possible contributors. Denies nsaids.   - repeat testing, maintain hydration and BP control.   Nevus of lower back - Plan:  Ambulatory referral to Dermatology  - dermal cyst vs changing nevus. Refer to derm for eval/removal.   Essential hypertension  - stable on home readings and recheck. No change in regimen for now.   No orders of the defined types were placed in this encounter.  Patient Instructions   Keep a record of your blood pressures outside of the office and if running over 140/90, let me know so we change meds.   I will recheck the kidney test, but it was only borderline elevated.   Depending on cholesterol test, we can decide on statin medication and dose.   Bump on lower back may be a small skin cyst, but I will refer you to a dermatologist to have that evaluated further and possibly removed.   Recheck in next 3 months for diabetes and wellness exam.   IF you received an x-ray today, you will receive an invoice from [facility]. Please contact [facility] at [phone] with questions or concerns regarding your invoice.   IF you received labwork today, you will receive an invoice from [place]. Please contact [facility] at [phone] with questions or concerns regarding your invoice.   Our billing staff will not be able to assist you with questions regarding bills from these companies.  You will be contacted with the lab results as soon as they are available. The fastest way to get your results is to activate your My Chart account. Instructions are located on the last page of this paperwork. If you have not heard from [place] regarding the results in 2 weeks, please contact this office.       I personally performed the services described in this documentation, which was scribed in my presence. The recorded information has been reviewed and considered for accuracy and completeness, addended by me as needed, and agree with information above.  Signed,   [name], MD [facility] [place].  03/09/18 9:19  AM

## 2018-03-10 ENCOUNTER — Encounter: Payer: Self-pay | Admitting: Family Medicine

## 2018-03-10 ENCOUNTER — Ambulatory Visit: Payer: Self-pay | Admitting: Family Medicine

## 2018-03-10 ENCOUNTER — Telehealth: Payer: Self-pay | Admitting: Family Medicine

## 2018-03-10 ENCOUNTER — Other Ambulatory Visit: Payer: Self-pay

## 2018-03-10 VITALS — BP 128/60 | HR 67 | Temp 98.4°F | Resp 16 | Ht 67.0 in | Wt 178.6 lb

## 2018-03-10 DIAGNOSIS — I10 Essential (primary) hypertension: Secondary | ICD-10-CM

## 2018-03-10 DIAGNOSIS — F17200 Nicotine dependence, unspecified, uncomplicated: Secondary | ICD-10-CM

## 2018-03-10 DIAGNOSIS — Z024 Encounter for examination for driving license: Secondary | ICD-10-CM

## 2018-03-10 DIAGNOSIS — E119 Type 2 diabetes mellitus without complications: Secondary | ICD-10-CM

## 2018-03-10 MED ORDER — VARENICLINE TARTRATE 0.5 MG PO TABS: ORAL_TABLET | ORAL | 0 refills | Status: DC

## 2018-03-10 MED ORDER — VARENICLINE TARTRATE 1 MG PO TABS: 1.0000 mg | ORAL_TABLET | Freq: Two times a day (BID) | ORAL | 1 refills | Status: DC

## 2018-03-10 NOTE — Progress Notes (Signed)
By signing my name below, I, Phillip Maynard, attest that this documentation has been prepared under the direction and in the presence of Dr. Delman Cheadle. Electronically Signed: Baldwin [place], Scribe 03/10/2018 at 1:17 PM.  Subjective:    Patient ID: Phillip Maynard., male    DOB: Sep 14, 1940, 77 y.o.   MRN: [id number]   Chief Complaint  Patient presents with  . DOT PE   HPI Phillip Maynard. is a 77 y.o. male who presents to Primary Care at [facility] for his DOT physical exam. Since his BP and diabetes are ongoing managements, he is maintaining an annual DOT physical exam.   He will be seeing his eye doctor next week on 03/12/18. He has not had surgeries in the last year or health concerns that have newly developed in the past year. He notes normal and regular bowel movements, and denies difficulty urinating.   The pt notes that he is continuing to smoke and is beginning to cough a little bit more than he used to. He has not been screened for lung cancer yet, but would like to do this.   He denies any concern for hernias in the past.    Patient Active Problem List   Diagnosis Date Noted  . 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 Diverticulitis of colon 07/12/2016  . Severe sepsis(995.92) 04/05/2013  . Pyelonephritis 04/05/2013  . Hypertension 02/28/2013  . Type 2 diabetes mellitus ([place]) 02/28/2013  . Chronic hepatitis C ([place]) 02/14/2011  . Urinary (tract) obstruction 02/14/2011  . TOBACCO ABUSE 12/10/2007  . 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 Asthma    "when I was a boy" (04/08/2013)  . Bronchitis   . Colon polyps   . Colovesical fistula   . Diverticulosis   . GERD (gastroesophageal reflux disease)   . Hepatitis C    treated with injections  . High cholesterol   . Hypertension   . Type II diabetes mellitus (HCC)    hx of . No longer on medicine   Past Surgical History:  Procedure Laterality Date    . CYSTOSCOPY N/A 09/08/2016   Procedure: FIREFLY INJECTIONS;  Surgeon: Cleon Gustin, MD;  Location: [facility];  Service: Urology;  Laterality: N/A;  . INCISION AND DRAINAGE OF WOUND Right 1970's   "leg" (04/08/2013)  . LIVER BIOPSY  ~ 2011  . PROCTOSCOPY N/A 09/08/2016   Procedure: RIGID PROCTOSCOPY;  Surgeon: Michael Boston, MD;  Location: [facility];  Service: General;  Laterality: N/A;  . TONSILLECTOMY     Allergies  Allergen Reactions  . Penicillins Anaphylaxis and Other (See Comments)    Has patient had a PCN reaction causing immediate rash, facial/tongue/throat swelling, SOB or lightheadedness with hypotension: Yes Has patient had a PCN reaction causing severe rash involving mucus membranes or skin necrosis: No Has patient had a PCN reaction that required hospitalization No Has patient had a PCN reaction occurring within the last 10 years: No If all of the above answers are "NO", then may proceed with Cephalosporin use.  . Shellfish Allergy Anaphylaxis   Prior to Admission medications   Medication Sig Start Date End Date Taking? Authorizing Provider  finasteride (PROSCAR) 5 MG tablet Take 5 mg by mouth daily. 09/22/17  Yes [provider]  losartan-hydrochlorothiazide (HYZAAR) 100-12.5 MG tablet Take 0.5 tablets by mouth daily. 02/22/18  Yes Phillip Agreste, MD  tamsulosin (FLOMAX) 0.4 MG CAPS capsule Take 0.4 mg by  mouth at bedtime.    Yes [provider]  albuterol (PROVENTIL HFA;VENTOLIN HFA) 108 (90 Base) MCG/ACT inhaler Inhale 2 puffs into the lungs every 6 (six) hours as needed for wheezing or shortness of breath. Patient not taking: Reported on 03/10/2018 11/03/17   Phillip Hummingbird L, PA-C  ipratropium (ATROVENT) 0.06 % nasal spray Place 2 sprays into the nose 3 (three) times daily. Patient not taking: Reported on 03/10/2018 11/03/17   Phillip Bale, PA-C   Social History   Socioeconomic History  . Marital status: Married    Spouse name: Not on file  . Number of  children: 2  . Years of education: Not on file  . Highest education level: Not on file  Occupational History  . Occupation: truck Education administrator: [employer]    Comment: retired  Scientific laboratory technician  . Financial resource strain: Not on file  . Food insecurity:    Worry: Not on file    Inability: Not on file  . Transportation needs:    Medical: Not on file    Non-medical: Not on file  Tobacco Use  . Smoking status: Current Every Day Smoker    Packs/day: 0.25    Years: 45.00    Pack years: 11.25    Types: Cigarettes  . Smokeless tobacco: Never Used  Substance and Sexual Activity  . Alcohol use: Yes    Comment: 04/08/2013 "haven't drank nothing in > 3 yr; before then I'd have at least 1 pint/day"  . Drug use: No  . Sexual activity: Yes  Lifestyle  . Physical activity:    Days per week: Not on file    Minutes per session: Not on file  . Stress: Not on file  Relationships  . Social connections:    Talks on phone: Not on file    Gets together: Not on file    Attends religious service: Not on file    Active member of club or organization: Not on file    Attends meetings of clubs or organizations: Not on file    Relationship status: Not on file  . Intimate partner violence:    Fear of current or ex partner: Not on file    Emotionally abused: Not on file    Physically abused: Not on file    Forced sexual activity: Not on file  Other Topics Concern  . Not on file  Social History Narrative   Pt lives in 1 story home with his wife   Has 2 adult children   Highest level of education: GED & Trade school   Retired Media planner.    Depression screen [facility] 2/9 03/10/2018 03/09/2018 02/22/2018 05/02/2017 03/06/2017  Decreased Interest 0 0 0 0 0  Down, Depressed, Hopeless 0 0 0 0 0  PHQ - 2 Score 0 0 0 0 0     Review of Systems  Constitutional: Negative for activity change, appetite change, chills, diaphoresis, fatigue and fever.  HENT: Negative for facial swelling,  postnasal drip, rhinorrhea, sinus pressure, sinus pain and sneezing.   Respiratory: Positive for cough. Negative for chest tightness.   Cardiovascular: Negative for chest pain and palpitations.  Gastrointestinal: Negative for constipation, diarrhea, nausea and vomiting.  Skin: Negative for color change, pallor, rash and wound.  Neurological: Negative for dizziness, facial asymmetry, light-headedness, numbness and headaches.  Psychiatric/Behavioral: Negative for agitation, behavioral problems, confusion, decreased concentration, dysphoric mood and hallucinations. The patient is not nervous/anxious and is not hyperactive.  Objective:   Physical Exam  Constitutional: He is oriented to person, place, and time. He appears well-developed and well-nourished. No distress.  HENT:  Head: Normocephalic and atraumatic.  Right Ear: External ear normal.  Left Ear: External ear normal.  Nose: Nose normal.  Mouth/Throat: Oropharynx is clear and moist. No oropharyngeal exudate.  Eyes: Pupils are equal, round, and reactive to light. Conjunctivae and EOM are normal. Right eye exhibits no discharge. Left eye exhibits no discharge. No scleral icterus.  Neck: Normal range of motion. Neck supple. No JVD present. No tracheal deviation present. No thyromegaly present.  Cardiovascular: Normal rate and regular rhythm.   Distant heart sounds to auscultation, presumed secondary to COPD  Pulmonary/Chest: Effort normal. No respiratory distress. He has rales (Bibasilar rales largely cleared after repeated inhalations.). He exhibits no tenderness.  Abdominal: Soft. Bowel sounds are normal. He exhibits no distension. There is no tenderness. Hernia confirmed negative in the right inguinal area and confirmed negative in the left inguinal area.  Musculoskeletal: Normal range of motion. He exhibits no edema, tenderness or deformity.  1+ patellar DTR  Lymphadenopathy:    He has no cervical adenopathy.  Neurological: He  is alert and oriented to person, place, and time.  Skin: Skin is warm and dry. He is not diaphoretic.  Psychiatric: He has a normal mood and affect. His behavior is normal. Judgment and thought content normal.    Vitals:   03/10/18 1218 03/10/18 1343  BP: (!) 144/74 128/60  Pulse: 67   Resp: 16   Temp: 98.4 F (36.9 C)   TempSrc: Oral   SpO2: 97%   Weight: 178 lb 9.6 oz (81 kg)   Height: 5\' 7"  (1.702 m)    Results for orders placed or performed in visit on 03/09/18  Lipid panel  Result Value Ref Range   Cholesterol, Total 133 100 - 199 mg/dL   Triglycerides 55 0 - 149 mg/dL   HDL 49 >39 mg/dL   VLDL Cholesterol Cal 11 5 - 40 mg/dL   LDL Calculated 73 0 - 99 mg/dL   Chol/HDL Ratio 2.7 0.0 - 5.0 ratio  Basic metabolic panel  Result Value Ref Range   Glucose 111 (H) 65 - 99 mg/dL   BUN 11 8 - 27 mg/dL   Creatinine, Ser 0.93 0.76 - 1.27 mg/dL   GFR calc non Af Amer 80 >59 mL/min/1.73   GFR calc Af Amer 93 >59 mL/min/1.73   BUN/Creatinine Ratio 12 10 - 24   Sodium 137 134 - 144 mmol/Maynard   Potassium 4.5 3.5 - 5.2 mmol/Maynard   Chloride 100 96 - 106 mmol/Maynard   CO2 23 20 - 29 mmol/Maynard   Calcium 9.3 8.6 - 10.2 mg/dL       Assessment & Plan:    1. Encounter for CDL (commercial driving license) exam   2. Essential hypertension   3. Type 2 diabetes mellitus without complication, without long-term current use of insulin ([place])   4. TOBACCO ABUSE    Pt agrees to try Chantix and to go get screening lung CT  I personally performed the services described in this documentation, which was scribed in my presence. The recorded information has been reviewed and considered, and addended by me as needed.   Delman Cheadle, M.D.  Primary Care at [facility] [address] [place], [place] [address] [phone] phone [phone] fax  03/11/18 1:28 AM

## 2018-03-10 NOTE — Progress Notes (Signed)
Patient ID: Phillip Barrette., male   DOB: [birth year], 77 y.o.   MRN: [id number] Commercial Driver Medical Examination   Phillip Toral. is a 77 y.o. male who presents today for a commercial driver fitness determination physical exam. The patient reports no problems. The following portions of the patient's history were reviewed and updated as appropriate: allergies, current medications, past family history, past medical history, past social history, past surgical history and problem list. Review of Systems A comprehensive review of systems was negative.   Objective:    Vision:  Applicant can recognize and distinguish among traffic control signals and devices showing standard red, green, and amber colors.  Applicant meets visual acuity requirement only when wearing corrective lenses.  Monocular Vision?: No   Visual Acuity Screening   Right eye Left eye Both eyes  Without correction:     With correction: 20/20 20/30 20/25   Comments: COLOR VISION: 6/6 NORMAL.  Pheriphal vision 85 degrees both eyes.  Hearing: Hearing Screening Comments: Whisper test at 15 feet     BP 128/60   Pulse 67   Temp 98.4 F (36.9 C) (Oral)   Resp 16   Ht 5\' 7"  (1.702 m)   Wt 178 lb 9.6 oz (81 kg)   SpO2 97%   BMI 27.97 kg/m   General Appearance:    Alert, cooperative, no distress, appears stated age  Head:    Normocephalic, without obvious abnormality, atraumatic  Eyes:    PERRL, conjunctiva/corneas clear, EOM's intact, fundi    benign, both eyes       Ears:    Normal TM's and external ear canals, both ears  Nose:   Nares normal, septum midline, mucosa normal, no drainage    or sinus tenderness  Throat:   Lips, mucosa, and tongue normal; teeth and gums normal  Neck:   Supple, symmetrical, trachea midline, no adenopathy;       thyroid:  No enlargement/tenderness/nodules; no carotid   bruit or JVD  Back:     Symmetric, no curvature, ROM normal, no CVA tenderness  Lungs:     Bibasilar rales,  respirations unlabored  Chest wall:    No tenderness or deformity  Heart:    Regular rate and rhythm, heat sounds difficult to hear due to COPD pulmonary changes  Abdomen:     Soft, non-tender, bowel sounds active all four quadrants,    no masses, no organomegaly  Genitalia:    No inguinal hernias  Rectal:    deferred  Extremities:   Extremities normal, atraumatic, no cyanosis or edema  Pulses:   2+ and symmetric all extremities  Skin:   Skin color, texture, turgor normal, no rashes or lesions  Lymph nodes:   Cervical, supraclavicular, and axillary nodes normal  Neurologic:   CNII-XII intact. Normal strength, sensation   Throughout. 1+ patellar DTRs.    Labs: Lab Results  Component Value Date   SPECGRAV 1.015 03/10/2018   PROTEINUR NEGATIVE 03/10/2018   BILIRUBINUR NEGATIVE 03/10/2018   GLUCOSEU NEGATIVE 03/10/2018      Assessment:    Healthy male exam.  Meets standards, but periodic monitoring required due to HTN, DM.  Driver qualified only for 1 year.    Plan:    Medical examiners certificate completed and printed. Return as needed.

## 2018-03-10 NOTE — Telephone Encounter (Addendum)
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  Discussed smoking cessation and he is reluctantly ready to try a single dose of chantix - sent over starter pack and gave pt hard copy of continuation pack. Also reentered order for screening lung CT and encouraged pt to obtain - his PCP had discussed w/ him prior but he was unaware that it would not require out of pocket cost since it was an initial screening test so he would like to meet with smoking cessation NP at [facility] at get screening chest CT.

## 2018-03-10 NOTE — Patient Instructions (Addendum)
IF you received an x-ray today, you will receive an invoice from [facility]. Please contact [facility] at [phone] with questions or concerns regarding your invoice.   IF you received labwork today, you will receive an invoice from [facility]. Please contact [facility] at [phone] with questions or concerns regarding your invoice.   Our billing staff will not be able to assist you with questions regarding bills from these companies.  You will be contacted with the lab results as soon as they are available. The fastest way to get your results is to activate your My Chart account. Instructions are located on the last page of this paperwork. If you have not heard from [place] regarding the results in 2 weeks, please contact this office.     Smoking Tobacco Information Smoking tobacco will very likely harm your health. Tobacco contains a poisonous (toxic), colorless chemical called nicotine. Nicotine affects the brain and makes tobacco addictive. This change in your brain can make it hard to stop smoking. Tobacco also has other toxic chemicals that can hurt your body and raise your risk of many cancers. How can smoking tobacco affect me? Smoking tobacco can increase your chances of having serious health conditions, such as:  Cancer. Smoking is most commonly associated with lung cancer, but can lead to cancer in other parts of the body.  Chronic obstructive pulmonary disease (COPD). This is a long-term lung condition that makes it hard to breathe. It also gets worse over time.  High blood pressure (hypertension), heart disease, stroke, or heart attack.  Lung infections, such as pneumonia.  Cataracts. This is when the lenses in the eyes become clouded.  Digestive problems. This may include peptic ulcers, heartburn, and gastroesophageal reflux disease (GERD).  Oral health problems, such as gum disease and tooth loss.  Loss of taste and smell.  Smoking can affect  your appearance by causing:  Wrinkles.  Yellow or stained teeth, fingers, and fingernails.  Smoking tobacco can also affect your social life.  Many workplaces, [employer], hotels, and public places are tobacco-free. This means that you may experience challenges in finding places to smoke when away from home.  The cost of a smoking habit can be expensive. Expenses for someone who smokes come in two ways: ? You spend money on a regular basis to buy tobacco. ? Your health care costs in the long-term are higher if you smoke.  Tobacco smoke can also affect the health of those around you. Children of smokers have greater chances of: ? Sudden infant death syndrome (SIDS). ? Ear infections. ? Lung infections.  What lifestyle changes can be made?  Do not start smoking. Quit if you already do.  To quit smoking: ? Make a plan to quit smoking and commit yourself to it. Look for programs to help you and ask your health care provider for recommendations and ideas. ? Talk with your health care provider about using nicotine replacement medicines to help you quit. Medicine replacement medicines include gum, lozenges, patches, sprays, or pills. ? Do not replace cigarette smoking with electronic cigarettes, which are commonly called e-cigarettes. The safety of e-cigarettes is not known, and some may contain harmful chemicals. ? Avoid places, people, or situations that tempt you to smoke. ? If you try to quit but return to smoking, don't give up hope. It is very common for people to try a number of times before they fully succeed. When you feel ready again, give it another try.  Quitting smoking  might affect the way you eat as well as your weight. Be prepared to monitor your eating habits. Get support in planning and following a healthy diet.  Ask your health care provider about having regular tests (screenings) to check for cancer. This may include blood tests, imaging tests, and other  tests.  Exercise regularly. Consider taking walks, joining a gym, or doing yoga or exercise classes.  Develop skills to manage your stress. These skills include meditation. What are the benefits of quitting smoking? By quitting smoking, you may:  Lower your risk of getting cancer and other diseases caused by smoking.  Live longer.  Breathe better.  Lower your blood pressure and heart rate.  Stop your addiction to tobacco.  Stop creating secondhand smoke that hurts other people.  Improve your sense of taste and smell.  Look better over time, due to having fewer wrinkles and less staining.  What can happen if changes are not made? If you do not stop smoking, you may:  Get cancer and other diseases.  Develop COPD or other long-term (chronic) lung conditions.  Develop serious problems with your heart and blood vessels (cardiovascular system).  Need more tests to screen for problems caused by smoking.  Have higher, long-term healthcare costs from medicines or treatments related to smoking.  Continue to have worsening changes in your lungs, mouth, and nose.  Where to find support: To get support to quit smoking, consider:  Asking your health care provider for more information and resources.  Taking classes to learn more about quitting smoking.  Looking for local organizations that offer resources about quitting smoking.  Joining a support group for people who want to quit smoking in your local community.  Where to find more information: You may find more information about quitting smoking from:  HelpGuide.org: www.helpguide.org/articles/addictions/how-to-quit-smoking.htm  https://hall.com/: smokefree.gov  American Lung Association: www.lung.org  Contact a health care provider if:  You have problems breathing.  Your lips, nose, or fingers turn blue.  You have chest pain.  You are coughing up blood.  You feel faint or you pass out.  You have other noticeable  changes that cause you to worry. Summary  Smoking tobacco can negatively affect your health, the health of those around you, your finances, and your social life.  Do not start smoking. Quit if you already do. If you need help quitting, ask your health care provider.  Think about joining a support group for people who want to quit smoking in your local community. There are many effective programs that will help you to quit this behavior. This information is not intended to replace advice given to you by your health care provider. Make sure you discuss any questions you have with your health care provider. Document Released: 08/16/2016 Document Revised: 08/16/2016 Document Reviewed: 08/16/2016 Elsevier Interactive Patient Education  2018 [place] with Quitting Smoking Quitting smoking is a physical and mental challenge. You will face cravings, withdrawal symptoms, and temptation. Before quitting, work with your health care provider to make a plan that can help you cope. Preparation can help you quit and keep you from giving in. How can I cope with cravings? Cravings usually last for 5-10 minutes. If you get through it, the craving will pass. Consider taking the following actions to help you cope with cravings:  Keep your mouth busy: ? Chew sugar-free gum. ? Suck on hard candies or a straw. ? Brush your teeth.  Keep your hands and body busy: ? Immediately change  to a different activity when you feel a craving. ? Squeeze or play with a ball. ? Do an activity or a hobby, like making bead jewelry, practicing needlepoint, or working with wood. ? Mix up your normal routine. ? Take a short exercise break. Go for a quick walk or run up and down stairs. ? Spend time in public places where smoking is not allowed.  Focus on doing something kind or helpful for someone else.  Call a friend or family member to talk during a craving.  Join a support group.  Call a quit line, such as  1-800-QUIT-NOW.  Talk with your health care provider about medicines that might help you cope with cravings and make quitting easier for you.  How can I deal with withdrawal symptoms? Your body may experience negative effects as it tries to get used to not having nicotine in the system. These effects are called withdrawal symptoms. They may include:  Feeling hungrier than normal.  Trouble concentrating.  Irritability.  Trouble sleeping.  Feeling depressed.  Restlessness and agitation.  Craving a cigarette.  To manage withdrawal symptoms:  Avoid places, people, and activities that trigger your cravings.  Remember why you want to quit.  Get plenty of sleep.  Avoid coffee and other caffeinated drinks. These may worsen some of your symptoms.  How can I handle social situations? Social situations can be difficult when you are quitting smoking, especially in the first few weeks. To manage this, you can:  Avoid parties, bars, and other social situations where people might be smoking.  Avoid alcohol.  Leave right away if you have the urge to smoke.  Explain to your family and friends that you are quitting smoking. Ask for understanding and support.  Plan activities with friends or family where smoking is not an option.  What are some ways I can cope with stress? Wanting to smoke may cause stress, and stress can make you want to smoke. Find ways to manage your stress. Relaxation techniques can help. For example:  Breathe slowly and deeply, in through your nose and out through your mouth.  Listen to soothing, relaxing music.  Talk with a family member or friend about your stress.  Light a candle.  Soak in a bath or take a shower.  Think about a peaceful place.  What are some ways I can prevent weight gain? Be aware that many people gain weight after they quit smoking. However, not everyone does. To keep from gaining weight, have a plan in place before you quit and  stick to the plan after you quit. Your plan should include:  Having healthy snacks. When you have a craving, it may help to: ? Eat plain popcorn, crunchy carrots, celery, or other cut vegetables. ? Chew sugar-free gum.  Changing how you eat: ? Eat small portion sizes at meals. ? Eat 4-6 small meals throughout the day instead of 1-2 large meals a day. ? Be mindful when you eat. Do not watch television or do other things that might distract you as you eat.  Exercising regularly: ? Make time to exercise each day. If you do not have time for a long workout, do short bouts of exercise for 5-10 minutes several times a day. ? Do some form of strengthening exercise, like weight lifting, and some form of aerobic exercise, like running or swimming.  Drinking plenty of water or other low-calorie or no-calorie drinks. Drink 6-8 glasses of water daily, or as much as instructed by  your health care provider.  Summary  Quitting smoking is a physical and mental challenge. You will face cravings, withdrawal symptoms, and temptation to smoke again. Preparation can help you as you go through these challenges.  You can cope with cravings by keeping your mouth busy (such as by chewing gum), keeping your body and hands busy, and making calls to family, friends, or a helpline for people who want to quit smoking.  You can cope with withdrawal symptoms by avoiding places where people smoke, avoiding drinks with caffeine, and getting plenty of rest.  Ask your health care provider about the different ways to prevent weight gain, avoid stress, and handle social situations. This information is not intended to replace advice given to you by your health care provider. Make sure you discuss any questions you have with your health care provider. Document Released: 07/29/2016 Document Revised: 07/29/2016 Document Reviewed: 07/29/2016 Elsevier Interactive Patient Education  [employer].

## 2018-03-12 DIAGNOSIS — H18413 Arcus senilis, bilateral: Secondary | ICD-10-CM | POA: Diagnosis not present

## 2018-03-12 DIAGNOSIS — I10 Essential (primary) hypertension: Secondary | ICD-10-CM | POA: Diagnosis not present

## 2018-03-12 DIAGNOSIS — H11153 Pinguecula, bilateral: Secondary | ICD-10-CM | POA: Diagnosis not present

## 2018-03-12 DIAGNOSIS — H524 Presbyopia: Secondary | ICD-10-CM | POA: Diagnosis not present

## 2018-03-12 DIAGNOSIS — H52223 Regular astigmatism, bilateral: Secondary | ICD-10-CM | POA: Diagnosis not present

## 2018-03-12 DIAGNOSIS — E119 Type 2 diabetes mellitus without complications: Secondary | ICD-10-CM | POA: Diagnosis not present

## 2018-03-12 DIAGNOSIS — H5203 Hypermetropia, bilateral: Secondary | ICD-10-CM | POA: Diagnosis not present

## 2018-03-12 DIAGNOSIS — H25093 Other age-related incipient cataract, bilateral: Secondary | ICD-10-CM | POA: Diagnosis not present

## 2018-03-12 LAB — HM DIABETES EYE EXAM

## 2018-03-29 ENCOUNTER — Telehealth: Payer: Self-pay | Admitting: Family Medicine

## 2018-03-29 NOTE — Telephone Encounter (Signed)
Left a voicemail in regards to his medication (Chantix) has been approved by his insurance company and he can pick it up at his pharmacy.

## 2018-04-01 ENCOUNTER — Other Ambulatory Visit: Payer: Self-pay

## 2018-04-01 ENCOUNTER — Emergency Department (HOSPITAL_COMMUNITY): Payer: Medicare HMO

## 2018-04-01 ENCOUNTER — Emergency Department (HOSPITAL_COMMUNITY)
Admission: EM | Admit: 2018-04-01 | Discharge: 2018-04-01 | Disposition: A | Discharge status: Home / Self Care | Payer: Medicare HMO | Attending: Emergency Medicine | Admitting: Emergency Medicine

## 2018-04-01 DIAGNOSIS — Z79899 Other long term (current) drug therapy: Secondary | ICD-10-CM | POA: Diagnosis not present

## 2018-04-01 DIAGNOSIS — Y999 Unspecified external cause status: Secondary | ICD-10-CM | POA: Insufficient documentation

## 2018-04-01 DIAGNOSIS — Y929 Unspecified place or not applicable: Secondary | ICD-10-CM | POA: Insufficient documentation

## 2018-04-01 DIAGNOSIS — F17210 Nicotine dependence, cigarettes, uncomplicated: Secondary | ICD-10-CM | POA: Diagnosis not present

## 2018-04-01 DIAGNOSIS — W231XXA Caught, crushed, jammed, or pinched between stationary objects, initial encounter: Secondary | ICD-10-CM | POA: Diagnosis not present

## 2018-04-01 DIAGNOSIS — Y93E6 Activity, residential relocation: Secondary | ICD-10-CM | POA: Diagnosis not present

## 2018-04-01 DIAGNOSIS — S99922A Unspecified injury of left foot, initial encounter: Secondary | ICD-10-CM | POA: Diagnosis present

## 2018-04-01 DIAGNOSIS — I10 Essential (primary) hypertension: Secondary | ICD-10-CM | POA: Insufficient documentation

## 2018-04-01 DIAGNOSIS — J45909 Unspecified asthma, uncomplicated: Secondary | ICD-10-CM | POA: Diagnosis not present

## 2018-04-01 DIAGNOSIS — S92415A Nondisplaced fracture of proximal phalanx of left great toe, initial encounter for closed fracture: Secondary | ICD-10-CM | POA: Diagnosis not present

## 2018-04-01 NOTE — ED Notes (Signed)
Ortho paged to place post op shoe.

## 2018-04-01 NOTE — ED Notes (Signed)
Bed: WTR7 Expected date:  Expected time:  Means of arrival:  Comments: 

## 2018-04-01 NOTE — ED Triage Notes (Signed)
He c/o pain and swelling of bilat. distal feet. He cites lifting furniture, and "rested heavy furniture on my feet while I was moving it". Both great toes are edematous, the left much greater so than the right.

## 2018-04-01 NOTE — Discharge Instructions (Signed)
Wear the hard soled shoe for the first week and as needed following.  Use ice 3-4 times daily alternating between minutes on, 20 minutes off.  Keep your foot elevated whenever you are not walking on it.  Please follow-up with your doctor in 1 to 2 weeks for recheck.  Please return the emergency department if you develop any new or worsening symptoms.

## 2018-04-01 NOTE — ED Provider Notes (Signed)
[facility] DEPT Provider Note   CSN: [id number] Arrival date & time: 04/01/18  1504     History   Chief Complaint Chief Complaint  Patient presents with  . Foot Pain    HPI Phillip Maynard. is a 77 y.o. male with history of hypertension, diabetes, high cholesterol who presents with swelling to his left great toe and discoloration of his right great toenail after bringing a dresser down on his toes while moving yesterday.  He reports swelling and pain to his left great toe.  He denies pain to the right great toe and toenail.  He reports soaking his feet in hot water last night.  He did not take any medications.  He denies any pain elsewhere.  He was wearing sneakers at the time.  HPI  Past Medical History:  Diagnosis Date  . Asthma    "when I was a boy" (04/08/2013)  . Bronchitis   . Colon polyps   . Colovesical fistula   . Diverticulosis   . GERD (gastroesophageal reflux disease)   . Hepatitis C    treated with injections  . High cholesterol   . Hypertension   . Type II diabetes mellitus (HCC)    hx of . No longer on medicine    Patient Active Problem List   Diagnosis Date Noted  . 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 Diverticulitis of colon 07/12/2016  . Severe sepsis(995.92) 04/05/2013  . Pyelonephritis 04/05/2013  . Hypertension 02/28/2013  . Type 2 diabetes mellitus ([place]) 02/28/2013  . Chronic hepatitis C ([place]) 02/14/2011  . Urinary (tract) obstruction 02/14/2011  . TOBACCO ABUSE 12/10/2007  . 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 CYSTOSCOPY N/A 09/08/2016   Procedure: FIREFLY INJECTIONS;  Surgeon: Cleon Gustin, MD;  Location: [facility] ORS;  Service: Urology;  Laterality: N/A;  . INCISION AND DRAINAGE OF WOUND Right 1970's   "leg" (04/08/2013)  . LIVER BIOPSY  ~ 2011  . PROCTOSCOPY N/A 09/08/2016   Procedure: RIGID PROCTOSCOPY;  Surgeon:  Michael Boston, MD;  Location: [facility] ORS;  Service: General;  Laterality: N/A;  . TONSILLECTOMY          Home Medications    Prior to Admission medications   Medication Sig Start Date End Date Taking? Authorizing Provider  albuterol (PROVENTIL HFA;VENTOLIN HFA) 108 (90 Base) MCG/ACT inhaler Inhale 2 puffs into the lungs every 6 (six) hours as needed for wheezing or shortness of breath. Patient not taking: Reported on 03/10/2018 11/03/17   Mancel Bale, PA-C  finasteride (PROSCAR) 5 MG tablet Take 5 mg by mouth daily. 09/22/17   [provider]  ipratropium (ATROVENT) 0.06 % nasal spray Place 2 sprays into the nose 3 (three) times daily. Patient not taking: Reported on 03/10/2018 11/03/17   Mancel Bale, PA-C  losartan-hydrochlorothiazide (HYZAAR) 100-12.5 MG tablet Take 0.5 tablets by mouth daily. 02/22/18   Wendie Agreste, MD  tamsulosin (FLOMAX) 0.4 MG CAPS capsule Take 0.4 mg by mouth at bedtime.     [provider]  varenicline (CHANTIX) 0.5 MG tablet Take 1 tab po qd x 3d, then 1 tab po bid x 4d, then 2 tabs po bid 03/10/18   Shawnee Knapp, MD  varenicline (CHANTIX) 1 MG tablet Take 1 tablet (1 mg total) by mouth 2 (two) times daily. 04/10/18   Shawnee Knapp, MD    Family History Family History  Problem  Relation Age of Onset  . Asthma Mother   . Heart attack Mother     Social History Social History   Tobacco Use  . Smoking status: Current Every Day Smoker    Packs/day: 0.25    Years: 45.00    Pack years: 11.25    Types: Cigarettes  . Smokeless tobacco: Never Used  Substance Use Topics  . Alcohol use: Yes    Comment: 04/08/2013 "haven't drank nothing in > 3 yr; before then I'd have at least 1 pint/day"  . Drug use: No     Allergies   Penicillins and Shellfish allergy   Review of Systems Review of Systems  Cardiovascular: Negative for leg swelling.  Musculoskeletal: Positive for arthralgias and joint swelling.  Skin: Positive for color change.    Neurological: Negative for numbness.     Physical Exam Updated Vital Signs BP 138/60 (BP Location: Left Arm)   Pulse 87   Temp 98.1 F (36.7 C) (Oral)   Resp 18   SpO2 99%   Physical Exam  Constitutional: He appears well-developed and well-nourished. No distress.  HENT:  Head: Normocephalic and atraumatic.  Mouth/Throat: Oropharynx is clear and moist. No oropharyngeal exudate.  Eyes: Pupils are equal, round, and reactive to light. Conjunctivae are normal. Right eye exhibits no discharge. Left eye exhibits no discharge. No scleral icterus.  Neck: Normal range of motion.  Cardiovascular: Normal rate, regular rhythm, normal heart sounds and intact distal pulses. Exam reveals no gallop and no friction rub.  No murmur heard. Pulmonary/Chest: Effort normal and breath sounds normal. No stridor. No respiratory distress. He has no wheezes. He has no rales.  Musculoskeletal: He exhibits no edema.       Right ankle: Normal.       Left ankle: Normal.       Right lower leg: Normal.       Left lower leg: Normal.  Edema and mild tenderness noted to left great toe, nail intact No tenderness to the right great toe, mild edema noted with ecchymosis under toenail See photo  Neurological: He is alert. Coordination normal.  Skin: Skin is warm and dry. No rash noted. He is not diaphoretic. No pallor.  Psychiatric: He has a normal mood and affect.  Nursing note and vitals reviewed.      ED Treatments / Results  Labs (all labs ordered are listed, but only abnormal results are displayed) Labs Reviewed - No data to display  EKG None  Radiology Dg Foot Complete Left  Result Date: 04/01/2018 CLINICAL DATA:  Left great toe pain after accidentally resting a dresser on top of it. EXAM: LEFT FOOT - COMPLETE 3+ VIEW COMPARISON:  None. FINDINGS: Acute nondisplaced longitudinal fracture of the first proximal phalanx with extension into the first IP joint. Mild hallux valgus deformity with  associated first MTP joint degenerative changes. Bone mineralization is normal. Large plantar and Achilles enthesophytes. Soft tissues are unremarkable. Vascular calcifications. IMPRESSION: 1. Nondisplaced fracture of the first proximal phalanx extending into the IP joint. Electronically Signed   By: Titus Dubin M.D.   On: 04/01/2018 17:00    Procedures Procedures (including critical care time)  Medications Ordered in ED Medications - No data to display   Initial Impression / Assessment and Plan / ED Course  I have reviewed the triage vital signs and the nursing notes.  Pertinent labs & imaging results that were available during my care of the patient were reviewed by me and considered in my medical  decision making (see chart for details).     Patient found to have a nondisplaced fracture of the first proximal phalanx extending into the left IP joint on the left foot.  There is no bony tenderness or significant swelling to the right foot.  There is small hematoma under the right toenail, however there is no pain or tenderness.  Patient will be given postop shoe for comfort and advised to use ice and elevation.  Patient advised to follow-up with PCP for recheck.  Patient vitals stable throughout ED course and discharged in satisfactory condition. Patient also evaluated by Dr. Jeanell Sparrow who guided the patient's management and agrees with plan.  Final Clinical Impressions(s) / ED Diagnoses   Final diagnoses:  Nondisplaced fracture of proximal phalanx of left great toe, initial encounter for closed fracture    ED Discharge Orders    None       Frederica Kuster, PA-C 04/01/18 1726    Pattricia Boss, MD 04/01/18 1747

## 2018-04-22 ENCOUNTER — Encounter (HOSPITAL_COMMUNITY): Payer: Self-pay | Admitting: Emergency Medicine

## 2018-04-22 ENCOUNTER — Other Ambulatory Visit: Payer: Self-pay

## 2018-04-22 ENCOUNTER — Emergency Department (HOSPITAL_COMMUNITY)
Admission: EM | Admit: 2018-04-22 | Discharge: 2018-04-22 | Disposition: A | Discharge status: Home / Self Care | Payer: Medicare HMO | Attending: Emergency Medicine | Admitting: Emergency Medicine

## 2018-04-22 ENCOUNTER — Emergency Department (HOSPITAL_COMMUNITY): Payer: Medicare HMO

## 2018-04-22 DIAGNOSIS — E119 Type 2 diabetes mellitus without complications: Secondary | ICD-10-CM | POA: Insufficient documentation

## 2018-04-22 DIAGNOSIS — F17210 Nicotine dependence, cigarettes, uncomplicated: Secondary | ICD-10-CM | POA: Insufficient documentation

## 2018-04-22 DIAGNOSIS — Z79899 Other long term (current) drug therapy: Secondary | ICD-10-CM | POA: Diagnosis not present

## 2018-04-22 DIAGNOSIS — R05 Cough: Secondary | ICD-10-CM | POA: Diagnosis not present

## 2018-04-22 DIAGNOSIS — J40 Bronchitis, not specified as acute or chronic: Secondary | ICD-10-CM | POA: Diagnosis not present

## 2018-04-22 DIAGNOSIS — I10 Essential (primary) hypertension: Secondary | ICD-10-CM | POA: Diagnosis not present

## 2018-04-22 DIAGNOSIS — J42 Unspecified chronic bronchitis: Secondary | ICD-10-CM | POA: Diagnosis not present

## 2018-04-22 MED ORDER — ALBUTEROL SULFATE (2.5 MG/3ML) 0.083% IN NEBU
5.0000 mg | INHALATION_SOLUTION | Freq: Once | RESPIRATORY_TRACT | Status: AC
  Administered 2018-04-22: 5 mg via RESPIRATORY_TRACT
  Filled 2018-04-22: qty 6

## 2018-04-22 MED ORDER — BENZONATATE 100 MG PO CAPS: 100.0000 mg | ORAL_CAPSULE | Freq: Three times a day (TID) | ORAL | 0 refills | Status: DC

## 2018-04-22 MED ORDER — ALBUTEROL SULFATE HFA 108 (90 BASE) MCG/ACT IN AERS
2.0000 | INHALATION_SPRAY | Freq: Once | RESPIRATORY_TRACT | Status: AC
  Administered 2018-04-22: 2 via RESPIRATORY_TRACT
  Filled 2018-04-22: qty 6.7

## 2018-04-22 MED ORDER — PREDNISONE 20 MG PO TABS: ORAL_TABLET | ORAL | 0 refills | Status: DC

## 2018-04-22 NOTE — ED Triage Notes (Signed)
Pt c/o productive cough x several days. Pt states he has tried OTC meds, but no relief.

## 2018-04-22 NOTE — ED Provider Notes (Signed)
[facility] DEPT Provider Note   CSN: [id number] Arrival date & time: 04/22/18  1141     History   Chief Complaint Chief Complaint  Patient presents with  . Cough    HPI Shaquon Gropp. is a 77 y.o. male.  The history is provided by the patient. No language interpreter was used.  Cough      77 year old male with history of asthma, bronchitis, hypertension, diabetes presenting for evaluation of cough.  Patient mention for the past 2 to 3 days he has had cough productive with clear sputum, sinus congestion, and mild throat irritation.  He also endorsed some subjective chills.  He denies any fever, headache, chest pain, shortness of breath or abdominal cramping.  He denies any hemoptysis.  He tries taking over-the-counter cough medication with minimal relief.  Past Medical History:  Diagnosis Date  . Asthma    "when I was a boy" (04/08/2013)  . Bronchitis   . Colon polyps   . Colovesical fistula   . Diverticulosis   . GERD (gastroesophageal reflux disease)   . Hepatitis C    treated with injections  . High cholesterol   . Hypertension   . Type II diabetes mellitus (HCC)    hx of . No longer on medicine    Patient Active Problem List   Diagnosis Date Noted  . 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 Diverticulitis of colon 07/12/2016  . Severe sepsis(995.92) 04/05/2013  . Pyelonephritis 04/05/2013  . Hypertension 02/28/2013  . Type 2 diabetes mellitus ([place]) 02/28/2013  . Chronic hepatitis C ([place]) 02/14/2011  . Urinary (tract) obstruction 02/14/2011  . TOBACCO ABUSE 12/10/2007  . 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 CYSTOSCOPY N/A 09/08/2016   Procedure: FIREFLY INJECTIONS;  Surgeon: Cleon Gustin, MD;  Location: [facility];  Service: Urology;  Laterality: N/A;  . INCISION AND DRAINAGE OF WOUND Right 1970's   "leg" (04/08/2013)  . LIVER BIOPSY   ~ 2011  . PROCTOSCOPY N/A 09/08/2016   Procedure: RIGID PROCTOSCOPY;  Surgeon: Michael Boston, MD;  Location: [facility];  Service: General;  Laterality: N/A;  . TONSILLECTOMY          Home Medications    Prior to Admission medications   Medication Sig Start Date End Date Taking? Authorizing Provider  albuterol (PROVENTIL HFA;VENTOLIN HFA) 108 (90 Base) MCG/ACT inhaler Inhale 2 puffs into the lungs every 6 (six) hours as needed for wheezing or shortness of breath. Patient not taking: Reported on 03/10/2018 11/03/17   Mancel Bale, PA-C  finasteride (PROSCAR) 5 MG tablet Take 5 mg by mouth daily. 09/22/17   [provider]  ipratropium (ATROVENT) 0.06 % nasal spray Place 2 sprays into the nose 3 (three) times daily. Patient not taking: Reported on 03/10/2018 11/03/17   Mancel Bale, PA-C  losartan-hydrochlorothiazide (HYZAAR) 100-12.5 MG tablet Take 0.5 tablets by mouth daily. 02/22/18   Wendie Agreste, MD  tamsulosin (FLOMAX) 0.4 MG CAPS capsule Take 0.4 mg by mouth at bedtime.     [provider]  varenicline (CHANTIX) 0.5 MG tablet Take 1 tab po qd x 3d, then 1 tab po bid x 4d, then 2 tabs po bid 03/10/18   Shawnee Knapp, MD  varenicline (CHANTIX) 1 MG tablet Take 1 tablet (1 mg total) by mouth 2 (two) times daily. 04/10/18   Shawnee Knapp, MD    Family History Family  History  Problem Relation Age of Onset  . Asthma Mother   . Heart attack Mother     Social History Social History   Tobacco Use  . Smoking status: Current Every Day Smoker    Packs/day: 0.25    Years: 45.00    Pack years: 11.25    Types: Cigarettes  . Smokeless tobacco: Never Used  Substance Use Topics  . Alcohol use: Yes    Comment: 04/08/2013 "haven't drank nothing in > 3 yr; before then I'd have at least 1 pint/day"  . Drug use: No     Allergies   Penicillins and Shellfish allergy   Review of Systems Review of Systems  Respiratory: Positive for cough.   All other systems reviewed and are  negative.    Physical Exam Updated Vital Signs BP (!) 142/62 (BP Location: Right Arm)   Pulse 67   Temp 98.7 F (37.1 C) (Oral)   Resp 18   SpO2 98%   Physical Exam  Constitutional: He appears well-developed and well-nourished. No distress.  HENT:  Head: Atraumatic.  Right Ear: External ear normal.  Left Ear: External ear normal.  Mouth/Throat: Oropharynx is clear and moist.  Eyes: Conjunctivae are normal.  Neck: Normal range of motion. Neck supple.  Cardiovascular: Normal rate and regular rhythm.  Pulmonary/Chest: Breath sounds normal.  Scattered rhonchi heard without wheezes rales.  Neurological: He is alert.  Skin: No rash noted.  Psychiatric: He has a normal mood and affect.     ED Treatments / Results  Labs (all labs ordered are listed, but only abnormal results are displayed) Labs Reviewed - No data to display  EKG None  Radiology Dg Chest 2 View  Result Date: 04/22/2018 CLINICAL DATA:  Productive cough for several days. EXAM: CHEST - 2 VIEW COMPARISON:  Two-view chest x-ray 01/19/2017. FINDINGS: The heart size is normal. Atherosclerotic calcifications are present in the arch. Lungs are hyperinflated. Pulmonary vascular congestion is present without frank edema. Linear atelectasis is present at the right base. The lungs are otherwise clear. No significant airspace consolidation is present. The visualized soft tissues and bony thorax are unremarkable. IMPRESSION: 1. Mild pulmonary vascular congestion without edema or effusion. 2. Linear atelectasis at the right base. 3. No focal airspace disease. 4. Chronic hyperinflation of both lungs again noted. Electronically Signed   By: San Morelle M.D.   On: 04/22/2018 13:36    Procedures Procedures (including critical care time)  Medications Ordered in ED Medications  albuterol (PROVENTIL) (2.5 MG/3ML) 0.083% nebulizer solution 5 mg (5 mg Nebulization Given 04/22/18 1211)     Initial Impression / Assessment and  Plan / ED Course  I have reviewed the triage vital signs and the nursing notes.  Pertinent labs & imaging results that were available during my care of the patient were reviewed by me and considered in my medical decision making (see chart for details).     BP (!) 142/62 (BP Location: Right Arm)   Pulse 67   Temp 98.7 F (37.1 C) (Oral)   Resp 18   SpO2 98%    Final Clinical Impressions(s) / ED Diagnoses   Final diagnoses:  Bronchitis    ED Discharge Orders         Ordered    predniSONE (DELTASONE) 20 MG tablet     04/22/18 1530    benzonatate (TESSALON) 100 MG capsule  Every 8 hours     04/22/18 1530         2:01  PM Patient here with cough and cold symptoms.  Rhonchorous on exam, suspect bronchitis.  Chest x-ray without evidence of focal infiltrate concerning for pneumonia.  Will provide symptomatically treatment.  Care discussed with DR. Rees  2:49 PM Pt currently receiving duonebs. Felt much better.  STable for discharge with treatment for bronchitis.     Domenic Moras, PA-C 04/22/18 1531    Quintella Reichert, MD 05/01/18 [phone]

## 2018-04-22 NOTE — ED Notes (Signed)
Patient transported to X-ray 

## 2018-04-27 ENCOUNTER — Other Ambulatory Visit: Payer: Self-pay

## 2018-04-27 ENCOUNTER — Ambulatory Visit (INDEPENDENT_AMBULATORY_CARE_PROVIDER_SITE_OTHER): Payer: Medicare HMO | Admitting: Family Medicine

## 2018-04-27 ENCOUNTER — Ambulatory Visit (INDEPENDENT_AMBULATORY_CARE_PROVIDER_SITE_OTHER): Payer: Medicare HMO

## 2018-04-27 ENCOUNTER — Encounter: Payer: Self-pay | Admitting: Family Medicine

## 2018-04-27 VITALS — BP 140/66 | HR 61 | Temp 98.4°F | Ht 67.0 in | Wt 175.0 lb

## 2018-04-27 DIAGNOSIS — R059 Cough, unspecified: Secondary | ICD-10-CM

## 2018-04-27 DIAGNOSIS — J22 Unspecified acute lower respiratory infection: Secondary | ICD-10-CM | POA: Diagnosis not present

## 2018-04-27 DIAGNOSIS — R062 Wheezing: Secondary | ICD-10-CM

## 2018-04-27 DIAGNOSIS — R05 Cough: Secondary | ICD-10-CM

## 2018-04-27 MED ORDER — ALBUTEROL SULFATE (2.5 MG/3ML) 0.083% IN NEBU
2.5000 mg | INHALATION_SOLUTION | Freq: Once | RESPIRATORY_TRACT | Status: AC
  Administered 2018-04-27: 2.5 mg via RESPIRATORY_TRACT

## 2018-04-27 MED ORDER — PREDNISONE 20 MG PO TABS: 40.0000 mg | ORAL_TABLET | Freq: Every day | ORAL | 0 refills | Status: DC

## 2018-04-27 MED ORDER — DOXYCYCLINE HYCLATE 100 MG PO TABS: 100.0000 mg | ORAL_TABLET | Freq: Two times a day (BID) | ORAL | 0 refills | Status: DC

## 2018-04-27 MED ORDER — IPRATROPIUM BROMIDE 0.02 % IN SOLN
0.5000 mg | Freq: Once | RESPIRATORY_TRACT | Status: AC
  Administered 2018-04-27: 0.5 mg via RESPIRATORY_TRACT

## 2018-04-27 MED ORDER — BENZONATATE 100 MG PO CAPS: 100.0000 mg | ORAL_CAPSULE | Freq: Three times a day (TID) | ORAL | 0 refills | Status: DC

## 2018-04-27 NOTE — Progress Notes (Signed)
Subjective:  By signing my name below, I, Phillip Maynard, attest that this documentation has been prepared under the direction and in the presence of Phillip Ray, MD. Electronically Signed: [name], [place]. 04/27/2018 , 6:04 PM .  Patient was seen in Room 3 .   Patient ID: [name], male    DOB: November 25, 1940, 77 y.o.   MRN: [id number] Chief Complaint  Patient presents with  . Cough    went to the er on Sunday for cough, given the tessalon, atrovent and deltasone. still has a cough   HPI Phillip Maynard. is a 77 y.o. male  Here for hospital follow up from ER visit 5 days ago. Patient was seen for 2-3 days history of cough, sinus congestion and chills. ER note reviewed, he was afebrile with O2 Sat 92% and rhonchi heard. He had chest xray done showing mild pulmonary vascular congestion without edema/effusion, linear ATX right base, and hyperinflation. He was treated with duo-nebs with improvement. He was sent home on tessalon and prednisone for bronchitis, as well as Proventil HFA. His prednisone taper was 60 mg first day, and then followed by 40 mg for 4 days, which he finished yesterday. He has a history of diabetes, HTN and tobacco use.   Patient states he did well on his medication from the hospital, but cough worsened last night. He's also been coughing up discolored yellow mucus over the past week. He noticed wheezing starting last night; although, his wife has noticed him wheezing most nights. He's been using his inhaler about 2-3 times a day. He also mentions having night sweats but no fevers. He denies any difficulty eating foods or drinking fluids. He states he's had similar illness a long time ago, but not in the past 5-6 years. His mentions having similar illness, and was treated with antibiotics.   Patient Active Problem List   Diagnosis Date Noted  . 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 Diverticulitis of colon 07/12/2016  .  Severe sepsis(995.92) 04/05/2013  . Pyelonephritis 04/05/2013  . Hypertension 02/28/2013  . Type 2 diabetes mellitus ([place]) 02/28/2013  . Chronic hepatitis C ([place]) 02/14/2011  . Urinary (tract) obstruction 02/14/2011  . TOBACCO ABUSE 12/10/2007  . 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 Asthma    "when I was a boy" (04/08/2013)  . Bronchitis   . Colon polyps   . Colovesical fistula   . Diverticulosis   . GERD (gastroesophageal reflux disease)   . Hepatitis C    treated with injections  . High cholesterol   . Hypertension   . Type II diabetes mellitus ([facility])    hx of . No longer on medicine   Past Surgical History:  Procedure Laterality Date  . CYSTOSCOPY N/A 09/08/2016   Procedure: FIREFLY INJECTIONS;  Surgeon: Cleon Gustin, MD;  Location: [facility];  Service: Urology;  Laterality: N/A;  . INCISION AND DRAINAGE OF WOUND Right 1970's   "leg" (04/08/2013)  . LIVER BIOPSY  ~ 2011  . PROCTOSCOPY N/A 09/08/2016   Procedure: RIGID PROCTOSCOPY;  Surgeon: Michael Boston, MD;  Location: [facility];  Service: General;  Laterality: N/A;  . TONSILLECTOMY     Allergies  Allergen Reactions  . Penicillins Anaphylaxis and Other (See Comments)    Has patient had a PCN reaction causing immediate rash, facial/tongue/throat swelling, SOB or lightheadedness with hypotension: Yes Has patient had a PCN reaction causing severe rash involving mucus  membranes or skin necrosis: No Has patient had a PCN reaction that required hospitalization No Has patient had a PCN reaction occurring within the last 10 years: No If all of the above answers are "NO", then may proceed with Cephalosporin use.  . Shellfish Allergy Anaphylaxis   Prior to Admission medications   Medication Sig Start Date End Date Taking? Authorizing Provider  albuterol (PROVENTIL HFA;VENTOLIN HFA) 108 (90 Base) MCG/ACT inhaler Inhale 2 puffs into the lungs every 6 (six) hours as needed for wheezing or  shortness of breath. Patient not taking: Reported on 03/10/2018 11/03/17   Mancel Bale, PA-C  benzonatate (TESSALON) 100 MG capsule Take 1 capsule (100 mg total) by mouth every 8 (eight) hours. 04/22/18   Domenic Moras, PA-C  finasteride (PROSCAR) 5 MG tablet Take 5 mg by mouth daily. 09/22/17   [provider]  ipratropium (ATROVENT) 0.06 % nasal spray Place 2 sprays into the nose 3 (three) times daily. Patient not taking: Reported on 03/10/2018 11/03/17   Mancel Bale, PA-C  losartan-hydrochlorothiazide (HYZAAR) 100-12.5 MG tablet Take 0.5 tablets by mouth daily. 02/22/18   Wendie Agreste, MD  predniSONE (DELTASONE) 20 MG tablet 3 tabs po day one, then 2 tabs daily x 4 days 04/22/18   Domenic Moras, PA-C  tamsulosin (FLOMAX) 0.4 MG CAPS capsule Take 0.4 mg by mouth at bedtime.     [provider]  varenicline (CHANTIX) 0.5 MG tablet Take 1 tab po qd x 3d, then 1 tab po bid x 4d, then 2 tabs po bid 03/10/18   Shawnee Knapp, MD  varenicline (CHANTIX) 1 MG tablet Take 1 tablet (1 mg total) by mouth 2 (two) times daily. 04/10/18   Shawnee Knapp, MD   Social History   Socioeconomic History  . Marital status: Married    Spouse name: Not on file  . Number of children: 2  . Years of education: Not on file  . Highest education level: Not on file  Occupational History  . Occupation: truck Education administrator: [employer]    Comment: retired  Scientific laboratory technician  . Financial resource strain: Not on file  . Food insecurity:    Worry: Not on file    Inability: Not on file  . Transportation needs:    Medical: Not on file    Non-medical: Not on file  Tobacco Use  . Smoking status: Current Every Day Smoker    Packs/day: 0.25    Years: 45.00    Pack years: 11.25    Types: Cigarettes  . Smokeless tobacco: Never Used  Substance and Sexual Activity  . Alcohol use: Yes    Comment: 04/08/2013 "haven't drank nothing in > 3 yr; before then I'd have at least 1 pint/day"  . Drug use: No  . Sexual  activity: Yes  Lifestyle  . Physical activity:    Days per week: Not on file    Minutes per session: Not on file  . Stress: Not on file  Relationships  . Social connections:    Talks on phone: Not on file    Gets together: Not on file    Attends religious service: Not on file    Active member of club or organization: Not on file    Attends meetings of clubs or organizations: Not on file    Relationship status: Not on file  . Intimate partner violence:    Fear of current or ex partner: Not on file  Emotionally abused: Not on file    Physically abused: Not on file    Forced sexual activity: Not on file  Other Topics Concern  . Not on file  Social History Narrative   Pt lives in 1 story home with his wife   Has 2 adult children   Highest level of education: GED & Trade school   Retired Media planner.    Review of Systems  Constitutional: Positive for diaphoresis (night sweats). Negative for appetite change, chills, fatigue, fever and unexpected weight change.  HENT: Positive for congestion.   Eyes: Negative for visual disturbance.  Respiratory: Positive for cough and wheezing. Negative for chest tightness and shortness of breath.   Cardiovascular: Negative for chest pain, palpitations and leg swelling.  Gastrointestinal: Negative for abdominal pain and Maynard in stool.  Neurological: Negative for dizziness, light-headedness and headaches.       Objective:   Physical Exam  Constitutional: He is oriented to person, place, and time. He appears well-developed and well-nourished. No distress.  HENT:  Head: Normocephalic and atraumatic.  Right Ear: Tympanic membrane, external ear and ear canal normal.  Left Ear: Tympanic membrane, external ear and ear canal normal.  Nose: No rhinorrhea.  Mouth/Throat: Oropharynx is clear and moist and mucous membranes are normal. No oropharyngeal exudate or posterior oropharyngeal erythema.  Eyes: Pupils are equal, round, and reactive  to light. Conjunctivae and EOM are normal.  Neck: Neck supple.  Cardiovascular: Normal rate, regular rhythm, normal heart sounds and intact distal pulses. Exam reveals no gallop.  No murmur heard. Pulmonary/Chest: Effort normal. No respiratory distress. He has wheezes (diffused end expiratory). He has no rhonchi. He has no rales.  Abdominal: Soft. There is no tenderness.  Musculoskeletal: Normal range of motion. He exhibits no edema (no appreciable edema in lower extremities).  Lymphadenopathy:    He has no cervical adenopathy.  Neurological: He is alert and oriented to person, place, and time.  Skin: Skin is warm and dry. No rash noted.  Psychiatric: He has a normal mood and affect. His behavior is normal.  Nursing note and vitals reviewed.   Vitals:   04/27/18 1714  BP: 140/66  Pulse: 61  Temp: 98.4 F (36.9 C)  TempSrc: Oral  SpO2: 98%  Weight: 175 lb (79.4 kg)  Height: 5\' 7"  (1.702 m)   Dg Chest 2 View  Result Date: 04/27/2018 CLINICAL DATA:  Cough and wheezing EXAM: CHEST - 2 VIEW COMPARISON:  04/22/2018, 01/19/2017 FINDINGS: No focal opacity or pleural effusion. Chronic bronchitic changes. Normal vascularity. Normal heart size. No pneumothorax. IMPRESSION: No active cardiopulmonary disease.  Mild bronchitic changes Electronically Signed   By: Donavan Foil M.D.   On: 04/27/2018 18:12   [6:46 PM] After neb treatment, increased air flow, normal effort and no distress; minimal faint end expiratory wheezing     Assessment & Plan:    Angel Weedon. is a 77 y.o. male Cough - Plan: DG Chest 2 View, albuterol (PROVENTIL) (2.5 MG/3ML) 0.083% nebulizer solution 2.5 mg, ipratropium (ATROVENT) nebulizer solution 0.5 mg, benzonatate (TESSALON) 100 MG capsule  Wheezing - Plan: albuterol (PROVENTIL) (2.5 MG/3ML) 0.083% nebulizer solution 2.5 mg, ipratropium (ATROVENT) nebulizer solution 0.5 mg, predniSONE (DELTASONE) 20 MG tablet  LRTI (lower respiratory tract infection) - Plan:  benzonatate (TESSALON) 100 MG capsule, predniSONE (DELTASONE) 20 MG tablet, doxycycline (VIBRA-TABS) 100 MG tablet  Possible asthmatic bronchitis versus COPD with tobacco use history.  Improved but recurrence off prednisone.    -  Extend prednisone for 2 additional days, nebulizer as above improved symptoms in office, correct use of albuterol at home discussed.   - Add doxycycline given discolored mucus production.    -Tessalon Perles 3 times daily as needed   -RTC/ER precautions given  Meds ordered this encounter  Medications  . albuterol (PROVENTIL) (2.5 MG/3ML) 0.083% nebulizer solution 2.5 mg  . ipratropium (ATROVENT) nebulizer solution 0.5 mg  . benzonatate (TESSALON) 100 MG capsule    Sig: Take 1 capsule (100 mg total) by mouth every 8 (eight) hours.    Dispense:  21 capsule    Refill:  0  . predniSONE (DELTASONE) 20 MG tablet    Sig: Take 2 tablets (40 mg total) by mouth daily with breakfast.    Dispense:  6 tablet    Refill:  0  . doxycycline (VIBRA-TABS) 100 MG tablet    Sig: Take 1 tablet (100 mg total) by mouth 2 (two) times daily.    Dispense:  20 tablet    Refill:  0   Patient Instructions   Start antibiotic doxycycline twice per day, restart prednisone 2 pills/day for the next 3 days.  Albuterol inhaler 1 to 2 puffs every 4-6 hours if needed for shortness of breath or wheezing. Tessalon Perls up to 3 times per day if needed for cough.  If you are not continuing to improve into next week or any worsening of symptoms, return for recheck.  Return to the clinic or go to the nearest emergency room if any of your symptoms worsen or new symptoms occur.   Acute Bronchitis, Adult Acute bronchitis is sudden (acute) swelling of the air tubes (bronchi) in the lungs. Acute bronchitis causes these tubes to fill with mucus, which can make it hard to breathe. It can also cause coughing or wheezing. In adults, acute bronchitis usually goes away within 2 weeks. A cough caused by  bronchitis may last up to 3 weeks. Smoking, allergies, and asthma can make the condition worse. Repeated episodes of bronchitis may cause further lung problems, such as chronic obstructive pulmonary disease (COPD). What are the causes? This condition can be caused by germs and by substances that irritate the lungs, including:  Cold and flu viruses. This condition is most often caused by the same virus that causes a cold.  Bacteria.  Exposure to tobacco smoke, dust, fumes, and air pollution.  What increases the risk? This condition is more likely to develop in people who:  Have close contact with someone with acute bronchitis.  Are exposed to lung irritants, such as tobacco smoke, dust, fumes, and vapors.  Have a weak immune system.  Have a respiratory condition such as asthma.  What are the signs or symptoms? Symptoms of this condition include:  A cough.  Coughing up clear, yellow, or green mucus.  Wheezing.  Chest congestion.  Shortness of breath.  A fever.  Body aches.  Chills.  A sore throat.  How is this diagnosed? This condition is usually diagnosed with a physical exam. During the exam, your health care provider may order tests, such as chest X-rays, to rule out other conditions. He or she may also:  Test a sample of your mucus for bacterial infection.  Check the level of oxygen in your Maynard. This is done to check for pneumonia.  Do a chest X-Maynard or lung function testing to rule out pneumonia and other conditions.  Perform Maynard tests.  Your health care provider will also ask about your symptoms and  medical history. How is this treated? Most cases of acute bronchitis clear up over time without treatment. Your health care provider may recommend:  Drinking more fluids. Drinking more makes your mucus thinner, which may make it easier to breathe.  Taking a medicine for a fever or cough.  Taking an antibiotic medicine.  Using an inhaler to help improve  shortness of breath and to control a cough.  Using a cool mist vaporizer or humidifier to make it easier to breathe.  Follow these instructions at home: Medicines  Take over-the-counter and prescription medicines only as told by your health care provider.  If you were prescribed an antibiotic, take it as told by your health care provider. Do not stop taking the antibiotic even if you start to feel better. General instructions  Get plenty of rest.  Drink enough fluids to keep your urine clear or pale yellow.  Avoid smoking and secondhand smoke. Exposure to cigarette smoke or irritating chemicals will make bronchitis worse. If you smoke and you need help quitting, ask your health care provider. Quitting smoking will help your lungs heal faster.  Use an inhaler, cool mist vaporizer, or humidifier as told by your health care provider.  Keep all follow-up visits as told by your health care provider. This is important. How is this prevented? To lower your risk of getting this condition again:  Wash your hands often with soap and water. If soap and water are not available, use hand sanitizer.  Avoid contact with people who have cold symptoms.  Try not to touch your hands to your mouth, nose, or eyes.  Make sure to get the flu shot every year.  Contact a health care provider if:  Your symptoms do not improve in 2 weeks of treatment. Get help right away if:  You cough up Maynard.  You have chest pain.  You have severe shortness of breath.  You become dehydrated.  You faint or keep feeling like you are going to faint.  You keep vomiting.  You have a severe headache.  Your fever or chills gets worse. This information is not intended to replace advice given to you by your health care provider. Make sure you discuss any questions you have with your health care provider. Document Released: 09/08/2004 Document Revised: 02/24/2016 Document Reviewed: 01/20/2016 Elsevier Interactive  Patient Education  2018 [employer].  Bronchospasm, Adult Bronchospasm is a tightening of the airways going into the lungs. During an episode, it may be harder to breathe. You may cough, and you may make a whistling sound when you breathe (wheeze). This condition often affects people with asthma. What are the causes? This condition is caused by swelling and irritation in the airways. It can be triggered by:  An infection (common).  Seasonal allergies.  An allergic reaction.  Exercise.  Irritants. These include pollution, cigarette smoke, strong odors, aerosol sprays, and paint fumes.  Weather changes. Winds increase molds and pollens in the air. Cold air may cause swelling.  Stress and emotional upset.  What are the signs or symptoms? Symptoms of this condition include:  Wheezing. If the episode was triggered by an allergy, wheezing may start right away or hours later.  Nighttime coughing.  Frequent or severe coughing with a simple cold.  Chest tightness.  Shortness of breath.  Decreased ability to exercise.  How is this diagnosed? This condition is usually diagnosed with a review of your medical history and a physical exam. Tests, such as lung function tests, are  sometimes done to look for other conditions. The need for a chest X-Maynard depends on where the wheezing occurs and whether it is the first time you have wheezed. How is this treated? This condition may be treated with:  Inhaled medicines. These open up the airways and help you breathe. They can be taken with an inhaler or a nebulizer device.  Corticosteroid medicines. These may be given for severe bronchospasm, usually when it is associated with asthma.  Avoiding triggers, such as irritants, infection, or allergies.  Follow these instructions at home: Medicines  Take over-the-counter and prescription medicines only as told by your health care provider.  If you need to use an inhaler or nebulizer to take  your medicine, ask your health care provider to explain how to use it correctly. If you were given a spacer, always use it with your inhaler. Lifestyle  Reduce the number of triggers in your home. To do this: ? Change your heating and air conditioning filter at least once a month. ? Limit your use of fireplaces and wood stoves. ? Do not smoke. Do not allow smoking in your home. ? Avoid using perfumes and fragrances. ? Get rid of pests, such as roaches and mice, and their droppings. ? Remove any mold from your home. ? Keep your house clean and dust free. Use unscented cleaning products. ? Replace carpet with wood, tile, or vinyl flooring. Carpet can trap dander and dust. ? Use allergy-proof pillows, mattress covers, and box spring covers. ? Wash bed sheets and blankets every week in hot water. Dry them in a dryer. ? Use blankets that are made of polyester or cotton. ? Wash your hands often. ? Do not allow pets in your bedroom.  Avoid breathing in cold air when you exercise. General instructions  Have a plan for seeking medical care. Know when to call your health care provider and local emergency services, and where to get emergency care.  Stay up to date on your immunizations.  When you have an episode of bronchospasm, stay calm. Try to relax and breathe more slowly.  If you have asthma, make sure you have an asthma action plan.  Keep all follow-up visits as told by your health care provider. This is important. Contact a health care provider if:  You have muscle aches.  You have chest pain.  The mucus that you cough up (sputum) changes from clear or white to yellow, green, gray, or bloody.  You have a fever.  Your sputum gets thicker. Get help right away if:  Your wheezing and coughing get worse, even after you take your prescribed medicines.  It gets even harder to breathe.  You develop severe chest pain. Summary  Bronchospasm is a tightening of the airways going  into the lungs.  During an episode of bronchospasm, you may have a harder time breathing. You may cough and make a whistling sound when you breathe (wheeze).  Avoid exposure to triggers such as smoke, dust, mold, animal dander, and fragrances.  When you have an episode of bronchospasm, stay calm. Try to relax and breathe more slowly. This information is not intended to replace advice given to you by your health care provider. Make sure you discuss any questions you have with your health care provider. Document Released: 08/04/2003 Document Revised: 07/28/2016 Document Reviewed: 07/28/2016 Elsevier Interactive Patient Education  2017 [employer].    If you have lab work done today you will be contacted with your lab results within the next  2 weeks.  If you have not heard from [place] then please contact us. The fastest way to get your results is to register for My Chart.   IF you received an x-Maynard today, you will receive an invoice from [facility]. Please contact [facility] at [phone] with questions or concerns regarding your invoice.   IF you received labwork today, you will receive an invoice from [facility]. Please contact [facility] at [phone] with questions or concerns regarding your invoice.   Our billing staff will not be able to assist you with questions regarding bills from these companies.  You will be contacted with the lab results as soon as they are available. The fastest way to get your results is to activate your My Chart account. Instructions are located on the last page of this paperwork. If you have not heard from [place] regarding the results in 2 weeks, please contact this office.      I personally performed the services described in this documentation, which was scribed in my presence. The recorded information has been reviewed and considered for accuracy and completeness, addended by me as needed, and agree with information above.  Signed,    Phillip Ray, MD [facility] [place].  05/01/18 4:23 PM

## 2018-04-27 NOTE — Patient Instructions (Addendum)
Start antibiotic doxycycline twice per day, restart prednisone 2 pills/day for the next 3 days.  Albuterol inhaler 1 to 2 puffs every 4-6 hours if needed for shortness of breath or wheezing. Tessalon Perls up to 3 times per day if needed for cough.  If you are not continuing to improve into next week or any worsening of symptoms, return for recheck.  Return to the clinic or go to the nearest emergency room if any of your symptoms worsen or new symptoms occur.   Acute Bronchitis, Adult Acute bronchitis is sudden (acute) swelling of the air tubes (bronchi) in the lungs. Acute bronchitis causes these tubes to fill with mucus, which can make it hard to breathe. It can also cause coughing or wheezing. In adults, acute bronchitis usually goes away within 2 weeks. A cough caused by bronchitis may last up to 3 weeks. Smoking, allergies, and asthma can make the condition worse. Repeated episodes of bronchitis may cause further lung problems, such as chronic obstructive pulmonary disease (COPD). What are the causes? This condition can be caused by germs and by substances that irritate the lungs, including:  Cold and flu viruses. This condition is most often caused by the same virus that causes a cold.  Bacteria.  Exposure to tobacco smoke, dust, fumes, and air pollution.  What increases the risk? This condition is more likely to develop in people who:  Have close contact with someone with acute bronchitis.  Are exposed to lung irritants, such as tobacco smoke, dust, fumes, and vapors.  Have a weak immune system.  Have a respiratory condition such as asthma.  What are the signs or symptoms? Symptoms of this condition include:  A cough.  Coughing up clear, yellow, or green mucus.  Wheezing.  Chest congestion.  Shortness of breath.  A fever.  Body aches.  Chills.  A sore throat.  How is this diagnosed? This condition is usually diagnosed with a physical exam. During the exam,  your health care provider may order tests, such as chest X-rays, to rule out other conditions. He or she may also:  Test a sample of your mucus for bacterial infection.  Check the level of oxygen in your blood. This is done to check for pneumonia.  Do a chest X-ray or lung function testing to rule out pneumonia and other conditions.  Perform blood tests.  Your health care provider will also ask about your symptoms and medical history. How is this treated? Most cases of acute bronchitis clear up over time without treatment. Your health care provider may recommend:  Drinking more fluids. Drinking more makes your mucus thinner, which may make it easier to breathe.  Taking a medicine for a fever or cough.  Taking an antibiotic medicine.  Using an inhaler to help improve shortness of breath and to control a cough.  Using a cool mist vaporizer or humidifier to make it easier to breathe.  Follow these instructions at home: Medicines  Take over-the-counter and prescription medicines only as told by your health care provider.  If you were prescribed an antibiotic, take it as told by your health care provider. Do not stop taking the antibiotic even if you start to feel better. General instructions  Get plenty of rest.  Drink enough fluids to keep your urine clear or pale yellow.  Avoid smoking and secondhand smoke. Exposure to cigarette smoke or irritating chemicals will make bronchitis worse. If you smoke and you need help quitting, ask your health care provider. Quitting  smoking will help your lungs heal faster.  Use an inhaler, cool mist vaporizer, or humidifier as told by your health care provider.  Keep all follow-up visits as told by your health care provider. This is important. How is this prevented? To lower your risk of getting this condition again:  Wash your hands often with soap and water. If soap and water are not available, use hand sanitizer.  Avoid contact with  people who have cold symptoms.  Try not to touch your hands to your mouth, nose, or eyes.  Make sure to get the flu shot every year.  Contact a health care provider if:  Your symptoms do not improve in 2 weeks of treatment. Get help right away if:  You cough up blood.  You have chest pain.  You have severe shortness of breath.  You become dehydrated.  You faint or keep feeling like you are going to faint.  You keep vomiting.  You have a severe headache.  Your fever or chills gets worse. This information is not intended to replace advice given to you by your health care provider. Make sure you discuss any questions you have with your health care provider. Document Released: 09/08/2004 Document Revised: 02/24/2016 Document Reviewed: 01/20/2016 Elsevier Interactive Patient Education  2018 [employer].  Bronchospasm, Adult Bronchospasm is a tightening of the airways going into the lungs. During an episode, it may be harder to breathe. You may cough, and you may make a whistling sound when you breathe (wheeze). This condition often affects people with asthma. What are the causes? This condition is caused by swelling and irritation in the airways. It can be triggered by:  An infection (common).  Seasonal allergies.  An allergic reaction.  Exercise.  Irritants. These include pollution, cigarette smoke, strong odors, aerosol sprays, and paint fumes.  Weather changes. Winds increase molds and pollens in the air. Cold air may cause swelling.  Stress and emotional upset.  What are the signs or symptoms? Symptoms of this condition include:  Wheezing. If the episode was triggered by an allergy, wheezing may start right away or hours later.  Nighttime coughing.  Frequent or severe coughing with a simple cold.  Chest tightness.  Shortness of breath.  Decreased ability to exercise.  How is this diagnosed? This condition is usually diagnosed with a review of your  medical history and a physical exam. Tests, such as lung function tests, are sometimes done to look for other conditions. The need for a chest X-ray depends on where the wheezing occurs and whether it is the first time you have wheezed. How is this treated? This condition may be treated with:  Inhaled medicines. These open up the airways and help you breathe. They can be taken with an inhaler or a nebulizer device.  Corticosteroid medicines. These may be given for severe bronchospasm, usually when it is associated with asthma.  Avoiding triggers, such as irritants, infection, or allergies.  Follow these instructions at home: Medicines  Take over-the-counter and prescription medicines only as told by your health care provider.  If you need to use an inhaler or nebulizer to take your medicine, ask your health care provider to explain how to use it correctly. If you were given a spacer, always use it with your inhaler. Lifestyle  Reduce the number of triggers in your home. To do this: ? Change your heating and air conditioning filter at least once a month. ? Limit your use of fireplaces and wood stoves. ?  Do not smoke. Do not allow smoking in your home. ? Avoid using perfumes and fragrances. ? Get rid of pests, such as roaches and mice, and their droppings. ? Remove any mold from your home. ? Keep your house clean and dust free. Use unscented cleaning products. ? Replace carpet with wood, tile, or vinyl flooring. Carpet can trap dander and dust. ? Use allergy-proof pillows, mattress covers, and box spring covers. ? Wash bed sheets and blankets every week in hot water. Dry them in a dryer. ? Use blankets that are made of polyester or cotton. ? Wash your hands often. ? Do not allow pets in your bedroom.  Avoid breathing in cold air when you exercise. General instructions  Have a plan for seeking medical care. Know when to call your health care provider and local emergency services, and  where to get emergency care.  Stay up to date on your immunizations.  When you have an episode of bronchospasm, stay calm. Try to relax and breathe more slowly.  If you have asthma, make sure you have an asthma action plan.  Keep all follow-up visits as told by your health care provider. This is important. Contact a health care provider if:  You have muscle aches.  You have chest pain.  The mucus that you cough up (sputum) changes from clear or white to yellow, green, gray, or bloody.  You have a fever.  Your sputum gets thicker. Get help right away if:  Your wheezing and coughing get worse, even after you take your prescribed medicines.  It gets even harder to breathe.  You develop severe chest pain. Summary  Bronchospasm is a tightening of the airways going into the lungs.  During an episode of bronchospasm, you may have a harder time breathing. You may cough and make a whistling sound when you breathe (wheeze).  Avoid exposure to triggers such as smoke, dust, mold, animal dander, and fragrances.  When you have an episode of bronchospasm, stay calm. Try to relax and breathe more slowly. This information is not intended to replace advice given to you by your health care provider. Make sure you discuss any questions you have with your health care provider. Document Released: 08/04/2003 Document Revised: 07/28/2016 Document Reviewed: 07/28/2016 Elsevier Interactive Patient Education  [employer].    If you have lab work done today you will be contacted with your lab results within the next 2 weeks.  If you have not heard from [place] then please contact us. The fastest way to get your results is to register for My Chart.   IF you received an x-ray today, you will receive an invoice from [facility]. Please contact [facility] at [phone] with questions or concerns regarding your invoice.   IF you received labwork today, you will receive an invoice  from [facility]. Please contact [facility] at [phone] with questions or concerns regarding your invoice.   Our billing staff will not be able to assist you with questions regarding bills from these companies.  You will be contacted with the lab results as soon as they are available. The fastest way to get your results is to activate your My Chart account. Instructions are located on the last page of this paperwork. If you have not heard from [place] regarding the results in 2 weeks, please contact this office.

## 2018-07-24 DIAGNOSIS — R350 Frequency of micturition: Secondary | ICD-10-CM | POA: Diagnosis not present

## 2018-07-24 DIAGNOSIS — N401 Enlarged prostate with lower urinary tract symptoms: Secondary | ICD-10-CM | POA: Diagnosis not present

## 2018-07-24 DIAGNOSIS — N5201 Erectile dysfunction due to arterial insufficiency: Secondary | ICD-10-CM | POA: Diagnosis not present

## 2018-08-22 ENCOUNTER — Telehealth: Payer: Self-pay | Admitting: Family Medicine

## 2018-08-22 NOTE — Telephone Encounter (Signed)
Copied from [place] [id number]. Topic: General - Other >> Aug 22, 2018  8:53 AM [name] wrote: Reason for CRM: [name] with [facility] called and wanted to let Dr Carlota Raspberry know that he did not show for his appointment or call so they will not be able to reschedule his appt

## 2018-09-05 ENCOUNTER — Encounter: Payer: Self-pay | Admitting: Emergency Medicine

## 2018-09-05 ENCOUNTER — Other Ambulatory Visit: Payer: Self-pay

## 2018-09-05 ENCOUNTER — Ambulatory Visit (INDEPENDENT_AMBULATORY_CARE_PROVIDER_SITE_OTHER): Payer: Medicare HMO | Admitting: Emergency Medicine

## 2018-09-05 VITALS — BP 153/67 | HR 63 | Temp 98.3°F | Resp 16 | Ht 67.0 in | Wt 173.0 lb

## 2018-09-05 DIAGNOSIS — M7918 Myalgia, other site: Secondary | ICD-10-CM | POA: Diagnosis not present

## 2018-09-05 DIAGNOSIS — Z23 Encounter for immunization: Secondary | ICD-10-CM | POA: Diagnosis not present

## 2018-09-05 DIAGNOSIS — S39012A Strain of muscle, fascia and tendon of lower back, initial encounter: Secondary | ICD-10-CM | POA: Diagnosis not present

## 2018-09-05 DIAGNOSIS — R109 Unspecified abdominal pain: Secondary | ICD-10-CM | POA: Diagnosis not present

## 2018-09-05 MED ORDER — TRAMADOL HCL 50 MG PO TABS: 50.0000 mg | ORAL_TABLET | Freq: Three times a day (TID) | ORAL | 0 refills | Status: DC | PRN

## 2018-09-05 NOTE — Patient Instructions (Addendum)
   If you have lab work done today you will be contacted with your lab results within the next 2 weeks.  If you have not heard from us then please contact us. The fastest way to get your results is to register for My Chart.   IF you received an x-ray today, you will receive an invoice from [facility]. Please contact [facility] at [phone] with questions or concerns regarding your invoice.   IF you received labwork today, you will receive an invoice from [facility]. Please contact [facility] at [phone] with questions or concerns regarding your invoice.   Our billing staff will not be able to assist you with questions regarding bills from these companies.  You will be contacted with the lab results as soon as they are available. The fastest way to get your results is to activate your My Chart account. Instructions are located on the last page of this paperwork. If you have not heard from us regarding the results in 2 weeks, please contact this office.     Acute Back Pain, Adult Acute back pain is sudden and usually short-lived. It is often caused by an injury to the muscles and tissues in the back. The injury may result from:  A muscle or ligament getting overstretched or torn (strained). Ligaments are tissues that connect bones to each other. Lifting something improperly can cause a back strain.  Wear and tear (degeneration) of the spinal disks. Spinal disks are circular tissue that provides cushioning between the bones of the spine (vertebrae).  Twisting motions, such as while playing sports or doing yard work.  A hit to the back.  Arthritis. You may have a physical exam, lab tests, and imaging tests to find the cause of your pain. Acute back pain usually goes away with rest and home care. Follow these instructions at home: Managing pain, stiffness, and swelling  Take over-the-counter and prescription medicines only as told by your health care  provider.  Your health care provider may recommend applying ice during the first 24-48 hours after your pain starts. To do this: ? Put ice in a plastic bag. ? Place a towel between your skin and the bag. ? Leave the ice on for 20 minutes, 2-3 times a day.  If directed, apply heat to the affected area as often as told by your health care provider. Use the heat source that your health care provider recommends, such as a moist heat pack or a heating pad. ? Place a towel between your skin and the heat source. ? Leave the heat on for 20-30 minutes. ? Remove the heat if your skin turns bright red. This is especially important if you are unable to feel pain, heat, or cold. You have a greater risk of getting burned. Activity   Do not stay in bed. Staying in bed for more than 1-2 days can delay your recovery.  Sit up and stand up straight. Avoid leaning forward when you sit, or hunching over when you stand. ? If you work at a desk, sit close to it so you do not need to lean over. Keep your chin tucked in. Keep your neck drawn back, and keep your elbows bent at a right angle. Your arms should look like the letter "L." ? Sit high and close to the steering wheel when you drive. Add lower back (lumbar) support to your car seat, if needed.  Take short walks on even surfaces as soon as you are able. Try   to increase the length of time you walk each day.  Do not sit, drive, or stand in one place for more than 30 minutes at a time. Sitting or standing for long periods of time can put stress on your back.  Do not drive or use heavy machinery while taking prescription pain medicine.  Use proper lifting techniques. When you bend and lift, use positions that put less stress on your back: ? Bend your knees. ? Keep the load close to your body. ? Avoid twisting.  Exercise regularly as told by your health care provider. Exercising helps your back heal faster and helps prevent back injuries by keeping muscles  strong and flexible.  Work with a physical therapist to make a safe exercise program, as recommended by your health care provider. Do any exercises as told by your physical therapist. Lifestyle  Maintain a healthy weight. Extra weight puts stress on your back and makes it difficult to have good posture.  Avoid activities or situations that make you feel anxious or stressed. Stress and anxiety increase muscle tension and can make back pain worse. Learn ways to manage anxiety and stress, such as through exercise. General instructions  Sleep on a firm mattress in a comfortable position. Try lying on your side with your knees slightly bent. If you lie on your back, put a pillow under your knees.  Follow your treatment plan as told by your health care provider. This may include: ? Cognitive or behavioral therapy. ? Acupuncture or massage therapy. ? Meditation or yoga. Contact a health care provider if:  You have pain that is not relieved with rest or medicine.  You have increasing pain going down into your legs or buttocks.  Your pain does not improve after 2 weeks.  You have pain at night.  You lose weight without trying.  You have a fever or chills. Get help right away if:  You develop new bowel or bladder control problems.  You have unusual weakness or numbness in your arms or legs.  You develop nausea or vomiting.  You develop abdominal pain.  You feel faint. Summary  Acute back pain is sudden and usually short-lived.  Use proper lifting techniques. When you bend and lift, use positions that put less stress on your back.  Take over-the-counter and prescription medicines and apply heat or ice as directed by your health care provider. This information is not intended to replace advice given to you by your health care provider. Make sure you discuss any questions you have with your health care provider. Document Released: 08/01/2005 Document Revised: 03/08/2018 Document  Reviewed: 03/15/2017 Elsevier Interactive Patient Education  2019 [employer]  

## 2018-09-05 NOTE — Addendum Note (Signed)
Addended by: Raymon Mutton on: 09/05/2018 04:29 PM   Modules accepted: Orders

## 2018-09-05 NOTE — Progress Notes (Signed)
Phillip Maynard. 78 y.o.   Chief Complaint  Patient presents with  . Back Pain    x 2 weeks per patient on the RIGHT side above the belt line    HISTORY OF PRESENT ILLNESS: This is a 78 y.o. male complaining of pain to the right flank area that started 2 weeks ago.  Constant sharp pain with no radiation and no associated symptoms.  Thinks this is secondary to physical activity he does at work.  Denies any urinary symptoms.  Denies abdominal pain.  HPI   Prior to Admission medications   Medication Sig Start Date End Date Taking? Authorizing Provider  finasteride (PROSCAR) 5 MG tablet Take 5 mg by mouth daily. 09/22/17  Yes [provider]  losartan-hydrochlorothiazide (HYZAAR) 100-12.5 MG tablet Take 0.5 tablets by mouth daily. 02/22/18  Yes Wendie Agreste, MD  tamsulosin (FLOMAX) 0.4 MG CAPS capsule Take 0.4 mg by mouth at bedtime.    Yes [provider]  albuterol (PROVENTIL HFA;VENTOLIN HFA) 108 (90 Base) MCG/ACT inhaler Inhale 2 puffs into the lungs every 6 (six) hours as needed for wheezing or shortness of breath. Patient not taking: Reported on 09/05/2018 11/03/17   Windell Hummingbird L, PA-C  ipratropium (ATROVENT) 0.06 % nasal spray Place 2 sprays into the nose 3 (three) times daily. Patient not taking: Reported on 09/05/2018 11/03/17   Mancel Bale, PA-C  varenicline (CHANTIX) 0.5 MG tablet Take 1 tab po qd x 3d, then 1 tab po bid x 4d, then 2 tabs po bid Patient not taking: Reported on 09/05/2018 03/10/18   Shawnee Knapp, MD  varenicline (CHANTIX) 1 MG tablet Take 1 tablet (1 mg total) by mouth 2 (two) times daily. Patient not taking: Reported on 09/05/2018 04/10/18   Shawnee Knapp, MD    Allergies  Allergen Reactions  . Penicillins Anaphylaxis and Other (See Comments)    Has patient had a PCN reaction causing immediate rash, facial/tongue/throat swelling, SOB or lightheadedness with hypotension: Yes Has patient had a PCN reaction causing severe rash involving mucus  membranes or skin necrosis: No Has patient had a PCN reaction that required hospitalization No Has patient had a PCN reaction occurring within the last 10 years: No If all of the above answers are "NO", then may proceed with Cephalosporin use.  Marland Kitchen Shellfish Allergy Anaphylaxis    Patient Active Problem List   Diagnosis Date Noted  . 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 Diverticulitis of colon 07/12/2016  . Severe sepsis(995.92) 04/05/2013  . Pyelonephritis 04/05/2013  . Hypertension 02/28/2013  . Type 2 diabetes mellitus ([place]) 02/28/2013  . Chronic hepatitis C ([place]) 02/14/2011  . Urinary (tract) obstruction 02/14/2011  . TOBACCO ABUSE 12/10/2007  . 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 Asthma    "when I was a boy" (04/08/2013)  . Bronchitis   . Colon polyps   . Colovesical fistula   . Diverticulosis   . GERD (gastroesophageal reflux disease)   . Hepatitis C    treated with injections  . High cholesterol   . Hypertension   . Type II diabetes mellitus ([facility])    hx of . No longer on medicine    Past Surgical History:  Procedure Laterality Date  . CYSTOSCOPY N/A 09/08/2016   Procedure: FIREFLY INJECTIONS;  Surgeon: Cleon Gustin, MD;  Location: [facility];  Service: Urology;  Laterality: N/A;  . INCISION AND DRAINAGE OF WOUND Right  1970's   "leg" (04/08/2013)  . LIVER BIOPSY  ~ 2011  . PROCTOSCOPY N/A 09/08/2016   Procedure: RIGID PROCTOSCOPY;  Surgeon: Michael Boston, MD;  Location: [facility];  Service: General;  Laterality: N/A;  . TONSILLECTOMY      Social History   Socioeconomic History  . Marital status: Married    Spouse name: Not on file  . Number of children: 2  . Years of education: Not on file  . Highest education level: Not on file  Occupational History  . Occupation: truck Education administrator: [employer]    Comment: retired  Scientific laboratory technician  . Financial resource strain: Not on file    . Food insecurity:    Worry: Not on file    Inability: Not on file  . Transportation needs:    Medical: Not on file    Non-medical: Not on file  Tobacco Use  . Smoking status: Current Every Day Smoker    Packs/day: 0.25    Years: 45.00    Pack years: 11.25    Types: Cigarettes  . Smokeless tobacco: Never Used  Substance and Sexual Activity  . Alcohol use: Yes    Comment: 04/08/2013 "haven't drank nothing in > 3 yr; before then I'd have at least 1 pint/day"  . Drug use: No  . Sexual activity: Yes  Lifestyle  . Physical activity:    Days per week: Not on file    Minutes per session: Not on file  . Stress: Not on file  Relationships  . Social connections:    Talks on phone: Not on file    Gets together: Not on file    Attends religious service: Not on file    Active member of club or organization: Not on file    Attends meetings of clubs or organizations: Not on file    Relationship status: Not on file  . Intimate partner violence:    Fear of current or ex partner: Not on file    Emotionally abused: Not on file    Physically abused: Not on file    Forced sexual activity: Not on file  Other Topics Concern  . Not on file  Social History Narrative   Pt lives in 1 story home with his wife   Has 2 adult children   Highest level of education: GED & Trade school   Retired Media planner.     Family History  Problem Relation Age of Onset  . Asthma Mother   . Heart attack Mother      Review of Systems  Constitutional: Negative.  Negative for chills and fever.  HENT: Negative.   Eyes: Negative.   Respiratory: Negative.  Negative for cough and shortness of breath.   Cardiovascular: Negative.  Negative for chest pain.  Gastrointestinal: Negative.  Negative for abdominal pain, blood in stool, diarrhea, melena, nausea and vomiting.  Genitourinary: Negative.  Negative for dysuria, flank pain, frequency, hematuria and urgency.  Musculoskeletal: Positive for back  pain.  Skin: Negative.  Negative for rash.  Neurological: Negative.  Negative for dizziness and headaches.  Endo/Heme/Allergies: Negative.   All other systems reviewed and are negative.   Vitals:   09/05/18 1553  BP: (!) 153/67  Pulse: 63  Resp: 16  Temp: 98.3 F (36.8 C)  SpO2: 95%    Physical Exam Vitals signs reviewed.  Constitutional:      Appearance: Normal appearance.  HENT:     Head: Normocephalic and atraumatic.  Mouth/Throat:     Mouth: Mucous membranes are moist.     Pharynx: Oropharynx is clear.  Eyes:     Extraocular Movements: Extraocular movements intact.     Conjunctiva/sclera: Conjunctivae normal.     Pupils: Pupils are equal, round, and reactive to light.  Neck:     Musculoskeletal: Normal range of motion and neck supple.  Cardiovascular:     Rate and Rhythm: Normal rate and regular rhythm.     Heart sounds: Normal heart sounds.  Pulmonary:     Effort: Pulmonary effort is normal.     Breath sounds: Normal breath sounds.  Abdominal:     General: Abdomen is flat. There is no distension.     Palpations: Abdomen is soft.     Tenderness: There is no abdominal tenderness.  Musculoskeletal:     Thoracic back: He exhibits normal range of motion and no bony tenderness.     Lumbar back: He exhibits decreased range of motion. He exhibits no bony tenderness.       Arms:  Skin:    General: Skin is warm and dry.     Capillary Refill: Capillary refill takes less than 2 seconds.  Neurological:     General: No focal deficit present.     Mental Status: He is alert and oriented to person, place, and time.     Sensory: No sensory deficit.     Motor: No weakness.     Coordination: Coordination normal.     Gait: Gait normal.     Deep Tendon Reflexes: Reflexes normal.  Psychiatric:        Mood and Affect: Mood normal.        Behavior: Behavior normal.    A total of 25 minutes was spent in the room with the patient, greater than 50% of which was in  counseling/coordination of care regarding differential diagnosis, treatment, medications, and need for follow-up if no better or worse.   ASSESSMENT & PLAN: Phillip Maynard was seen today for back pain.  Diagnoses and all orders for this visit:  Right flank pain  Musculoskeletal pain -     traMADol (ULTRAM) 50 MG tablet; Take 1 tablet (50 mg total) by mouth every 8 (eight) hours as needed for severe pain.  Acute myofascial strain of lumbar region, initial encounter    Patient Instructions       If you have lab work done today you will be contacted with your lab results within the next 2 weeks.  If you have not heard from [place] then please contact us. The fastest way to get your results is to register for My Chart.   IF you received an x-ray today, you will receive an invoice from [facility]. Please contact [facility] at [phone] with questions or concerns regarding your invoice.   IF you received labwork today, you will receive an invoice from [facility]. Please contact [facility] at [phone] with questions or concerns regarding your invoice.   Our billing staff will not be able to assist you with questions regarding bills from these companies.  You will be contacted with the lab results as soon as they are available. The fastest way to get your results is to activate your My Chart account. Instructions are located on the last page of this paperwork. If you have not heard from [place] regarding the results in 2 weeks, please contact this office.     Acute Back Pain, Adult Acute back pain is sudden and usually short-lived. It  is often caused by an injury to the muscles and tissues in the back. The injury may result from:  A muscle or ligament getting overstretched or torn (strained). Ligaments are tissues that connect bones to each other. Lifting something improperly can cause a back strain.  Wear and tear (degeneration) of the spinal disks. Spinal disks are circular  tissue that provides cushioning between the bones of the spine (vertebrae).  Twisting motions, such as while playing sports or doing yard work.  A hit to the back.  Arthritis. You may have a physical exam, lab tests, and imaging tests to find the cause of your pain. Acute back pain usually goes away with rest and home care. Follow these instructions at home: Managing pain, stiffness, and swelling  Take over-the-counter and prescription medicines only as told by your health care provider.  Your health care provider may recommend applying ice during the first 24-48 hours after your pain starts. To do this: ? Put ice in a plastic bag. ? Place a towel between your skin and the bag. ? Leave the ice on for 20 minutes, 2-3 times a day.  If directed, apply heat to the affected area as often as told by your health care provider. Use the heat source that your health care provider recommends, such as a moist heat pack or a heating pad. ? Place a towel between your skin and the heat source. ? Leave the heat on for 20-30 minutes. ? Remove the heat if your skin turns bright red. This is especially important if you are unable to feel pain, heat, or cold. You have a greater risk of getting burned. Activity   Do not stay in bed. Staying in bed for more than 1-2 days can delay your recovery.  Sit up and stand up straight. Avoid leaning forward when you sit, or hunching over when you stand. ? If you work at a desk, sit close to it so you do not need to lean over. Keep your chin tucked in. Keep your neck drawn back, and keep your elbows bent at a right angle. Your arms should look like the letter "L." ? Sit high and close to the steering wheel when you drive. Add lower back (lumbar) support to your car seat, if needed.  Take short walks on even surfaces as soon as you are able. Try to increase the length of time you walk each day.  Do not sit, drive, or stand in one place for more than 30 minutes at a  time. Sitting or standing for long periods of time can put stress on your back.  Do not drive or use heavy machinery while taking prescription pain medicine.  Use proper lifting techniques. When you bend and lift, use positions that put less stress on your back: ? [place] your knees. ? Keep the load close to your body. ? Avoid twisting.  Exercise regularly as told by your health care provider. Exercising helps your back heal faster and helps prevent back injuries by keeping muscles strong and flexible.  Work with a physical therapist to make a safe exercise program, as recommended by your health care provider. Do any exercises as told by your physical therapist. Lifestyle  Maintain a healthy weight. Extra weight puts stress on your back and makes it difficult to have good posture.  Avoid activities or situations that make you feel anxious or stressed. Stress and anxiety increase muscle tension and can make back pain worse. Learn ways to manage anxiety  and stress, such as through exercise. General instructions  Sleep on a firm mattress in a comfortable position. Try lying on your side with your knees slightly bent. If you lie on your back, put a pillow under your knees.  Follow your treatment plan as told by your health care provider. This may include: ? Cognitive or behavioral therapy. ? Acupuncture or massage therapy. ? Meditation or yoga. Contact a health care provider if:  You have pain that is not relieved with rest or medicine.  You have increasing pain going down into your legs or buttocks.  Your pain does not improve after 2 weeks.  You have pain at night.  You lose weight without trying.  You have a fever or chills. Get help right away if:  You develop new bowel or bladder control problems.  You have unusual weakness or numbness in your arms or legs.  You develop nausea or vomiting.  You develop abdominal pain.  You feel faint. Summary  Acute back pain is  sudden and usually short-lived.  Use proper lifting techniques. When you bend and lift, use positions that put less stress on your back.  Take over-the-counter and prescription medicines and apply heat or ice as directed by your health care provider. This information is not intended to replace advice given to you by your health care provider. Make sure you discuss any questions you have with your health care provider. Document Released: 08/01/2005 Document Revised: 03/08/2018 Document Reviewed: 03/15/2017 Elsevier Interactive Patient Education  2019 [employer].      Agustina Caroli, MD [facility]

## 2018-10-09 ENCOUNTER — Other Ambulatory Visit: Payer: Self-pay | Admitting: Family Medicine

## 2018-10-09 DIAGNOSIS — I10 Essential (primary) hypertension: Secondary | ICD-10-CM

## 2018-10-10 NOTE — Telephone Encounter (Signed)
I called pt and left a message for him to call us for an appt for a refill on his Hyzaar.   I gave him a 30 day courtsey refill with no refills.

## 2018-10-12 ENCOUNTER — Ambulatory Visit (INDEPENDENT_AMBULATORY_CARE_PROVIDER_SITE_OTHER): Payer: Medicare HMO | Admitting: Emergency Medicine

## 2018-10-12 ENCOUNTER — Encounter: Payer: Self-pay | Admitting: Emergency Medicine

## 2018-10-12 ENCOUNTER — Other Ambulatory Visit: Payer: Self-pay

## 2018-10-12 VITALS — BP 151/75 | HR 61 | Temp 98.8°F | Resp 18 | Ht 67.0 in | Wt 176.6 lb

## 2018-10-12 DIAGNOSIS — I10 Essential (primary) hypertension: Secondary | ICD-10-CM

## 2018-10-12 DIAGNOSIS — L989 Disorder of the skin and subcutaneous tissue, unspecified: Secondary | ICD-10-CM

## 2018-10-12 MED ORDER — LOSARTAN POTASSIUM-HCTZ 100-12.5 MG PO TABS: 1.0000 | ORAL_TABLET | Freq: Every day | ORAL | 3 refills | Status: DC

## 2018-10-12 NOTE — Progress Notes (Signed)
Phillip Maynard. 78 y.o.   Chief Complaint  Patient presents with  . Hypertension    follow up and needs meds refilled and has some type of mole like thing at belt line that he would like you to take a look at     [place]: This is a 78 y.o. male complaining of 2 things: 1.  History of systemic hypertension, needs medication refill.  Taking Hyzaar 100-12.5 mg. 2.  Mole like lesion to his left lower back for years. No other complaints or medical concerns today. Patient Active Problem List   Diagnosis Date Noted  . 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 Hypertension 02/28/2013  . Type 2 diabetes mellitus ([place]) 02/28/2013  . Chronic hepatitis C ([place]) 02/14/2011  . TOBACCO ABUSE 12/10/2007  . 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 Authorizing Provider  finasteride (PROSCAR) 5 MG tablet Take 5 mg by mouth daily. 09/22/17  Yes [provider]  losartan-hydrochlorothiazide (HYZAAR) 100-12.5 MG tablet TAKE 1/2 TABLET EVERY DAY.   APPOINTMENT IS NEEDED FOR FURTHER REFILS 10/10/18  Yes Wendie Agreste, MD  tamsulosin (FLOMAX) 0.4 MG CAPS capsule Take 0.4 mg by mouth at bedtime.    Yes [provider]  traMADol (ULTRAM) 50 MG tablet Take 1 tablet (50 mg total) by mouth every 8 (eight) hours as needed for severe pain. Patient not taking: Reported on 10/12/2018 09/05/18   Horald Pollen, MD  varenicline (CHANTIX) 0.5 MG tablet Take 1 tab po qd x 3d, then 1 tab po bid x 4d, then 2 tabs po bid Patient not taking: Reported on 09/05/2018 03/10/18   Shawnee Knapp, MD  varenicline (CHANTIX) 1 MG tablet Take 1 tablet (1 mg total) by mouth 2 (two) times daily. Patient not taking: Reported on 09/05/2018 04/10/18   Shawnee Knapp, MD    Allergies  Allergen Reactions  . Penicillins Anaphylaxis and Other (See Comments)    Has patient had a PCN  reaction causing immediate rash, facial/tongue/throat swelling, SOB or lightheadedness with hypotension: Yes Has patient had a PCN reaction causing severe rash involving mucus membranes or skin necrosis: No Has patient had a PCN reaction that required hospitalization No Has patient had a PCN reaction occurring within the last 10 years: No If all of the above answers are "NO", then may proceed with Cephalosporin use.  Marland Kitchen Shellfish Allergy Anaphylaxis    Patient Active Problem List   Diagnosis Date Noted  . Musculoskeletal pain 09/05/2018  . Acute lumbar myofascial strain 09/05/2018  . 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 Hypertension 02/28/2013  . Type 2 diabetes mellitus ([place]) 02/28/2013  . Chronic hepatitis C ([place]) 02/14/2011  . TOBACCO ABUSE 12/10/2007  . 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 Asthma    "when I was a boy" (04/08/2013)  . Bronchitis   . Colon polyps   . Colovesical fistula   . Diverticulosis   . GERD (gastroesophageal reflux disease)   . Hepatitis C    treated with injections  . High cholesterol   . Hypertension   . Type II diabetes mellitus ([facility])    hx of . No longer on medicine    Past Surgical History:  Procedure Laterality Date  . CYSTOSCOPY N/A 09/08/2016   Procedure: FIREFLY INJECTIONS;  Surgeon: Cleon Gustin,  MD;  Location: [facility];  Service: Urology;  Laterality: N/A;  . INCISION AND DRAINAGE OF WOUND Right 1970's   "leg" (04/08/2013)  . LIVER BIOPSY  ~ 2011  . PROCTOSCOPY N/A 09/08/2016   Procedure: RIGID PROCTOSCOPY;  Surgeon: Michael Boston, MD;  Location: [facility];  Service: General;  Laterality: N/A;  . TONSILLECTOMY      Social History   Socioeconomic History  . Marital status: Married    Spouse name: Not on file  . Number of children: 2  . Years of education: Not on file  . Highest education level: Not on file  Occupational History  . 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 Financial resource strain: Not on file  . Food insecurity:    Worry: Not on file    Inability: Not on file  . Transportation needs:    Medical: Not on file    Non-medical: Not on file  Tobacco Use  . Smoking status: Current Every Day Smoker    Packs/day: 0.25    Years: 45.00    Pack years: 11.25    Types: Cigarettes  . Smokeless tobacco: Never Used  Substance and Sexual Activity  . Alcohol use: Yes    Comment: 04/08/2013 "haven't drank nothing in > 3 yr; before then I'd have at least 1 pint/day"  . Drug use: No  . Sexual activity: Yes  Lifestyle  . Physical activity:    Days per week: Not on file    Minutes per session: Not on file  . Stress: Not on file  Relationships  . Social connections:    Talks on phone: Not on file    Gets together: Not on file    Attends religious service: Not on file    Active member of club or organization: Not on file    Attends meetings of clubs or organizations: Not on file    Relationship status: Not on file  . Intimate partner violence:    Fear of current or ex partner: Not on file    Emotionally abused: Not on file    Physically abused: Not on file    Forced sexual activity: Not on file  Other Topics Concern  . Not on file  Social History Narrative   Pt lives in 1 story home with his wife   Has 2 adult children   Highest level of education: GED & Trade school   Retired Media planner.     Family History  Problem Relation Age of Onset  . Asthma Mother   . Heart attack Mother      Review of Systems  Constitutional: Negative.  Negative for chills, fever and weight loss.  HENT: Negative.   Eyes: Negative.   Respiratory: Negative for cough and shortness of breath.   Cardiovascular: Negative.  Negative for chest pain and palpitations.  Gastrointestinal: Negative for abdominal pain, diarrhea, nausea and vomiting.  Genitourinary: Negative.   Skin:        Skin lesion  Neurological: Negative.  Negative for dizziness and headaches.  Endo/Heme/Allergies: Negative.   All other systems reviewed and are negative.    BP Readings from Last 3 Encounters:  10/12/18 (!) 151/75  09/05/18 (!) 153/67  04/27/18 140/66   Vitals:   10/12/18 0917  BP: (!) 151/75  Pulse: 61  Resp: 18  Temp: 98.8 F (37.1 C)  SpO2: 98%     Physical Exam  Vitals signs reviewed.  Constitutional:      Appearance: Normal appearance.  HENT:     Head: Normocephalic and atraumatic.     Nose: Nose normal.     Mouth/Throat:     Mouth: Mucous membranes are moist.     Pharynx: Oropharynx is clear.     Comments: Poor dentition Eyes:     Extraocular Movements: Extraocular movements intact.     Conjunctiva/sclera: Conjunctivae normal.     Pupils: Pupils are equal, round, and reactive to light.  Neck:     Musculoskeletal: Normal range of motion.  Cardiovascular:     Rate and Rhythm: Normal rate and regular rhythm.     Heart sounds: Normal heart sounds.  Pulmonary:     Effort: Pulmonary effort is normal.     Breath sounds: Normal breath sounds.  Musculoskeletal: Normal range of motion.  Skin:    General: Skin is warm and dry.     Capillary Refill: Capillary refill takes less than 2 seconds.     Comments: Positive dark round nontender hard lesion to left lumbar area.  See picture below.  Neurological:     General: No focal deficit present.     Mental Status: He is alert and oriented to person, place, and time.  Psychiatric:        Mood and Affect: Mood normal.        Behavior: Behavior normal.       A total of 25 minutes was spent in the room with the patient, greater than 50% of which was in counseling/coordination of care regarding hypertension and medications, skin lesion and need for dermatology evaluation and biopsy, smoking and need for smoking cessation, nutrition, and need for follow-up.  ASSESSMENT & PLAN: Rykar was seen today for  hypertension.  Diagnoses and all orders for this visit:  Essential hypertension -     losartan-hydrochlorothiazide (HYZAAR) 100-12.5 MG tablet; Take 1 tablet by mouth daily.  Skin lesion -     Ambulatory referral to Dermatology    Patient Instructions       If you have lab work done today you will be contacted with your lab results within the next 2 weeks.  If you have not heard from [place] then please contact us. The fastest way to get your results is to register for My Chart.   IF you received an x-ray today, you will receive an invoice from [facility]. Please contact [facility] at [phone] with questions or concerns regarding your invoice.   IF you received labwork today, you will receive an invoice from [facility]. Please contact [facility] at [phone] with questions or concerns regarding your invoice.   Our billing staff will not be able to assist you with questions regarding bills from these companies.  You will be contacted with the lab results as soon as they are available. The fastest way to get your results is to activate your My Chart account. Instructions are located on the last page of this paperwork. If you have not heard from [place] regarding the results in 2 weeks, please contact this office.     Hypertension Hypertension, commonly called high blood pressure, is when the force of blood pumping through the arteries is too strong. The arteries are the blood vessels that carry blood from the heart throughout the body. Hypertension forces the heart to work harder to pump blood and may cause arteries to become narrow or stiff. Having untreated or uncontrolled hypertension can cause heart attacks, strokes,  kidney disease, and other problems. A blood pressure reading consists of a higher number over a lower number. Ideally, your blood pressure should be below 120/80. The first ("top") number is called the systolic pressure. It is a measure of the pressure in  your arteries as your heart beats. The second ("bottom") number is called the diastolic pressure. It is a measure of the pressure in your arteries as the heart relaxes. What are the causes? The cause of this condition is not known. What increases the risk? Some risk factors for high blood pressure are under your control. Others are not. Factors you can change  Smoking.  Having type 2 diabetes mellitus, high cholesterol, or both.  Not getting enough exercise or physical activity.  Being overweight.  Having too much fat, sugar, calories, or salt (sodium) in your diet.  Drinking too much alcohol. Factors that are difficult or impossible to change  Having chronic kidney disease.  Having a family history of high blood pressure.  Age. Risk increases with age.  Race. You may be at higher risk if you are African-American.  Gender. Men are at higher risk than women before age 72. After age 1, women are at higher risk than men.  Having obstructive sleep apnea.  Stress. What are the signs or symptoms? Extremely high blood pressure (hypertensive crisis) may cause:  Headache.  Anxiety.  Shortness of breath.  Nosebleed.  Nausea and vomiting.  Severe chest pain.  Jerky movements you cannot control (seizures). How is this diagnosed? This condition is diagnosed by measuring your blood pressure while you are seated, with your arm resting on a surface. The cuff of the blood pressure monitor will be placed directly against the skin of your upper arm at the level of your heart. It should be measured at least twice using the same arm. Certain conditions can cause a difference in blood pressure between your right and left arms. Certain factors can cause blood pressure readings to be lower or higher than normal (elevated) for a short period of time:  When your blood pressure is higher when you are in a health care provider's office than when you are at home, this is called white coat  hypertension. Most people with this condition do not need medicines.  When your blood pressure is higher at home than when you are in a health care provider's office, this is called masked hypertension. Most people with this condition may need medicines to control blood pressure. If you have a high blood pressure reading during one visit or you have normal blood pressure with other risk factors:  You may be asked to return on a different day to have your blood pressure checked again.  You may be asked to monitor your blood pressure at home for 1 week or longer. If you are diagnosed with hypertension, you may have other blood or imaging tests to help your health care provider understand your overall risk for other conditions. How is this treated? This condition is treated by making healthy lifestyle changes, such as eating healthy foods, exercising more, and reducing your alcohol intake. Your health care provider may prescribe medicine if lifestyle changes are not enough to get your blood pressure under control, and if:  Your systolic blood pressure is above 130.  Your diastolic blood pressure is above 80. Your personal target blood pressure may vary depending on your medical conditions, your age, and other factors. Follow these instructions at home: Eating and drinking   Eat a  diet that is high in fiber and potassium, and low in sodium, added sugar, and fat. An example eating plan is called the DASH (Dietary Approaches to Stop Hypertension) diet. To eat this way: ? Eat plenty of fresh fruits and vegetables. Try to fill half of your plate at each meal with fruits and vegetables. ? Eat whole grains, such as whole wheat pasta, brown rice, or whole grain bread. Fill about one quarter of your plate with whole grains. ? Eat or drink low-fat dairy products, such as skim milk or low-fat yogurt. ? Avoid fatty cuts of meat, processed or cured meats, and poultry with skin. Fill about one quarter of your  plate with lean proteins, such as fish, chicken without skin, beans, eggs, and tofu. ? Avoid premade and processed foods. These tend to be higher in sodium, added sugar, and fat.  Reduce your daily sodium intake. Most people with hypertension should eat less than 1,500 mg of sodium a day.  Limit alcohol intake to no more than 1 drink a day for nonpregnant women and 2 drinks a day for men. One drink equals 12 oz of beer, 5 oz of wine, or 1 oz of hard liquor. Lifestyle   Work with your health care provider to maintain a healthy body weight or to lose weight. Ask what an ideal weight is for you.  Get at least 30 minutes of exercise that causes your heart to beat faster (aerobic exercise) most days of the week. Activities may include walking, swimming, or biking.  Include exercise to strengthen your muscles (resistance exercise), such as pilates or lifting weights, as part of your weekly exercise routine. Try to do these types of exercises for 30 minutes at least 3 days a week.  Do not use any products that contain nicotine or tobacco, such as cigarettes and e-cigarettes. If you need help quitting, ask your health care provider.  Monitor your blood pressure at home as told by your health care provider.  Keep all follow-up visits as told by your health care provider. This is important. Medicines  Take over-the-counter and prescription medicines only as told by your health care provider. Follow directions carefully. Blood pressure medicines must be taken as prescribed.  Do not skip doses of blood pressure medicine. Doing this puts you at risk for problems and can make the medicine less effective.  Ask your health care provider about side effects or reactions to medicines that you should watch for. Contact a health care provider if:  You think you are having a reaction to a medicine you are taking.  You have headaches that keep coming back (recurring).  You feel dizzy.  You have swelling  in your ankles.  You have trouble with your vision. Get help right away if:  You develop a severe headache or confusion.  You have unusual weakness or numbness.  You feel faint.  You have severe pain in your chest or abdomen.  You vomit repeatedly.  You have trouble breathing. Summary  Hypertension is when the force of blood pumping through your arteries is too strong. If this condition is not controlled, it may put you at risk for serious complications.  Your personal target blood pressure may vary depending on your medical conditions, your age, and other factors. For most people, a normal blood pressure is less than 120/80.  Hypertension is treated with lifestyle changes, medicines, or a combination of both. Lifestyle changes include weight loss, eating a healthy, low-sodium diet, exercising more, and  limiting alcohol. This information is not intended to replace advice given to you by your health care provider. Make sure you discuss any questions you have with your health care provider. Document Released: 08/01/2005 Document Revised: 06/29/2016 Document Reviewed: 06/29/2016 Elsevier Interactive Patient Education  2019 [employer]      Agustina Caroli, MD [facility]

## 2018-10-12 NOTE — Patient Instructions (Addendum)
   If you have lab work done today you will be contacted with your lab results within the next 2 weeks.  If you have not heard from us then please contact us. The fastest way to get your results is to register for My Chart.   IF you received an x-ray today, you will receive an invoice from [facility]. Please contact [facility] at [phone] with questions or concerns regarding your invoice.   IF you received labwork today, you will receive an invoice from [facility]. Please contact [facility] at [phone] with questions or concerns regarding your invoice.   Our billing staff will not be able to assist you with questions regarding bills from these companies.  You will be contacted with the lab results as soon as they are available. The fastest way to get your results is to activate your My Chart account. Instructions are located on the last page of this paperwork. If you have not heard from us regarding the results in 2 weeks, please contact this office.       Hypertension Hypertension, commonly called high blood pressure, is when the force of blood pumping through the arteries is too strong. The arteries are the blood vessels that carry blood from the heart throughout the body. Hypertension forces the heart to work harder to pump blood and may cause arteries to become narrow or stiff. Having untreated or uncontrolled hypertension can cause heart attacks, strokes, kidney disease, and other problems. A blood pressure reading consists of a higher number over a lower number. Ideally, your blood pressure should be below 120/80. The first ("top") number is called the systolic pressure. It is a measure of the pressure in your arteries as your heart beats. The second ("bottom") number is called the diastolic pressure. It is a measure of the pressure in your arteries as the heart relaxes. What are the causes? The cause of this condition is not known. What increases the  risk? Some risk factors for high blood pressure are under your control. Others are not. Factors you can change  Smoking.  Having type 2 diabetes mellitus, high cholesterol, or both.  Not getting enough exercise or physical activity.  Being overweight.  Having too much fat, sugar, calories, or salt (sodium) in your diet.  Drinking too much alcohol. Factors that are difficult or impossible to change  Having chronic kidney disease.  Having a family history of high blood pressure.  Age. Risk increases with age.  Race. You may be at higher risk if you are African-American.  Gender. Men are at higher risk than women before age 45. After age 65, women are at higher risk than men.  Having obstructive sleep apnea.  Stress. What are the signs or symptoms? Extremely high blood pressure (hypertensive crisis) may cause:  Headache.  Anxiety.  Shortness of breath.  Nosebleed.  Nausea and vomiting.  Severe chest pain.  Jerky movements you cannot control (seizures). How is this diagnosed? This condition is diagnosed by measuring your blood pressure while you are seated, with your arm resting on a surface. The cuff of the blood pressure monitor will be placed directly against the skin of your upper arm at the level of your heart. It should be measured at least twice using the same arm. Certain conditions can cause a difference in blood pressure between your right and left arms. Certain factors can cause blood pressure readings to be lower or higher than normal (elevated) for a short period of time:    When your blood pressure is higher when you are in a health care provider's office than when you are at home, this is called white coat hypertension. Most people with this condition do not need medicines.  When your blood pressure is higher at home than when you are in a health care provider's office, this is called masked hypertension. Most people with this condition may need medicines  to control blood pressure. If you have a high blood pressure reading during one visit or you have normal blood pressure with other risk factors:  You may be asked to return on a different day to have your blood pressure checked again.  You may be asked to monitor your blood pressure at home for 1 week or longer. If you are diagnosed with hypertension, you may have other blood or imaging tests to help your health care provider understand your overall risk for other conditions. How is this treated? This condition is treated by making healthy lifestyle changes, such as eating healthy foods, exercising more, and reducing your alcohol intake. Your health care provider may prescribe medicine if lifestyle changes are not enough to get your blood pressure under control, and if:  Your systolic blood pressure is above 130.  Your diastolic blood pressure is above 80. Your personal target blood pressure may vary depending on your medical conditions, your age, and other factors. Follow these instructions at home: Eating and drinking   Eat a diet that is high in fiber and potassium, and low in sodium, added sugar, and fat. An example eating plan is called the DASH (Dietary Approaches to Stop Hypertension) diet. To eat this way: ? Eat plenty of fresh fruits and vegetables. Try to fill half of your plate at each meal with fruits and vegetables. ? Eat whole grains, such as whole wheat pasta, brown rice, or whole grain bread. Fill about one quarter of your plate with whole grains. ? Eat or drink low-fat dairy products, such as skim milk or low-fat yogurt. ? Avoid fatty cuts of meat, processed or cured meats, and poultry with skin. Fill about one quarter of your plate with lean proteins, such as fish, chicken without skin, beans, eggs, and tofu. ? Avoid premade and processed foods. These tend to be higher in sodium, added sugar, and fat.  Reduce your daily sodium intake. Most people with hypertension should  eat less than 1,500 mg of sodium a day.  Limit alcohol intake to no more than 1 drink a day for nonpregnant women and 2 drinks a day for men. One drink equals 12 oz of beer, 5 oz of wine, or 1 oz of hard liquor. Lifestyle   Work with your health care provider to maintain a healthy body weight or to lose weight. Ask what an ideal weight is for you.  Get at least 30 minutes of exercise that causes your heart to beat faster (aerobic exercise) most days of the week. Activities may include walking, swimming, or biking.  Include exercise to strengthen your muscles (resistance exercise), such as pilates or lifting weights, as part of your weekly exercise routine. Try to do these types of exercises for 30 minutes at least 3 days a week.  Do not use any products that contain nicotine or tobacco, such as cigarettes and e-cigarettes. If you need help quitting, ask your health care provider.  Monitor your blood pressure at home as told by your health care provider.  Keep all follow-up visits as told by your health care provider.   This is important. Medicines  Take over-the-counter and prescription medicines only as told by your health care provider. Follow directions carefully. Blood pressure medicines must be taken as prescribed.  Do not skip doses of blood pressure medicine. Doing this puts you at risk for problems and can make the medicine less effective.  Ask your health care provider about side effects or reactions to medicines that you should watch for. Contact a health care provider if:  You think you are having a reaction to a medicine you are taking.  You have headaches that keep coming back (recurring).  You feel dizzy.  You have swelling in your ankles.  You have trouble with your vision. Get help right away if:  You develop a severe headache or confusion.  You have unusual weakness or numbness.  You feel faint.  You have severe pain in your chest or abdomen.  You vomit  repeatedly.  You have trouble breathing. Summary  Hypertension is when the force of blood pumping through your arteries is too strong. If this condition is not controlled, it may put you at risk for serious complications.  Your personal target blood pressure may vary depending on your medical conditions, your age, and other factors. For most people, a normal blood pressure is less than 120/80.  Hypertension is treated with lifestyle changes, medicines, or a combination of both. Lifestyle changes include weight loss, eating a healthy, low-sodium diet, exercising more, and limiting alcohol. This information is not intended to replace advice given to you by your health care provider. Make sure you discuss any questions you have with your health care provider. Document Released: 08/01/2005 Document Revised: 06/29/2016 Document Reviewed: 06/29/2016 Elsevier Interactive Patient Education  2019 [employer]  

## 2018-11-01 ENCOUNTER — Other Ambulatory Visit: Payer: Self-pay

## 2018-11-01 ENCOUNTER — Ambulatory Visit (INDEPENDENT_AMBULATORY_CARE_PROVIDER_SITE_OTHER): Payer: Medicare HMO | Admitting: Family Medicine

## 2018-11-01 ENCOUNTER — Encounter: Payer: Self-pay | Admitting: Family Medicine

## 2018-11-01 VITALS — BP 128/62 | HR 59 | Temp 98.2°F | Resp 16 | Ht 67.0 in | Wt 176.6 lb

## 2018-11-01 DIAGNOSIS — E785 Hyperlipidemia, unspecified: Secondary | ICD-10-CM

## 2018-11-01 DIAGNOSIS — I10 Essential (primary) hypertension: Secondary | ICD-10-CM | POA: Diagnosis not present

## 2018-11-01 DIAGNOSIS — E1100 Type 2 diabetes mellitus with hyperosmolarity without nonketotic hyperglycemic-hyperosmolar coma (NKHHC): Secondary | ICD-10-CM

## 2018-11-01 DIAGNOSIS — F17210 Nicotine dependence, cigarettes, uncomplicated: Secondary | ICD-10-CM | POA: Diagnosis not present

## 2018-11-01 MED ORDER — VARENICLINE TARTRATE 1 MG PO TABS: 1.0000 mg | ORAL_TABLET | Freq: Two times a day (BID) | ORAL | 2 refills | Status: DC

## 2018-11-01 MED ORDER — LOSARTAN POTASSIUM-HCTZ 100-12.5 MG PO TABS: 1.0000 | ORAL_TABLET | Freq: Every day | ORAL | 3 refills | Status: DC

## 2018-11-01 MED ORDER — VARENICLINE TARTRATE 0.5 MG X 11 & 1 MG X 42 PO MISC: ORAL | 0 refills | Status: DC

## 2018-11-01 MED ORDER — LOSARTAN POTASSIUM-HCTZ 100-12.5 MG PO TABS: 0.5000 | ORAL_TABLET | Freq: Every day | ORAL | 1 refills | Status: DC

## 2018-11-01 NOTE — Patient Instructions (Addendum)
See information below on steps for quitting smoking.  I have prescribed Chantix again.  Let me know if there are questions or if we can help further.  No change in medications for now, let me know if there are questions.   If you have lab work done today you will be contacted with your lab results within the next 2 weeks.  If you have not heard from [place] then please contact us. The fastest way to get your results is to register for My Chart.   IF you received an x-ray today, you will receive an invoice from [facility]. Please contact [facility] at [phone] with questions or concerns regarding your invoice.   IF you received labwork today, you will receive an invoice from [facility]. Please contact [facility] at [phone] with questions or concerns regarding your invoice.   Our billing staff will not be able to assist you with questions regarding bills from these companies.  You will be contacted with the lab results as soon as they are available. The fastest way to get your results is to activate your My Chart account. Instructions are located on the last page of this paperwork. If you have not heard from [place] regarding the results in 2 weeks, please contact this office.

## 2018-11-01 NOTE — Progress Notes (Signed)
Subjective:    Patient ID: Phillip Maynard., male    DOB: Oct 30, 1940, 78 y.o.   MRN: [id number]  HPI [name]. is a 78 y.o. male Presents today for: Chief Complaint  Patient presents with   chronic medication syndrome    need refill on bp meds losartan   Hypertension: BP Readings from Last 3 Encounters:  11/01/18 128/62  10/12/18 (!) 151/75  09/05/18 (!) 153/67   Lab Results  Component Value Date   CREATININE 0.93 03/09/2018   Taking 1/2 pill Hyzaar QD. Out of meds last visit. On today.  No new side effects with meds.   On flomax and proscar from urology.   Tobacco abuse: 3 packs per week.  Has cut back - as low as 3 cigs per day. Now 7-8 per day.  No meds in past to quit. Would like to quit.  Would like to try chantix - potential side effects discussed.  5 min discussion.   Diabetes:  Diet control. Has been watching diet.  Microalbumin: due Optho, foot exam, pneumovax: up to date.  Plans on dentist eval this year.  On ARB.   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 No longer on  medicine   Past Surgical History:  Procedure Laterality Date   CYSTOSCOPY N/A 09/08/2016   Procedure: FIREFLY INJECTIONS;  Surgeon: Cleon Gustin, MD;  Location: [facility];  Service: Urology;  Laterality: N/A;   INCISION AND DRAINAGE OF WOUND Right 1970's   "leg" (04/08/2013)   LIVER BIOPSY  ~ 2011   PROCTOSCOPY N/A 09/08/2016   Procedure: RIGID PROCTOSCOPY;  Surgeon: Michael Boston, MD;  Location: [facility];  Service: General;  Laterality: N/A;   TONSILLECTOMY     Allergies  Allergen Reactions   Penicillins Anaphylaxis and Other (See Comments)    Has patient had a PCN reaction causing immediate rash, facial/tongue/throat swelling, SOB or lightheadedness with hypotension: Yes Has patient had a PCN reaction causing severe rash involving mucus membranes or skin necrosis: No Has patient had a PCN reaction that required hospitalization No Has patient had a PCN reaction occurring within the last 10 years: No If all of the above answers are "NO", then may proceed with Cephalosporin use.   Shellfish Allergy Anaphylaxis   Prior to Admission medications   Medication Sig Start Date End Date Taking? Authorizing Provider  finasteride (PROSCAR) 5 MG tablet Take 5 mg by mouth daily. 09/22/17  Yes [provider]  losartan-hydrochlorothiazide (HYZAAR) 100-12.5 MG tablet Take 1 tablet by mouth daily. 10/12/18 01/10/19 Yes Sagardia, Ines Bloomer, MD  tamsulosin (FLOMAX) 0.4 MG CAPS capsule Take 0.4 mg by mouth at bedtime.    Yes [provider]   Social History   Socioeconomic History   Marital status: Married    Spouse name: Not on file   Number of children: 2   Years of education: Not on file   Highest education level: Not on file  Occupational History   Occupation: truck Education administrator: [employer]    Comment: retired  Scientist, product/process development strain: Not on Training and development officer insecurity:    Worry: Not on file    Inability: Not on file   Transportation  needs:    Medical: Not on file    Non-medical: Not on file  Tobacco Use   Smoking status: Current Every Day Smoker    Packs/day: 0.25    Years: 45.00    Pack years: 11.25    Types: Cigarettes   Smokeless tobacco: Never Used  Substance and Sexual Activity   Alcohol use: Yes    Comment: 04/08/2013 "haven't drank nothing in > 3 yr; before then I'd have at least 1 pint/day"   Drug use: No   Sexual activity: Yes  Lifestyle   Physical activity:    Days per week: Not on file    Minutes per session: Not on file   Stress: Not on file  Relationships   Social connections:    Talks on phone: Not on file    Gets together: Not on file    Attends religious service: Not on file    Active member of club or organization: Not on file    Attends meetings of clubs or organizations: Not on file    Relationship status: Not on file   Intimate partner violence:    Fear of current or ex partner: Not on file    Emotionally abused: Not on file    Physically abused: Not on file    Forced sexual activity: Not on file  Other Topics Concern   Not on file  Social History Narrative   Pt lives in 1 story home with his wife   Has 2 adult children   Highest level of education: GED & Trade school   Retired Media planner.    Review of Systems  Constitutional: Negative for fatigue and unexpected weight change.  Eyes: Negative for visual disturbance.  Respiratory: Negative for cough, chest tightness and shortness of breath.   Cardiovascular: Negative for chest pain, palpitations and leg swelling.  Gastrointestinal: Negative for abdominal pain and blood in stool.  Neurological: Negative for dizziness, light-headedness and headaches.       Objective:   Physical Exam Vitals signs reviewed.  Constitutional:      Appearance: He is well-developed.  HENT:     Head: Normocephalic and atraumatic.  Eyes:     Pupils: Pupils are equal, round, and reactive to light.  Neck:     Vascular: No  carotid bruit or JVD.  Cardiovascular:     Rate and Rhythm: Normal rate and regular rhythm.     Heart sounds: Normal heart sounds. No murmur.  Pulmonary:     Effort: Pulmonary effort is normal.     Breath sounds: Normal breath sounds. No rales.  Skin:    General: Skin is warm and dry.  Neurological:     Mental Status:  He is alert and oriented to person, place, and time.    Vitals:   11/01/18 1552  BP: 128/62  Pulse: (!) 59  Resp: 16  Temp: 98.2 F (36.8 C)  TempSrc: Oral  SpO2: 98%  Weight: 176 lb 9.6 oz (80.1 kg)  Height: 5\' 7"  (1.702 m)      Assessment & Plan:   Renel Ende. is a 78 y.o. male Essential hypertension - Plan: Comprehensive metabolic panel, losartan-hydrochlorothiazide (HYZAAR) 100-12.5 MG tablet, DISCONTINUED: losartan-hydrochlorothiazide (HYZAAR) 100-12.5 MG tablet  -Stable on current dose of losartan HCTZ, 1/2 pill/day.  Continue same, labs pending.  Hyperlipidemia, unspecified hyperlipidemia type - Plan: Lipid Panel  -Check lipids, consider statin if LDL over 70 with history of diabetes although diet controlled.  Type 2 diabetes mellitus with hyperosmolarity without coma, without long-term current use of insulin (HCC) - Plan: Hemoglobin A1c  -Previously diet controlled, check A1c  Cigarette nicotine dependence without complication - Plan: varenicline (CHANTIX STARTING MONTH PAK) 0.5 MG X 11 & 1 MG X 42 tablet, varenicline (CHANTIX CONTINUING MONTH PAK) 1 MG tablet  -Nicotine and smoking cessation discussed as above.  Did agree to try Chantix, potential side effects and risks were discussed.  Starting and continued month pack provided.   Meds ordered this encounter  Medications   varenicline (CHANTIX STARTING MONTH PAK) 0.5 MG X 11 & 1 MG X 42 tablet    Sig: Take one 0.5 mg tablet by mouth once daily for 3 days, then increase to one 0.5 mg tablet twice daily for 4 days, then increase to one 1 mg tablet twice daily.    Dispense:  53 tablet     Refill:  0   varenicline (CHANTIX CONTINUING MONTH PAK) 1 MG tablet    Sig: Take 1 tablet (1 mg total) by mouth 2 (two) times daily.    Dispense:  60 tablet    Refill:  2   DISCONTD: losartan-hydrochlorothiazide (HYZAAR) 100-12.5 MG tablet    Sig: Take 1 tablet by mouth daily.    Dispense:  90 tablet    Refill:  3   losartan-hydrochlorothiazide (HYZAAR) 100-12.5 MG tablet    Sig: Take 0.5 tablets by mouth daily. Disregard rx for 1 tab QD. Should be 1/2 tab qd.    Dispense:  90 tablet    Refill:  1   Patient Instructions   See information below on steps for quitting smoking.  I have prescribed Chantix again.  Let me know if there are questions or if we can help further.  No change in medications for now, let me know if there are questions.   If you have lab work done today you will be contacted with your lab results within the next 2 weeks.  If you have not heard from [place] then please contact us. The fastest way to get your results is to register for My Chart.   IF you received an x-ray today, you will receive an invoice from [facility]. Please contact [facility] at [phone] with questions or concerns regarding your invoice.   IF you received labwork today, you will receive an invoice from [facility]. Please contact [facility] at [phone] with questions or concerns regarding your invoice.   Our billing staff will not be able to assist you with questions regarding bills from these companies.  You will be contacted with the lab results as soon as they are available. The fastest way to get your results is to activate your My Chart  account. Instructions are located on the last page of this paperwork. If you have not heard from [place] regarding the results in 2 weeks, please contact this office.       Signed,   [name], MD [facility] [place].  11/03/18 3:10 PM

## 2018-11-02 LAB — HEMOGLOBIN A1C
Est. average glucose Bld gHb Est-mCnc: 137 mg/dL
Hgb A1c MFr Bld: 6.4 % — ABNORMAL HIGH (ref 4.8–5.6)

## 2018-11-02 LAB — COMPREHENSIVE METABOLIC PANEL
ALT: 15 IU/L (ref 0–44)
AST: 20 IU/L (ref 0–40)
Albumin/Globulin Ratio: 1.4 (ref 1.2–2.2)
Albumin: 4.3 g/dL (ref 3.7–4.7)
Alkaline Phosphatase: 56 IU/L (ref 39–117)
BUN/Creatinine Ratio: 12 (ref 10–24)
BUN: 12 mg/dL (ref 8–27)
Bilirubin Total: 1.1 mg/dL (ref 0.0–1.2)
CALCIUM: 10 mg/dL (ref 8.6–10.2)
CO2: 22 mmol/L (ref 20–29)
CREATININE: 1 mg/dL (ref 0.76–1.27)
Chloride: 98 mmol/L (ref 96–106)
GFR calc Af Amer: 85 mL/min/{1.73_m2} (ref >59)
GFR calc non Af Amer: 73 mL/min/{1.73_m2} (ref >59)
GLOBULIN, TOTAL: 3.1 g/dL (ref 1.5–4.5)
Glucose: 86 mg/dL (ref 65–99)
Potassium: 4.1 mmol/L (ref 3.5–5.2)
Sodium: 139 mmol/L (ref 134–144)
Total Protein: 7.4 g/dL (ref 6.0–8.5)

## 2018-11-02 LAB — LIPID PANEL
Chol/HDL Ratio: 3.1 ratio (ref 0.0–5.0)
Cholesterol, Total: 183 mg/dL (ref 100–199)
HDL: 60 mg/dL (ref >39)
LDL Calculated: 104 mg/dL — ABNORMAL HIGH (ref 0–99)
Triglycerides: 96 mg/dL (ref 0–149)
VLDL Cholesterol Cal: 19 mg/dL (ref 5–40)

## 2018-11-03 ENCOUNTER — Encounter: Payer: Self-pay | Admitting: Family Medicine

## 2018-11-19 ENCOUNTER — Ambulatory Visit (INDEPENDENT_AMBULATORY_CARE_PROVIDER_SITE_OTHER): Payer: Medicare HMO | Admitting: Family Medicine

## 2018-11-19 ENCOUNTER — Other Ambulatory Visit: Payer: Self-pay

## 2018-11-19 VITALS — BP 147/70 | Ht 68.0 in | Wt 176.0 lb

## 2018-11-19 DIAGNOSIS — Z0000 Encounter for general adult medical examination without abnormal findings: Secondary | ICD-10-CM

## 2018-11-19 NOTE — Progress Notes (Addendum)
Presents today for Medicare Annual Wellness Visit   Patient Care Team: Wendie Agreste, MD as PCP - General (Family Medicine) Nickie Retort, MD as Consulting Physician (Urology) Michael Boston, MD as Consulting Physician (General Surgery) Mauri Pole, MD as Consulting Physician (Gastroenterology)   ADVANCE DIRECTIVES: Discussed: yes On File: no Materials Provided: Yes mailed to paitent  Immunization status:  Immunization History  Administered Date(s) Administered  . Influenza, High Dose Seasonal PF 09/05/2018  . Influenza,inj,Quad PF,6+ Mos 05/27/2013, 09/22/2014, 05/23/2016  . Pneumococcal Conjugate-13 01/26/2015  . Pneumococcal Polysaccharide-23 01/18/2010  . Pneumococcal-Unspecified 04/16/2011  . Tdap 06/13/2011, 05/13/2017     There are no preventive care reminders to display for this patient.   Functional Status Survey: Is the patient deaf or have difficulty hearing?: No Does the patient have difficulty seeing, even when wearing glasses/contacts?: No Does the patient have difficulty concentrating, remembering, or making decisions?: No Does the patient have difficulty walking or climbing stairs?: No Does the patient have difficulty dressing or bathing?: No   6CIT Screen 11/19/2018 05/02/2017  What Year? 0 points 0 points  What month? 0 points 0 points  What time? 0 points 0 points  Count back from 20 0 points 0 points  Months in reverse 0 points 0 points  Repeat phrase 0 points 0 points  Total Score 0 0        Clinical Support from 11/19/2018 in Primary Care at [facility]  AUDIT-C Score  0        Patient Active Problem List   Diagnosis Date Noted  . Skin lesion 10/12/2018  . 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 Hypertension 02/28/2013  . Type 2 diabetes mellitus ([place]) 02/28/2013  . Chronic hepatitis C ([place]) 02/14/2011  . TOBACCO ABUSE 12/10/2007  . 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 Asthma    "when I was a boy" (04/08/2013)  . Bronchitis   . Colon polyps   . Colovesical fistula   . Diverticulosis   . GERD (gastroesophageal reflux disease)   . Hepatitis C    treated with injections  . High cholesterol   . Hypertension   . Type II diabetes mellitus ([facility])    hx of . No longer on medicine     Past Surgical History:  Procedure Laterality Date  . CYSTOSCOPY N/A 09/08/2016   Procedure: FIREFLY INJECTIONS;  Surgeon: Cleon Gustin, MD;  Location: [facility];  Service: Urology;  Laterality: N/A;  . INCISION AND DRAINAGE OF WOUND Right 1970's   "leg" (04/08/2013)  . LIVER BIOPSY  ~ 2011  . PROCTOSCOPY N/A 09/08/2016   Procedure: RIGID PROCTOSCOPY;  Surgeon: Michael Boston, MD;  Location: [facility];  Service: General;  Laterality: N/A;  . TONSILLECTOMY       Family History  Problem Relation Age of Onset  . Asthma Mother   . Heart attack Mother      Social History   Socioeconomic History  . Marital status: Married    Spouse name: Not on file  . Number of children: 2  . Years of education: Not on file  . Highest education level: Not on file  Occupational History  . Occupation: truck Education administrator: [employer]    Comment: retired  Scientific laboratory technician  . Financial resource strain: Not on file  . Food insecurity:    Worry: Not on file    Inability: Not  on file  . Transportation needs:    Medical: Not on file    Non-medical: Not on file  Tobacco Use  . Smoking status: Current Every Day Smoker    Packs/day: 0.25    Years: 45.00    Pack years: 11.25    Types: Cigarettes  . Smokeless tobacco: Never Used  Substance and Sexual Activity  . Alcohol use: Yes    Comment: 04/08/2013 "haven't drank nothing in > 3 yr; before then I'd have at least 1 pint/day"  . Drug use: No  . Sexual activity: Yes  Lifestyle  . Physical activity:    Days per week: Not on file    Minutes per session: Not on file  . Stress: Not on file   Relationships  . Social connections:    Talks on phone: Not on file    Gets together: Not on file    Attends religious service: Not on file    Active member of club or organization: Not on file    Attends meetings of clubs or organizations: Not on file    Relationship status: Not on file  . Intimate partner violence:    Fear of current or ex partner: Not on file    Emotionally abused: Not on file    Physically abused: Not on file    Forced sexual activity: Not on file  Other Topics Concern  . Not on file  Social History Narrative   Pt lives in 1 story home with his wife   Has 2 adult children   Highest level of education: GED & Trade school   Retired Media planner.      Allergies  Allergen Reactions  . Penicillins Anaphylaxis and Other (See Comments)    Has patient had a PCN reaction causing immediate rash, facial/tongue/throat swelling, SOB or lightheadedness with hypotension: Yes Has patient had a PCN reaction causing severe rash involving mucus membranes or skin necrosis: No Has patient had a PCN reaction that required hospitalization No Has patient had a PCN reaction occurring within the last 10 years: No If all of the above answers are "NO", then may proceed with Cephalosporin use.  . Shellfish Allergy Anaphylaxis     Prior to Admission medications   Medication Sig Start Date End Date Taking? Authorizing Provider  finasteride (PROSCAR) 5 MG tablet Take 5 mg by mouth daily. 09/22/17  Yes [provider]  losartan-hydrochlorothiazide (HYZAAR) 100-12.5 MG tablet Take 0.5 tablets by mouth daily. Disregard rx for 1 tab QD. Should be 1/2 tab qd. 11/01/18 01/30/19 Yes Wendie Agreste, MD  tamsulosin (FLOMAX) 0.4 MG CAPS capsule Take 0.4 mg by mouth at bedtime.    Yes [provider]  varenicline (CHANTIX CONTINUING MONTH PAK) 1 MG tablet Take 1 tablet (1 mg total) by mouth 2 (two) times daily. Patient not taking: Reported on 11/19/2018 11/01/18   Wendie Agreste, MD  varenicline (CHANTIX STARTING MONTH PAK) 0.5 MG X 11 & 1 MG X 42 tablet Take one 0.5 mg tablet by mouth once daily for 3 days, then increase to one 0.5 mg tablet twice daily for 4 days, then increase to one 1 mg tablet twice daily. Patient not taking: Reported on 11/19/2018 11/01/18   Wendie Agreste, MD     Depression screen [facility] 2/9 11/19/2018 11/01/2018 10/12/2018 09/05/2018 04/27/2018  Decreased Interest 0 0 0 0 0  Down, Depressed, Hopeless 0 0 0 0 0  PHQ - 2 Score 0 0 0  0 0  Altered sleeping 0 - - - -  Tired, decreased energy 0 - - - -  Change in appetite 0 - - - -  Feeling bad or failure about yourself  0 - - - -  Trouble concentrating 0 - - - -  Moving slowly or fidgety/restless 0 - - - -  Suicidal thoughts 0 - - - -  PHQ-9 Score 0 - - - -     Fall Risk  11/19/2018 11/01/2018 10/12/2018 09/05/2018 04/27/2018  Falls in the past year? 0 0 0 0 No  Number falls in past yr: 0 0 - - -  Injury with Fall? 0 0 - - -  Follow up - Falls evaluation completed Falls evaluation completed - -      PHYSICAL EXAM: BP (!) 147/70 Comment: per patient taken at home  Ht 5\' 8"  (1.727 m)   Wt 176 lb (79.8 kg)   BMI 26.76 kg/m    Wt Readings from Last 3 Encounters:  11/19/18 176 lb (79.8 kg)  11/01/18 176 lb 9.6 oz (80.1 kg)  10/12/18 176 lb 9.6 oz (80.1 kg)     No exam data present    Physical Exam   Education/Counseling provided regarding diet and exercise, prevention of chronic diseases, smoking/tobacco cessation, if applicable, and reviewed "Covered Medicare Preventive Services."   ASSESSMENT/PLAN: 1. Medicare annual wellness visit, subsequent   Lives in one story home with home with wife   No scattered rugs No grab bars Well lit.  No trouble climbing stairs  No trouble getting up from a chair  Exercise doing squats , lifts 50 pound weights, does twist everyday.  Discussed adding cardio into routine.       I have reviewed and agree with the above AWV  documentation. Irma. Reubin Milan, MD

## 2018-11-19 NOTE — Patient Instructions (Addendum)
Thank you for taking time to come for your Medicare Wellness Visit. I appreciate your ongoing commitment to your health goals. Please review the following plan we discussed and let me know if I can assist you in the future.  Phillip Holleran LPN  Healthy Eating Following a healthy eating pattern may help you to achieve and maintain a healthy body weight, reduce the risk of chronic disease, and live a long and productive life. It is important to follow a healthy eating pattern at an appropriate calorie level for your body. Your nutritional needs should be met primarily through food by choosing a variety of nutrient-rich foods. What are tips for following this plan? Reading food labels  Read labels and choose the following: ? Reduced or low sodium. ? Juices with 100% fruit juice. ? Foods with low saturated fats and high polyunsaturated and monounsaturated fats. ? Foods with whole grains, such as whole wheat, cracked wheat, brown rice, and wild rice. ? Whole grains that are fortified with folic acid. This is recommended for women who are pregnant or who want to become pregnant.  Read labels and avoid the following: ? Foods with a lot of added sugars. These include foods that contain brown sugar, corn sweetener, corn syrup, dextrose, fructose, glucose, high-fructose corn syrup, honey, invert sugar, lactose, malt syrup, maltose, molasses, raw sugar, sucrose, trehalose, or turbinado sugar.  Do not eat more than the following amounts of added sugar per day:  6 teaspoons (25 g) for women.  9 teaspoons (38 g) for men. ? Foods that contain processed or refined starches and grains. ? Refined grain products, such as white flour, degermed cornmeal, white bread, and white rice. Shopping  Choose nutrient-rich snacks, such as vegetables, whole fruits, and nuts. Avoid high-calorie and high-sugar snacks, such as potato chips, fruit snacks, and candy.  Use oil-based dressings and spreads on foods instead of  solid fats such as butter, stick margarine, or cream cheese.  Limit pre-made sauces, mixes, and "instant" products such as flavored rice, instant noodles, and ready-made pasta.  Try more plant-protein sources, such as tofu, tempeh, black beans, edamame, lentils, nuts, and seeds.  Explore eating plans such as the Mediterranean diet or vegetarian diet. Cooking  Use oil to saut or stir-fry foods instead of solid fats such as butter, stick margarine, or lard.  Try baking, boiling, grilling, or broiling instead of frying.  Remove the fatty part of meats before cooking.  Steam vegetables in water or broth. Meal planning   At meals, imagine dividing your plate into fourths: ? One-half of your plate is fruits and vegetables. ? One-fourth of your plate is whole grains. ? One-fourth of your plate is protein, especially lean meats, poultry, eggs, tofu, beans, or nuts.  Include low-fat dairy as part of your daily diet. Lifestyle  Choose healthy options in all settings, including home, work, school, restaurants, or stores.  Prepare your food safely: ? Wash your hands after handling raw meats. ? Keep food preparation surfaces clean by regularly washing with hot, soapy water. ? Keep raw meats separate from ready-to-eat foods, such as fruits and vegetables. ? Cook seafood, meat, poultry, and eggs to the recommended internal temperature. ? Store foods at safe temperatures. In general:  Keep cold foods at 40F (4.4C) or below.  Keep hot foods at 140F (60C) or above.  Keep your freezer at 0F (-17.8C) or below.  Foods are no longer safe to eat when they have been between the temperatures of 40-140F (4.4-60C) for   eat when they have been between the temperatures of 40-140F (4.4-60C) for more than 2 hours. What foods should I eat? Fruits Aim to eat 2 cup-equivalents of fresh, canned (in natural juice), or frozen fruits each day. Examples of 1 cup-equivalent of fruit include 1 small apple, 8 large strawberries, 1 cup canned fruit,   cup dried fruit, or 1 cup 100% juice. Vegetables Aim to eat 2-3 cup-equivalents of fresh and frozen vegetables each day, including different varieties and colors. Examples of 1 cup-equivalent of vegetables include 2 medium carrots, 2 cups raw, leafy greens, 1 cup chopped vegetable (raw or cooked), or 1 medium baked potato. Grains Aim to eat 6 ounce-equivalents of whole grains each day. Examples of 1 ounce-equivalent of grains include 1 slice of bread, 1 cup ready-to-eat cereal, 3 cups popcorn, or  cup cooked rice, pasta, or cereal. Meats and other proteins Aim to eat 5-6 ounce-equivalents of protein each day. Examples of 1 ounce-equivalent of protein include 1 egg, 1/2 cup nuts or seeds, or 1 tablespoon (16 g) peanut butter. A cut of meat or fish that is the size of a deck of cards is about 3-4 ounce-equivalents.  Of the protein you eat each week, try to have at least 8 ounces come from seafood. This includes salmon, trout, herring, and anchovies. Dairy Aim to eat 3 cup-equivalents of fat-free or low-fat dairy each day. Examples of 1 cup-equivalent of dairy include 1 cup (240 mL) milk, 8 ounces (250 g) yogurt, 1 ounces (44 g) natural cheese, or 1 cup (240 mL) fortified soy milk. Fats and oils  Aim for about 5 teaspoons (21 g) per day. Choose monounsaturated fats, such as canola and olive oils, avocados, peanut butter, and most nuts, or polyunsaturated fats, such as sunflower, corn, and soybean oils, walnuts, pine nuts, sesame seeds, sunflower seeds, and flaxseed. Beverages  Aim for six 8-oz glasses of water per day. Limit coffee to three to five 8-oz cups per day.  Limit caffeinated beverages that have added calories, such as soda and energy drinks.  Limit alcohol intake to no more than 1 drink a day for nonpregnant women and 2 drinks a day for men. One drink equals 12 oz of beer (355 mL), 5 oz of wine (148 mL), or 1 oz of hard liquor (44 mL). Seasoning and other foods  Avoid adding  excess amounts of salt to your foods. Try flavoring foods with herbs and spices instead of salt.  Avoid adding sugar to foods.  Try using oil-based dressings, sauces, and spreads instead of solid fats. This information is based on general [place] nutrition guidelines. For more information, visit choosemyplate.gov. Exact amounts may vary based on your nutrition needs. Summary  A healthy eating plan may help you to maintain a healthy weight, reduce the risk of chronic diseases, and stay active throughout your life.  Plan your meals. Make sure you eat the right portions of a variety of nutrient-rich foods.  Try baking, boiling, grilling, or broiling instead of frying.  Choose healthy options in all settings, including home, work, school, restaurants, or stores. This information is not intended to replace advice given to you by your health care provider. Make sure you discuss any questions you have with your health care provider. Document Released: 11/13/2017 Document Revised: 11/13/2017 Document Reviewed: 11/13/2017 Elsevier Interactive Patient Education  2019 [employer] Advance Directive  Advance directives are legal documents that let you make choices ahead of time about your health care and medical treatment in case   you become unable to communicate for yourself. Advance directives are a way for you to communicate your wishes to family, friends, and health care providers. This can help convey your decisions about end-of-life care if you become unable to communicate. Discussing and writing advance directives should happen over time rather than all at once. Advance directives can be changed depending on your situation and what you want, even after you have signed the advance directives. If you do not have an advance directive, some states assign family decision makers to act on your behalf based on how closely you are related to them. Each state has its own laws regarding advance directives. You  may want to check with your health care provider, attorney, or state representative about the laws in your state. There are different types of advance directives, such as:  Medical power of attorney.  Living will.  Do not resuscitate (DNR) or do not attempt resuscitation (DNAR) order. Health care proxy and medical power of attorney A health care proxy, also called a health care agent, is a person who is appointed to make medical decisions for you in cases in which you are unable to make the decisions yourself. Generally, people choose someone they know well and trust to represent their preferences. Make sure to ask this person for an agreement to act as your proxy. A proxy may have to exercise judgment in the event of a medical decision for which your wishes are not known. A medical power of attorney is a legal document that names your health care proxy. Depending on the laws in your state, after the document is written, it may also need to be:  Signed.  Notarized.  Dated.  Copied.  Witnessed.  Incorporated into your medical record. You may also want to appoint someone to manage your financial affairs in a situation in which you are unable to do so. This is called a durable power of attorney for finances. It is a separate legal document from the durable power of attorney for health care. You may choose the same person or someone different from your health care proxy to act as your agent in financial matters. If you do not appoint a proxy, or if there is a concern that the proxy is not acting in your best interests, a court-appointed guardian may be designated to act on your behalf. Living will A living will is a set of instructions documenting your wishes about medical care when you cannot express them yourself. Health care providers should keep a copy of your living will in your medical record. You may want to give a copy to family members or friends. To alert caregivers in case of an  emergency, you can place a card in your wallet to let them know that you have a living will and where they can find it. A living will is used if you become:  Terminally ill.  Incapacitated.  Unable to communicate or make decisions. Items to consider in your living will include:  The use or non-use of life-sustaining equipment, such as dialysis machines and breathing machines (ventilators).  A DNR or DNAR order, which is the instruction not to use cardiopulmonary resuscitation (CPR) if breathing or heartbeat stops.  The use or non-use of tube feeding.  Withholding of food and fluids.  Comfort (palliative) care when the goal becomes comfort rather than a cure.  Organ and tissue donation. A living will does not give instructions for distributing your money and property if you should   pass away. It is recommended that you seek the advice of a lawyer when writing a will. Decisions about taxes, beneficiaries, and asset distribution will be legally binding. This process can relieve your family and friends of any concerns surrounding disputes or questions that may come up about the distribution of your assets. DNR or DNAR A DNR or DNAR order is a request not to have CPR in the event that your heart stops beating or you stop breathing. If a DNR or DNAR order has not been made and shared, a health care provider will try to help any patient whose heart has stopped or who has stopped breathing. If you plan to have surgery, talk with your health care provider about how your DNR or DNAR order will be followed if problems occur. Summary  Advance directives are the legal documents that allow you to make choices ahead of time about your health care and medical treatment in case you become unable to communicate for yourself.  The process of discussing and writing advance directives should happen over time. You can change the advance directives, even after you have signed them.  Advance directives include  DNR or DNAR orders, living wills, and designating an agent as your medical power of attorney. This information is not intended to replace advice given to you by your health care provider. Make sure you discuss any questions you have with your health care provider. Document Released: 11/08/2007 Document Revised: 06/20/2016 Document Reviewed: 06/20/2016 Elsevier Interactive Patient Education  2019 [employer] Inc[employer]  

## 2019-01-27 ENCOUNTER — Other Ambulatory Visit: Payer: Self-pay

## 2019-01-27 ENCOUNTER — Emergency Department (HOSPITAL_COMMUNITY)
Admission: EM | Admit: 2019-01-27 | Discharge: 2019-01-27 | Disposition: A | Discharge status: Home / Self Care | Payer: Medicare HMO | Attending: Emergency Medicine | Admitting: Emergency Medicine

## 2019-01-27 ENCOUNTER — Emergency Department (HOSPITAL_COMMUNITY): Payer: Medicare HMO

## 2019-01-27 ENCOUNTER — Encounter (HOSPITAL_COMMUNITY): Payer: Self-pay | Admitting: *Deleted

## 2019-01-27 DIAGNOSIS — F17210 Nicotine dependence, cigarettes, uncomplicated: Secondary | ICD-10-CM | POA: Diagnosis not present

## 2019-01-27 DIAGNOSIS — Z79899 Other long term (current) drug therapy: Secondary | ICD-10-CM | POA: Diagnosis not present

## 2019-01-27 DIAGNOSIS — R55 Syncope and collapse: Secondary | ICD-10-CM | POA: Diagnosis not present

## 2019-01-27 DIAGNOSIS — I10 Essential (primary) hypertension: Secondary | ICD-10-CM | POA: Insufficient documentation

## 2019-01-27 DIAGNOSIS — E119 Type 2 diabetes mellitus without complications: Secondary | ICD-10-CM | POA: Insufficient documentation

## 2019-01-27 DIAGNOSIS — R9431 Abnormal electrocardiogram [ECG] [EKG]: Secondary | ICD-10-CM | POA: Diagnosis not present

## 2019-01-27 DIAGNOSIS — R110 Nausea: Secondary | ICD-10-CM | POA: Diagnosis not present

## 2019-01-27 DIAGNOSIS — R42 Dizziness and giddiness: Secondary | ICD-10-CM | POA: Diagnosis not present

## 2019-01-27 DIAGNOSIS — J45909 Unspecified asthma, uncomplicated: Secondary | ICD-10-CM | POA: Insufficient documentation

## 2019-01-27 DIAGNOSIS — R61 Generalized hyperhidrosis: Secondary | ICD-10-CM | POA: Diagnosis not present

## 2019-01-27 LAB — CBC WITH DIFFERENTIAL/PLATELET
Abs Immature Granulocytes: 0.02 10*3/uL (ref 0.00–0.07)
Basophils Absolute: 0.1 10*3/uL (ref 0.0–0.1)
Basophils Relative: 1 %
Eosinophils Absolute: 0.7 10*3/uL — ABNORMAL HIGH (ref 0.0–0.5)
Eosinophils Relative: 10 %
HCT: 40.8 % (ref 39.0–52.0)
Hemoglobin: 13.3 g/dL (ref 13.0–17.0)
Immature Granulocytes: 0 %
Lymphocytes Relative: 29 %
Lymphs Abs: 1.9 10*3/uL (ref 0.7–4.0)
MCH: 30.6 pg (ref 26.0–34.0)
MCHC: 32.6 g/dL (ref 30.0–36.0)
MCV: 94 fL (ref 80.0–100.0)
Monocytes Absolute: 0.4 10*3/uL (ref 0.1–1.0)
Monocytes Relative: 6 %
Neutro Abs: 3.6 10*3/uL (ref 1.7–7.7)
Neutrophils Relative %: 54 %
Platelets: 275 10*3/uL (ref 150–400)
RBC: 4.34 MIL/uL (ref 4.22–5.81)
RDW: 13.6 % (ref 11.5–15.5)
WBC: 6.6 10*3/uL (ref 4.0–10.5)
nRBC: 0 % (ref 0.0–0.2)

## 2019-01-27 LAB — CBG MONITORING, ED: Glucose-Capillary: 111 mg/dL — ABNORMAL HIGH (ref 70–99)

## 2019-01-27 LAB — BASIC METABOLIC PANEL
Anion gap: 11 (ref 5–15)
BUN: 15 mg/dL (ref 8–23)
CO2: 22 mmol/L (ref 22–32)
Calcium: 8.8 mg/dL — ABNORMAL LOW (ref 8.9–10.3)
Chloride: 103 mmol/L (ref 98–111)
Creatinine, Ser: 1.16 mg/dL (ref 0.61–1.24)
GFR calc Af Amer: >60 mL/min (ref >60)
GFR calc non Af Amer: >60 mL/min (ref >60)
Glucose, Bld: 115 mg/dL — ABNORMAL HIGH (ref 70–99)
Potassium: 3.8 mmol/L (ref 3.5–5.1)
Sodium: 136 mmol/L (ref 135–145)

## 2019-01-27 LAB — TROPONIN I: Troponin I: <0.03 ng/mL (ref <0.03)

## 2019-01-27 MED ORDER — SODIUM CHLORIDE 0.9 % IV BOLUS
1000.0000 mL | Freq: Once | INTRAVENOUS | Status: AC
  Administered 2019-01-27: 1000 mL via INTRAVENOUS

## 2019-01-27 NOTE — ED Notes (Signed)
The pt is talking to his wife and family on the phone

## 2019-01-27 NOTE — ED Provider Notes (Signed)
[facility] EMERGENCY DEPARTMENT Provider Note   CSN: [id number] Arrival date & time: 01/27/19  1640     History   Chief Complaint Chief Complaint  Patient presents with  . Near Syncope    HPI Phillip Maynard. is a 78 y.o. male.     HPI  Patient is a 78 year old male with past medical history of hypertension, type 2 diabetes mellitus, no longer on antihyperglycemic's, hepatitis C, resolved, GERD, tobacco use presenting for near syncope.  Patient was approximately an hour prior to arrival he was standing over a grill preparing food when he began to feel "hot".  Patient ports he felt lightheaded and went inside to sit down in the cool air.  He reports he had 3 beers over the course the afternoon.  Patient reports drinking coffee this morning but no other fluids.  He had not eaten since breakfast.  Patient denies any chest pain, shortness of breath, headache, visual disturbance, nausea, vomiting, or syncope with this event.  He denies any recent mobilization, hospitalization, hormone use, cancer treatment, history of DVT/PE, recent surgery, hemoptysis, lower extremity edema or calf tenderness.  He does report that he remained diaphoretic until approximately the time that the paramedics arrived.  He reports he is symptom-free currently.  Past Medical History:  Diagnosis Date  . Asthma    "when I was a boy" (04/08/2013)  . Bronchitis   . Colon polyps   . Colovesical fistula   . Diverticulosis   . GERD (gastroesophageal reflux disease)   . Hepatitis C    treated with injections  . High cholesterol   . Hypertension   . Type II diabetes mellitus (HCC)    hx of . No longer on medicine    Patient Active Problem List   Diagnosis Date Noted  . Skin lesion 10/12/2018  . 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 Hypertension 02/28/2013  . Type 2 diabetes mellitus ([place]) 02/28/2013  . Chronic hepatitis C ([place]) 02/14/2011  . TOBACCO  ABUSE 12/10/2007  . 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 CYSTOSCOPY N/A 09/08/2016   Procedure: FIREFLY INJECTIONS;  Surgeon: Cleon Gustin, MD;  Location: [facility];  Service: Urology;  Laterality: N/A;  . INCISION AND DRAINAGE OF WOUND Right 1970's   "leg" (04/08/2013)  . LIVER BIOPSY  ~ 2011  . PROCTOSCOPY N/A 09/08/2016   Procedure: RIGID PROCTOSCOPY;  Surgeon: Michael Boston, MD;  Location: [facility];  Service: General;  Laterality: N/A;  . TONSILLECTOMY          Home Medications    Prior to Admission medications   Medication Sig Start Date End Date Taking? Authorizing Provider  finasteride (PROSCAR) 5 MG tablet Take 5 mg by mouth daily. 09/22/17   [provider]  losartan-hydrochlorothiazide (HYZAAR) 100-12.5 MG tablet Take 0.5 tablets by mouth daily. Disregard rx for 1 tab QD. Should be 1/2 tab qd. 11/01/18 01/30/19  Wendie Agreste, MD  tamsulosin (FLOMAX) 0.4 MG CAPS capsule Take 0.4 mg by mouth at bedtime.     [provider]  varenicline (CHANTIX CONTINUING MONTH PAK) 1 MG tablet Take 1 tablet (1 mg total) by mouth 2 (two) times daily. Patient not taking: Reported on 11/19/2018 11/01/18   Wendie Agreste, MD  varenicline (CHANTIX STARTING MONTH PAK) 0.5 MG X 11 & 1 MG X 42 tablet Take one 0.5 mg tablet by mouth once daily for 3 days,  then increase to one 0.5 mg tablet twice daily for 4 days, then increase to one 1 mg tablet twice daily. Patient not taking: Reported on 11/19/2018 11/01/18   Wendie Agreste, MD    Family History Family History  Problem Relation Age of Onset  . Asthma Mother   . Heart attack Mother     Social History Social History   Tobacco Use  . Smoking status: Current Every Day Smoker    Packs/day: 0.25    Years: 45.00    Pack years: 11.25    Types: Cigarettes  . Smokeless tobacco: Never Used  Substance Use Topics  . Alcohol use: Yes    Comment: 04/08/2013 "haven't drank nothing  in > 3 yr; before then I'd have at least 1 pint/day"  . Drug use: No     Allergies   Penicillins and Shellfish allergy   Review of Systems Review of Systems  Constitutional: Negative for chills and fever.  HENT: Negative for congestion and sore throat.   Eyes: Negative for visual disturbance.  Respiratory: Negative for cough, chest tightness and shortness of breath.   Cardiovascular: Negative for chest pain and palpitations.  Gastrointestinal: Negative for abdominal pain, diarrhea, nausea and vomiting.  Musculoskeletal: Negative for back pain and myalgias.  Skin: Negative for rash.  Neurological: Positive for light-headedness. Negative for dizziness, syncope and headaches.  All other systems reviewed and are negative.    Physical Exam Updated Vital Signs BP 132/74 (BP Location: Right Arm)   Pulse (!) 59   Temp 97.8 F (36.6 C) (Oral)   Resp 12   Ht 5\' 8"  (1.727 m)   Wt 77.1 kg   SpO2 96%   BMI 25.85 kg/m   Physical Exam Vitals signs and nursing note reviewed.  Constitutional:      General: He is not in acute distress.    Appearance: He is well-developed. He is not ill-appearing or diaphoretic.  HENT:     Head: Normocephalic and atraumatic.  Eyes:     Conjunctiva/sclera: Conjunctivae normal.     Pupils: Pupils are equal, round, and reactive to light.  Neck:     Musculoskeletal: Normal range of motion and neck supple.  Cardiovascular:     Rate and Rhythm: Normal rate and regular rhythm.     Pulses:          Radial pulses are 2+ on the right side and 2+ on the left side.       Dorsalis pedis pulses are 2+ on the right side and 2+ on the left side.     Heart sounds: S1 normal and S2 normal. No murmur.     Comments: No lower extremity edema.  No calf tenderness. Pulmonary:     Effort: Pulmonary effort is normal.     Breath sounds: Normal breath sounds. No wheezing or rales.  Abdominal:     General: There is no distension.     Palpations: Abdomen is soft.      Tenderness: There is no abdominal tenderness. There is no guarding.  Musculoskeletal: Normal range of motion.        General: No deformity.  Lymphadenopathy:     Cervical: No cervical adenopathy.  Skin:    General: Skin is warm and dry.     Findings: No erythema or rash.  Neurological:     Mental Status: He is alert.     Comments: Cranial nerves grossly intact. Patient moves extremities symmetrically and with good coordination.  Psychiatric:  Behavior: Behavior normal.        Thought Content: Thought content normal.        Judgment: Judgment normal.      ED Treatments / Results  Labs (all labs ordered are listed, but only abnormal results are displayed) Labs Reviewed  BASIC METABOLIC PANEL - Abnormal; Notable for the following components:      Result Value   Glucose, Bld 115 (*)    Calcium 8.8 (*)    All other components within normal limits  CBC WITH DIFFERENTIAL/PLATELET - Abnormal; Notable for the following components:   Eosinophils Absolute 0.7 (*)    All other components within normal limits  CBG MONITORING, ED - Abnormal; Notable for the following components:   Glucose-Capillary 111 (*)    All other components within normal limits  TROPONIN I    EKG EKG Interpretation  Date/Time:  Sunday January 27 2019 16:41:09 EDT Ventricular Rate:  56 PR Interval:    QRS Duration: 139 QT Interval:  447 QTC Calculation: 432 R Axis:   -53 Text Interpretation:  Sinus rhythm RBBB and LAFB No significant change was found Confirmed by Jola Schmidt [phone]) on 01/27/2019 5:06:34 PM   Radiology No results found.  Procedures Procedures (including critical care time)  Medications Ordered in ED Medications  sodium chloride 0.9 % bolus 1,000 mL (has no administration in time range)     Initial Impression / Assessment and Plan / ED Course  I have reviewed the triage vital signs and the nursing notes.  Pertinent labs & imaging results that were available during my care  of the patient were reviewed by me and considered in my medical decision making (see chart for details).  Clinical Course as of Jan 26 1733  Sun Jan 27, 2019  1734 Glucose-Capillary(!): 111 [AM]    Clinical Course User Index [AM] Albesa Seen, [place]       DDx includes: Orthostatic hypotension Stroke Vertebral artery dissection/stenosis Dysrhythmia PE Vasovagal/neurocardiogenic syncope Aortic stenosis Valvular disorder/Cardiomyopathy Anemia  Patient is well-appearing, hemodynamically stable, and in no acute distress.  He is symptom-free currently.  His EKG is consistent with prior evidence of acute ischemic changes, reviewed by me.  Will check lab work and hydrate patient.   Work-up unremarkable.  He has no hypoglycemia.  No electrode abnormalities.  No elevation in troponin.  EKG consistent with prior with stable right bundle branch block.  No acute ischemic changes.    Patient hydrated with 1 L of normal saline.  He reports he feels back to his baseline.  He ambulated in the emergency department is tolerating p.o.  He feels stable for discharge.  He will follow-up with his primary care provider.  Patient given return precautions for any chest pain, shortness of breath, return of syncopal sensations.  He is in understanding and agrees with the plan of care.  This is a shared visit with Dr. Jola Schmidt. Patient was independently evaluated by this attending physician. Attending physician consulted in evaluation and discharge management.  Final Clinical Impressions(s) / ED Diagnoses   Final diagnoses:  Near syncope    ED Discharge Orders    None       Tamala Julian 01/27/19 2342    Jola Schmidt, MD 01/28/19 1409

## 2019-01-27 NOTE — ED Notes (Signed)
To x-ray

## 2019-01-27 NOTE — Discharge Instructions (Addendum)
Please see the information and instructions below regarding your visit.  Your diagnoses today include:  1. Near syncope     Tests performed today include: See side panel of your discharge paperwork for testing performed today. Vital signs are listed at the bottom of these instructions.   Medications prescribed:    Take any prescribed medications only as prescribed, and any over the counter medications only as directed on the packaging.  Home care instructions:  Please follow any educational materials contained in this packet.   When you go home tonight, please make sure that you drink couple glasses of water.  Your urine should be pale yellow in color.  Then you know that you are hydrated.  Follow-up instructions: Please follow-up with your primary care provider in 5-7 days for further evaluation of your symptoms if they are not completely improved.    Return instructions:  Please return to the Emergency Department if you experience worsening symptoms.  Please come back to the emergency department he develop any chest pain, shortness of breath, or further feeling that you are going to pass out. Please return if you have any other emergent concerns.  Additional Information:   Your vital signs today were: BP (!) 153/80 (BP Location: Right Arm)    Pulse 63    Temp 97.8 F (36.6 C) (Oral)    Resp 18    Ht 5\' 8"  (1.727 m)    Wt 77.1 kg    SpO2 93%    BMI 25.85 kg/m  If your blood pressure (BP) was elevated on multiple readings during this visit above 130 for the top number or above 80 for the bottom number, please have this repeated by your primary care provider within one month. --------------  Thank you for allowing [place] to participate in your care today.

## 2019-01-27 NOTE — ED Triage Notes (Signed)
The pt arrived by gems from home  The pt had been outside grilling for several hours and he drank 3 beers,  He became dixxy and sweaty and almost passed ouot.Marland Kitchen  He came back into the house and just kept sweating.  Alert oriiented  Skin warm and dry  He feels normal now

## 2019-01-27 NOTE — ED Notes (Signed)
gingerale given  Oral trial

## 2019-03-27 DIAGNOSIS — D235 Other benign neoplasm of skin of trunk: Secondary | ICD-10-CM | POA: Diagnosis not present

## 2019-03-27 DIAGNOSIS — R52 Pain, unspecified: Secondary | ICD-10-CM | POA: Diagnosis not present

## 2019-03-29 DIAGNOSIS — E119 Type 2 diabetes mellitus without complications: Secondary | ICD-10-CM | POA: Diagnosis not present

## 2019-03-29 DIAGNOSIS — I10 Essential (primary) hypertension: Secondary | ICD-10-CM | POA: Diagnosis not present

## 2019-03-29 DIAGNOSIS — H18413 Arcus senilis, bilateral: Secondary | ICD-10-CM | POA: Diagnosis not present

## 2019-03-29 DIAGNOSIS — H25093 Other age-related incipient cataract, bilateral: Secondary | ICD-10-CM | POA: Diagnosis not present

## 2019-03-29 DIAGNOSIS — H52223 Regular astigmatism, bilateral: Secondary | ICD-10-CM | POA: Diagnosis not present

## 2019-03-29 DIAGNOSIS — H524 Presbyopia: Secondary | ICD-10-CM | POA: Diagnosis not present

## 2019-03-29 DIAGNOSIS — H11153 Pinguecula, bilateral: Secondary | ICD-10-CM | POA: Diagnosis not present

## 2019-03-29 DIAGNOSIS — H5203 Hypermetropia, bilateral: Secondary | ICD-10-CM | POA: Diagnosis not present

## 2019-03-29 LAB — HM DIABETES EYE EXAM

## 2019-04-04 ENCOUNTER — Telehealth (INDEPENDENT_AMBULATORY_CARE_PROVIDER_SITE_OTHER): Payer: Medicare HMO | Admitting: Family Medicine

## 2019-04-04 ENCOUNTER — Emergency Department (HOSPITAL_COMMUNITY)
Admission: EM | Admit: 2019-04-04 | Discharge: 2019-04-05 | Disposition: A | Discharge status: Home / Self Care | Payer: Medicare HMO | Attending: Emergency Medicine | Admitting: Emergency Medicine

## 2019-04-04 ENCOUNTER — Emergency Department (HOSPITAL_COMMUNITY): Payer: Medicare HMO

## 2019-04-04 ENCOUNTER — Encounter (HOSPITAL_COMMUNITY): Payer: Self-pay

## 2019-04-04 ENCOUNTER — Other Ambulatory Visit: Payer: Self-pay

## 2019-04-04 DIAGNOSIS — R0601 Orthopnea: Secondary | ICD-10-CM

## 2019-04-04 DIAGNOSIS — R0602 Shortness of breath: Secondary | ICD-10-CM | POA: Diagnosis not present

## 2019-04-04 DIAGNOSIS — I10 Essential (primary) hypertension: Secondary | ICD-10-CM

## 2019-04-04 DIAGNOSIS — E119 Type 2 diabetes mellitus without complications: Secondary | ICD-10-CM | POA: Diagnosis not present

## 2019-04-04 DIAGNOSIS — F17210 Nicotine dependence, cigarettes, uncomplicated: Secondary | ICD-10-CM | POA: Diagnosis not present

## 2019-04-04 DIAGNOSIS — Z79899 Other long term (current) drug therapy: Secondary | ICD-10-CM | POA: Insufficient documentation

## 2019-04-04 DIAGNOSIS — R05 Cough: Secondary | ICD-10-CM

## 2019-04-04 DIAGNOSIS — K219 Gastro-esophageal reflux disease without esophagitis: Secondary | ICD-10-CM | POA: Diagnosis not present

## 2019-04-04 DIAGNOSIS — Z8639 Personal history of other endocrine, nutritional and metabolic disease: Secondary | ICD-10-CM | POA: Diagnosis not present

## 2019-04-04 DIAGNOSIS — R0600 Dyspnea, unspecified: Secondary | ICD-10-CM | POA: Diagnosis not present

## 2019-04-04 DIAGNOSIS — R059 Cough, unspecified: Secondary | ICD-10-CM

## 2019-04-04 DIAGNOSIS — R001 Bradycardia, unspecified: Secondary | ICD-10-CM | POA: Diagnosis not present

## 2019-04-04 LAB — COMPREHENSIVE METABOLIC PANEL
ALT: 16 U/L (ref 0–44)
AST: 16 U/L (ref 15–41)
Albumin: 3.8 g/dL (ref 3.5–5.0)
Alkaline Phosphatase: 50 U/L (ref 38–126)
Anion gap: 12 (ref 5–15)
BUN: 12 mg/dL (ref 8–23)
CO2: 24 mmol/L (ref 22–32)
Calcium: 9.5 mg/dL (ref 8.9–10.3)
Chloride: 102 mmol/L (ref 98–111)
Creatinine, Ser: 1.04 mg/dL (ref 0.61–1.24)
GFR calc Af Amer: >60 mL/min (ref >60)
GFR calc non Af Amer: >60 mL/min (ref >60)
Glucose, Bld: 108 mg/dL — ABNORMAL HIGH (ref 70–99)
Potassium: 3.9 mmol/L (ref 3.5–5.1)
Sodium: 138 mmol/L (ref 135–145)
Total Bilirubin: 0.8 mg/dL (ref 0.3–1.2)
Total Protein: 7.6 g/dL (ref 6.5–8.1)

## 2019-04-04 LAB — CBC WITH DIFFERENTIAL/PLATELET
Abs Immature Granulocytes: 0.01 10*3/uL (ref 0.00–0.07)
Basophils Absolute: 0.1 10*3/uL (ref 0.0–0.1)
Basophils Relative: 1 %
Eosinophils Absolute: 1.1 10*3/uL — ABNORMAL HIGH (ref 0.0–0.5)
Eosinophils Relative: 13 %
HCT: 44.1 % (ref 39.0–52.0)
Hemoglobin: 14.5 g/dL (ref 13.0–17.0)
Immature Granulocytes: 0 %
Lymphocytes Relative: 36 %
Lymphs Abs: 3.1 10*3/uL (ref 0.7–4.0)
MCH: 30.7 pg (ref 26.0–34.0)
MCHC: 32.9 g/dL (ref 30.0–36.0)
MCV: 93.2 fL (ref 80.0–100.0)
Monocytes Absolute: 0.7 10*3/uL (ref 0.1–1.0)
Monocytes Relative: 8 %
Neutro Abs: 3.6 10*3/uL (ref 1.7–7.7)
Neutrophils Relative %: 42 %
Platelets: 252 10*3/uL (ref 150–400)
RBC: 4.73 MIL/uL (ref 4.22–5.81)
RDW: 13.9 % (ref 11.5–15.5)
WBC: 8.5 10*3/uL (ref 4.0–10.5)
nRBC: 0 % (ref 0.0–0.2)

## 2019-04-04 LAB — BRAIN NATRIURETIC PEPTIDE: B Natriuretic Peptide: 94.4 pg/mL (ref 0.0–100.0)

## 2019-04-04 NOTE — Progress Notes (Signed)
CC-Cough and tightness in chest- Tightness in chest is mostly at night. Also  have congestion at night, wheezing, cough,and sob since Monday 04/01/19. Would like to get covid test.

## 2019-04-04 NOTE — ED Notes (Signed)
Pt up to nurse first, ambulatory without distress saying he needs to leave, he "was just here for an xray". Explained to pt that even though testing has been done, he needs to be evaluated by EDP. Pt agreeable to stay at this time.

## 2019-04-04 NOTE — Progress Notes (Signed)
Virtual Visit via Telephone Note Based on current symptoms and Covid pandemic, virtual visit performed.   No answer at 4:58 PM will try to reach again.   I connected with Phillip Maynard. on 04/04/19 at 4:52 PM by telephone and verified that I am speaking with the correct person using two identifiers.   I discussed the limitations, risks, security and privacy concerns of performing an evaluation and management service by telephone and the availability of in person appointments. I also discussed with the patient that there may be a patient responsible charge related to this service. The patient expressed understanding and agreed to proceed, consent obtained  Chief complaint:  Cough, chest tightness.   History of Present Illness: Phillip Maynard. is a 78 y.o. male  Feels ok during the day, some smoker's cough - dry cough during the day. About the same.  Notes chest congestion at night and shortness of breath with lying down. Tries to lay down at night - ok on 2 pillow, but wheezing and dyspnea with lying flat. Wakes up during the night short of breath when flat. Able to sleep sitting up, but short of breath lying flat. Chest is tight, but no pain. Feels like tightness in chest.  Wheezing is worse lying flat.  No leg swelling.  Some heartburn this week, maybe before as well. maalox during day. No worsening heartburn at night. No inhaler currently.  No dyspnea on exertion.   No fever.  No bodyaches.  No change in taste/smell.  No n/v/d, no abd pain.  No known sick contacts, no known covid exposure.   Tx: coricidin HBP - some improvement.   Evaluated June 14 in ER for near syncope.  EKG with sinus rhythm, RBBB, LAFB without significant changes.  Atelectasis on chest x-ray , mild hyperexpansion, but no acute cardiopulmonary disease, normal troponin.   CXR 04/22/18 - Mild pulmonary vascular congestion without edema or effusion.  Repeat xray 5 days later normal vascularity.    History of hypertension. Exercise treadmill test in 2016, mild hypertension response, otherwise normal.   Bronchitis treatment in September 2019 with prednisone, Tessalon, Proventil HFA.   Cardiac risk factors.  Age, HTN, DM.  Lab Results  Component Value Date   HGBA1C 6.4 (H) 11/01/2018     Patient Active Problem List   Diagnosis Date Noted  . Skin lesion 10/12/2018  . 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 Hypertension 02/28/2013  . Type 2 diabetes mellitus ([place]) 02/28/2013  . Chronic hepatitis C ([place]) 02/14/2011  . TOBACCO ABUSE 12/10/2007  . 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 Asthma    "when I was a boy" (04/08/2013)  . Bronchitis   . Colon polyps   . Colovesical fistula   . Diverticulosis   . GERD (gastroesophageal reflux disease)   . Hepatitis C    treated with injections  . High cholesterol   . Hypertension   . Type II diabetes mellitus (HCC)    hx of . No longer on medicine   Past Surgical History:  Procedure Laterality Date  . CYSTOSCOPY N/A 09/08/2016   Procedure: FIREFLY INJECTIONS;  Surgeon: Phillip Gustin, MD;  Location: [facility];  Service: Urology;  Laterality: N/A;  . INCISION AND DRAINAGE OF WOUND Right 1970's   "leg" (04/08/2013)  . LIVER BIOPSY  ~ 2011  . PROCTOSCOPY N/A 09/08/2016   Procedure: RIGID PROCTOSCOPY;  Surgeon: Phillip Boston, MD;  Location: [facility];  Service: General;  Laterality: N/A;  . TONSILLECTOMY     Allergies  Allergen Reactions  . Penicillins Anaphylaxis and Other (See Comments)    Has patient had a PCN reaction causing immediate rash, facial/tongue/throat swelling, SOB or lightheadedness with hypotension: Yes Has patient had a PCN reaction causing severe rash involving mucus membranes or skin necrosis: No Has patient had a PCN reaction that required hospitalization No Has patient had a PCN reaction occurring within the last 10 years: No If all of the  above answers are "NO", then may proceed with Cephalosporin use.  . Shellfish Allergy Anaphylaxis   Prior to Admission medications   Medication Sig Start Date End Date Taking? Authorizing Provider  finasteride (PROSCAR) 5 MG tablet Take 5 mg by mouth daily. 09/22/17  Yes [provider]  tamsulosin (FLOMAX) 0.4 MG CAPS capsule Take 0.4 mg by mouth at bedtime.    Yes [provider]  losartan-hydrochlorothiazide (HYZAAR) 100-12.5 MG tablet Take 0.5 tablets by mouth daily. Disregard rx for 1 tab QD. Should be 1/2 tab qd. 11/01/18 01/30/19  Phillip Agreste, MD   Social History   Socioeconomic History  . Marital status: Married    Spouse name: Not on file  . Number of children: 2  . Years of education: Not on file  . Highest education level: Not on file  Occupational History  . Occupation: truck Education administrator: [employer]    Comment: retired  Scientific laboratory technician  . Financial resource strain: Not on file  . Food insecurity    Worry: Not on file    Inability: Not on file  . Transportation needs    Medical: Not on file    Non-medical: Not on file  Tobacco Use  . Smoking status: Current Every Day Smoker    Packs/day: 0.25    Years: 45.00    Pack years: 11.25    Types: Cigarettes  . Smokeless tobacco: Never Used  Substance and Sexual Activity  . Alcohol use: Yes    Comment: 04/08/2013 "haven't drank nothing in > 3 yr; before then I'd have at least 1 pint/day"  . Drug use: No  . Sexual activity: Yes  Lifestyle  . Physical activity    Days per week: Not on file    Minutes per session: Not on file  . Stress: Not on file  Relationships  . Social Herbalist on phone: Not on file    Gets together: Not on file    Attends religious service: Not on file    Active member of club or organization: Not on file    Attends meetings of clubs or organizations: Not on file    Relationship status: Not on file  . Intimate partner violence    Fear of current or ex  partner: Not on file    Emotionally abused: Not on file    Physically abused: Not on file    Forced sexual activity: Not on file  Other Topics Concern  . Not on file  Social History Narrative   Pt lives in 1 story home with his wife   Has 2 adult children   Highest level of education: GED & Trade school   Retired Media planner.      Observations/Objective: Speaking in full sentence without acute distress.  No audible wheeze. Appropriate responses, no cough heard on evaluation on phone. There were no vitals filed for this visit.  Assessment and Plan: PND (paroxysmal nocturnal dyspnea)  Orthopnea  Cough  Shortness of breath  Hypertension, unspecified type  History of diet-controlled diabetes  3-day history of cough, congestion, shortness of breath, noted with lying down at night only, along with paroxysmal nocturnal dyspnea.  No fever or other infectious symptoms, no known exposure to COVID-19 or other sick contacts at home.  Does report some heartburn symptoms, but does not report worsening at night.  No wheezing/dyspnea during the day.  -Differential includes infectious cause with potential need for COVID-19 testing.  -Differential includes reactive airway/COPD given wheezing and prior need for albuterol/nebulizer treatment and prednisone last year with bronchitis.  -However with hypertension, diabetes, age and prior pulmonary venous congestion noted on x-ray last year, with PND/orthopnea, concern for new onset CHF given PND and orthopnea.  Denies chest pain or known event but persistent symptoms past few nights.  Recommended emergency room evaluation for possible troponin, chest x-ray, possible BNP and likely COVID-19 testing, but workup TBD at ER.  Agrees with plan.  Can follow-up with our office afterward if testing reassuring.  Follow Up Instructions: Er eval tonight.    I discussed the assessment and treatment plan with the patient. The patient was provided an  opportunity to ask questions and all were answered. The patient agreed with the plan and demonstrated an understanding of the instructions.   The patient was advised to call back or seek an in-person evaluation if the symptoms worsen or if the condition fails to improve as anticipated.  I provided 17 minutes of non-face-to-face time during this encounter.  Signed,   [name], MD [facility].  04/04/19

## 2019-04-04 NOTE — ED Triage Notes (Signed)
Pt arrives POV for eval of orthopnea x 4 days w/ associated cough. Pt reports chest pressure w/ orthopnea, denies known hx of CHF or COPD. Denies SOB during the day

## 2019-04-05 ENCOUNTER — Encounter: Payer: Self-pay | Admitting: Family Medicine

## 2019-04-05 ENCOUNTER — Encounter (HOSPITAL_COMMUNITY): Payer: Self-pay | Admitting: Emergency Medicine

## 2019-04-05 LAB — TROPONIN I (HIGH SENSITIVITY): Troponin I (High Sensitivity): 7 ng/L (ref <18)

## 2019-04-05 MED ORDER — PANTOPRAZOLE SODIUM 40 MG PO TBEC
40.0000 mg | DELAYED_RELEASE_TABLET | Freq: Every day | ORAL | Status: DC
  Administered 2019-04-05: 01:00:00 40 mg via ORAL
  Filled 2019-04-05: qty 1

## 2019-04-05 MED ORDER — ALUM & MAG HYDROXIDE-SIMETH 200-200-20 MG/5ML PO SUSP
30.0000 mL | Freq: Once | ORAL | Status: AC
  Administered 2019-04-05: 30 mL via ORAL
  Filled 2019-04-05: qty 30

## 2019-04-05 MED ORDER — PANTOPRAZOLE SODIUM 40 MG PO TBEC: 40.0000 mg | DELAYED_RELEASE_TABLET | Freq: Every day | ORAL | 0 refills | Status: DC

## 2019-04-05 NOTE — ED Provider Notes (Signed)
[facility] EMERGENCY DEPARTMENT Provider Note   CSN: [id number] Arrival date & time: 04/04/19  1742     History   Chief Complaint Chief Complaint  Patient presents with  . Orthopnea  . Cough    HPI Phillip Maynard. is a 78 y.o. male.     The history is provided by the patient.  Cough Cough characteristics:  Non-productive (only at night lying flat.  ) Severity:  Mild Onset quality:  Gradual Timing:  Intermittent Progression:  Unchanged Chronicity:  New Context: not animal exposure, not exposure to allergens, not fumes and not sick contacts   Relieved by:  Nothing Worsened by:  Nothing Ineffective treatments:  None tried Associated symptoms: no chest pain, no chills, no diaphoresis, no ear fullness and no eye discharge   Risk factors: no chemical exposure   Also pain with swallowing and wakes up feeling SOB.  No CP, no DOE.  No leg pain or swelling.  Has GERD but is not on medication.  Sometimes has a bitter taste.    Past Medical History:  Diagnosis Date  . Asthma    "when I was a boy" (04/08/2013)  . Bronchitis   . Colon polyps   . Colovesical fistula   . Diverticulosis   . GERD (gastroesophageal reflux disease)   . Hepatitis C    treated with injections  . High cholesterol   . Hypertension   . Type II diabetes mellitus (HCC)    hx of . No longer on medicine    Patient Active Problem List   Diagnosis Date Noted  . Skin lesion 10/12/2018  . 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 Hypertension 02/28/2013  . Type 2 diabetes mellitus ([place]) 02/28/2013  . Chronic hepatitis C ([place]) 02/14/2011  . TOBACCO ABUSE 12/10/2007  . 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 CYSTOSCOPY N/A 09/08/2016   Procedure: FIREFLY INJECTIONS;  Surgeon: Cleon Gustin, MD;  Location: [facility];  Service: Urology;  Laterality: N/A;  . INCISION AND DRAINAGE OF WOUND Right  1970's   "leg" (04/08/2013)  . LIVER BIOPSY  ~ 2011  . PROCTOSCOPY N/A 09/08/2016   Procedure: RIGID PROCTOSCOPY;  Surgeon: Michael Boston, MD;  Location: [facility];  Service: General;  Laterality: N/A;  . TONSILLECTOMY          Home Medications    Prior to Admission medications   Medication Sig Start Date End Date Taking? Authorizing Provider  finasteride (PROSCAR) 5 MG tablet Take 5 mg by mouth daily. 09/22/17   [provider]  losartan-hydrochlorothiazide (HYZAAR) 100-12.5 MG tablet Take 0.5 tablets by mouth daily. Disregard rx for 1 tab QD. Should be 1/2 tab qd. 11/01/18 01/30/19  Wendie Agreste, MD  tamsulosin (FLOMAX) 0.4 MG CAPS capsule Take 0.4 mg by mouth at bedtime.     [provider]    Family History Family History  Problem Relation Age of Onset  . Asthma Mother   . Heart attack Mother     Social History Social History   Tobacco Use  . Smoking status: Current Every Day Smoker    Packs/day: 0.25    Years: 45.00    Pack years: 11.25    Types: Cigarettes  . Smokeless tobacco: Never Used  Substance Use Topics  . Alcohol use: Yes    Comment: 04/08/2013 "haven't drank nothing in > 3 yr; before then I'd have at least 1 pint/day"  .  Drug use: No     Allergies   Penicillins and Shellfish allergy   Review of Systems Review of Systems  Constitutional: Negative for chills and diaphoresis.  HENT: Negative for congestion, dental problem, trouble swallowing and voice change.   Eyes: Negative for discharge.  Respiratory: Positive for cough.   Cardiovascular: Negative for chest pain, palpitations and leg swelling.  Gastrointestinal: Negative for abdominal pain.  Genitourinary: Negative for difficulty urinating.  Musculoskeletal: Negative for arthralgias.  Neurological: Negative for dizziness.  Psychiatric/Behavioral: Negative for agitation.  All other systems reviewed and are negative.    Physical Exam Updated Vital Signs BP (!) 167/74   Pulse  (!) 50   Temp 98.4 F (36.9 C) (Oral)   Resp 17   Ht 5\' 9"  (1.753 m)   Wt 80.7 kg   SpO2 98%   BMI 26.29 kg/m   Physical Exam Vitals signs and nursing note reviewed.  Constitutional:      General: He is not in acute distress.    Appearance: He is normal weight.  HENT:     Head: Normocephalic and atraumatic.     Nose: Nose normal.     Mouth/Throat:     Mouth: Mucous membranes are moist.     Pharynx: Oropharynx is clear.  Eyes:     Conjunctiva/sclera: Conjunctivae normal.     Pupils: Pupils are equal, round, and reactive to light.  Neck:     Musculoskeletal: Normal range of motion and neck supple.  Cardiovascular:     Rate and Rhythm: Normal rate and regular rhythm.     Pulses: Normal pulses.     Heart sounds: Normal heart sounds.  Pulmonary:     Effort: Pulmonary effort is normal.     Breath sounds: Normal breath sounds.  Abdominal:     General: Abdomen is flat.     Tenderness: There is no abdominal tenderness. There is no guarding or rebound.  Musculoskeletal: Normal range of motion.        General: No swelling or tenderness.  Skin:    General: Skin is warm and dry.     Capillary Refill: Capillary refill takes less than 2 seconds.  Neurological:     General: No focal deficit present.     Mental Status: He is alert and oriented to person, place, and time.     Deep Tendon Reflexes: Reflexes normal.  Psychiatric:        Mood and Affect: Mood normal.        Behavior: Behavior normal.      ED Treatments / Results  Labs (all labs ordered are listed, but only abnormal results are displayed) Results for orders placed or performed during the hospital encounter of 04/04/19  CBC with Differential  Result Value Ref Range   WBC 8.5 4.0 - 10.5 K/uL   RBC 4.73 4.22 - 5.81 MIL/uL   Hemoglobin 14.5 13.0 - 17.0 g/dL   HCT 44.1 39.0 - 52.0 %   MCV 93.2 80.0 - 100.0 fL   MCH 30.7 26.0 - 34.0 pg   MCHC 32.9 30.0 - 36.0 g/dL   RDW 13.9 11.5 - 15.5 %   Platelets 252 150 -  400 K/uL   nRBC 0.0 0.0 - 0.2 %   Neutrophils Relative % 42 %   Neutro Abs 3.6 1.7 - 7.7 K/uL   Lymphocytes Relative 36 %   Lymphs Abs 3.1 0.7 - 4.0 K/uL   Monocytes Relative 8 %   Monocytes Absolute 0.7 0.1 -  1.0 K/uL   Eosinophils Relative 13 %   Eosinophils Absolute 1.1 (H) 0.0 - 0.5 K/uL   Basophils Relative 1 %   Basophils Absolute 0.1 0.0 - 0.1 K/uL   Immature Granulocytes 0 %   Abs Immature Granulocytes 0.01 0.00 - 0.07 K/uL  Comprehensive metabolic panel  Result Value Ref Range   Sodium 138 135 - 145 mmol/L   Potassium 3.9 3.5 - 5.1 mmol/L   Chloride 102 98 - 111 mmol/L   CO2 24 22 - 32 mmol/L   Glucose, Bld 108 (H) 70 - 99 mg/dL   BUN 12 8 - 23 mg/dL   Creatinine, Ser 1.04 0.61 - 1.24 mg/dL   Calcium 9.5 8.9 - 10.3 mg/dL   Total Protein 7.6 6.5 - 8.1 g/dL   Albumin 3.8 3.5 - 5.0 g/dL   AST 16 15 - 41 U/L   ALT 16 0 - 44 U/L   Alkaline Phosphatase 50 38 - 126 U/L   Total Bilirubin 0.8 0.3 - 1.2 mg/dL   GFR calc non Af Amer >60 >60 mL/min   GFR calc Af Amer >60 >60 mL/min   Anion gap 12 5 - 15  Brain natriuretic peptide  Result Value Ref Range   B Natriuretic Peptide 94.4 0.0 - 100.0 pg/mL   Dg Chest 2 View  Result Date: 04/04/2019 CLINICAL DATA:  Shortness of breath EXAM: CHEST - 2 VIEW COMPARISON:  January 27, 2019 FINDINGS: Heart size is stable. Chronic bronchitic changes are noted bilaterally. There is no displaced fracture. No dislocation. There is no pneumothorax. No large pleural effusion. Lungs are somewhat hyperexpanded which can be seen in patients with COPD. IMPRESSION: No active cardiopulmonary disease. Electronically Signed   By: Constance Holster M.D.   On: 04/04/2019 18:21    EKG EKG Interpretation  Date/Time:  Thursday April 04 2019 17:48:45 EDT Ventricular Rate:  56 PR Interval:  160 QRS Duration: 136 QT Interval:  446 QTC Calculation: 430 R Axis:   -57 Text Interpretation:  Sinus bradycardia Right bundle branch block Left anterior fascicular  block Confirmed by Randal Buba, Lajoya Dombek ([id number]) on 04/05/2019 12:29:19 AM   Radiology Dg Chest 2 View  Result Date: 04/04/2019 CLINICAL DATA:  Shortness of breath EXAM: CHEST - 2 VIEW COMPARISON:  January 27, 2019 FINDINGS: Heart size is stable. Chronic bronchitic changes are noted bilaterally. There is no displaced fracture. No dislocation. There is no pneumothorax. No large pleural effusion. Lungs are somewhat hyperexpanded which can be seen in patients with COPD. IMPRESSION: No active cardiopulmonary disease. Electronically Signed   By: Constance Holster M.D.   On: 04/04/2019 18:21    Procedures Procedures (including critical care time)  Medications Ordered in ED Medications  pantoprazole (PROTONIX) EC tablet 40 mg (40 mg Oral Given 04/05/19 0056)  alum & mag hydroxide-simeth (MAALOX/MYLANTA) 200-200-20 MG/5ML suspension 30 mL (30 mLs Oral Given 04/05/19 0056)     Symptoms consistent with GERD.  Ruled out for MI in the ED given the time course.  No signs of chf or PE.  I will start medication for GERD and have patient sleep upright as well as starting a gerd friendly diet and referral to GI.    Final Clinical Impressions(s) / ED Diagnoses   Return for intractable cough, coughing up blood,fevers >100.4 unrelieved by medication, shortness of breath, intractable vomiting, chest pain, shortness of breath, weakness,numbness, changes in speech, facial asymmetry,abdominal pain, passing out,Inability to tolerate liquids or food, cough, altered mental status or any concerns. No  signs of systemic illness or infection. The patient is nontoxic-appearing on exam and vital signs are within normal limits.   I have reviewed the triage vital signs and the nursing notes. Pertinent labs &imaging results that were available during my care of the patient were reviewed by me and considered in my medical decision making (see chart for details).  After history, exam, and medical workup I feel the patient has  been appropriately medically screened and is safe for discharge home. Pertinent diagnoses were discussed with the patient. Patient was given return precautions   Kellen Dutch, MD 04/05/19 [id number]

## 2019-04-05 NOTE — ED Notes (Signed)
Pt ambulatory, stand by assist. SPO2 maintained at 96%.

## 2019-05-06 ENCOUNTER — Ambulatory Visit (INDEPENDENT_AMBULATORY_CARE_PROVIDER_SITE_OTHER): Payer: Medicare HMO | Admitting: Family Medicine

## 2019-05-06 ENCOUNTER — Encounter: Payer: Self-pay | Admitting: Family Medicine

## 2019-05-06 ENCOUNTER — Other Ambulatory Visit: Payer: Self-pay

## 2019-05-06 VITALS — BP 156/66 | HR 60 | Temp 98.4°F | Resp 14 | Wt 187.2 lb

## 2019-05-06 DIAGNOSIS — F17200 Nicotine dependence, unspecified, uncomplicated: Secondary | ICD-10-CM

## 2019-05-06 DIAGNOSIS — E119 Type 2 diabetes mellitus without complications: Secondary | ICD-10-CM | POA: Diagnosis not present

## 2019-05-06 DIAGNOSIS — Z23 Encounter for immunization: Secondary | ICD-10-CM

## 2019-05-06 DIAGNOSIS — K219 Gastro-esophageal reflux disease without esophagitis: Secondary | ICD-10-CM

## 2019-05-06 DIAGNOSIS — I10 Essential (primary) hypertension: Secondary | ICD-10-CM | POA: Diagnosis not present

## 2019-05-06 MED ORDER — LOSARTAN POTASSIUM-HCTZ 100-12.5 MG PO TABS: 0.5000 | ORAL_TABLET | Freq: Every day | ORAL | 1 refills | Status: DC

## 2019-05-06 MED ORDER — PANTOPRAZOLE SODIUM 40 MG PO TBEC: 40.0000 mg | DELAYED_RELEASE_TABLET | Freq: Every day | ORAL | 0 refills | Status: DC

## 2019-05-06 NOTE — Progress Notes (Signed)
Subjective:    Patient ID: Phillip Barrette., male    DOB: 04-01-1941, 78 y.o.   MRN: [id number]  HPI Crit Schor. is a 78 y.o. male Presents today for: Chief Complaint  Patient presents with  . Hypertension    6 month f/u  . Medication Refill    need a refill on pantoprazole   Hypertension: BP Readings from Last 3 Encounters:  05/06/19 (!) 162/74  04/05/19 (!) 142/74  01/27/19 (!) 153/80   Lab Results  Component Value Date   CREATININE 1.04 04/04/2019  Has been treated with losartan HCT 100/12.5 mg 1/2 tablet daily. Stable in March, elevated recently as above.  Home BP: 142-146/80's.   August 20 telemedicine visit.  Cough/congestion/shortness of breath and paroxysmal nocturnal dyspnea.  ER evaluation recommended.  Normal brain natruretic peptide.  Chest x-ray without active cardiopulmonary disease.  Normal troponin.  Symptoms likely due to GERD.  Started on Protonix 40 mg daily. Ate too late in past  - changed way he eats now. No more chest symptoms.   Diabetes: Diet controlled.  Wt Readings from Last 3 Encounters:  05/06/19 187 lb 3.2 oz (84.9 kg)  04/04/19 178 lb (80.7 kg)  01/27/19 170 lb (77.1 kg)  has cut back on soda and sweet tea - still some.  Fast food: daily.  Exercise - yardwork. Building a deck.  Microalbumin: Ordered today Optho, foot exam, pneumovax: Up-to-date Diet-controlled.  Stable reading in March. Diabetic Foot Exam - Simple   Simple Foot Form Diabetic Foot exam was performed with the following findings: Yes 05/06/2019  9:01 AM  Visual Inspection Sensation Testing Pulse Check Comments     Lab Results  Component Value Date   HGBA1C 6.4 (H) 11/01/2018   HGBA1C 6.5 (H) 02/22/2018   HGBA1C 7.0 (H) 01/20/2017   Lab Results  Component Value Date   MICROALBUR 11.5 09/23/2015   LDLCALC 104 (H) 11/01/2018   CREATININE 1.04 04/04/2019  Not currently on statin.  Tobacco abuse: Chantix prescribed in March. Cost prohibitive.  Still  smoking. Plans to call [phone].  1/2 ppd.    Patient Active Problem List   Diagnosis Date Noted  . Skin lesion 10/12/2018  . 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 Hypertension 02/28/2013  . Type 2 diabetes mellitus ([place]) 02/28/2013  . Chronic hepatitis C ([place]) 02/14/2011  . TOBACCO ABUSE 12/10/2007  . 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 Asthma    "when I was a boy" (04/08/2013)  . Bronchitis   . Colon polyps   . Colovesical fistula   . Diverticulosis   . GERD (gastroesophageal reflux disease)   . Hepatitis C    treated with injections  . High cholesterol   . Hypertension   . Type II diabetes mellitus (HCC)    hx of . No longer on medicine   Past Surgical History:  Procedure Laterality Date  . CYSTOSCOPY N/A 09/08/2016   Procedure: FIREFLY INJECTIONS;  Surgeon: Cleon Gustin, MD;  Location: [facility];  Service: Urology;  Laterality: N/A;  . INCISION AND DRAINAGE OF WOUND Right 1970's   "leg" (04/08/2013)  . LIVER BIOPSY  ~ 2011  . PROCTOSCOPY N/A 09/08/2016   Procedure: RIGID PROCTOSCOPY;  Surgeon: Michael Boston, MD;  Location: [facility];  Service: General;  Laterality: N/A;  . TONSILLECTOMY     Allergies  Allergen Reactions  . Penicillins Anaphylaxis and Other (See Comments)  Has patient had a PCN reaction causing immediate rash, facial/tongue/throat swelling, SOB or lightheadedness with hypotension: Yes Has patient had a PCN reaction causing severe rash involving mucus membranes or skin necrosis: No Has patient had a PCN reaction that required hospitalization No Has patient had a PCN reaction occurring within the last 10 years: No If all of the above answers are "NO", then may proceed with Cephalosporin use.  . Shellfish Allergy Anaphylaxis   Prior to Admission medications   Medication Sig Start Date End Date Taking? Authorizing Provider  finasteride (PROSCAR) 5 MG tablet Take 5 mg by  mouth daily. 09/22/17  Yes [provider]  pantoprazole (PROTONIX) 40 MG tablet Take 1 tablet (40 mg total) by mouth daily. 04/05/19  Yes Palumbo, April, MD  tamsulosin (FLOMAX) 0.4 MG CAPS capsule Take 0.4 mg by mouth at bedtime.    Yes [provider]  losartan-hydrochlorothiazide (HYZAAR) 100-12.5 MG tablet Take 0.5 tablets by mouth daily. Disregard rx for 1 tab QD. Should be 1/2 tab qd. 11/01/18 04/05/19  Wendie Agreste, MD   Social History   Socioeconomic History  . Marital status: Married    Spouse name: Not on file  . Number of children: 2  . Years of education: Not on file  . Highest education level: Not on file  Occupational History  . Occupation: truck Education administrator: [employer]    Comment: retired  Scientific laboratory technician  . Financial resource strain: Not on file  . Food insecurity    Worry: Not on file    Inability: Not on file  . Transportation needs    Medical: Not on file    Non-medical: Not on file  Tobacco Use  . Smoking status: Current Every Day Smoker    Packs/day: 0.25    Years: 45.00    Pack years: 11.25    Types: Cigarettes  . Smokeless tobacco: Never Used  Substance and Sexual Activity  . Alcohol use: Yes    Comment: 04/08/2013 "haven't drank nothing in > 3 yr; before then I'd have at least 1 pint/day"  . Drug use: No  . Sexual activity: Yes  Lifestyle  . Physical activity    Days per week: Not on file    Minutes per session: Not on file  . Stress: Not on file  Relationships  . Social Herbalist on phone: Not on file    Gets together: Not on file    Attends religious service: Not on file    Active member of club or organization: Not on file    Attends meetings of clubs or organizations: Not on file    Relationship status: Not on file  . Intimate partner violence    Fear of current or ex partner: Not on file    Emotionally abused: Not on file    Physically abused: Not on file    Forced sexual activity: Not on file   Other Topics Concern  . Not on file  Social History Narrative   Pt lives in 1 story home with his wife   Has 2 adult children   Highest level of education: GED & Trade school   Retired Media planner.     Review of Systems  Constitutional: Negative for fatigue and unexpected weight change.  Eyes: Negative for visual disturbance.  Respiratory: Negative for cough, chest tightness and shortness of breath.   Cardiovascular: Negative for chest pain, palpitations and leg swelling.  Gastrointestinal:  Negative for abdominal pain and blood in stool.  Neurological: Negative for dizziness, light-headedness and headaches.       Objective:   Physical Exam Vitals signs reviewed.  Constitutional:      Appearance: He is well-developed.  HENT:     Head: Normocephalic and atraumatic.  Eyes:     Pupils: Pupils are equal, round, and reactive to light.  Neck:     Vascular: No carotid bruit or JVD.  Cardiovascular:     Rate and Rhythm: Normal rate and regular rhythm.     Heart sounds: Normal heart sounds. No murmur.  Pulmonary:     Effort: Pulmonary effort is normal.     Breath sounds: Normal breath sounds. No rales.  Skin:    General: Skin is warm and dry.  Neurological:     Mental Status: He is alert and oriented to person, place, and time.    Vitals:   05/06/19 0855 05/06/19 0914  BP: (!) 162/74 (!) 156/66  Pulse: 60   Resp: 14   Temp: 98.4 F (36.9 C)   TempSrc: Oral   SpO2: 100%   Weight: 187 lb 3.2 oz (84.9 kg)        Assessment & Plan:    Phillip Walterscheid. is a 78 y.o. male Essential hypertension - Plan: HM Diabetes Foot Exam, losartan-hydrochlorothiazide (HYZAAR) 100-12.5 MG tablet, Comprehensive metabolic panel, Lipid Panel  - elevated in office, closer to goal at home.   - decreased sodium, diet changes planned. Labs as above, continue 1/2 tab Hyzaar as above for now with recheck in 6 weeks.   Need for prophylactic vaccination and inoculation against  influenza - Plan: Flu Vaccine QUAD High Dose(Fluad)  Type 2 diabetes mellitus without complication, without long-term current use of insulin (HCC) - Plan: Microalbumin/Creatinine Ratio, Urine, Hemoglobin A1c  - possible statin need. Labs ordered. Diet changes discussed.   TOBACCO ABUSE  - cessation discussed. Plans on 1-800 quitnow resource.   Gastroesophageal reflux disease, esophagitis presence not specified - Plan: pantoprazole (PROTONIX) 40 MG tablet  - stable with diet changes and protonix - refilled.   Meds ordered this encounter  Medications  . pantoprazole (PROTONIX) 40 MG tablet    Sig: Take 1 tablet (40 mg total) by mouth daily.    Dispense:  90 tablet    Refill:  0  . losartan-hydrochlorothiazide (HYZAAR) 100-12.5 MG tablet    Sig: Take 0.5 tablets by mouth daily. Disregard rx for 1 tab QD. Should be 1/2 tab qd.    Dispense:  90 tablet    Refill:  1   Patient Instructions    I will check cholesterol, but would consider statin medicine with history of diabetes.  Can discuss further with lab results.   Continue to cut back on soda, sweets, and fast food. Cut back on salt intake for blood pressure.  recheck the blood pressure in 6 weeks.   See info on diet for heartburn and steps to quit smoking.   Managing Your Hypertension Hypertension is commonly called high blood pressure. This is when the force of your blood pressing against the walls of your arteries is too strong. Arteries are blood vessels that carry blood from your heart throughout your body. Hypertension forces the heart to work harder to pump blood, and may cause the arteries to become narrow or stiff. Having untreated or uncontrolled hypertension can cause heart attack, stroke, kidney disease, and other problems. What are blood pressure readings? A blood pressure reading  consists of a higher number over a lower number. Ideally, your blood pressure should be below 120/80. The first ("top") number is called the  systolic pressure. It is a measure of the pressure in your arteries as your heart beats. The second ("bottom") number is called the diastolic pressure. It is a measure of the pressure in your arteries as the heart relaxes. What does my blood pressure reading mean? Blood pressure is classified into four stages. Based on your blood pressure reading, your health care provider may use the following stages to determine what type of treatment you need, if any. Systolic pressure and diastolic pressure are measured in a unit called mm Hg. Normal  Systolic pressure: below 123456.  Diastolic pressure: below 80. Elevated  Systolic pressure: [id number].  Diastolic pressure: below 80. Hypertension stage 1  Systolic pressure: 0000000.  Diastolic pressure: [id number]. Hypertension stage 2  Systolic pressure: [id number] or above.  Diastolic pressure: 90 or above. What health risks are associated with hypertension? Managing your hypertension is an important responsibility. Uncontrolled hypertension can lead to:  A heart attack.  A stroke.  A weakened blood vessel (aneurysm).  Heart failure.  Kidney damage.  Eye damage.  Metabolic syndrome.  Memory and concentration problems. What changes can I make to manage my hypertension? Hypertension can be managed by making lifestyle changes and possibly by taking medicines. Your health care provider will help you make a plan to bring your blood pressure within a normal range. Eating and drinking   Eat a diet that is high in fiber and potassium, and low in salt (sodium), added sugar, and fat. An example eating plan is called the DASH (Dietary Approaches to Stop Hypertension) diet. To eat this way: ? Eat plenty of fresh fruits and vegetables. Try to fill half of your plate at each meal with fruits and vegetables. ? Eat whole grains, such as whole wheat pasta, brown rice, or whole grain bread. Fill about one quarter of your plate with whole grains. ? Eat low-fat  diary products. ? Avoid fatty cuts of meat, processed or cured meats, and poultry with skin. Fill about one quarter of your plate with lean proteins such as fish, chicken without skin, beans, eggs, and tofu. ? Avoid premade and processed foods. These tend to be higher in sodium, added sugar, and fat.  Reduce your daily sodium intake. Most people with hypertension should eat less than 1,500 mg of sodium a day.  Limit alcohol intake to no more than 1 drink a day for nonpregnant women and 2 drinks a day for men. One drink equals 12 oz of beer, 5 oz of wine, or 1 oz of hard liquor. Lifestyle  Work with your health care provider to maintain a healthy body weight, or to lose weight. Ask what an ideal weight is for you.  Get at least 30 minutes of exercise that causes your heart to beat faster (aerobic exercise) most days of the week. Activities may include walking, swimming, or biking.  Include exercise to strengthen your muscles (resistance exercise), such as weight lifting, as part of your weekly exercise routine. Try to do these types of exercises for 30 minutes at least 3 days a week.  Do not use any products that contain nicotine or tobacco, such as cigarettes and e-cigarettes. If you need help quitting, ask your health care provider.  Control any long-term (chronic) conditions you have, such as high cholesterol or diabetes. Monitoring  Monitor your blood pressure at home as told by  your health care provider. Your personal target blood pressure may vary depending on your medical conditions, your age, and other factors.  Have your blood pressure checked regularly, as often as told by your health care provider. Working with your health care provider  Review all the medicines you take with your health care provider because there may be side effects or interactions.  Talk with your health care provider about your diet, exercise habits, and other lifestyle factors that may be contributing to  hypertension.  Visit your health care provider regularly. Your health care provider can help you create and adjust your plan for managing hypertension. Will I need medicine to control my blood pressure? Your health care provider may prescribe medicine if lifestyle changes are not enough to get your blood pressure under control, and if:  Your systolic blood pressure is 130 or higher.  Your diastolic blood pressure is 80 or higher. Take medicines only as told by your health care provider. Follow the directions carefully. Blood pressure medicines must be taken as prescribed. The medicine does not work as well when you skip doses. Skipping doses also puts you at risk for problems. Contact a health care provider if:  You think you are having a reaction to medicines you have taken.  You have repeated (recurrent) headaches.  You feel dizzy.  You have swelling in your ankles.  You have trouble with your vision. Get help right away if:  You develop a severe headache or confusion.  You have unusual weakness or numbness, or you feel faint.  You have severe pain in your chest or abdomen.  You vomit repeatedly.  You have trouble breathing. Summary  Hypertension is when the force of blood pumping through your arteries is too strong. If this condition is not controlled, it may put you at risk for serious complications.  Your personal target blood pressure may vary depending on your medical conditions, your age, and other factors. For most people, a normal blood pressure is less than 120/80.  Hypertension is managed by lifestyle changes, medicines, or both. Lifestyle changes include weight loss, eating a healthy, low-sodium diet, exercising more, and limiting alcohol. This information is not intended to replace advice given to you by your health care provider. Make sure you discuss any questions you have with your health care provider. Document Released: 04/25/2012 Document Revised:  11/23/2018 Document Reviewed: 06/29/2016 Elsevier Patient Education  2020 [employer].    Steps to Quit Smoking Smoking tobacco is the leading cause of preventable death. It can affect almost every organ in the body. Smoking puts you and those around you at risk for developing many serious chronic diseases. Quitting smoking can be difficult, but it is one of the best things that you can do for your health. It is never too late to quit. How do I get ready to quit? When you decide to quit smoking, create a plan to help you succeed. Before you quit:  Pick a date to quit. Set a date within the next 2 weeks to give you time to prepare.  Write down the reasons why you are quitting. Keep this list in places where you will see it often.  Tell your family, friends, and co-workers that you are quitting. Support from your loved ones can make quitting easier.  Talk with your health care provider about your options for quitting smoking.  Find out what treatment options are covered by your health insurance.  Identify people, places, things, and activities that make  you want to smoke (triggers). Avoid them. What first steps can I take to quit smoking?  Throw away all cigarettes at home, at work, and in your car.  Throw away smoking accessories, such as Scientist, research (medical).  Clean your car. Make sure to empty the ashtray.  Clean your home, including curtains and carpets. What strategies can I use to quit smoking? Talk with your health care provider about combining strategies, such as taking medicines while you are also receiving in-person counseling. Using these two strategies together makes you more likely to succeed in quitting than if you used either strategy on its own.  If you are pregnant or breastfeeding, talk with your health care provider about finding counseling or other support strategies to quit smoking. Do not take medicine to help you quit smoking unless your health care provider  tells you to do so. To quit smoking: Quit right away  Quit smoking completely, instead of gradually reducing how much you smoke over a period of time. Research shows that stopping smoking right away is more successful than gradually quitting.  Attend in-person counseling to help you build problem-solving skills. You are more likely to succeed in quitting if you attend counseling sessions regularly. Even short sessions of 10 minutes can be effective. Take medicine You may take medicines to help you quit smoking. Some medicines require a prescription and some you can purchase over-the-counter. Medicines may have nicotine in them to replace the nicotine in cigarettes. Medicines may:  Help to stop cravings.  Help to relieve withdrawal symptoms. Your health care provider may recommend:  Nicotine patches, gum, or lozenges.  Nicotine inhalers or sprays.  Non-nicotine medicine that is taken by mouth. Find resources Find resources and support systems that can help you to quit smoking and remain smoke-free after you quit. These resources are most helpful when you use them often. They include:  Online chats with a Social worker.  Telephone quitlines.  Printed Furniture conservator/restorer.  Support groups or group counseling.  Text messaging programs.  Mobile phone apps or applications. Use apps that can help you stick to your quit plan by providing reminders, tips, and encouragement. There are many free apps for mobile devices as well as websites. Examples include Quit Guide from the [employer] and smokefree.gov What things can I do to make it easier to quit?   Reach out to your family and friends for support and encouragement. Call telephone quitlines (1-800-QUIT-NOW), reach out to support groups, or work with a counselor for support.  Ask people who smoke to avoid smoking around you.  Avoid places that trigger you to smoke, such as bars, parties, or smoke-break areas at work.  Spend time with people  who do not smoke.  Lessen the stress in your life. Stress can be a smoking trigger for some people. To lessen stress, try: ? Exercising regularly. ? Doing deep-breathing exercises. ? Doing yoga. ? Meditating. ? Performing a body scan. This involves closing your eyes, scanning your body from head to toe, and noticing which parts of your body are particularly tense. Try to relax the muscles in those areas. How will I feel when I quit smoking? Day 1 to 3 weeks Within the first 24 hours of quitting smoking, you may start to feel withdrawal symptoms. These symptoms are usually most noticeable 2-3 days after quitting, but they usually do not last for more than 2-3 weeks. You may experience these symptoms:  Mood swings.  Restlessness, anxiety, or irritability.  Trouble concentrating.  Dizziness.  Strong cravings for sugary foods and nicotine.  Mild weight gain.  Constipation.  Nausea.  Coughing or a sore throat.  Changes in how the medicines that you take for unrelated issues work in your body.  Depression.  Trouble sleeping (insomnia). Week 3 and afterward After the first 2-3 weeks of quitting, you may start to notice more positive results, such as:  Improved sense of smell and taste.  Decreased coughing and sore throat.  Slower heart rate.  Lower blood pressure.  Clearer skin.  The ability to breathe more easily.  Fewer sick days. Quitting smoking can be very challenging. Do not get discouraged if you are not successful the first time. Some people need to make many attempts to quit before they achieve long-term success. Do your best to stick to your quit plan, and talk with your health care provider if you have any questions or concerns. Summary  Smoking tobacco is the leading cause of preventable death. Quitting smoking is one of the best things that you can do for your health.  When you decide to quit smoking, create a plan to help you succeed.  Quit smoking  right away, not slowly over a period of time.  When you start quitting, seek help from your health care provider, family, or friends. This information is not intended to replace advice given to you by your health care provider. Make sure you discuss any questions you have with your health care provider. Document Released: 07/26/2001 Document Revised: 10/19/2018 Document Reviewed: 10/20/2018 Elsevier Patient Education  2020 [place] for Gastroesophageal Reflux Disease, Adult When you have gastroesophageal reflux disease (GERD), the foods you eat and your eating habits are very important. Choosing the right foods can help ease your discomfort. Think about working with a nutrition specialist (dietitian) to help you make good choices. What are tips for following this plan?  Meals  Choose healthy foods that are low in fat, such as fruits, vegetables, whole grains, low-fat dairy products, and lean meat, fish, and poultry.  Eat small meals often instead of 3 large meals a day. Eat your meals slowly, and in a place where you are relaxed. Avoid bending over or lying down until 2-3 hours after eating.  Avoid eating meals 2-3 hours before bed.  Avoid drinking a lot of liquid with meals.  Cook foods using methods other than frying. Bake, grill, or broil food instead.  Avoid or limit: ? Chocolate. ? Peppermint or spearmint. ? Alcohol. ? Pepper. ? Black and decaffeinated coffee. ? Black and decaffeinated tea. ? Bubbly (carbonated) soft drinks. ? Caffeinated energy drinks and soft drinks.  Limit high-fat foods such as: ? Fatty meat or fried foods. ? Whole milk, cream, butter, or ice cream. ? Nuts and nut butters. ? Pastries, donuts, and sweets made with butter or shortening.  Avoid foods that cause symptoms. These foods may be different for everyone. Common foods that cause symptoms include: ? Tomatoes. ? Oranges, lemons, and limes. ? Peppers. ? Spicy food. ? Onions  and garlic. ? Vinegar. Lifestyle  Maintain a healthy weight. Ask your doctor what weight is healthy for you. If you need to lose weight, work with your doctor to do so safely.  Exercise for at least 30 minutes for 5 or more days each week, or as told by your doctor.  Wear loose-fitting clothes.  Do not smoke. If you need help quitting, ask your doctor.  Sleep with the head of your bed higher  than your feet. Use a wedge under the mattress or blocks under the bed frame to raise the head of the bed. Summary  When you have gastroesophageal reflux disease (GERD), food and lifestyle choices are very important in easing your symptoms.  Eat small meals often instead of 3 large meals a day. Eat your meals slowly, and in a place where you are relaxed.  Limit high-fat foods such as fatty meat or fried foods.  Avoid bending over or lying down until 2-3 hours after eating.  Avoid peppermint and spearmint, caffeine, alcohol, and chocolate. This information is not intended to replace advice given to you by your health care provider. Make sure you discuss any questions you have with your health care provider. Document Released: 01/31/2012 Document Revised: 11/22/2018 Document Reviewed: 09/06/2016 Elsevier Patient Education  [employer].    If you have lab work done today you will be contacted with your lab results within the next 2 weeks.  If you have not heard from [place] then please contact us. The fastest way to get your results is to register for My Chart.   IF you received an x-ray today, you will receive an invoice from [facility]. Please contact [facility] at [phone] with questions or concerns regarding your invoice.   IF you received labwork today, you will receive an invoice from [place]. Please contact [facility] at [phone] with questions or concerns regarding your invoice.   Our billing staff will not be able to assist you with questions regarding bills  from these companies.  You will be contacted with the lab results as soon as they are available. The fastest way to get your results is to activate your My Chart account. Instructions are located on the last page of this paperwork. If you have not heard from [place] regarding the results in 2 weeks, please contact this office.       .Signed,   [name], MD [facility].  05/06/19 9:21 AM

## 2019-05-06 NOTE — Patient Instructions (Addendum)
I will check cholesterol, but would consider statin medicine with history of diabetes.  Can discuss further with lab results.   Continue to cut back on soda, sweets, and fast food. Cut back on salt intake for blood pressure.  recheck the blood pressure in 6 weeks.   See info on diet for heartburn and steps to quit smoking.   Managing Your Hypertension Hypertension is commonly called high blood pressure. This is when the force of your blood pressing against the walls of your arteries is too strong. Arteries are blood vessels that carry blood from your heart throughout your body. Hypertension forces the heart to work harder to pump blood, and may cause the arteries to become narrow or stiff. Having untreated or uncontrolled hypertension can cause heart attack, stroke, kidney disease, and other problems. What are blood pressure readings? A blood pressure reading consists of a higher number over a lower number. Ideally, your blood pressure should be below 120/80. The first ("top") number is called the systolic pressure. It is a measure of the pressure in your arteries as your heart beats. The second ("bottom") number is called the diastolic pressure. It is a measure of the pressure in your arteries as the heart relaxes. What does my blood pressure reading mean? Blood pressure is classified into four stages. Based on your blood pressure reading, your health care provider may use the following stages to determine what type of treatment you need, if any. Systolic pressure and diastolic pressure are measured in a unit called mm Hg. Normal  Systolic pressure: below 123456.  Diastolic pressure: below 80. Elevated  Systolic pressure: [id number].  Diastolic pressure: below 80. Hypertension stage 1  Systolic pressure: 0000000.  Diastolic pressure: [id number]. Hypertension stage 2  Systolic pressure: [id number] or above.  Diastolic pressure: 90 or above. What health risks are associated with  hypertension? Managing your hypertension is an important responsibility. Uncontrolled hypertension can lead to:  A heart attack.  A stroke.  A weakened blood vessel (aneurysm).  Heart failure.  Kidney damage.  Eye damage.  Metabolic syndrome.  Memory and concentration problems. What changes can I make to manage my hypertension? Hypertension can be managed by making lifestyle changes and possibly by taking medicines. Your health care provider will help you make a plan to bring your blood pressure within a normal range. Eating and drinking   Eat a diet that is high in fiber and potassium, and low in salt (sodium), added sugar, and fat. An example eating plan is called the DASH (Dietary Approaches to Stop Hypertension) diet. To eat this way: ? Eat plenty of fresh fruits and vegetables. Try to fill half of your plate at each meal with fruits and vegetables. ? Eat whole grains, such as whole wheat pasta, brown rice, or whole grain bread. Fill about one quarter of your plate with whole grains. ? Eat low-fat diary products. ? Avoid fatty cuts of meat, processed or cured meats, and poultry with skin. Fill about one quarter of your plate with lean proteins such as fish, chicken without skin, beans, eggs, and tofu. ? Avoid premade and processed foods. These tend to be higher in sodium, added sugar, and fat.  Reduce your daily sodium intake. Most people with hypertension should eat less than 1,500 mg of sodium a day.  Limit alcohol intake to no more than 1 drink a day for nonpregnant women and 2 drinks a day for men. One drink equals 12 oz of beer, 5 oz of wine, or  1 oz of hard liquor. Lifestyle  Work with your health care provider to maintain a healthy body weight, or to lose weight. Ask what an ideal weight is for you.  Get at least 30 minutes of exercise that causes your heart to beat faster (aerobic exercise) most days of the week. Activities may include walking, swimming, or  biking.  Include exercise to strengthen your muscles (resistance exercise), such as weight lifting, as part of your weekly exercise routine. Try to do these types of exercises for 30 minutes at least 3 days a week.  Do not use any products that contain nicotine or tobacco, such as cigarettes and e-cigarettes. If you need help quitting, ask your health care provider.  Control any long-term (chronic) conditions you have, such as high cholesterol or diabetes. Monitoring  Monitor your blood pressure at home as told by your health care provider. Your personal target blood pressure may vary depending on your medical conditions, your age, and other factors.  Have your blood pressure checked regularly, as often as told by your health care provider. Working with your health care provider  Review all the medicines you take with your health care provider because there may be side effects or interactions.  Talk with your health care provider about your diet, exercise habits, and other lifestyle factors that may be contributing to hypertension.  Visit your health care provider regularly. Your health care provider can help you create and adjust your plan for managing hypertension. Will I need medicine to control my blood pressure? Your health care provider may prescribe medicine if lifestyle changes are not enough to get your blood pressure under control, and if:  Your systolic blood pressure is 130 or higher.  Your diastolic blood pressure is 80 or higher. Take medicines only as told by your health care provider. Follow the directions carefully. Blood pressure medicines must be taken as prescribed. The medicine does not work as well when you skip doses. Skipping doses also puts you at risk for problems. Contact a health care provider if:  You think you are having a reaction to medicines you have taken.  You have repeated (recurrent) headaches.  You feel dizzy.  You have swelling in your  ankles.  You have trouble with your vision. Get help right away if:  You develop a severe headache or confusion.  You have unusual weakness or numbness, or you feel faint.  You have severe pain in your chest or abdomen.  You vomit repeatedly.  You have trouble breathing. Summary  Hypertension is when the force of blood pumping through your arteries is too strong. If this condition is not controlled, it may put you at risk for serious complications.  Your personal target blood pressure may vary depending on your medical conditions, your age, and other factors. For most people, a normal blood pressure is less than 120/80.  Hypertension is managed by lifestyle changes, medicines, or both. Lifestyle changes include weight loss, eating a healthy, low-sodium diet, exercising more, and limiting alcohol. This information is not intended to replace advice given to you by your health care provider. Make sure you discuss any questions you have with your health care provider. Document Released: 04/25/2012 Document Revised: 11/23/2018 Document Reviewed: 06/29/2016 Elsevier Patient Education  2020 [employer].    Steps to Quit Smoking Smoking tobacco is the leading cause of preventable death. It can affect almost every organ in the body. Smoking puts you and those around you at risk for developing many  serious chronic diseases. Quitting smoking can be difficult, but it is one of the best things that you can do for your health. It is never too late to quit. How do I get ready to quit? When you decide to quit smoking, create a plan to help you succeed. Before you quit:  Pick a date to quit. Set a date within the next 2 weeks to give you time to prepare.  Write down the reasons why you are quitting. Keep this list in places where you will see it often.  Tell your family, friends, and co-workers that you are quitting. Support from your loved ones can make quitting easier.  Talk with your health  care provider about your options for quitting smoking.  Find out what treatment options are covered by your health insurance.  Identify people, places, things, and activities that make you want to smoke (triggers). Avoid them. What first steps can I take to quit smoking?  Throw away all cigarettes at home, at work, and in your car.  Throw away smoking accessories, such as Scientist, research (medical).  Clean your car. Make sure to empty the ashtray.  Clean your home, including curtains and carpets. What strategies can I use to quit smoking? Talk with your health care provider about combining strategies, such as taking medicines while you are also receiving in-person counseling. Using these two strategies together makes you more likely to succeed in quitting than if you used either strategy on its own.  If you are pregnant or breastfeeding, talk with your health care provider about finding counseling or other support strategies to quit smoking. Do not take medicine to help you quit smoking unless your health care provider tells you to do so. To quit smoking: Quit right away  Quit smoking completely, instead of gradually reducing how much you smoke over a period of time. Research shows that stopping smoking right away is more successful than gradually quitting.  Attend in-person counseling to help you build problem-solving skills. You are more likely to succeed in quitting if you attend counseling sessions regularly. Even short sessions of 10 minutes can be effective. Take medicine You may take medicines to help you quit smoking. Some medicines require a prescription and some you can purchase over-the-counter. Medicines may have nicotine in them to replace the nicotine in cigarettes. Medicines may:  Help to stop cravings.  Help to relieve withdrawal symptoms. Your health care provider may recommend:  Nicotine patches, gum, or lozenges.  Nicotine inhalers or sprays.  Non-nicotine medicine  that is taken by mouth. Find resources Find resources and support systems that can help you to quit smoking and remain smoke-free after you quit. These resources are most helpful when you use them often. They include:  Online chats with a Social worker.  Telephone quitlines.  Printed Furniture conservator/restorer.  Support groups or group counseling.  Text messaging programs.  Mobile phone apps or applications. Use apps that can help you stick to your quit plan by providing reminders, tips, and encouragement. There are many free apps for mobile devices as well as websites. Examples include Quit Guide from the [employer] and smokefree.gov What things can I do to make it easier to quit?   Reach out to your family and friends for support and encouragement. Call telephone quitlines (1-800-QUIT-NOW), reach out to support groups, or work with a counselor for support.  Ask people who smoke to avoid smoking around you.  Avoid places that trigger you to smoke, such as bars, parties,  or smoke-break areas at work.  Spend time with people who do not smoke.  Lessen the stress in your life. Stress can be a smoking trigger for some people. To lessen stress, try: ? Exercising regularly. ? Doing deep-breathing exercises. ? Doing yoga. ? Meditating. ? Performing a body scan. This involves closing your eyes, scanning your body from head to toe, and noticing which parts of your body are particularly tense. Try to relax the muscles in those areas. How will I feel when I quit smoking? Day 1 to 3 weeks Within the first 24 hours of quitting smoking, you may start to feel withdrawal symptoms. These symptoms are usually most noticeable 2-3 days after quitting, but they usually do not last for more than 2-3 weeks. You may experience these symptoms:  Mood swings.  Restlessness, anxiety, or irritability.  Trouble concentrating.  Dizziness.  Strong cravings for sugary foods and nicotine.  Mild weight  gain.  Constipation.  Nausea.  Coughing or a sore throat.  Changes in how the medicines that you take for unrelated issues work in your body.  Depression.  Trouble sleeping (insomnia). Week 3 and afterward After the first 2-3 weeks of quitting, you may start to notice more positive results, such as:  Improved sense of smell and taste.  Decreased coughing and sore throat.  Slower heart rate.  Lower blood pressure.  Clearer skin.  The ability to breathe more easily.  Fewer sick days. Quitting smoking can be very challenging. Do not get discouraged if you are not successful the first time. Some people need to make many attempts to quit before they achieve long-term success. Do your best to stick to your quit plan, and talk with your health care provider if you have any questions or concerns. Summary  Smoking tobacco is the leading cause of preventable death. Quitting smoking is one of the best things that you can do for your health.  When you decide to quit smoking, create a plan to help you succeed.  Quit smoking right away, not slowly over a period of time.  When you start quitting, seek help from your health care provider, family, or friends. This information is not intended to replace advice given to you by your health care provider. Make sure you discuss any questions you have with your health care provider. Document Released: 07/26/2001 Document Revised: 10/19/2018 Document Reviewed: 10/20/2018 Elsevier Patient Education  2020 [place] for Gastroesophageal Reflux Disease, Adult When you have gastroesophageal reflux disease (GERD), the foods you eat and your eating habits are very important. Choosing the right foods can help ease your discomfort. Think about working with a nutrition specialist (dietitian) to help you make good choices. What are tips for following this plan?  Meals  Choose healthy foods that are low in fat, such as fruits,  vegetables, whole grains, low-fat dairy products, and lean meat, fish, and poultry.  Eat small meals often instead of 3 large meals a day. Eat your meals slowly, and in a place where you are relaxed. Avoid bending over or lying down until 2-3 hours after eating.  Avoid eating meals 2-3 hours before bed.  Avoid drinking a lot of liquid with meals.  Cook foods using methods other than frying. Bake, grill, or broil food instead.  Avoid or limit: ? Chocolate. ? Peppermint or spearmint. ? Alcohol. ? Pepper. ? Black and decaffeinated coffee. ? Black and decaffeinated tea. ? Bubbly (carbonated) soft drinks. ? Caffeinated energy drinks and soft  drinks.  Limit high-fat foods such as: ? Fatty meat or fried foods. ? Whole milk, cream, butter, or ice cream. ? Nuts and nut butters. ? Pastries, donuts, and sweets made with butter or shortening.  Avoid foods that cause symptoms. These foods may be different for everyone. Common foods that cause symptoms include: ? Tomatoes. ? Oranges, lemons, and limes. ? Peppers. ? Spicy food. ? Onions and garlic. ? Vinegar. Lifestyle  Maintain a healthy weight. Ask your doctor what weight is healthy for you. If you need to lose weight, work with your doctor to do so safely.  Exercise for at least 30 minutes for 5 or more days each week, or as told by your doctor.  Wear loose-fitting clothes.  Do not smoke. If you need help quitting, ask your doctor.  Sleep with the head of your bed higher than your feet. Use a wedge under the mattress or blocks under the bed frame to raise the head of the bed. Summary  When you have gastroesophageal reflux disease (GERD), food and lifestyle choices are very important in easing your symptoms.  Eat small meals often instead of 3 large meals a day. Eat your meals slowly, and in a place where you are relaxed.  Limit high-fat foods such as fatty meat or fried foods.  Avoid bending over or lying down until 2-3 hours  after eating.  Avoid peppermint and spearmint, caffeine, alcohol, and chocolate. This information is not intended to replace advice given to you by your health care provider. Make sure you discuss any questions you have with your health care provider. Document Released: 01/31/2012 Document Revised: 11/22/2018 Document Reviewed: 09/06/2016 Elsevier Patient Education  [employer].    If you have lab work done today you will be contacted with your lab results within the next 2 weeks.  If you have not heard from [place] then please contact us. The fastest way to get your results is to register for My Chart.   IF you received an x-ray today, you will receive an invoice from [facility]. Please contact [facility] at [phone] with questions or concerns regarding your invoice.   IF you received labwork today, you will receive an invoice from [place]. Please contact [facility] at [phone] with questions or concerns regarding your invoice.   Our billing staff will not be able to assist you with questions regarding bills from these companies.  You will be contacted with the lab results as soon as they are available. The fastest way to get your results is to activate your My Chart account. Instructions are located on the last page of this paperwork. If you have not heard from [place] regarding the results in 2 weeks, please contact this office.

## 2019-05-07 LAB — COMPREHENSIVE METABOLIC PANEL
ALT: 14 IU/L (ref 0–44)
AST: 16 IU/L (ref 0–40)
Albumin/Globulin Ratio: 1.4 (ref 1.2–2.2)
Albumin: 4.3 g/dL (ref 3.7–4.7)
Alkaline Phosphatase: 61 IU/L (ref 39–117)
BUN/Creatinine Ratio: 9 — ABNORMAL LOW (ref 10–24)
BUN: 11 mg/dL (ref 8–27)
Bilirubin Total: 0.3 mg/dL (ref 0.0–1.2)
CO2: 25 mmol/L (ref 20–29)
Calcium: 9.5 mg/dL (ref 8.6–10.2)
Chloride: 97 mmol/L (ref 96–106)
Creatinine, Ser: 1.25 mg/dL (ref 0.76–1.27)
GFR calc Af Amer: 64 mL/min/{1.73_m2} (ref >59)
GFR calc non Af Amer: 56 mL/min/{1.73_m2} — ABNORMAL LOW (ref >59)
Globulin, Total: 3 g/dL (ref 1.5–4.5)
Glucose: 86 mg/dL (ref 65–99)
Potassium: 4.4 mmol/L (ref 3.5–5.2)
Sodium: 137 mmol/L (ref 134–144)
Total Protein: 7.3 g/dL (ref 6.0–8.5)

## 2019-05-07 LAB — LIPID PANEL
Chol/HDL Ratio: 5.9 ratio — ABNORMAL HIGH (ref 0.0–5.0)
Cholesterol, Total: 217 mg/dL — ABNORMAL HIGH (ref 100–199)
HDL: 37 mg/dL — ABNORMAL LOW (ref >39)
LDL Chol Calc (NIH): 117 mg/dL — ABNORMAL HIGH (ref 0–99)
Triglycerides: 360 mg/dL — ABNORMAL HIGH (ref 0–149)
VLDL Cholesterol Cal: 63 mg/dL — ABNORMAL HIGH (ref 5–40)

## 2019-05-07 LAB — MICROALBUMIN / CREATININE URINE RATIO
Creatinine, Urine: 32.5 mg/dL
Microalb/Creat Ratio: 18 mg/g creat (ref 0–29)
Microalbumin, Urine: 5.7 ug/mL

## 2019-05-07 LAB — HEMOGLOBIN A1C
Est. average glucose Bld gHb Est-mCnc: 151 mg/dL
Hgb A1c MFr Bld: 6.9 % — ABNORMAL HIGH (ref 4.8–5.6)

## 2019-05-12 ENCOUNTER — Other Ambulatory Visit: Payer: Self-pay | Admitting: Family Medicine

## 2019-05-12 DIAGNOSIS — E785 Hyperlipidemia, unspecified: Secondary | ICD-10-CM

## 2019-05-12 MED ORDER — ATORVASTATIN CALCIUM 10 MG PO TABS: 10.0000 mg | ORAL_TABLET | Freq: Every day | ORAL | 0 refills | Status: DC

## 2019-06-19 ENCOUNTER — Ambulatory Visit (INDEPENDENT_AMBULATORY_CARE_PROVIDER_SITE_OTHER): Payer: Medicare HMO | Admitting: Family Medicine

## 2019-06-19 ENCOUNTER — Other Ambulatory Visit: Payer: Self-pay

## 2019-06-19 ENCOUNTER — Encounter: Payer: Self-pay | Admitting: Family Medicine

## 2019-06-19 VITALS — BP 164/80 | HR 59 | Temp 97.9°F | Wt 182.6 lb

## 2019-06-19 DIAGNOSIS — E785 Hyperlipidemia, unspecified: Secondary | ICD-10-CM

## 2019-06-19 DIAGNOSIS — E119 Type 2 diabetes mellitus without complications: Secondary | ICD-10-CM | POA: Diagnosis not present

## 2019-06-19 DIAGNOSIS — I10 Essential (primary) hypertension: Secondary | ICD-10-CM | POA: Diagnosis not present

## 2019-06-19 MED ORDER — LOSARTAN POTASSIUM-HCTZ 50-12.5 MG PO TABS: 1.0000 | ORAL_TABLET | Freq: Every day | ORAL | 1 refills | Status: DC

## 2019-06-19 MED ORDER — ATORVASTATIN CALCIUM 10 MG PO TABS: 10.0000 mg | ORAL_TABLET | Freq: Every day | ORAL | 1 refills | Status: DC

## 2019-06-19 NOTE — Patient Instructions (Addendum)
  Change losartan HCTZ to the 50/12.5 mg dose, but take 1 full pill each day.  Lipitor once per day for cholesterol.  Continue to watch diet, exercise, decrease beer intake, and recheck in 6 weeks for diabetes, blood pressure and cholesterol.  If you have lab work done today you will be contacted with your lab results within the next 2 weeks.  If you have not heard from [place] then please contact us. The fastest way to get your results is to register for My Chart.   IF you received an x-ray today, you will receive an invoice from [facility]. Please contact [facility] at [phone] with questions or concerns regarding your invoice.   IF you received labwork today, you will receive an invoice from [place]. Please contact [facility] at [phone] with questions or concerns regarding your invoice.   Our billing staff will not be able to assist you with questions regarding bills from these companies.  You will be contacted with the lab results as soon as they are available. The fastest way to get your results is to activate your My Chart account. Instructions are located on the last page of this paperwork. If you have not heard from [place] regarding the results in 2 weeks, please contact this office.

## 2019-06-19 NOTE — Progress Notes (Signed)
Subjective:  Patient ID: Phillip Barrette., male    DOB: [birth year]  Age: 78 y.o. MRN: [id number]  CC:  Chief Complaint  Patient presents with   Hypertension    Here for his 6 week f/u on Bp. Home Bp was reading 146/82    HPI Phillip Maynard. presents for  Hypertension: BP Readings from Last 3 Encounters:  06/19/19 (!) 164/80  05/06/19 (!) 156/66  04/05/19 (!) 142/74   Lab Results  Component Value Date   CREATININE 1.25 05/06/2019   Elevated in office at last visit September 21.  Close to goal at home.  Decreased sodium, diet changes planned.  Continued one half tab Losartan HCTZ 100/12.5 mg daily. Home readings 139/75, then battery out in machine - not sure it is working.  Not sure if breaking pills even - BP ok on days when larger piece.  Beer 2-3 per night, prior 1 per night. Plans to cut back.   Hyperlipidemia:  Lab Results  Component Value Date   CHOL 217 (H) 05/06/2019   HDL 37 (L) 05/06/2019   LDLCALC 117 (H) 05/06/2019   TRIG 360 (H) 05/06/2019   CHOLHDL 5.9 (H) 05/06/2019   Lab Results  Component Value Date   ALT 14 05/06/2019   AST 16 05/06/2019   ALKPHOS 61 05/06/2019   BILITOT 0.3 05/06/2019  The 10-year ASCVD risk score Mikey Bussing DC Jr., et al., 2013) is: 78.5%   Values used to calculate the score:     Age: 56 years     Sex: Male     Is Non-Hispanic African American: Yes     Diabetic: Yes     Tobacco smoker: Yes     Systolic Blood Pressure: 123456 mmHg     Is BP treated: Yes     HDL Cholesterol: 37 mg/dL     Total Cholesterol: 217 mg/dL  Elevated on September 21st., recommended statin. rx sent, but then cancelled.  No prior statin. Has been trying to exercise.  Wt Readings from Last 3 Encounters:  06/19/19 182 lb 9.6 oz (82.8 kg)  05/06/19 187 lb 3.2 oz (84.9 kg)  04/04/19 178 lb (80.7 kg)   Diabetes:  Aic in diabetes range in September - planned wt loss. Down since last visit. More exercise in winter. No ne meds.    Lab Results    Component Value Date   HGBA1C 6.9 (H) 05/06/2019   HGBA1C 6.4 (H) 11/01/2018   HGBA1C 6.5 (H) 02/22/2018   Lab Results  Component Value Date   MICROALBUR 11.5 09/23/2015   LDLCALC 117 (H) 05/06/2019   CREATININE 1.25 05/06/2019      History Patient Active Problem List   Diagnosis Date Noted   Skin lesion 10/12/2018   Colovesical fistula s/p robotic colectomy/repair 09/08/2016 07/12/2016   Hypertension 02/28/2013   Type 2 diabetes mellitus ([place]) 02/28/2013   Chronic hepatitis C ([place]) 02/14/2011   TOBACCO ABUSE 12/10/2007   MERALGIA PARESTHETICA 12/10/2007   Past Medical History:  Diagnosis Date   Asthma    "when I was a boy" (04/08/2013)   Bronchitis    Colon polyps    Colovesical fistula    Diverticulosis    GERD (gastroesophageal reflux disease)    Hepatitis C    treated with injections   High cholesterol    Hypertension    Type II diabetes mellitus (HCC)    hx of . No longer on medicine   Past Surgical History:  Procedure Laterality Date  CYSTOSCOPY N/A 09/08/2016   Procedure: FIREFLY INJECTIONS;  Surgeon: Cleon Gustin, MD;  Location: [facility];  Service: Urology;  Laterality: N/A;   INCISION AND DRAINAGE OF WOUND Right 1970's   "leg" (04/08/2013)   LIVER BIOPSY  ~ 2011   PROCTOSCOPY N/A 09/08/2016   Procedure: RIGID PROCTOSCOPY;  Surgeon: Michael Boston, MD;  Location: [facility];  Service: General;  Laterality: N/A;   TONSILLECTOMY     Allergies  Allergen Reactions   Penicillins Anaphylaxis and Other (See Comments)    Has patient had a PCN reaction causing immediate rash, facial/tongue/throat swelling, SOB or lightheadedness with hypotension: Yes Has patient had a PCN reaction causing severe rash involving mucus membranes or skin necrosis: No Has patient had a PCN reaction that required hospitalization No Has patient had a PCN reaction occurring within the last 10 years: No If all of the above answers are "NO", then may proceed with  Cephalosporin use.   Shellfish Allergy Anaphylaxis   Prior to Admission medications   Medication Sig Start Date End Date Taking? Authorizing Provider  finasteride (PROSCAR) 5 MG tablet Take 5 mg by mouth daily. 09/22/17   [provider]  losartan-hydrochlorothiazide (HYZAAR) 100-12.5 MG tablet Take 0.5 tablets by mouth daily. Disregard rx for 1 tab QD. Should be 1/2 tab qd. 05/06/19 08/04/19  Wendie Agreste, MD  tamsulosin (FLOMAX) 0.4 MG CAPS capsule Take 0.4 mg by mouth at bedtime.     [provider]   Social History   Socioeconomic History   Marital status: Married    Spouse name: Not on file   Number of children: 2   Years of education: Not on file   Highest education level: Not on file  Occupational History   Occupation: truck Education administrator: [employer]    Comment: retired  Scientist, product/process development strain: Not on file   Food insecurity    Worry: Not on file    Inability: Not on Lexicographer needs    Medical: Not on file    Non-medical: Not on file  Tobacco Use   Smoking status: Current Every Day Smoker    Packs/day: 0.25    Years: 45.00    Pack years: 11.25    Types: Cigarettes   Smokeless tobacco: Never Used  Substance and Sexual Activity   Alcohol use: Yes    Comment: 04/08/2013 "haven't drank nothing in > 3 yr; before then I'd have at least 1 pint/day"   Drug use: No   Sexual activity: Yes  Lifestyle   Physical activity    Days per week: Not on file    Minutes per session: Not on file   Stress: Not on file  Relationships   Social connections    Talks on phone: Not on file    Gets together: Not on file    Attends religious service: Not on file    Active member of club or organization: Not on file    Attends meetings of clubs or organizations: Not on file    Relationship status: Not on file   Intimate partner violence    Fear of current or ex partner: Not on file    Emotionally abused: Not on  file    Physically abused: Not on file    Forced sexual activity: Not on file  Other Topics Concern   Not on file  Social History Narrative   Pt lives in 1 story home with his  wife   Has 2 adult children   Highest level of education: GED & Trade school   Retired Media planner.     Review of Systems  Constitutional: Negative for fatigue and unexpected weight change.  Eyes: Negative for visual disturbance.  Respiratory: Negative for cough, chest tightness and shortness of breath.   Cardiovascular: Negative for chest pain, palpitations and leg swelling.  Gastrointestinal: Negative for abdominal pain and blood in stool.  Neurological: Negative for dizziness, light-headedness and headaches.    Objective:   Physical Exam Vitals signs reviewed.  Constitutional:      Appearance: He is well-developed.  HENT:     Head: Normocephalic and atraumatic.  Eyes:     Pupils: Pupils are equal, round, and reactive to light.  Neck:     Vascular: No carotid bruit or JVD.  Cardiovascular:     Rate and Rhythm: Normal rate and regular rhythm.     Heart sounds: Normal heart sounds. No murmur.  Pulmonary:     Effort: Pulmonary effort is normal.     Breath sounds: Normal breath sounds. No rales.  Skin:    General: Skin is warm and dry.  Neurological:     Mental Status: He is alert and oriented to person, place, and time.    Vitals:   06/19/19 0810 06/19/19 0813  BP: (!) 167/75 (!) 164/80  Pulse: (!) 59   Temp: 97.9 F (36.6 C)   TempSrc: Oral   SpO2: 98%   Weight: 182 lb 9.6 oz (82.8 kg)       Assessment & Plan:  Phillip Cefaratti. is a 78 y.o. male . Essential hypertension - Plan: losartan-hydrochlorothiazide (HYZAAR) 50-12.5 MG tablet  -Decreased control.  Will change losartan HCT to 50/12.5 mg a full pill, which will be effective increase in hydrochlorothiazide.  Also plans to decrease beer Intake, continue with exercise for weight loss.  Recheck 6  weeks  Hyperlipidemia, unspecified hyperlipidemia type - Plan: atorvastatin (LIPITOR) 10 MG tablet  -Options discussed, with diabetes we will start Lipitor 10 mg daily.  Continue weight loss, exercise.   Type 2 diabetes mellitus without complication, without long-term current use of insulin (HCC)  -Diet controlled, expect improvement in A1c with weight loss and decreased beer intake  No orders of the defined types were placed in this encounter.  Patient Instructions    Change losartan HCTZ to the 50/12.5 mg dose, but take 1 full pill each day.  Lipitor once per day for cholesterol.  Continue to watch diet, exercise, decrease beer intake, and recheck in 6 weeks for diabetes, blood pressure and cholesterol.  If you have lab work done today you will be contacted with your lab results within the next 2 weeks.  If you have not heard from [place] then please contact us. The fastest way to get your results is to register for My Chart.   IF you received an x-ray today, you will receive an invoice from [facility]. Please contact [facility] at [phone] with questions or concerns regarding your invoice.   IF you received labwork today, you will receive an invoice from [facility]. Please contact [facility] at [phone] with questions or concerns regarding your invoice.   Our billing staff will not be able to assist you with questions regarding bills from these companies.  You will be contacted with the lab results as soon as they are available. The fastest way to get your results is to activate your My Chart account.  Instructions are located on the last page of this paperwork. If you have not heard from [place] regarding the results in 2 weeks, please contact this office.       Signed, [name], MD [facility]

## 2019-07-30 ENCOUNTER — Ambulatory Visit: Payer: Medicare HMO

## 2019-07-30 ENCOUNTER — Other Ambulatory Visit: Payer: Self-pay

## 2019-07-30 DIAGNOSIS — E119 Type 2 diabetes mellitus without complications: Secondary | ICD-10-CM | POA: Diagnosis not present

## 2019-07-30 DIAGNOSIS — I10 Essential (primary) hypertension: Secondary | ICD-10-CM

## 2019-07-30 DIAGNOSIS — E785 Hyperlipidemia, unspecified: Secondary | ICD-10-CM | POA: Diagnosis not present

## 2019-07-31 ENCOUNTER — Ambulatory Visit: Payer: Medicare HMO

## 2019-07-31 LAB — COMPREHENSIVE METABOLIC PANEL
ALT: 14 IU/L (ref 0–44)
AST: 16 IU/L (ref 0–40)
Albumin/Globulin Ratio: 1.3 (ref 1.2–2.2)
Albumin: 4 g/dL (ref 3.7–4.7)
Alkaline Phosphatase: 57 IU/L (ref 39–117)
BUN/Creatinine Ratio: 13 (ref 10–24)
BUN: 15 mg/dL (ref 8–27)
Bilirubin Total: 0.3 mg/dL (ref 0.0–1.2)
CO2: 23 mmol/L (ref 20–29)
Calcium: 9.4 mg/dL (ref 8.6–10.2)
Chloride: 102 mmol/L (ref 96–106)
Creatinine, Ser: 1.13 mg/dL (ref 0.76–1.27)
GFR calc Af Amer: 73 mL/min/{1.73_m2} (ref >59)
GFR calc non Af Amer: 63 mL/min/{1.73_m2} (ref >59)
Globulin, Total: 3 g/dL (ref 1.5–4.5)
Glucose: 97 mg/dL (ref 65–99)
Potassium: 4.2 mmol/L (ref 3.5–5.2)
Sodium: 140 mmol/L (ref 134–144)
Total Protein: 7 g/dL (ref 6.0–8.5)

## 2019-07-31 LAB — LIPID PANEL
Chol/HDL Ratio: 2.6 ratio (ref 0.0–5.0)
Cholesterol, Total: 118 mg/dL (ref 100–199)
HDL: 46 mg/dL (ref >39)
LDL Chol Calc (NIH): 48 mg/dL (ref 0–99)
Triglycerides: 136 mg/dL (ref 0–149)
VLDL Cholesterol Cal: 24 mg/dL (ref 5–40)

## 2019-07-31 LAB — HEMOGLOBIN A1C
Est. average glucose Bld gHb Est-mCnc: 140 mg/dL
Hgb A1c MFr Bld: 6.5 % — ABNORMAL HIGH (ref 4.8–5.6)

## 2019-08-17 DIAGNOSIS — Z03818 Encounter for observation for suspected exposure to other biological agents ruled out: Secondary | ICD-10-CM | POA: Diagnosis not present

## 2019-08-17 DIAGNOSIS — Z20828 Contact with and (suspected) exposure to other viral communicable diseases: Secondary | ICD-10-CM | POA: Diagnosis not present

## 2019-09-03 IMAGING — DX CHEST - 2 VIEW
2 series · 2 of 2 positions shown · non-contrast
Comparison: January 27, 2019

CLINICAL DATA: Shortness of breath

EXAM:
CHEST - 2 VIEW

[chest pa]
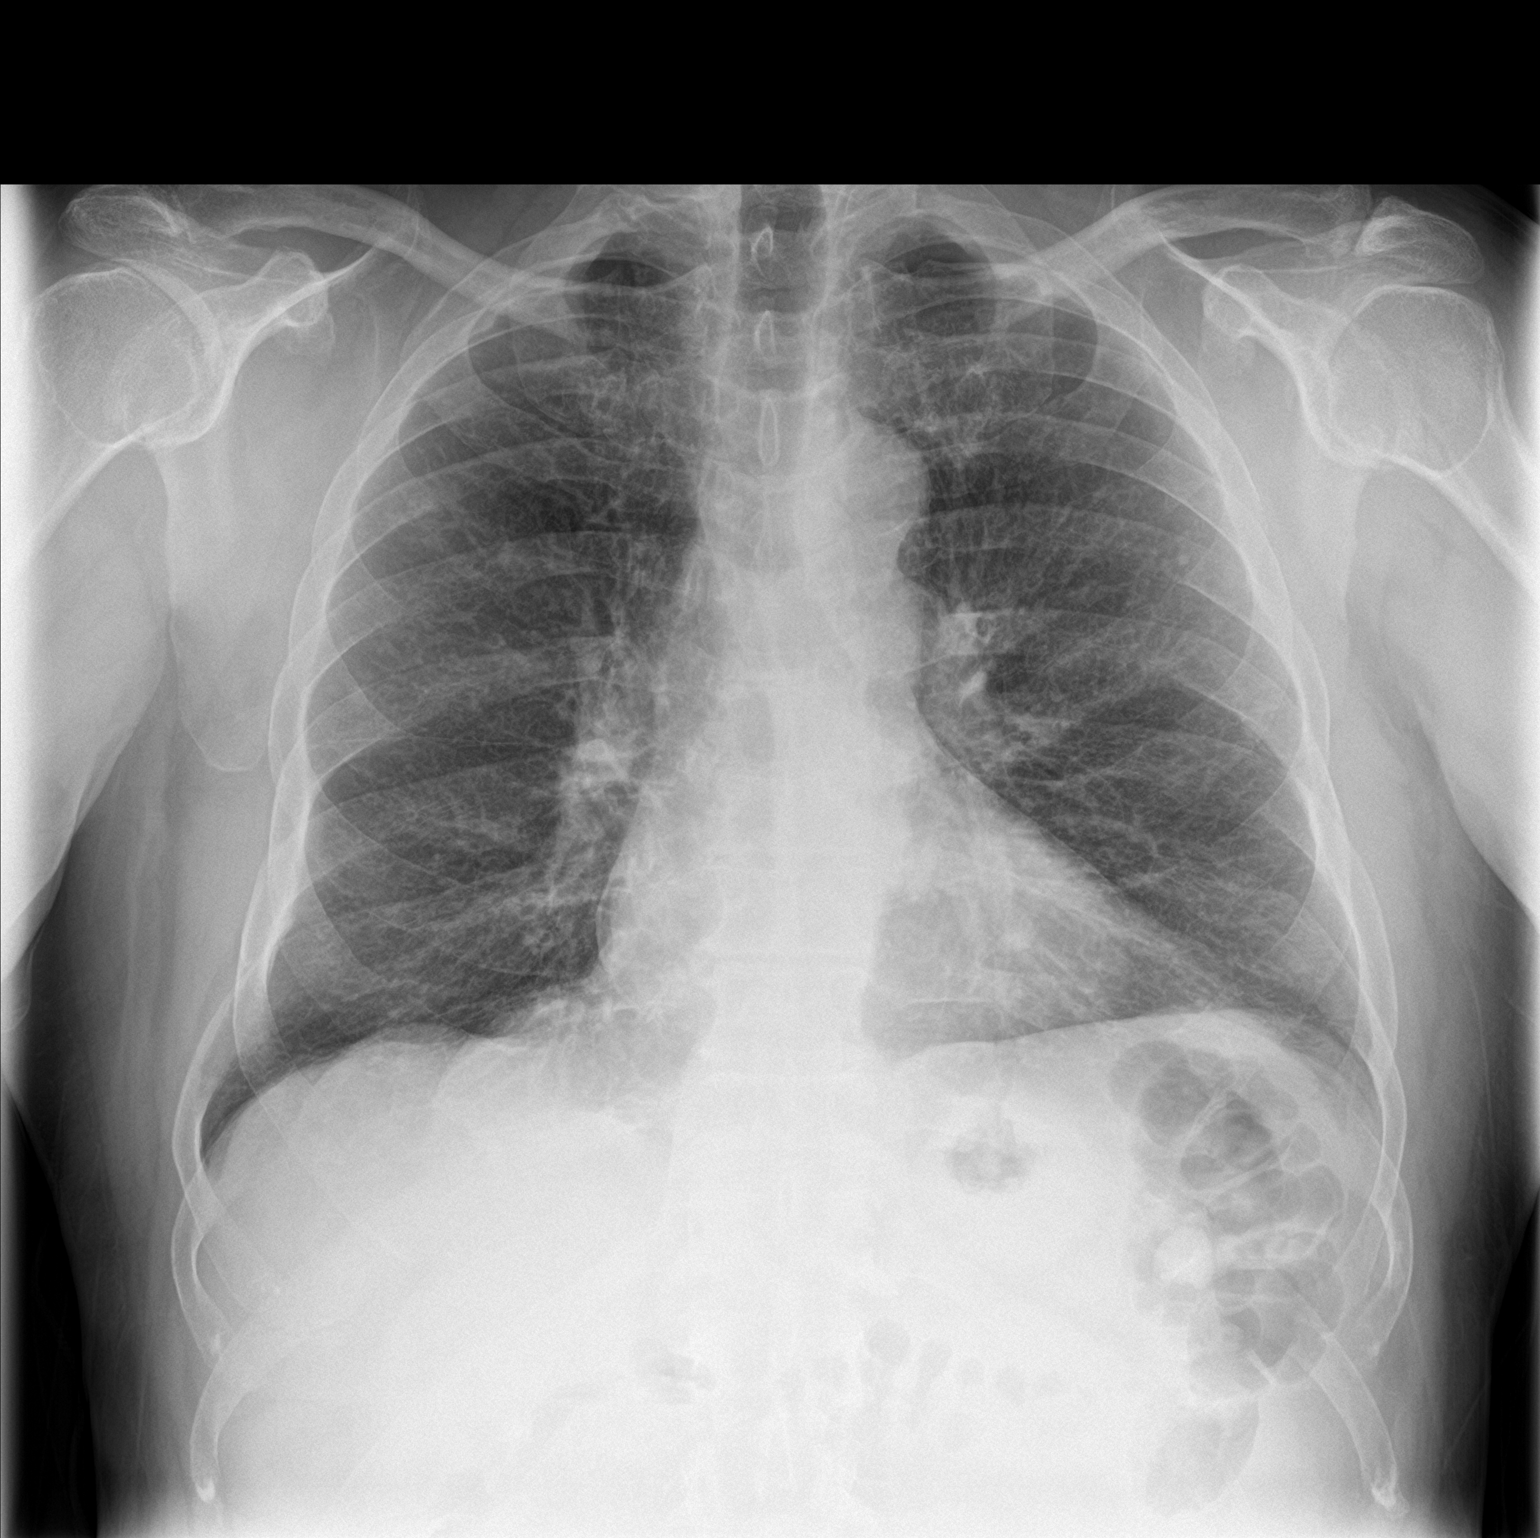

[chest lat]
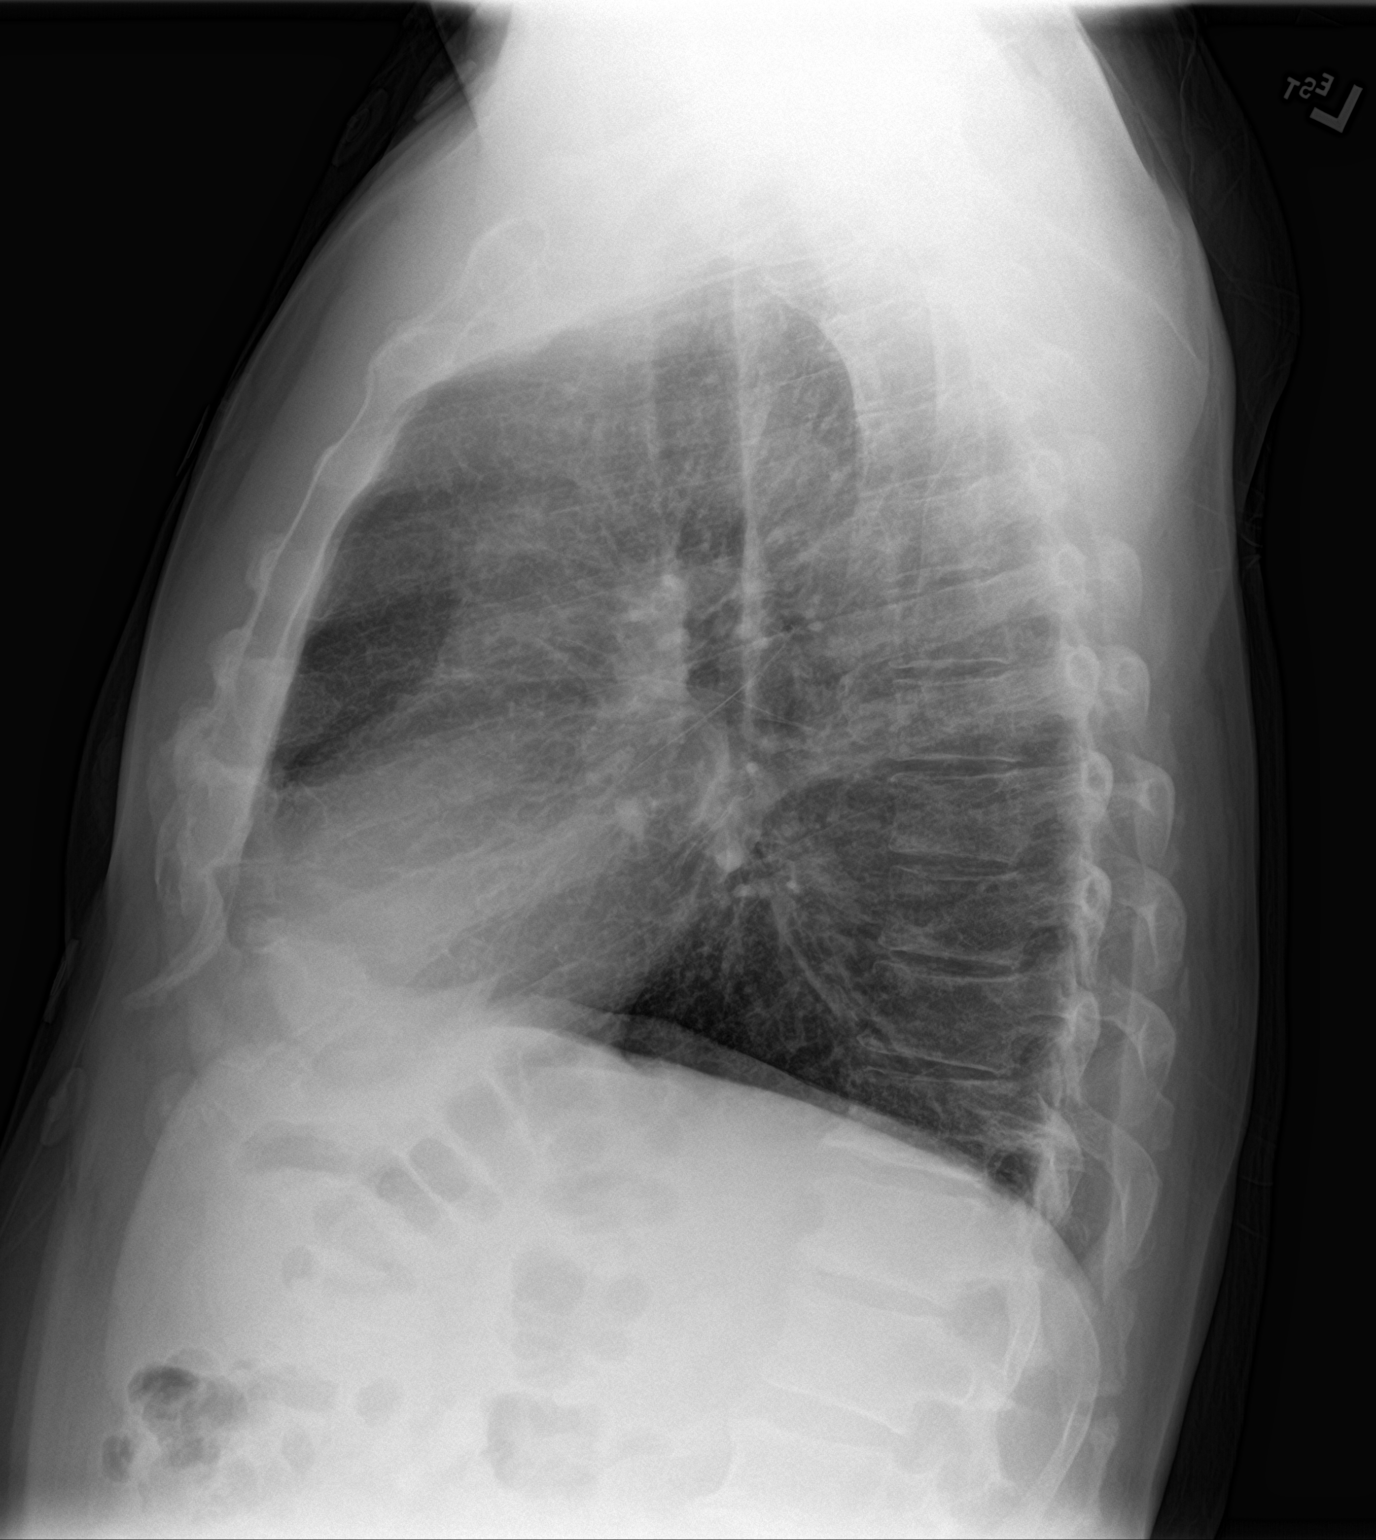

[2 of 2 positions shown; findings below may reference images not displayed]

FINDINGS: Heart size is stable. Chronic bronchitic changes are noted
bilaterally. There is no displaced fracture. No dislocation. There
is no pneumothorax. No large pleural effusion. Lungs are somewhat
hyperexpanded which can be seen in patients with COPD.
IMPRESSION: No active cardiopulmonary disease.

## 2019-09-05 ENCOUNTER — Encounter: Payer: Self-pay | Admitting: Gastroenterology

## 2019-09-11 ENCOUNTER — Other Ambulatory Visit: Payer: Self-pay

## 2019-09-11 ENCOUNTER — Ambulatory Visit (AMBULATORY_SURGERY_CENTER): Payer: Self-pay | Admitting: *Deleted

## 2019-09-11 VITALS — Temp 97.3°F | Ht 68.0 in | Wt 179.0 lb

## 2019-09-11 DIAGNOSIS — Z01818 Encounter for other preprocedural examination: Secondary | ICD-10-CM

## 2019-09-11 DIAGNOSIS — Z86010 Personal history of colonic polyps: Secondary | ICD-10-CM

## 2019-09-11 MED ORDER — NA SULFATE-K SULFATE-MG SULF 17.5-3.13-1.6 GM/177ML PO SOLN: ORAL | 0 refills | Status: DC

## 2019-09-11 NOTE — Progress Notes (Signed)
Patient is here in-person for PV. Patient denies any allergies to eggs or soy. Patient denies any problems with anesthesia/sedation. Patient denies any oxygen use at home. Patient denies taking any diet/weight loss medications or blood thinners. Patient is not being treated for MRSA or C-diff.   COVID-19 screening test is on 09/23/2019, the pt is aware. Pt is aware that care partner will wait in the car during procedure; if they feel like they will be too hot or cold to wait in the car; they may wait in the 4 th floor lobby. Patient is aware to bring only one care partner. We want them to wear a mask (we do not have any that we can provide them), practice social distancing, and we will check their temperatures when they get here.  I did remind the patient that their care partner needs to stay in the parking lot the entire time and have a cell phone available, we will call them when the pt is ready for discharge. Patient will wear mask into building.

## 2019-09-19 DIAGNOSIS — N5201 Erectile dysfunction due to arterial insufficiency: Secondary | ICD-10-CM | POA: Diagnosis not present

## 2019-09-19 DIAGNOSIS — R351 Nocturia: Secondary | ICD-10-CM | POA: Diagnosis not present

## 2019-09-19 DIAGNOSIS — N401 Enlarged prostate with lower urinary tract symptoms: Secondary | ICD-10-CM | POA: Diagnosis not present

## 2019-09-23 ENCOUNTER — Other Ambulatory Visit: Payer: Self-pay | Admitting: Gastroenterology

## 2019-09-23 ENCOUNTER — Ambulatory Visit (INDEPENDENT_AMBULATORY_CARE_PROVIDER_SITE_OTHER): Payer: Medicare HMO

## 2019-09-23 DIAGNOSIS — Z1159 Encounter for screening for other viral diseases: Secondary | ICD-10-CM

## 2019-09-23 LAB — SARS CORONAVIRUS 2 (TAT 6-24 HRS): SARS Coronavirus 2: NEGATIVE

## 2019-09-25 ENCOUNTER — Other Ambulatory Visit: Payer: Self-pay

## 2019-09-25 ENCOUNTER — Ambulatory Visit (AMBULATORY_SURGERY_CENTER): Payer: Medicare HMO | Admitting: Gastroenterology

## 2019-09-25 ENCOUNTER — Encounter: Payer: Self-pay | Admitting: Gastroenterology

## 2019-09-25 VITALS — BP 122/60 | HR 58 | Temp 98.2°F | Resp 11 | Ht 68.0 in | Wt 179.0 lb

## 2019-09-25 DIAGNOSIS — D123 Benign neoplasm of transverse colon: Secondary | ICD-10-CM | POA: Diagnosis not present

## 2019-09-25 DIAGNOSIS — Z86010 Personal history of colonic polyps: Secondary | ICD-10-CM

## 2019-09-25 DIAGNOSIS — D128 Benign neoplasm of rectum: Secondary | ICD-10-CM | POA: Diagnosis not present

## 2019-09-25 DIAGNOSIS — Z1211 Encounter for screening for malignant neoplasm of colon: Secondary | ICD-10-CM | POA: Diagnosis not present

## 2019-09-25 MED ORDER — SODIUM CHLORIDE 0.9 % IV SOLN: 500.0000 mL | Freq: Once | INTRAVENOUS | Status: DC

## 2019-09-25 NOTE — Progress Notes (Signed)
Called to room to assist during endoscopic procedure.  Patient ID and intended procedure confirmed with present staff. Received instructions for my participation in the procedure from the performing physician.  

## 2019-09-25 NOTE — Progress Notes (Signed)
To PACU, VSS. Report to RN.tb 

## 2019-09-25 NOTE — Progress Notes (Signed)
Temp-JB VS-KA  Pt's states no medical or surgical changes since previsit or office visit.

## 2019-09-25 NOTE — Op Note (Signed)
[place] Patient Name: Phillip Maynard Procedure Date: 09/25/2019 9:51 AM MRN: [id number] Endoscopist: Mauri Pole , MD Age: 79 Referring MD:  Date of Birth: 1941-04-23 Gender: Male Account #: [id number] Procedure:                Colonoscopy Indications:              High risk colon cancer surveillance: Personal                            history of colonic polyps, High risk colon cancer                            surveillance: Personal history of multiple (3 or                            more) adenomas Medicines:                Monitored Anesthesia Care Procedure:                Pre-Anesthesia Assessment:                           - Prior to the procedure, a History and Physical                            was performed, and patient medications and                            allergies were reviewed. The patient's tolerance of                            previous anesthesia was also reviewed. The risks                            and benefits of the procedure and the sedation                            options and risks were discussed with the patient.                            All questions were answered, and informed consent                            was obtained. Prior Anticoagulants: The patient has                            taken no previous anticoagulant or antiplatelet                            agents. ASA Grade Assessment: II - A patient with                            mild systemic disease. After reviewing the risks  and benefits, the patient was deemed in                            satisfactory condition to undergo the procedure.                           After obtaining informed consent, the colonoscope                            was passed under direct vision. Throughout the                            procedure, the patient's blood pressure, pulse, and                            oxygen saturations were monitored continuously.  The                            Colonoscope was introduced through the anus and                            advanced to the the cecum, identified by                            appendiceal orifice and ileocecal valve. The                            colonoscopy was performed without difficulty. The                            patient tolerated the procedure well. The quality                            of the bowel preparation was good. The ileocecal                            valve, appendiceal orifice, and rectum were                            photographed. Scope In: 9:57:17 AM Scope Out: 10:09:41 AM Scope Withdrawal Time: 0 hours 10 minutes 38 seconds  Total Procedure Duration: 0 hours 12 minutes 24 seconds  Findings:                 The perianal and digital rectal examinations were                            normal.                           Two sessile polyps were found in the rectum and                            transverse colon. The polyps were 1 to 2 mm in  size. These polyps were removed with a cold biopsy                            forceps. Resection and retrieval were complete.                           A 4 mm polyp was found in the transverse colon. The                            polyp was sessile. The polyp was removed with a                            cold snare. Resection and retrieval were complete.                           There was evidence of a prior end-to-side                            colo-colonic anastomosis in the recto-sigmoid                            colon. This was patent and was characterized by                            healthy appearing mucosa.                           Scattered small and large-mouthed diverticula were                            found in the descending colon and transverse colon. Complications:            No immediate complications. Estimated Blood Loss:     Estimated blood loss was minimal. Impression:                - Two 1 to 2 mm polyps in the rectum and in the                            transverse colon, removed with a cold biopsy                            forceps. Resected and retrieved.                           - One 4 mm polyp in the transverse colon, removed                            with a cold snare. Resected and retrieved.                           - Patent end-to-side colo-colonic anastomosis,                            characterized by healthy appearing mucosa.                           -  Diverticulosis in the descending colon and in the                            transverse colon. Recommendation:           - Patient has a contact number available for                            emergencies. The signs and symptoms of potential                            delayed complications were discussed with the                            patient. Return to normal activities tomorrow.                            Written discharge instructions were provided to the                            patient.                           - Resume previous diet.                           - Continue present medications.                           - Await pathology results.                           - No repeat colonoscopy due to age. Mauri Pole, MD 09/25/2019 10:18:48 AM This report has been signed electronically.

## 2019-09-25 NOTE — Patient Instructions (Signed)
YOU HAD AN ENDOSCOPIC PROCEDURE TODAY AT THE [facility]:   Refer to the procedure report that was given to you for any specific questions about what was found during the examination.  If the procedure report does not answer your questions, please call your gastroenterologist to clarify.  If you requested that your care partner not be given the details of your procedure findings, then the procedure report has been included in a sealed envelope for you to review at your convenience later.  YOU SHOULD EXPECT: Some feelings of bloating in the abdomen. Passage of more gas than usual.  Walking can help get rid of the air that was put into your GI tract during the procedure and reduce the bloating. If you had a lower endoscopy (such as a colonoscopy or flexible sigmoidoscopy) you may notice spotting of blood in your stool or on the toilet paper. If you underwent a bowel prep for your procedure, you may not have a normal bowel movement for a few days.  Please Note:  You might notice some irritation and congestion in your nose or some drainage.  This is from the oxygen used during your procedure.  There is no need for concern and it should clear up in a day or so.  SYMPTOMS TO REPORT IMMEDIATELY:   Following lower endoscopy (colonoscopy or flexible sigmoidoscopy):  Excessive amounts of blood in the stool  Significant tenderness or worsening of abdominal pains  Swelling of the abdomen that is new, acute  Fever of 100F or higher   For urgent or emergent issues, a gastroenterologist can be reached at any hour by calling [phone].   DIET:  We do recommend a small meal at first, but then you may proceed to your regular diet.  Drink plenty of fluids but you should avoid alcoholic beverages for 24 hours.  ACTIVITY:  You should plan to take it easy for the rest of today and you should NOT DRIVE or use heavy machinery until tomorrow (because of the sedation medicines used during the test).     FOLLOW UP: Our staff will call the number listed on your records 48-72 hours following your procedure to check on you and address any questions or concerns that you may have regarding the information given to you following your procedure. If we do not reach you, we will leave a message.  We will attempt to reach you two times.  During this call, we will ask if you have developed any symptoms of COVID 19. If you develop any symptoms (ie: fever, flu-like symptoms, shortness of breath, cough etc.) before then, please call [phone].  If you test positive for Covid 19 in the 2 weeks post procedure, please call and report this information to us.    If any biopsies were taken you will be contacted by phone or by letter within the next 1-3 weeks.  Please call us at [phone] if you have not heard about the biopsies in 3 weeks.    SIGNATURES/CONFIDENTIALITY: You and/or your care partner have signed paperwork which will be entered into your electronic medical record.  These signatures attest to the fact that that the information above on your After Visit Summary has been reviewed and is understood.  Full responsibility of the confidentiality of this discharge information lies with you and/or your care-partner.   Thank you for allowing us to provide your healthcare today.   

## 2019-09-27 ENCOUNTER — Telehealth: Payer: Self-pay

## 2019-09-27 ENCOUNTER — Telehealth: Payer: Self-pay | Admitting: *Deleted

## 2019-09-27 NOTE — Telephone Encounter (Signed)
  Follow up Call-  Call back number 09/25/2019  Post procedure Call Back phone  # [phone]  Permission to leave phone message Yes  Some recent data might be hidden     Patient questions:  Do you have a fever, pain , or abdominal swelling? No. Pain Score  0 *  Have you tolerated food without any problems? Yes.    Have you been able to return to your normal activities? Yes.    Do you have any questions about your discharge instructions: Diet   No. Medications  No. Follow up visit  No.  Do you have questions or concerns about your Care? No.  Actions: * If pain score is 4 or above: No action needed, pain <4.  1. Have you developed a fever since your procedure?no  2.   Have you had an respiratory symptoms (SOB or cough) since your procedure? no  3.   Have you tested positive for COVID 19 since your procedure no  4.   Have you had any family members/close contacts diagnosed with the COVID 19 since your procedure?  no   If yes to any of these questions please route to Joylene John, RN and Alphonsa Gin, Therapist, sports.

## 2019-09-27 NOTE — Telephone Encounter (Signed)
LVM

## 2019-10-03 ENCOUNTER — Encounter: Payer: Self-pay | Admitting: Gastroenterology

## 2019-10-06 ENCOUNTER — Ambulatory Visit: Payer: Medicare HMO | Attending: Internal Medicine

## 2019-10-06 DIAGNOSIS — Z23 Encounter for immunization: Secondary | ICD-10-CM | POA: Insufficient documentation

## 2019-10-06 NOTE — Progress Notes (Signed)
   [id number]  Name:  Phillip Maynard.    MRN: [id number] DOB: 19-Feb-1941  10/06/2019  [name] was observed post Covid-19 immunization for 30 minutes based on pre-vaccination screening without incidence. He was provided with Vaccine Information Sheet and instruction to access the V-Safe system.   [name] was instructed to call 911 with any severe reactions post vaccine: Marland Kitchen Difficulty breathing  . Swelling of your face and throat  . A fast heartbeat  . A bad rash all over your body  . Dizziness and weakness    Immunizations Administered    Name Date Dose VIS Date Route   Pfizer COVID-19 Vaccine 10/06/2019  2:17 PM 0.3 mL 07/26/2019 Intramuscular   Manufacturer: [place]   Lot: [id number]   [place]: [id number]

## 2019-10-30 ENCOUNTER — Ambulatory Visit: Payer: Medicare HMO | Attending: Internal Medicine

## 2019-10-30 DIAGNOSIS — Z23 Encounter for immunization: Secondary | ICD-10-CM

## 2019-10-30 NOTE — Progress Notes (Signed)
   [id number]  Name:  Travelle Jacobo.    MRN: [id number] DOB: 1940-09-03  10/30/2019  Mr. Jurado was observed post Covid-19 immunization for 30 minutes based on pre-vaccination screening without incident. He was provided with Vaccine Information Sheet and instruction to access the V-Safe system.   Mr. Bellman was instructed to call 911 with any severe reactions post vaccine: Marland Kitchen Difficulty breathing  . Swelling of face and throat  . A fast heartbeat  . A bad rash all over body  . Dizziness and weakness   Immunizations Administered    Name Date Dose VIS Date Route   Pfizer COVID-19 Vaccine 10/30/2019  9:56 AM 0.3 mL 07/26/2019 Intramuscular   Manufacturer: [place]   Lot: [id number]   [place]: [id number]

## 2019-11-12 ENCOUNTER — Encounter: Payer: Self-pay | Admitting: Family Medicine

## 2019-11-12 ENCOUNTER — Other Ambulatory Visit: Payer: Self-pay

## 2019-11-12 ENCOUNTER — Telehealth (INDEPENDENT_AMBULATORY_CARE_PROVIDER_SITE_OTHER): Payer: Medicare HMO | Admitting: Family Medicine

## 2019-11-12 DIAGNOSIS — R062 Wheezing: Secondary | ICD-10-CM

## 2019-11-12 MED ORDER — ALBUTEROL SULFATE HFA 108 (90 BASE) MCG/ACT IN AERS: 2.0000 | INHALATION_SPRAY | Freq: Four times a day (QID) | RESPIRATORY_TRACT | 0 refills | Status: DC | PRN

## 2019-11-12 NOTE — Progress Notes (Signed)
Virtual Visit Note  I connected with patient on 11/12/19 at 1153pm by phone per patient's preference and verified that I am speaking with the correct person using two identifiers. Phillip Barrette. is currently located at home and patient is currently with them during visit. The provider, Rutherford Guys, MD is located in their office at time of visit.  I discussed the limitations, risks, security and privacy concerns of performing an evaluation and management service by telephone and the availability of in person appointments. I also discussed with the patient that there may be a patient responsible charge related to this service. The patient expressed understanding and agreed to proceed.   I provided 10 minutes of non-face-to-face time during this encounter.  CC: allergies/wheezing  HPI ? Has been having issues with allergies for past several weeks, worse at night He reports nasal and chest congestion, dry coughing Has been taking OTC allergy med and cough syrup - which was working well for him until about 3-4 days ago when he started having wheezing with mild chest tightness and SOB He states in general his breathing is fine during the day but if he really pushes himself then he will get some mild wheezing He denies h/o asthma, he does smoke, he denies h/o COPD He reports that this has happened in the past during allergy season, he has been prescribed inhalers in the past, he uses his wife's albuterol and it really helps He denies any malaise, fever or chills, sinus pain or pressure   Allergies  Allergen Reactions  . Penicillins Anaphylaxis and Other (See Comments)    Has patient had a PCN reaction causing immediate rash, facial/tongue/throat swelling, SOB or lightheadedness with hypotension: Yes Has patient had a PCN reaction causing severe rash involving mucus membranes or skin necrosis: No Has patient had a PCN reaction that required hospitalization No Has patient had a PCN  reaction occurring within the last 10 years: No If all of the above answers are "NO", then may proceed with Cephalosporin use.  . Shellfish Allergy Anaphylaxis    Prior to Admission medications   Medication Sig Start Date End Date Taking? Authorizing Provider  atorvastatin (LIPITOR) 10 MG tablet Take 1 tablet (10 mg total) by mouth daily. 06/19/19  Yes Wendie Agreste, MD  finasteride (PROSCAR) 5 MG tablet Take 5 mg by mouth daily. 09/22/17  Yes [provider]  losartan-hydrochlorothiazide (HYZAAR) 50-12.5 MG tablet Take 1 tablet by mouth daily. 06/19/19  Yes Wendie Agreste, MD  tamsulosin (FLOMAX) 0.4 MG CAPS capsule Take 0.4 mg by mouth at bedtime.    Yes [provider]    Past Medical History:  Diagnosis Date  . Asthma    "when I was a boy" (04/08/2013)  . Bronchitis   . Colon polyps   . Colovesical fistula   . Diverticulosis   . GERD (gastroesophageal reflux disease)   . Hepatitis C    treated with injections  . High cholesterol   . Hypertension   . Type II diabetes mellitus (HCC)    hx of . No longer on medicine    Past Surgical History:  Procedure Laterality Date  . COLONOSCOPY  last 09/06/2016  . CYSTOSCOPY N/A 09/08/2016   Procedure: FIREFLY INJECTIONS;  Surgeon: Cleon Gustin, MD;  Location: [facility];  Service: Urology;  Laterality: N/A;  . INCISION AND DRAINAGE OF WOUND Right 1970's   "leg" (04/08/2013)  . LIVER BIOPSY  ~ 2011  . POLYPECTOMY    .  PROCTOSCOPY N/A 09/08/2016   Procedure: RIGID PROCTOSCOPY;  Surgeon: Michael Boston, MD;  Location: [facility];  Service: General;  Laterality: N/A;  . TONSILLECTOMY      Social History   Tobacco Use  . Smoking status: Current Every Day Smoker    Packs/day: 0.25    Years: 45.00    Pack years: 11.25    Types: Cigarettes  . Smokeless tobacco: Never Used  Substance Use Topics  . Alcohol use: Yes    Alcohol/week: 8.0 standard drinks    Types: 8 Cans of beer per week    Family History  Problem  Relation Age of Onset  . Asthma Mother   . Heart attack Mother   . Colon polyps Neg Hx   . Colon cancer Neg Hx   . Esophageal cancer Neg Hx   . Rectal cancer Neg Hx   . Stomach cancer Neg Hx     ROS Per hpi  Objective  Vitals as reported by the patient: none Gen: AAOx3, NAD Resp: speaking in full sentences, no respiratory distress   ASSESSMENT and PLAN  1. Wheezing Mild, recurrent during allergy season, smoker. No ssx of infectious process. Start albuterol prn. Reviewed r/se/b. RTC precautions given.   Other orders - albuterol (VENTOLIN HFA) 108 (90 Base) MCG/ACT inhaler; Inhale 2 puffs into the lungs every 6 (six) hours as needed for wheezing or shortness of breath.  FOLLOW-UP: prn   The above assessment and management plan was discussed with the patient. The patient verbalized understanding of and has agreed to the management plan. Patient is aware to call the clinic if symptoms persist or worsen. Patient is aware when to return to the clinic for a follow-up visit. Patient educated on when it is appropriate to go to the emergency department.     Rutherford Guys, MD [facility] at [facility], [place] [address] Ph.  [phone] Fax [phone]

## 2019-11-12 NOTE — Patient Instructions (Signed)
° ° ° °  If you have lab work done today you will be contacted with your lab results within the next 2 weeks.  If you have not heard from us then please contact us. The fastest way to get your results is to register for My Chart. ° ° °IF you received an x-ray today, you will receive an invoice from [facility]. Please contact [facility] at [phone] with questions or concerns regarding your invoice.  ° °IF you received labwork today, you will receive an invoice from [facility]. Please contact [facility] at [phone] with questions or concerns regarding your invoice.  ° °Our billing staff will not be able to assist you with questions regarding bills from these companies. ° °You will be contacted with the lab results as soon as they are available. The fastest way to get your results is to activate your My Chart account. Instructions are located on the last page of this paperwork. If you have not heard from us regarding the results in 2 weeks, please contact this office. °  ° ° ° °

## 2019-12-25 ENCOUNTER — Ambulatory Visit (INDEPENDENT_AMBULATORY_CARE_PROVIDER_SITE_OTHER): Payer: Medicare HMO | Admitting: Family Medicine

## 2019-12-25 VITALS — BP 118/64 | Ht 69.0 in | Wt 179.0 lb

## 2019-12-25 DIAGNOSIS — Z0000 Encounter for general adult medical examination without abnormal findings: Secondary | ICD-10-CM

## 2019-12-25 NOTE — Progress Notes (Signed)
Presents today for [employer] Visit   Date of last exam: 11/12/2019  Interpreter used for this visit?  No  I connected with  Phillip Maynard. on 12/25/19 by a telephone application and verified that I am speaking with the correct person using two identifiers.   I discussed the limitations of evaluation and management by telemedicine. The patient expressed understanding and agreed to proceed.    Patient Care Team: Wendie Agreste, MD as PCP - General (Family Medicine) Nickie Retort, MD as Consulting Physician (Urology) Michael Boston, MD as Consulting Physician (General Surgery) Mauri Pole, MD as Consulting Physician (Gastroenterology)   Other items to address today:   Discussed immunizations Discussed Eye/Dental   Other Screening: Last screening for diabetes: 07/30/2019 Last lipid screening: 07-30-2019  ADVANCE DIRECTIVES: Discussed: yes On File: no Materials Provided: yes  Immunization status:  Immunization History  Administered Date(s) Administered  . Fluad Quad(high Dose 65+) 05/06/2019  . Influenza, High Dose Seasonal PF 09/05/2018  . Influenza,inj,Quad PF,6+ Mos 05/27/2013, 09/22/2014, 05/23/2016  . PFIZER SARS-COV-2 Vaccination 10/06/2019, 10/30/2019  . Pneumococcal Conjugate-13 01/26/2015  . Pneumococcal Polysaccharide-23 01/18/2010  . Pneumococcal-Unspecified 04/16/2011  . Tdap 06/13/2011, 05/13/2017     There are no preventive care reminders to display for this patient.   Functional Status Survey: Is the patient deaf or have difficulty hearing?: No Does the patient have difficulty seeing, even when wearing glasses/contacts?: No Does the patient have difficulty concentrating, remembering, or making decisions?: No Does the patient have difficulty walking or climbing stairs?: No Does the patient have difficulty dressing or bathing?: No Does the patient have difficulty doing errands alone such as visiting a  doctor's office or shopping?: No   6CIT Screen 12/25/2019 11/19/2018 05/02/2017  What Year? 0 points 0 points 0 points  What month? 0 points 0 points 0 points  What time? 0 points 0 points 0 points  Count back from 20 0 points 0 points 0 points  Months in reverse 0 points 0 points 0 points  Repeat phrase 0 points 0 points 0 points  Total Score 0 0 0        Office Visit from 11/19/2018 in [facility]  AUDIT-C Score  0       Home Environment:   Lives in a one story home No trouble climbing stairs No scattered rugs Yes grab bars Adequate lighting/ no clutter   Patient Active Problem List   Diagnosis Date Noted  . Skin lesion 10/12/2018  . 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 Hypertension 02/28/2013  . Type 2 diabetes mellitus ([place]) 02/28/2013  . Chronic hepatitis C ([place]) 02/14/2011  . TOBACCO ABUSE 12/10/2007  . 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 Asthma    "when I was a boy" (04/08/2013)  . Bronchitis   . Colon polyps   . Colovesical fistula   . Diverticulosis   . GERD (gastroesophageal reflux disease)   . Hepatitis C    treated with injections  . High cholesterol   . Hypertension   . Type II diabetes mellitus (HCC)    hx of . No longer on medicine     Past Surgical History:  Procedure Laterality Date  . COLONOSCOPY  last 09/06/2016  . CYSTOSCOPY N/A 09/08/2016   Procedure: FIREFLY INJECTIONS;  Surgeon: Cleon Gustin, MD;  Location: [facility];  Service: Urology;  Laterality: N/A;  .  INCISION AND DRAINAGE OF WOUND Right 1970's   "leg" (04/08/2013)  . LIVER BIOPSY  ~ 2011  . POLYPECTOMY    . PROCTOSCOPY N/A 09/08/2016   Procedure: RIGID PROCTOSCOPY;  Surgeon: Michael Boston, MD;  Location: [facility];  Service: General;  Laterality: N/A;  . TONSILLECTOMY       Family History  Problem Relation Age of Onset  . Asthma Mother   . Heart attack Mother   . Colon polyps Neg Hx    . Colon cancer Neg Hx   . Esophageal cancer Neg Hx   . Rectal cancer Neg Hx   . Stomach cancer Neg Hx      Social History   Socioeconomic History  . Marital status: Married    Spouse name: Not on file  . Number of children: 2  . Years of education: Not on file  . Highest education level: Not on file  Occupational History  . Occupation: truck Education administrator: [employer]    Comment: retired  Tobacco Use  . Smoking status: Current Every Day Smoker    Packs/day: 0.25    Years: 45.00    Pack years: 11.25    Types: Cigarettes  . Smokeless tobacco: Never Used  Substance and Sexual Activity  . Alcohol use: Yes    Alcohol/week: 8.0 standard drinks    Types: 8 Cans of beer per week  . Drug use: No  . Sexual activity: Yes  Other Topics Concern  . Not on file  Social History Narrative   Pt lives in 1 story home with his wife   Has 2 adult children   Highest level of education: GED & Trade school   Retired Media planner.    Social Determinants of Health   Financial Resource Strain:   . Difficulty of Paying Living Expenses:   Food Insecurity:   . Worried About Charity fundraiser in the Last Year:   . Arboriculturist in the Last Year:   Transportation Needs:   . Film/video editor (Medical):   Marland Kitchen Lack of Transportation (Non-Medical):   Physical Activity:   . Days of Exercise per Week:   . Minutes of Exercise per Session:   Stress:   . Feeling of Stress :   Social Connections:   . Frequency of Communication with Friends and Family:   . Frequency of Social Gatherings with Friends and Family:   . Attends Religious Services:   . Active Member of Clubs or Organizations:   . Attends Archivist Meetings:   Marland Kitchen Marital Status:   Intimate Partner Violence:   . Fear of Current or Ex-Partner:   . Emotionally Abused:   Marland Kitchen Physically Abused:   . Sexually Abused:      Allergies  Allergen Reactions  . Penicillins Anaphylaxis and Other (See  Comments)    Has patient had a PCN reaction causing immediate rash, facial/tongue/throat swelling, SOB or lightheadedness with hypotension: Yes Has patient had a PCN reaction causing severe rash involving mucus membranes or skin necrosis: No Has patient had a PCN reaction that required hospitalization No Has patient had a PCN reaction occurring within the last 10 years: No If all of the above answers are "NO", then may proceed with Cephalosporin use.  . Shellfish Allergy Anaphylaxis     Prior to Admission medications   Medication Sig Start Date End Date Taking? Authorizing Provider  albuterol (VENTOLIN HFA) 108 (90 Base) MCG/ACT inhaler Inhale  2 puffs into the lungs every 6 (six) hours as needed for wheezing or shortness of breath. 11/12/19  Yes Rutherford Guys, MD  atorvastatin (LIPITOR) 10 MG tablet Take 1 tablet (10 mg total) by mouth daily. 06/19/19  Yes Wendie Agreste, MD  finasteride (PROSCAR) 5 MG tablet Take 5 mg by mouth daily. 09/22/17  Yes [provider]  losartan-hydrochlorothiazide (HYZAAR) 50-12.5 MG tablet Take 1 tablet by mouth daily. 06/19/19  Yes Wendie Agreste, MD  tamsulosin (FLOMAX) 0.4 MG CAPS capsule Take 0.4 mg by mouth at bedtime.    Yes [provider]     Depression screen [facility] 2/9 12/25/2019 11/12/2019 06/19/2019 05/06/2019 04/04/2019  Decreased Interest 0 0 0 0 0  Down, Depressed, Hopeless 0 0 0 0 0  PHQ - 2 Score 0 0 0 0 0  Altered sleeping - - - - -  Tired, decreased energy - - - - -  Change in appetite - - - - -  Feeling bad or failure about yourself  - - - - -  Trouble concentrating - - - - -  Moving slowly or fidgety/restless - - - - -  Suicidal thoughts - - - - -  PHQ-9 Score - - - - -     Fall Risk  12/25/2019 11/12/2019 06/19/2019 05/06/2019 04/04/2019  Falls in the past year? 0 0 0 0 0  Number falls in past yr: 0 0 0 0 0  Injury with Fall? 0 0 0 0 0  Follow up Falls evaluation completed;Education provided - Falls evaluation  completed Falls evaluation completed -      PHYSICAL EXAM: BP 118/64 Comment: taken from a previous visit  Ht 5\' 9"  (1.753 m)   Wt 179 lb (81.2 kg)   BMI 26.43 kg/m    Wt Readings from Last 3 Encounters:  12/25/19 179 lb (81.2 kg)  09/25/19 179 lb (81.2 kg)  09/11/19 179 lb (81.2 kg)       Education/Counseling provided regarding diet and exercise, prevention of chronic diseases, smoking/tobacco cessation, if applicable, and reviewed "Covered Medicare Preventive Services."

## 2019-12-25 NOTE — Patient Instructions (Addendum)
Thank you for taking time to come for your Medicare Wellness Visit. I appreciate your ongoing commitment to your health goals. Please review the following plan we discussed and let me know if I can assist you in the future.  Phillip Kennedy LPN  Preventive Care 77 Years and Older, Male Preventive care refers to lifestyle choices and visits with your health care provider that can promote health and wellness. This includes:  A yearly physical exam. This is also called an annual well check.  Regular dental and eye exams.  Immunizations.  Screening for certain conditions.  Healthy lifestyle choices, such as diet and exercise. What can I expect for my preventive care visit? Physical exam Your health care provider will check:  Height and weight. These may be used to calculate body mass index (BMI), which is a measurement that tells if you are at a healthy weight.  Heart rate and blood pressure.  Your skin for abnormal spots. Counseling Your health care provider may ask you questions about:  Alcohol, tobacco, and drug use.  Emotional well-being.  Home and relationship well-being.  Sexual activity.  Eating habits.  History of falls.  Memory and ability to understand (cognition).  Work and work Statistician. What immunizations do I need?  Influenza (flu) vaccine  This is recommended every year. Tetanus, diphtheria, and pertussis (Tdap) vaccine  You may need a Td booster every 10 years. Varicella (chickenpox) vaccine  You may need this vaccine if you have not already been vaccinated. Zoster (shingles) vaccine  You may need this after age 66. Pneumococcal conjugate (PCV13) vaccine  One dose is recommended after age 84. Pneumococcal polysaccharide (PPSV23) vaccine  One dose is recommended after age 88. Measles, mumps, and rubella (MMR) vaccine  You may need at least one dose of MMR if you were born in 1957 or later. You may also need a second dose. Meningococcal  conjugate (MenACWY) vaccine  You may need this if you have certain conditions. Hepatitis A vaccine  You may need this if you have certain conditions or if you travel or work in places where you may be exposed to hepatitis A. Hepatitis B vaccine  You may need this if you have certain conditions or if you travel or work in places where you may be exposed to hepatitis B. Haemophilus influenzae type b (Hib) vaccine  You may need this if you have certain conditions. You may receive vaccines as individual doses or as more than one vaccine together in one shot (combination vaccines). Talk with your health care provider about the risks and benefits of combination vaccines. What tests do I need? Blood tests  Lipid and cholesterol levels. These may be checked every 5 years, or more frequently depending on your overall health.  Hepatitis C test.  Hepatitis B test. Screening  Lung cancer screening. You may have this screening every year starting at age 24 if you have a 30-pack-year history of smoking and currently smoke or have quit within the past 15 years.  Colorectal cancer screening. All adults should have this screening starting at age 52 and continuing until age 61. Your health care provider may recommend screening at age 57 if you are at increased risk. You will have tests every 1-10 years, depending on your results and the type of screening test.  Prostate cancer screening. Recommendations will vary depending on your family history and other risks.  Diabetes screening. This is done by checking your blood sugar (glucose) after you have not eaten for  a while (fasting). You may have this done every 1-3 years.  Abdominal aortic aneurysm (AAA) screening. You may need this if you are a current or former smoker.  Sexually transmitted disease (STD) testing. Follow these instructions at home: Eating and drinking  Eat a diet that includes fresh fruits and vegetables, whole grains, lean  protein, and low-fat dairy products. Limit your intake of foods with high amounts of sugar, saturated fats, and salt.  Take vitamin and mineral supplements as recommended by your health care provider.  Do not drink alcohol if your health care provider tells you not to drink.  If you drink alcohol: ? Limit how much you have to 0-2 drinks a day. ? Be aware of how much alcohol is in your drink. In the [place] one drink equals one 12 oz bottle of beer (355 mL), one 5 oz glass of wine (148 mL), or one 1 oz glass of hard liquor (44 mL). Lifestyle  Take daily care of your teeth and gums.  Stay active. Exercise for at least 30 minutes on 5 or more days each week.  Do not use any products that contain nicotine or tobacco, such as cigarettes, e-cigarettes, and chewing tobacco. If you need help quitting, ask your health care provider.  If you are sexually active, practice safe sex. Use a condom or other form of protection to prevent STIs (sexually transmitted infections).  Talk with your health care provider about taking a low-dose aspirin or statin. What's next?  Visit your health care provider once a year for a well check visit.  Ask your health care provider how often you should have your eyes and teeth checked.  Stay up to date on all vaccines. This information is not intended to replace advice given to you by your health care provider. Make sure you discuss any questions you have with your health care provider. Document Revised: 07/26/2018 Document Reviewed: 07/26/2018 Elsevier Patient Education  2020 Elsevier Inc.  

## 2020-01-06 ENCOUNTER — Other Ambulatory Visit: Payer: Self-pay | Admitting: Family Medicine

## 2020-01-06 DIAGNOSIS — I10 Essential (primary) hypertension: Secondary | ICD-10-CM

## 2020-01-06 DIAGNOSIS — E785 Hyperlipidemia, unspecified: Secondary | ICD-10-CM

## 2020-01-06 NOTE — Telephone Encounter (Signed)
Requested Prescriptions  Pending Prescriptions Disp Refills  . losartan-hydrochlorothiazide (HYZAAR) 50-12.5 MG tablet [Pharmacy Med Name: LOSARTAN POTASSIUM/HYDROCHLOROTHIAZIDE 50-12.5 MG Tablet] 90 tablet 1    Sig: TAKE 1 TABLET EVERY DAY     Cardiovascular: ARB + Diuretic Combos Passed - 01/06/2020  2:01 PM      Passed - K in normal range and within 180 days    Potassium  Date Value Ref Range Status  07/30/2019 4.2 3.5 - 5.2 mmol/L Final         Passed - Na in normal range and within 180 days    Sodium  Date Value Ref Range Status  07/30/2019 140 134 - 144 mmol/L Final         Passed - Cr in normal range and within 180 days    Creat  Date Value Ref Range Status  05/23/2016 1.03 0.70 - 1.18 mg/dL Final    Comment:      For patients > or = 77 years of age: The upper reference limit for Creatinine is approximately 13% higher for people identified as African-American.      Creatinine, Ser  Date Value Ref Range Status  07/30/2019 1.13 0.76 - 1.27 mg/dL Final         Passed - Ca in normal range and within 180 days    Calcium  Date Value Ref Range Status  07/30/2019 9.4 8.6 - 10.2 mg/dL Final         Passed - Patient is not pregnant      Passed - Last BP in normal range    BP Readings from Last 1 Encounters:  12/25/19 118/64         Passed - Valid encounter within last 6 months    Recent Outpatient Visits          1 week ago Medicare annual wellness visit, subsequent   [facility], Ranell Patrick, MD   1 month ago Wheezing   [facility], Lilia Argue, MD   6 months ago Essential hypertension   [facility], Ranell Patrick, MD   8 months ago Essential hypertension   [facility], Ranell Patrick, MD   9 months ago PND (paroxysmal nocturnal dyspnea)   [facility], Ranell Patrick, MD             . atorvastatin (LIPITOR) 10 MG tablet [Pharmacy Med Name: ATORVASTATIN CALCIUM 10 MG Tablet] 90  tablet 1    Sig: TAKE 1 TABLET EVERY DAY     Cardiovascular:  Antilipid - Statins Failed - 01/06/2020  2:01 PM      Failed - LDL in normal range and within 360 days    LDL Chol Calc (NIH)  Date Value Ref Range Status  07/30/2019 48 0 - 99 mg/dL Final         Passed - Total Cholesterol in normal range and within 360 days    Cholesterol, Total  Date Value Ref Range Status  07/30/2019 118 100 - 199 mg/dL Final         Passed - HDL in normal range and within 360 days    HDL  Date Value Ref Range Status  07/30/2019 46 >39 mg/dL Final         Passed - Triglycerides in normal range and within 360 days    Triglycerides  Date Value Ref Range Status  07/30/2019 136 0 - 149 mg/dL Final  Passed - Patient is not pregnant      Passed - Valid encounter within last 12 months    Recent Outpatient Visits          1 week ago Medicare annual wellness visit, subsequent   [facility], Ranell Patrick, MD   1 month ago Wheezing   [facility], Lilia Argue, MD   6 months ago Essential hypertension   [facility], MD   8 months ago Essential hypertension   [facility], MD   9 months ago PND (paroxysmal nocturnal dyspnea)   [facility], Ranell Patrick, MD

## 2020-04-01 DIAGNOSIS — H524 Presbyopia: Secondary | ICD-10-CM | POA: Diagnosis not present

## 2020-04-01 DIAGNOSIS — H11153 Pinguecula, bilateral: Secondary | ICD-10-CM | POA: Diagnosis not present

## 2020-04-01 DIAGNOSIS — E119 Type 2 diabetes mellitus without complications: Secondary | ICD-10-CM | POA: Diagnosis not present

## 2020-04-01 DIAGNOSIS — H52223 Regular astigmatism, bilateral: Secondary | ICD-10-CM | POA: Diagnosis not present

## 2020-04-01 DIAGNOSIS — I10 Essential (primary) hypertension: Secondary | ICD-10-CM | POA: Diagnosis not present

## 2020-04-01 DIAGNOSIS — H18413 Arcus senilis, bilateral: Secondary | ICD-10-CM | POA: Diagnosis not present

## 2020-04-01 DIAGNOSIS — H25093 Other age-related incipient cataract, bilateral: Secondary | ICD-10-CM | POA: Diagnosis not present

## 2020-04-01 LAB — HM DIABETES EYE EXAM

## 2020-04-06 ENCOUNTER — Ambulatory Visit (INDEPENDENT_AMBULATORY_CARE_PROVIDER_SITE_OTHER): Payer: Medicare HMO | Admitting: Family Medicine

## 2020-04-06 ENCOUNTER — Other Ambulatory Visit: Payer: Self-pay

## 2020-04-06 ENCOUNTER — Ambulatory Visit (INDEPENDENT_AMBULATORY_CARE_PROVIDER_SITE_OTHER): Payer: Medicare HMO

## 2020-04-06 VITALS — BP 142/68 | HR 60 | Temp 98.0°F | Resp 15 | Ht 69.0 in | Wt 188.8 lb

## 2020-04-06 DIAGNOSIS — R05 Cough: Secondary | ICD-10-CM | POA: Diagnosis not present

## 2020-04-06 DIAGNOSIS — E119 Type 2 diabetes mellitus without complications: Secondary | ICD-10-CM | POA: Diagnosis not present

## 2020-04-06 DIAGNOSIS — R062 Wheezing: Secondary | ICD-10-CM

## 2020-04-06 DIAGNOSIS — F17210 Nicotine dependence, cigarettes, uncomplicated: Secondary | ICD-10-CM

## 2020-04-06 DIAGNOSIS — I10 Essential (primary) hypertension: Secondary | ICD-10-CM | POA: Diagnosis not present

## 2020-04-06 DIAGNOSIS — R0600 Dyspnea, unspecified: Secondary | ICD-10-CM

## 2020-04-06 DIAGNOSIS — F1010 Alcohol abuse, uncomplicated: Secondary | ICD-10-CM | POA: Diagnosis not present

## 2020-04-06 DIAGNOSIS — E785 Hyperlipidemia, unspecified: Secondary | ICD-10-CM | POA: Diagnosis not present

## 2020-04-06 DIAGNOSIS — R0609 Other forms of dyspnea: Secondary | ICD-10-CM

## 2020-04-06 LAB — HM DIABETES EYE EXAM

## 2020-04-06 MED ORDER — ALBUTEROL SULFATE HFA 108 (90 BASE) MCG/ACT IN AERS: 2.0000 | INHALATION_SPRAY | Freq: Four times a day (QID) | RESPIRATORY_TRACT | 0 refills | Status: DC | PRN

## 2020-04-06 MED ORDER — LOSARTAN POTASSIUM-HCTZ 50-12.5 MG PO TABS: 1.0000 | ORAL_TABLET | Freq: Every day | ORAL | 1 refills | Status: DC

## 2020-04-06 MED ORDER — FLOVENT HFA 110 MCG/ACT IN AERO: 1.0000 | INHALATION_SPRAY | Freq: Two times a day (BID) | RESPIRATORY_TRACT | 3 refills | Status: DC

## 2020-04-06 NOTE — Patient Instructions (Addendum)
  Decrease alcohol in half initially. If you have difficulty cutting back, let me know. That should help with blood pressure as well - no med changes.   High dose flu vaccine will be here spetember 30th.   Start Flovent 1 puff twice per day every day for wheezing.  I will check a heart failure test but that is less likely.  Continue albuterol if needed for breakthrough wheezing.  Let me know if we can help you quit smoking.  That is very important to help with wheezing and lung symptoms.  Recheck in the next 2 weeks, sooner if worse.  Return to the clinic or go to the nearest emergency room if any of your symptoms worsen or new symptoms occur.   If you have lab work done today you will be contacted with your lab results within the next 2 weeks.  If you have not heard from [place] then please contact us. The fastest way to get your results is to register for My Chart.   IF you received an x-ray today, you will receive an invoice from [facility]. Please contact [facility] at [phone] with questions or concerns regarding your invoice.   IF you received labwork today, you will receive an invoice from [facility]. Please contact [facility] at [phone] with questions or concerns regarding your invoice.   Our billing staff will not be able to assist you with questions regarding bills from these companies.  You will be contacted with the lab results as soon as they are available. The fastest way to get your results is to activate your My Chart account. Instructions are located on the last page of this paperwork. If you have not heard from [place] regarding the results in 2 weeks, please contact this office.

## 2020-04-06 NOTE — Progress Notes (Signed)
Subjective:  Patient ID: Phillip Maynard., male    DOB: [birth year]  Age: 79 y.o. MRN: [id number]  CC:  Chief Complaint  Patient presents with  . chest congestion    pt has chest congestion at night before he goes to bed, pt not sure what is causing this states his ventolin inhaler helps at times but has run out would like a refill, pt also notes some wheezing in his chest, pt states this has been a constant for several weeks does have some good nights throughout the week, pt reports it is not getting worse but not better to [place] [address] [place]   . Hypertension    pt needs refill Losartan-HCTZ, pt wants this sent to [facility] mail order, BP high in office today, pt reports normal readings at home   . Advice Only    pt wants to know if he will need 3rd dose COVID vaccine, also whether he should wait and get high dose flu shot as thats what hes been getting or if he should get the one we already have.     HPI Phillip Maynard [employer]. presents for   Hypertension: Losartan HCT 50/12.5 mg daily. No missed doses, no new side effects.  Drinking more beer -6 per day. Denies addiction.  Also on proscar - rx by urology for BPH.  Home readings: 135/80.  BP Readings from Last 3 Encounters:  04/06/20 (!) 142/68  12/25/19 118/64  09/25/19 122/60   Lab Results  Component Value Date   CREATININE 1.04 04/06/2020   Diabetes: Prior prediabetes, then diabetes range in September last year, improved in December.  No current medications. Home blood sugar 120-145. No 200's.  On statin, ARB.  Microalbumin: normal 04/2019.  Optho, foot exam, pneumovax:  Optho. appt last week. Cataracts. Up to date.   Lab Results  Component Value Date   HGBA1C 7.2 (H) 04/06/2020   HGBA1C 6.5 (H) 07/30/2019   HGBA1C 6.9 (H) 05/06/2019   Lab Results  Component Value Date   MICROALBUR 11.5 09/23/2015   LDLCALC 77 04/06/2020   CREATININE 1.04 04/06/2020    Chest congestion Evaluated in March with Dr.  Pamella Maynard, wheezing at that time, more with increased activity.  History of allergies with some flares at that time.  Was using his wife's albuterol with some relief.  Albuterol as needed provided. Chest x-ray in August 2020 with chronic bronchitic changes.  Somewhat hyperexpanded, suspicious for COPD but no active cardiopulmonary disease.   Less active this summer. Drinking 6 beers per day. Denies syncope, DUI, no legal/family problems.   Tightness in chest with some wheezing at night - 2-3 time per week. No chest pain.  better with albuterol 2 puffs, then able to sleep normally.  Still smoking cigarettes - 1 pack per day and marijuana few times per week. No other IDU. No fever.  Denies leg swelling.  No paroxysmal nocturnal dyspnea.  No orthopnea- 2 pillows for comfort.  Dyspnea walking up hills/exertion past 2 months. Short of breath to get mail past month.   History Patient Active Problem List   Diagnosis Date Noted  . Skin lesion 10/12/2018  . 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 Hypertension 02/28/2013  . Type 2 diabetes mellitus ([place]) 02/28/2013  . Chronic hepatitis C ([place]) 02/14/2011  . TOBACCO ABUSE 12/10/2007  . 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 Asthma    "when I was a boy" (04/08/2013)  .  Bronchitis   . Colon polyps   . Colovesical fistula   . Diverticulosis   . GERD (gastroesophageal reflux disease)   . Hepatitis C    treated with injections  . High cholesterol   . Hypertension   . Type II diabetes mellitus (HCC)    hx of . No longer on medicine   Past Surgical History:  Procedure Laterality Date  . COLONOSCOPY  last 09/06/2016  . CYSTOSCOPY N/A 09/08/2016   Procedure: FIREFLY INJECTIONS;  Surgeon: Cleon Gustin, MD;  Location: [facility];  Service: Urology;  Laterality: N/A;  . INCISION AND DRAINAGE OF WOUND Right 1970's   "leg" (04/08/2013)  . LIVER BIOPSY  ~ 2011  . POLYPECTOMY     . PROCTOSCOPY N/A 09/08/2016   Procedure: RIGID PROCTOSCOPY;  Surgeon: Michael Boston, MD;  Location: [facility];  Service: General;  Laterality: N/A;  . TONSILLECTOMY     Allergies  Allergen Reactions  . Penicillins Anaphylaxis and Other (See Comments)    Has patient had a PCN reaction causing immediate rash, facial/tongue/throat swelling, SOB or lightheadedness with hypotension: Yes Has patient had a PCN reaction causing severe rash involving mucus membranes or skin necrosis: No Has patient had a PCN reaction that required hospitalization No Has patient had a PCN reaction occurring within the last 10 years: No If all of the above answers are "NO", then may proceed with Cephalosporin use.  . Shellfish Allergy Anaphylaxis   Prior to Admission medications   Medication Sig Start Date End Date Taking? Authorizing Provider  albuterol (VENTOLIN HFA) 108 (90 Base) MCG/ACT inhaler Inhale 2 puffs into the lungs every 6 (six) hours as needed for wheezing or shortness of breath. 11/12/19  Yes Rutherford Guys, MD  atorvastatin (LIPITOR) 10 MG tablet TAKE 1 TABLET EVERY DAY 01/06/20  Yes Wendie Agreste, MD  finasteride (PROSCAR) 5 MG tablet Take 5 mg by mouth daily. 09/22/17  Yes [provider]  losartan-hydrochlorothiazide (HYZAAR) 50-12.5 MG tablet TAKE 1 TABLET EVERY DAY 01/06/20  Yes Wendie Agreste, MD  tamsulosin (FLOMAX) 0.4 MG CAPS capsule Take 0.4 mg by mouth at bedtime.    Yes [provider]   Social History   Socioeconomic History  . Marital status: Married    Spouse name: Not on file  . Number of children: 2  . Years of education: Not on file  . Highest education level: Not on file  Occupational History  . Occupation: truck Education administrator: [employer]    Comment: retired  Tobacco Use  . Smoking status: Current Every Day Smoker    Packs/day: 0.25    Years: 45.00    Pack years: 11.25    Types: Cigarettes  . Smokeless tobacco: Never Used  Vaping Use  .  Vaping Use: Never used  Substance and Sexual Activity  . Alcohol use: Yes    Alcohol/week: 8.0 standard drinks    Types: 8 Cans of beer per week  . Drug use: No  . Sexual activity: Yes  Other Topics Concern  . Not on file  Social History Narrative   Pt lives in 1 story home with his wife   Has 2 adult children   Highest level of education: GED & Trade school   Retired Media planner.    Social Determinants of Health   Financial Resource Strain:   . Difficulty of Paying Living Expenses: Not on file  Food Insecurity:   . Worried About Running  Out of Food in the Last Year: Not on file  . Ran Out of Food in the Last Year: Not on file  Transportation Needs:   . Lack of Transportation (Medical): Not on file  . Lack of Transportation (Non-Medical): Not on file  Physical Activity:   . Days of Exercise per Week: Not on file  . Minutes of Exercise per Session: Not on file  Stress:   . Feeling of Stress : Not on file  Social Connections:   . Frequency of Communication with Friends and Family: Not on file  . Frequency of Social Gatherings with Friends and Family: Not on file  . Attends Religious Services: Not on file  . Active Member of Clubs or Organizations: Not on file  . Attends Archivist Meetings: Not on file  . Marital Status: Not on file  Intimate Partner Violence:   . Fear of Current or Ex-Partner: Not on file  . Emotionally Abused: Not on file  . Physically Abused: Not on file  . Sexually Abused: Not on file    Review of Systems Per HPI.   Objective:   Vitals:   04/06/20 0951 04/06/20 1008  BP: (!) 162/79 (!) 142/68  Pulse: 60   Resp: 15   Temp: 98 F (36.7 C)   TempSrc: Temporal   SpO2: 94%   Weight: 188 lb 12.8 oz (85.6 kg)   Height: 5\' 9"  (1.753 m)      Physical Exam Vitals reviewed.  Constitutional:      General: He is not in acute distress.    Appearance: Normal appearance. He is well-developed. He is not diaphoretic.  HENT:      Head: Normocephalic and atraumatic.  Eyes:     Pupils: Pupils are equal, round, and reactive to light.  Neck:     Vascular: No carotid bruit or JVD.  Cardiovascular:     Rate and Rhythm: Normal rate and regular rhythm.     Heart sounds: Normal heart sounds. No murmur heard.   Pulmonary:     Effort: Pulmonary effort is normal. No respiratory distress.     Breath sounds: No stridor. Wheezing, rhonchi (few faint rhonchi vs rales at bases. faint end expiratory wheeze on upper left. ) and rales present.  Musculoskeletal:     Right lower leg: No edema.     Left lower leg: No edema.  Skin:    General: Skin is warm and dry.  Neurological:     Mental Status: He is alert and oriented to person, place, and time.   DG Chest 2 View  Result Date: 04/06/2020 CLINICAL DATA:  Cough and wheezing EXAM: CHEST - 2 VIEW COMPARISON:  Chest radiograph 04/04/2019, 01/27/2019 FINDINGS: Normal mediastinum and cardiac silhouette. Chronic bronchitic markings noted. No effusion, infiltrate pneumothorax. No suspicious nodularity. Degenerative osteophytosis of the spine. IMPRESSION: 1. No acute cardiopulmonary findings. 2. Chronic bronchitic markings. Electronically Signed   By: Suzy Bouchard M.D.   On: 04/06/2020 11:02    EKG: sinus rhythm, sinus bradycardia at 55.  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  No apparent changes when compared to 04/04/2019 EKG.  52 minutes spent during visit, greater than 50% counseling and assimilation of information, chart review, and discussion of plan.   DG Chest 2 View  Result Date: 04/06/2020 CLINICAL DATA:  Cough and wheezing EXAM: CHEST - 2 VIEW COMPARISON:  Chest radiograph 04/04/2019, 01/27/2019 FINDINGS: Normal mediastinum and cardiac silhouette. Chronic bronchitic markings noted. No effusion, infiltrate  pneumothorax. No suspicious nodularity. Degenerative osteophytosis of the spine. IMPRESSION: 1. No acute cardiopulmonary findings. 2. Chronic  bronchitic markings. Electronically Signed   By: Suzy Bouchard M.D.   On: 04/06/2020 11:02     Assessment & Plan:  Williamson [place]ah. is a 79 y.o. male . Type 2 diabetes mellitus without complication, without long-term current use of insulin (HCC) - Plan: Hemoglobin A1c  -Check A1c, no new meds for now.  Essential hypertension - Plan: losartan-hydrochlorothiazide (HYZAAR) 50-12.5 MG tablet, Comprehensive metabolic panel, EKG [id number]  -Slight elevation.  No apparent concerning findings or acute changes on EKG.  Decreasing alcohol would be the initial recommendation, no change in meds for now.  Hyperlipidemia, unspecified hyperlipidemia type - Plan: Comprehensive metabolic panel, Lipid panel  -Tolerating Lipitor, continue same.  Wheezing - Plan: albuterol (VENTOLIN HFA) 108 (90 Base) MCG/ACT inhaler, DG Chest 2 View, EKG 12-Lead DOE (dyspnea on exertion) - Plan: albuterol (VENTOLIN HFA) 108 (90 Base) MCG/ACT inhaler, Pro b natriuretic peptide, EKG 12-Lead  -Possible COPD/chronic bronchitis cause., x-ray without acute findings.  Check BNP but less likely heart failure.  Albuterol as needed, start Flovent daily, recheck 2 weeks.  Alcohol abuse  -Decreasing intake discussed with resources to be provided if needed or difficulty cutting back.  Smoking cessation discussed.  Meds ordered this encounter  Medications  . albuterol (VENTOLIN HFA) 108 (90 Base) MCG/ACT inhaler    Sig: Inhale 2 puffs into the lungs every 6 (six) hours as needed for wheezing or shortness of breath.    Dispense:  18 g    Refill:  0  . losartan-hydrochlorothiazide (HYZAAR) 50-12.5 MG tablet    Sig: Take 1 tablet by mouth daily.    Dispense:  90 tablet    Refill:  1  . fluticasone (FLOVENT HFA) 110 MCG/ACT inhaler    Sig: Inhale 1 puff into the lungs in the morning and at bedtime.    Dispense:  1 each    Refill:  3   Patient Instructions    Decrease alcohol in half initially. If you have difficulty  cutting back, let me know. That should help with blood pressure as well - no med changes.   High dose flu vaccine will be here spetember 30th.   Start Flovent 1 puff twice per day every day for wheezing.  I will check a heart failure test but that is less likely.  Continue albuterol if needed for breakthrough wheezing.  Let me know if we can help you quit smoking.  That is very important to help with wheezing and lung symptoms.  Recheck in the next 2 weeks, sooner if worse.  Return to the clinic or go to the nearest emergency room if any of your symptoms worsen or new symptoms occur.   If you have lab work done today you will be contacted with your lab results within the next 2 weeks.  If you have not heard from [place] then please contact us. The fastest way to get your results is to register for My Chart.   IF you received an x-ray today, you will receive an invoice from [facility]. Please contact [facility] at [phone] with questions or concerns regarding your invoice.   IF you received labwork today, you will receive an invoice from [place]. Please contact [facility] at [phone] with questions or concerns regarding your invoice.   Our billing staff will not be able to assist you with questions regarding bills from these companies.  You will  be contacted with the lab results as soon as they are available. The fastest way to get your results is to activate your My Chart account. Instructions are located on the last page of this paperwork. If you have not heard from [place] regarding the results in 2 weeks, please contact this office.         Signed, [name], MD [facility]

## 2020-04-07 LAB — PRO B NATRIURETIC PEPTIDE: NT-Pro BNP: 135 pg/mL (ref 0–486)

## 2020-04-07 LAB — COMPREHENSIVE METABOLIC PANEL
ALT: 14 IU/L (ref 0–44)
AST: 16 IU/L (ref 0–40)
Albumin/Globulin Ratio: 1.5 (ref 1.2–2.2)
Albumin: 4.4 g/dL (ref 3.7–4.7)
Alkaline Phosphatase: 64 IU/L (ref 48–121)
BUN/Creatinine Ratio: 12 (ref 10–24)
BUN: 12 mg/dL (ref 8–27)
Bilirubin Total: 0.5 mg/dL (ref 0.0–1.2)
CO2: 25 mmol/L (ref 20–29)
Calcium: 9.8 mg/dL (ref 8.6–10.2)
Chloride: 98 mmol/L (ref 96–106)
Creatinine, Ser: 1.04 mg/dL (ref 0.76–1.27)
GFR calc Af Amer: 80 mL/min/{1.73_m2} (ref >59)
GFR calc non Af Amer: 69 mL/min/{1.73_m2} (ref >59)
Globulin, Total: 2.9 g/dL (ref 1.5–4.5)
Glucose: 109 mg/dL — ABNORMAL HIGH (ref 65–99)
Potassium: 4.8 mmol/L (ref 3.5–5.2)
Sodium: 137 mmol/L (ref 134–144)
Total Protein: 7.3 g/dL (ref 6.0–8.5)

## 2020-04-07 LAB — HEMOGLOBIN A1C
Est. average glucose Bld gHb Est-mCnc: 160 mg/dL
Hgb A1c MFr Bld: 7.2 % — ABNORMAL HIGH (ref 4.8–5.6)

## 2020-04-07 LAB — LIPID PANEL
Chol/HDL Ratio: 3 ratio (ref 0.0–5.0)
Cholesterol, Total: 144 mg/dL (ref 100–199)
HDL: 48 mg/dL (ref >39)
LDL Chol Calc (NIH): 77 mg/dL (ref 0–99)
Triglycerides: 105 mg/dL (ref 0–149)
VLDL Cholesterol Cal: 19 mg/dL (ref 5–40)

## 2020-04-27 ENCOUNTER — Ambulatory Visit: Payer: Medicare HMO | Admitting: Family Medicine

## 2020-05-04 ENCOUNTER — Ambulatory Visit (INDEPENDENT_AMBULATORY_CARE_PROVIDER_SITE_OTHER): Payer: Medicare HMO | Admitting: Family Medicine

## 2020-05-04 ENCOUNTER — Other Ambulatory Visit: Payer: Self-pay

## 2020-05-04 ENCOUNTER — Encounter: Payer: Self-pay | Admitting: Family Medicine

## 2020-05-04 VITALS — BP 139/70 | HR 72 | Temp 98.2°F | Ht 69.0 in | Wt 190.0 lb

## 2020-05-04 DIAGNOSIS — R0609 Other forms of dyspnea: Secondary | ICD-10-CM

## 2020-05-04 DIAGNOSIS — Z23 Encounter for immunization: Secondary | ICD-10-CM | POA: Diagnosis not present

## 2020-05-04 DIAGNOSIS — R062 Wheezing: Secondary | ICD-10-CM

## 2020-05-04 DIAGNOSIS — R0600 Dyspnea, unspecified: Secondary | ICD-10-CM | POA: Diagnosis not present

## 2020-05-04 MED ORDER — ALBUTEROL SULFATE HFA 108 (90 BASE) MCG/ACT IN AERS: 2.0000 | INHALATION_SPRAY | Freq: Four times a day (QID) | RESPIRATORY_TRACT | 0 refills | Status: DC | PRN

## 2020-05-04 MED ORDER — FLOVENT HFA 110 MCG/ACT IN AERO: 1.0000 | INHALATION_SPRAY | Freq: Two times a day (BID) | RESPIRATORY_TRACT | 3 refills | Status: DC

## 2020-05-04 NOTE — Progress Notes (Signed)
Subjective:  Patient ID: Phillip Maynard., male    DOB: [birth year]  Age: 79 y.o. MRN: [id number]  CC:  Chief Complaint  Patient presents with   Follow-up    on wheezing and SOB. PT reports the treatment is work well. PT reports is sleeping well not being awaken by SOB. pt reports his weezing has improved alot.     HPI Phillip Maynard. presents for   Dyspnea/wheezing. Discussed at August 23 visit.  Possible COPD/chronic bronchitis component.  X-ray without acute findings.  BNP was normal at 135.  Started albuterol as needed, Flovent 110 mcg twice daily.  Smoking cessation discussed. CXR 8/23: FINDINGS: Normal mediastinum and cardiac silhouette. Chronic bronchitic markings noted. No effusion, infiltrate pneumothorax. No suspicious nodularity. Degenerative osteophytosis of the spine.  IMPRESSION: 1. No acute cardiopulmonary findings. 2. Chronic bronchitic markings.  Feels better since last visit. Not feeling tight at night. Less wheezing. Using flovent bid, only needing albuterol once or twice per week.   No chest pains or leg swelling.  Flu shot today.  Tobacco - 1 pack per 3 days. Still trying to cut back.        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 No longer on medicine   Past Surgical History:  Procedure Laterality Date    COLONOSCOPY  last 09/06/2016   CYSTOSCOPY N/A 09/08/2016   Procedure: FIREFLY INJECTIONS;  Surgeon: Cleon Gustin, MD;  Location: [facility];  Service: Urology;  Laterality: N/A;   INCISION AND DRAINAGE OF WOUND Right 1970's   "leg" (04/08/2013)   LIVER BIOPSY  ~ 2011   POLYPECTOMY     PROCTOSCOPY N/A 09/08/2016   Procedure: RIGID PROCTOSCOPY;  Surgeon: Michael Boston, MD;  Location: [facility];  Service: General;  Laterality: N/A;   TONSILLECTOMY     Allergies  Allergen Reactions   Penicillins Anaphylaxis and Other (See Comments)    Has patient had a PCN reaction causing immediate rash, facial/tongue/throat swelling, SOB or lightheadedness with hypotension: Yes Has patient had a PCN reaction causing severe rash involving mucus membranes or skin necrosis: No Has patient had a PCN reaction that required hospitalization No Has patient had a PCN reaction occurring within the last 10 years: No If all of the above answers are "NO", then may proceed with Cephalosporin use.   Shellfish Allergy Anaphylaxis   Prior to Admission medications   Medication Sig Start Date End Date Taking? Authorizing Provider  albuterol (VENTOLIN HFA) 108 (90 Base) MCG/ACT inhaler Inhale 2 puffs into the lungs every 6 (six) hours as needed for wheezing or shortness of breath. 04/06/20  Yes Wendie Agreste, MD  atorvastatin (LIPITOR) 10 MG tablet TAKE 1 TABLET EVERY DAY 01/06/20  Yes Wendie Agreste, MD  finasteride (PROSCAR) 5  MG tablet Take 5 mg by mouth daily. 09/22/17  Yes [provider]  fluticasone (FLOVENT HFA) 110 MCG/ACT inhaler Inhale 1 puff into the lungs in the morning and at bedtime. 04/06/20  Yes Wendie Agreste, MD  losartan-hydrochlorothiazide (HYZAAR) 50-12.5 MG tablet Take 1 tablet by mouth daily. 04/06/20  Yes Wendie Agreste, MD  tamsulosin (FLOMAX) 0.4 MG CAPS capsule Take 0.4 mg by mouth at bedtime.    Yes [provider]   Social History   Socioeconomic History   Marital  status: Married    Spouse name: Not on file   Number of children: 2   Years of education: Not on file   Highest education level: Not on file  Occupational History   Occupation: truck driver    Employer: [employer]    Comment: retired  Tobacco Use   Smoking status: Current Every Day Smoker    Packs/day: 0.25    Years: 45.00    Pack years: 11.25    Types: Cigarettes   Smokeless tobacco: Never Used  Scientific laboratory technician Use: Never used  Substance and Sexual Activity   Alcohol use: Yes    Alcohol/week: 8.0 standard drinks    Types: 8 Cans of beer per week   Drug use: No   Sexual activity: Yes  Other Topics Concern   Not on file  Social History Narrative   Pt lives in 1 story home with his wife   Has 2 adult children   Highest level of education: GED & Trade school   Retired Media planner.    Social Determinants of Health   Financial Resource Strain:    Difficulty of Paying Living Expenses: Not on file  Food Insecurity:    Worried About Charity fundraiser in the Last Year: Not on file   [employer] of Food in the Last Year: Not on file  Transportation Needs:    Lack of Transportation (Medical): Not on file   Lack of Transportation (Non-Medical): Not on file  Physical Activity:    Days of Exercise per Week: Not on file   Minutes of Exercise per Session: Not on file  Stress:    Feeling of Stress : Not on file  Social Connections:    Frequency of Communication with Friends and Family: Not on file   Frequency of Social Gatherings with Friends and Family: Not on file   Attends Religious Services: Not on file   Active Member of Clubs or Organizations: Not on file   Attends Archivist Meetings: Not on file   Marital Status: Not on file  Intimate Partner Violence:    Fear of Current or Ex-Partner: Not on file   Emotionally Abused: Not on file   Physically Abused: Not on file   Sexually Abused: Not on file    Review of  Systems   Objective:   Vitals:   05/04/20 1153  BP: 139/70  Pulse: 72  Temp: 98.2 F (36.8 C)  TempSrc: Temporal  SpO2: 96%  Weight: 190 lb (86.2 kg)  Height: 5\' 9"  (1.753 m)     Physical Exam Vitals reviewed.  Constitutional:      Appearance: He is well-developed.  HENT:     Head: Normocephalic and atraumatic.  Eyes:     Pupils: Pupils are equal, round, and reactive to light.  Neck:     Vascular: No carotid bruit or JVD.  Cardiovascular:     Rate and Rhythm:  Normal rate and regular rhythm.     Heart sounds: Normal heart sounds. No murmur heard.  No friction rub.  Pulmonary:     Effort: Pulmonary effort is normal. No respiratory distress.     Breath sounds: Normal breath sounds. No wheezing or rales.  Skin:    General: Skin is warm and dry.  Neurological:     Mental Status: He is alert and oriented to person, place, and time.     Assessment & Plan:  Shafiq Larch. is a 79 y.o. male . Wheezing - Plan: albuterol (VENTOLIN HFA) 108 (90 Base) MCG/ACT inhaler, fluticasone (FLOVENT HFA) 110 MCG/ACT inhaler DOE (dyspnea on exertion) - Plan: albuterol (VENTOLIN HFA) 108 (90 Base) MCG/ACT inhaler, fluticasone (FLOVENT HFA) 110 MCG/ACT inhaler  -Improved on Flovent, suspect component of chronic bronchitis, continue same.  Albuterol if needed but rare use at this time.  Continued recommendations on cutting back/quitting smoking.  Handout given.  RTC precautions.  Need for prophylactic vaccination and inoculation against influenza - Plan: Flu Vaccine QUAD High Dose(Fluad)   Meds ordered this encounter  Medications   albuterol (VENTOLIN HFA) 108 (90 Base) MCG/ACT inhaler    Sig: Inhale 2 puffs into the lungs every 6 (six) hours as needed for wheezing or shortness of breath.    Dispense:  18 g    Refill:  0   fluticasone (FLOVENT HFA) 110 MCG/ACT inhaler    Sig: Inhale 1 puff into the lungs in the morning and at bedtime.    Dispense:  1 each    Refill:  3    Patient Instructions      Coping with Quitting Smoking  Quitting smoking is a physical and mental challenge. You will face cravings, withdrawal symptoms, and temptation. Before quitting, work with your health care provider to make a plan that can help you cope. Preparation can help you quit and keep you from giving in. How can I cope with cravings? Cravings usually last for 5-10 minutes. If you get through it, the craving will pass. Consider taking the following actions to help you cope with cravings:  Keep your mouth busy: ? Chew sugar-free gum. ? Suck on hard candies or a straw. ? Brush your teeth.  Keep your hands and body busy: ? Immediately change to a different activity when you feel a craving. ? Squeeze or play with a ball. ? Do an activity or a hobby, like making bead jewelry, practicing needlepoint, or working with wood. ? Mix up your normal routine. ? Take a short exercise break. Go for a quick walk or run up and down stairs. ? Spend time in public places where smoking is not allowed.  Focus on doing something kind or helpful for someone else.  Call a friend or family member to talk during a craving.  Join a support group.  Call a quit line, such as 1-800-QUIT-NOW.  Talk with your health care provider about medicines that might help you cope with cravings and make quitting easier for you. How can I deal with withdrawal symptoms? Your body may experience negative effects as it tries to get used to not having nicotine in the system. These effects are called withdrawal symptoms. They may include:  Feeling hungrier than normal.  Trouble concentrating.  Irritability.  Trouble sleeping.  Feeling depressed.  Restlessness and agitation.  Craving a cigarette. To manage withdrawal symptoms:  Avoid places, people, and activities that trigger your cravings.  Remember why you want to quit.  Get plenty of sleep.  Avoid coffee and other caffeinated drinks.  These may worsen some of your symptoms. How can I handle social situations? Social situations can be difficult when you are quitting smoking, especially in the first few weeks. To manage this, you can:  Avoid parties, bars, and other social situations where people might be smoking.  Avoid alcohol.  Leave right away if you have the urge to smoke.  Explain to your family and friends that you are quitting smoking. Ask for understanding and support.  Plan activities with friends or family where smoking is not an option. What are some ways I can cope with stress? Wanting to smoke may cause stress, and stress can make you want to smoke. Find ways to manage your stress. Relaxation techniques can help. For example:  Breathe slowly and deeply, in through your nose and out through your mouth.  Listen to soothing, relaxing music.  Talk with a family member or friend about your stress.  Light a candle.  Soak in a bath or take a shower.  Think about a peaceful place. What are some ways I can prevent weight gain? Be aware that many people gain weight after they quit smoking. However, not everyone does. To keep from gaining weight, have a plan in place before you quit and stick to the plan after you quit. Your plan should include:  Having healthy snacks. When you have a craving, it may help to: ? Eat plain popcorn, crunchy carrots, celery, or other cut vegetables. ? Chew sugar-free gum.  Changing how you eat: ? Eat small portion sizes at meals. ? Eat 4-6 small meals throughout the day instead of 1-2 large meals a day. ? Be mindful when you eat. Do not watch television or do other things that might distract you as you eat.  Exercising regularly: ? Make time to exercise each day. If you do not have time for a long workout, do short bouts of exercise for 5-10 minutes several times a day. ? Do some form of strengthening exercise, like weight lifting, and some form of aerobic exercise, like  running or swimming.  Drinking plenty of water or other low-calorie or no-calorie drinks. Drink 6-8 glasses of water daily, or as much as instructed by your health care provider. Summary  Quitting smoking is a physical and mental challenge. You will face cravings, withdrawal symptoms, and temptation to smoke again. Preparation can help you as you go through these challenges.  You can cope with cravings by keeping your mouth busy (such as by chewing gum), keeping your body and hands busy, and making calls to family, friends, or a helpline for people who want to quit smoking.  You can cope with withdrawal symptoms by avoiding places where people smoke, avoiding drinks with caffeine, and getting plenty of rest.  Ask your health care provider about the different ways to prevent weight gain, avoid stress, and handle social situations. This information is not intended to replace advice given to you by your health care provider. Make sure you discuss any questions you have with your health care provider. Document Revised: 07/14/2017 Document Reviewed: 07/29/2016 Elsevier Patient Education  2020 [employer].  Steps to Quit Smoking Smoking tobacco is the leading cause of preventable death. It can affect almost every organ in the body. Smoking puts you and those around you at risk for developing many serious chronic diseases. Quitting smoking can be difficult, but it is one of the best things that you can  do for your health. It is never too late to quit. How do I get ready to quit? When you decide to quit smoking, create a plan to help you succeed. Before you quit:  Pick a date to quit. Set a date within the next 2 weeks to give you time to prepare.  Write down the reasons why you are quitting. Keep this list in places where you will see it often.  Tell your family, friends, and co-workers that you are quitting. Support from your loved ones can make quitting easier.  Talk with your health care  provider about your options for quitting smoking.  Find out what treatment options are covered by your health insurance.  Identify people, places, things, and activities that make you want to smoke (triggers). Avoid them. What first steps can I take to quit smoking?  Throw away all cigarettes at home, at work, and in your car.  Throw away smoking accessories, such as Scientist, research (medical).  Clean your car. Make sure to empty the ashtray.  Clean your home, including curtains and carpets. What strategies can I use to quit smoking? Talk with your health care provider about combining strategies, such as taking medicines while you are also receiving in-person counseling. Using these two strategies together makes you more likely to succeed in quitting than if you used either strategy on its own.  If you are pregnant or breastfeeding, talk with your health care provider about finding counseling or other support strategies to quit smoking. Do not take medicine to help you quit smoking unless your health care provider tells you to do so. To quit smoking: Quit right away  Quit smoking completely, instead of gradually reducing how much you smoke over a period of time. Research shows that stopping smoking right away is more successful than gradually quitting.  Attend in-person counseling to help you build problem-solving skills. You are more likely to succeed in quitting if you attend counseling sessions regularly. Even short sessions of 10 minutes can be effective. Take medicine You may take medicines to help you quit smoking. Some medicines require a prescription and some you can purchase over-the-counter. Medicines may have nicotine in them to replace the nicotine in cigarettes. Medicines may:  Help to stop cravings.  Help to relieve withdrawal symptoms. Your health care provider may recommend:  Nicotine patches, gum, or lozenges.  Nicotine inhalers or sprays.  Non-nicotine medicine that  is taken by mouth. Find resources Find resources and support systems that can help you to quit smoking and remain smoke-free after you quit. These resources are most helpful when you use them often. They include:  Online chats with a Social worker.  Telephone quitlines.  Printed Furniture conservator/restorer.  Support groups or group counseling.  Text messaging programs.  Mobile phone apps or applications. Use apps that can help you stick to your quit plan by providing reminders, tips, and encouragement. There are many free apps for mobile devices as well as websites. Examples include Quit Guide from the [employer] and smokefree.gov What things can I do to make it easier to quit?   Reach out to your family and friends for support and encouragement. Call telephone quitlines (1-800-QUIT-NOW), reach out to support groups, or work with a counselor for support.  Ask people who smoke to avoid smoking around you.  Avoid places that trigger you to smoke, such as bars, parties, or smoke-break areas at work.  Spend time with people who do not smoke.  Lessen the stress in  your life. Stress can be a smoking trigger for some people. To lessen stress, try: ? Exercising regularly. ? Doing deep-breathing exercises. ? Doing yoga. ? Meditating. ? Performing a body scan. This involves closing your eyes, scanning your body from head to toe, and noticing which parts of your body are particularly tense. Try to relax the muscles in those areas. How will I feel when I quit smoking? Day 1 to 3 weeks Within the first 24 hours of quitting smoking, you may start to feel withdrawal symptoms. These symptoms are usually most noticeable 2-3 days after quitting, but they usually do not last for more than 2-3 weeks. You may experience these symptoms:  Mood swings.  Restlessness, anxiety, or irritability.  Trouble concentrating.  Dizziness.  Strong cravings for sugary foods and nicotine.  Mild weight  gain.  Constipation.  Nausea.  Coughing or a sore throat.  Changes in how the medicines that you take for unrelated issues work in your body.  Depression.  Trouble sleeping (insomnia). Week 3 and afterward After the first 2-3 weeks of quitting, you may start to notice more positive results, such as:  Improved sense of smell and taste.  Decreased coughing and sore throat.  Slower heart rate.  Lower blood pressure.  Clearer skin.  The ability to breathe more easily.  Fewer sick days. Quitting smoking can be very challenging. Do not get discouraged if you are not successful the first time. Some people need to make many attempts to quit before they achieve long-term success. Do your best to stick to your quit plan, and talk with your health care provider if you have any questions or concerns. Summary  Smoking tobacco is the leading cause of preventable death. Quitting smoking is one of the best things that you can do for your health.  When you decide to quit smoking, create a plan to help you succeed.  Quit smoking right away, not slowly over a period of time.  When you start quitting, seek help from your health care provider, family, or friends. This information is not intended to replace advice given to you by your health care provider. Make sure you discuss any questions you have with your health care provider. Document Revised: 04/26/2019 Document Reviewed: 10/20/2018 Elsevier Patient Education  [employer].   If you have lab work done today you will be contacted with your lab results within the next 2 weeks.  If you have not heard from [place] then please contact us. The fastest way to get your results is to register for My Chart.   IF you received an x-ray today, you will receive an invoice from [facility]. Please contact [facility] at [phone] with questions or concerns regarding your invoice.   IF you received labwork today, you will  receive an invoice from [facility]. Please contact [facility] at [phone] with questions or concerns regarding your invoice.   Our billing staff will not be able to assist you with questions regarding bills from these companies.  You will be contacted with the lab results as soon as they are available. The fastest way to get your results is to activate your My Chart account. Instructions are located on the last page of this paperwork. If you have not heard from [place] regarding the results in 2 weeks, please contact this office.         Signed, [name], MD [facility]

## 2020-05-04 NOTE — Patient Instructions (Addendum)
Coping with Quitting Smoking  Quitting smoking is a physical and mental challenge. You will face cravings, withdrawal symptoms, and temptation. Before quitting, work with your health care provider to make a plan that can help you cope. Preparation can help you quit and keep you from giving in. How can I cope with cravings? Cravings usually last for 5-10 minutes. If you get through it, the craving will pass. Consider taking the following actions to help you cope with cravings:  Keep your mouth busy: ? Chew sugar-free gum. ? Suck on hard candies or a straw. ? Brush your teeth.  Keep your hands and body busy: ? Immediately change to a different activity when you feel a craving. ? Squeeze or play with a ball. ? Do an activity or a hobby, like making bead jewelry, practicing needlepoint, or working with wood. ? Mix up your normal routine. ? Take a short exercise break. Go for a quick walk or run up and down stairs. ? Spend time in public places where smoking is not allowed.  Focus on doing something kind or helpful for someone else.  Call a friend or family member to talk during a craving.  Join a support group.  Call a quit line, such as 1-800-QUIT-NOW.  Talk with your health care provider about medicines that might help you cope with cravings and make quitting easier for you. How can I deal with withdrawal symptoms? Your body may experience negative effects as it tries to get used to not having nicotine in the system. These effects are called withdrawal symptoms. They may include:  Feeling hungrier than normal.  Trouble concentrating.  Irritability.  Trouble sleeping.  Feeling depressed.  Restlessness and agitation.  Craving a cigarette. To manage withdrawal symptoms:  Avoid places, people, and activities that trigger your cravings.  Remember why you want to quit.  Get plenty of sleep.  Avoid coffee and other caffeinated drinks. These may worsen some of your  symptoms. How can I handle social situations? Social situations can be difficult when you are quitting smoking, especially in the first few weeks. To manage this, you can:  Avoid parties, bars, and other social situations where people might be smoking.  Avoid alcohol.  Leave right away if you have the urge to smoke.  Explain to your family and friends that you are quitting smoking. Ask for understanding and support.  Plan activities with friends or family where smoking is not an option. What are some ways I can cope with stress? Wanting to smoke may cause stress, and stress can make you want to smoke. Find ways to manage your stress. Relaxation techniques can help. For example:  Breathe slowly and deeply, in through your nose and out through your mouth.  Listen to soothing, relaxing music.  Talk with a family member or friend about your stress.  Light a candle.  Soak in a bath or take a shower.  Think about a peaceful place. What are some ways I can prevent weight gain? Be aware that many people gain weight after they quit smoking. However, not everyone does. To keep from gaining weight, have a plan in place before you quit and stick to the plan after you quit. Your plan should include:  Having healthy snacks. When you have a craving, it may help to: ? Eat plain popcorn, crunchy carrots, celery, or other cut vegetables. ? Chew sugar-free gum.  Changing how you eat: ? Eat small portion sizes at meals. ? Eat 4-6  small meals throughout the day instead of 1-2 large meals a day. ? Be mindful when you eat. Do not watch television or do other things that might distract you as you eat.  Exercising regularly: ? Make time to exercise each day. If you do not have time for a long workout, do short bouts of exercise for 5-10 minutes several times a day. ? Do some form of strengthening exercise, like weight lifting, and some form of aerobic exercise, like running or swimming.  Drinking  plenty of water or other low-calorie or no-calorie drinks. Drink 6-8 glasses of water daily, or as much as instructed by your health care provider. Summary  Quitting smoking is a physical and mental challenge. You will face cravings, withdrawal symptoms, and temptation to smoke again. Preparation can help you as you go through these challenges.  You can cope with cravings by keeping your mouth busy (such as by chewing gum), keeping your body and hands busy, and making calls to family, friends, or a helpline for people who want to quit smoking.  You can cope with withdrawal symptoms by avoiding places where people smoke, avoiding drinks with caffeine, and getting plenty of rest.  Ask your health care provider about the different ways to prevent weight gain, avoid stress, and handle social situations. This information is not intended to replace advice given to you by your health care provider. Make sure you discuss any questions you have with your health care provider. Document Revised: 07/14/2017 Document Reviewed: 07/29/2016 Elsevier Patient Education  2020 [employer].  Steps to Quit Smoking Smoking tobacco is the leading cause of preventable death. It can affect almost every organ in the body. Smoking puts you and those around you at risk for developing many serious chronic diseases. Quitting smoking can be difficult, but it is one of the best things that you can do for your health. It is never too late to quit. How do I get ready to quit? When you decide to quit smoking, create a plan to help you succeed. Before you quit:  Pick a date to quit. Set a date within the next 2 weeks to give you time to prepare.  Write down the reasons why you are quitting. Keep this list in places where you will see it often.  Tell your family, friends, and co-workers that you are quitting. Support from your loved ones can make quitting easier.  Talk with your health care provider about your options for  quitting smoking.  Find out what treatment options are covered by your health insurance.  Identify people, places, things, and activities that make you want to smoke (triggers). Avoid them. What first steps can I take to quit smoking?  Throw away all cigarettes at home, at work, and in your car.  Throw away smoking accessories, such as Scientist, research (medical).  Clean your car. Make sure to empty the ashtray.  Clean your home, including curtains and carpets. What strategies can I use to quit smoking? Talk with your health care provider about combining strategies, such as taking medicines while you are also receiving in-person counseling. Using these two strategies together makes you more likely to succeed in quitting than if you used either strategy on its own.  If you are pregnant or breastfeeding, talk with your health care provider about finding counseling or other support strategies to quit smoking. Do not take medicine to help you quit smoking unless your health care provider tells you to do so. To quit  smoking: Quit right away  Quit smoking completely, instead of gradually reducing how much you smoke over a period of time. Research shows that stopping smoking right away is more successful than gradually quitting.  Attend in-person counseling to help you build problem-solving skills. You are more likely to succeed in quitting if you attend counseling sessions regularly. Even short sessions of 10 minutes can be effective. Take medicine You may take medicines to help you quit smoking. Some medicines require a prescription and some you can purchase over-the-counter. Medicines may have nicotine in them to replace the nicotine in cigarettes. Medicines may:  Help to stop cravings.  Help to relieve withdrawal symptoms. Your health care provider may recommend:  Nicotine patches, gum, or lozenges.  Nicotine inhalers or sprays.  Non-nicotine medicine that is taken by mouth. Find  resources Find resources and support systems that can help you to quit smoking and remain smoke-free after you quit. These resources are most helpful when you use them often. They include:  Online chats with a Social worker.  Telephone quitlines.  Printed Furniture conservator/restorer.  Support groups or group counseling.  Text messaging programs.  Mobile phone apps or applications. Use apps that can help you stick to your quit plan by providing reminders, tips, and encouragement. There are many free apps for mobile devices as well as websites. Examples include Quit Guide from the [employer] and smokefree.gov What things can I do to make it easier to quit?   Reach out to your family and friends for support and encouragement. Call telephone quitlines (1-800-QUIT-NOW), reach out to support groups, or work with a counselor for support.  Ask people who smoke to avoid smoking around you.  Avoid places that trigger you to smoke, such as bars, parties, or smoke-break areas at work.  Spend time with people who do not smoke.  Lessen the stress in your life. Stress can be a smoking trigger for some people. To lessen stress, try: ? Exercising regularly. ? Doing deep-breathing exercises. ? Doing yoga. ? Meditating. ? Performing a body scan. This involves closing your eyes, scanning your body from head to toe, and noticing which parts of your body are particularly tense. Try to relax the muscles in those areas. How will I feel when I quit smoking? Day 1 to 3 weeks Within the first 24 hours of quitting smoking, you may start to feel withdrawal symptoms. These symptoms are usually most noticeable 2-3 days after quitting, but they usually do not last for more than 2-3 weeks. You may experience these symptoms:  Mood swings.  Restlessness, anxiety, or irritability.  Trouble concentrating.  Dizziness.  Strong cravings for sugary foods and nicotine.  Mild weight gain.  Constipation.  Nausea.  Coughing or a  sore throat.  Changes in how the medicines that you take for unrelated issues work in your body.  Depression.  Trouble sleeping (insomnia). Week 3 and afterward After the first 2-3 weeks of quitting, you may start to notice more positive results, such as:  Improved sense of smell and taste.  Decreased coughing and sore throat.  Slower heart rate.  Lower blood pressure.  Clearer skin.  The ability to breathe more easily.  Fewer sick days. Quitting smoking can be very challenging. Do not get discouraged if you are not successful the first time. Some people need to make many attempts to quit before they achieve long-term success. Do your best to stick to your quit plan, and talk with your health care provider if you  have any questions or concerns. Summary  Smoking tobacco is the leading cause of preventable death. Quitting smoking is one of the best things that you can do for your health.  When you decide to quit smoking, create a plan to help you succeed.  Quit smoking right away, not slowly over a period of time.  When you start quitting, seek help from your health care provider, family, or friends. This information is not intended to replace advice given to you by your health care provider. Make sure you discuss any questions you have with your health care provider. Document Revised: 04/26/2019 Document Reviewed: 10/20/2018 Elsevier Patient Education  [employer].   If you have lab work done today you will be contacted with your lab results within the next 2 weeks.  If you have not heard from [place] then please contact us. The fastest way to get your results is to register for My Chart.   IF you received an x-ray today, you will receive an invoice from [facility]. Please contact [facility] at [phone] with questions or concerns regarding your invoice.   IF you received labwork today, you will receive an invoice from [facility]. Please contact [facility]  at [phone] with questions or concerns regarding your invoice.   Our billing staff will not be able to assist you with questions regarding bills from these companies.  You will be contacted with the lab results as soon as they are available. The fastest way to get your results is to activate your My Chart account. Instructions are located on the last page of this paperwork. If you have not heard from [place] regarding the results in 2 weeks, please contact this office.

## 2020-05-05 ENCOUNTER — Encounter: Payer: Self-pay | Admitting: Family Medicine

## 2020-05-22 DIAGNOSIS — H2512 Age-related nuclear cataract, left eye: Secondary | ICD-10-CM | POA: Diagnosis not present

## 2020-05-22 DIAGNOSIS — H2513 Age-related nuclear cataract, bilateral: Secondary | ICD-10-CM | POA: Diagnosis not present

## 2020-05-22 DIAGNOSIS — H18413 Arcus senilis, bilateral: Secondary | ICD-10-CM | POA: Diagnosis not present

## 2020-05-22 DIAGNOSIS — H25043 Posterior subcapsular polar age-related cataract, bilateral: Secondary | ICD-10-CM | POA: Diagnosis not present

## 2020-05-22 DIAGNOSIS — H25013 Cortical age-related cataract, bilateral: Secondary | ICD-10-CM | POA: Diagnosis not present

## 2020-07-06 ENCOUNTER — Other Ambulatory Visit: Payer: Self-pay

## 2020-07-06 ENCOUNTER — Encounter: Payer: Self-pay | Admitting: Family Medicine

## 2020-07-06 ENCOUNTER — Ambulatory Visit (INDEPENDENT_AMBULATORY_CARE_PROVIDER_SITE_OTHER): Payer: Medicare HMO | Admitting: Family Medicine

## 2020-07-06 VITALS — BP 140/70 | HR 59 | Temp 97.6°F | Ht 69.0 in | Wt 185.0 lb

## 2020-07-06 DIAGNOSIS — R0600 Dyspnea, unspecified: Secondary | ICD-10-CM

## 2020-07-06 DIAGNOSIS — F17210 Nicotine dependence, cigarettes, uncomplicated: Secondary | ICD-10-CM

## 2020-07-06 DIAGNOSIS — R062 Wheezing: Secondary | ICD-10-CM | POA: Diagnosis not present

## 2020-07-06 DIAGNOSIS — E1165 Type 2 diabetes mellitus with hyperglycemia: Secondary | ICD-10-CM

## 2020-07-06 DIAGNOSIS — R0609 Other forms of dyspnea: Secondary | ICD-10-CM

## 2020-07-06 MED ORDER — ALBUTEROL SULFATE HFA 108 (90 BASE) MCG/ACT IN AERS: 2.0000 | INHALATION_SPRAY | Freq: Four times a day (QID) | RESPIRATORY_TRACT | 0 refills | Status: DC | PRN

## 2020-07-06 MED ORDER — FLOVENT HFA 110 MCG/ACT IN AERO: 1.0000 | INHALATION_SPRAY | Freq: Two times a day (BID) | RESPIRATORY_TRACT | 5 refills | Status: DC

## 2020-07-06 NOTE — Patient Instructions (Addendum)
continue flovent, but can increase to 2 puffs twice per day if needed.  Continue to try quitting smoking. See information below. Let me know if I can help.   Coping with Quitting Smoking  Quitting smoking is a physical and mental challenge. You will face cravings, withdrawal symptoms, and temptation. Before quitting, work with your health care provider to make a plan that can help you cope. Preparation can help you quit and keep you from giving in. How can I cope with cravings? Cravings usually last for 5-10 minutes. If you get through it, the craving will pass. Consider taking the following actions to help you cope with cravings:  Keep your mouth busy: ? Chew sugar-free gum. ? Suck on hard candies or a straw. ? Brush your teeth.  Keep your hands and body busy: ? Immediately change to a different activity when you feel a craving. ? Squeeze or play with a ball. ? Do an activity or a hobby, like making bead jewelry, practicing needlepoint, or working with wood. ? Mix up your normal routine. ? Take a short exercise break. Go for a quick walk or run up and down stairs. ? Spend time in public places where smoking is not allowed.  Focus on doing something kind or helpful for someone else.  Call a friend or family member to talk during a craving.  Join a support group.  Call a quit line, such as 1-800-QUIT-NOW.  Talk with your health care provider about medicines that might help you cope with cravings and make quitting easier for you. How can I deal with withdrawal symptoms? Your body may experience negative effects as it tries to get used to not having nicotine in the system. These effects are called withdrawal symptoms. They may include:  Feeling hungrier than normal.  Trouble concentrating.  Irritability.  Trouble sleeping.  Feeling depressed.  Restlessness and agitation.  Craving a cigarette. To manage withdrawal symptoms:  Avoid places, people, and activities that  trigger your cravings.  Remember why you want to quit.  Get plenty of sleep.  Avoid coffee and other caffeinated drinks. These may worsen some of your symptoms. How can I handle social situations? Social situations can be difficult when you are quitting smoking, especially in the first few weeks. To manage this, you can:  Avoid parties, bars, and other social situations where people might be smoking.  Avoid alcohol.  Leave right away if you have the urge to smoke.  Explain to your family and friends that you are quitting smoking. Ask for understanding and support.  Plan activities with friends or family where smoking is not an option. What are some ways I can cope with stress? Wanting to smoke may cause stress, and stress can make you want to smoke. Find ways to manage your stress. Relaxation techniques can help. For example:  Breathe slowly and deeply, in through your nose and out through your mouth.  Listen to soothing, relaxing music.  Talk with a family member or friend about your stress.  Light a candle.  Soak in a bath or take a shower.  Think about a peaceful place. What are some ways I can prevent weight gain? Be aware that many people gain weight after they quit smoking. However, not everyone does. To keep from gaining weight, have a plan in place before you quit and stick to the plan after you quit. Your plan should include:  Having healthy snacks. When you have a craving, it may help to: ?  Eat plain popcorn, crunchy carrots, celery, or other cut vegetables. ? Chew sugar-free gum.  Changing how you eat: ? Eat small portion sizes at meals. ? Eat 4-6 small meals throughout the day instead of 1-2 large meals a day. ? Be mindful when you eat. Do not watch television or do other things that might distract you as you eat.  Exercising regularly: ? Make time to exercise each day. If you do not have time for a long workout, do short bouts of exercise for 5-10 minutes  several times a day. ? Do some form of strengthening exercise, like weight lifting, and some form of aerobic exercise, like running or swimming.  Drinking plenty of water or other low-calorie or no-calorie drinks. Drink 6-8 glasses of water daily, or as much as instructed by your health care provider. Summary  Quitting smoking is a physical and mental challenge. You will face cravings, withdrawal symptoms, and temptation to smoke again. Preparation can help you as you go through these challenges.  You can cope with cravings by keeping your mouth busy (such as by chewing gum), keeping your body and hands busy, and making calls to family, friends, or a helpline for people who want to quit smoking.  You can cope with withdrawal symptoms by avoiding places where people smoke, avoiding drinks with caffeine, and getting plenty of rest.  Ask your health care provider about the different ways to prevent weight gain, avoid stress, and handle social situations. This information is not intended to replace advice given to you by your health care provider. Make sure you discuss any questions you have with your health care provider. Document Revised: 07/14/2017 Document Reviewed: 07/29/2016 Elsevier Patient Education  [employer].    If you have lab work done today you will be contacted with your lab results within the next 2 weeks.  If you have not heard from [place] then please contact us. The fastest way to get your results is to register for My Chart.   IF you received an x-ray today, you will receive an invoice from [facility]. Please contact [facility] at [phone] with questions or concerns regarding your invoice.   IF you received labwork today, you will receive an invoice from [facility]. Please contact [facility] at [phone] with questions or concerns regarding your invoice.   Our billing staff will not be able to assist you with questions regarding bills from these  companies.  You will be contacted with the lab results as soon as they are available. The fastest way to get your results is to activate your My Chart account. Instructions are located on the last page of this paperwork. If you have not heard from [place] regarding the results in 2 weeks, please contact this office.

## 2020-07-06 NOTE — Progress Notes (Signed)
Subjective:  Patient ID: Phillip Maynard., male    DOB: 1941-04-22  Age: 79 y.o. MRN: [id number]  CC:  Chief Complaint  Patient presents with  . Follow-up    on wheezing and diabetes. PT reports his Wheezing has improved since last OV. pt has been using inhaler as prescribed. PT states he has been doing well to control his Diabes with diet.    HPI Phillip Maynard. presents for  Diabetes: Complicated by hyperglycemia slight increased A1c in August from 6.5-7.2.  Remained on diet control.  He is on statin and ARB. Microalbumin: Normal ratio September 2020 Optho, foot exam, pneumovax: Up-to-date No recent home readings.  Lab Results  Component Value Date   HGBA1C 7.2 (H) 04/06/2020   HGBA1C 6.5 (H) 07/30/2019   HGBA1C 6.9 (H) 05/06/2019   Lab Results  Component Value Date   MICROALBUR 11.5 09/23/2015   LDLCALC 77 04/06/2020   CREATININE 1.04 04/06/2020    Dyspnea/wheezing Discussed and August possible COPD/chronic bronchitis component.  Normal BNP.  Treated with Flovent and albuterol with improved symptoms at follow-up in September.  Albuterol once or twice a week at that time.  Had flu shot last visit, tobacco use, 1 pack per 3 days in September, trying to cut back. Still smoking same. chantix was cost prohibitive.  Wheezing and shortness of breath still doing ok. Albuterol once or twice a week when working outside. No chest pains. Leaf cleanup past few days.  Immunization History  Administered Date(s) Administered  . Fluad Quad(high Dose 65+) 05/06/2019, 05/04/2020  . Influenza, High Dose Seasonal PF 09/05/2018  . Influenza,inj,Quad PF,6+ Mos 05/27/2013, 09/22/2014, 05/23/2016  . PFIZER SARS-COV-2 Vaccination 10/06/2019, 10/30/2019  . Pneumococcal Conjugate-13 01/26/2015  . Pneumococcal Polysaccharide-23 01/18/2010  . Pneumococcal-Unspecified 04/16/2011  . Tdap 06/13/2011, 05/13/2017  plans on covid booster soon.    History Patient Active Problem List    Diagnosis Date Noted  . Skin lesion 10/12/2018  . 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 Hypertension 02/28/2013  . Type 2 diabetes mellitus ([place]) 02/28/2013  . Chronic hepatitis C ([place]) 02/14/2011  . TOBACCO ABUSE 12/10/2007  . 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 Asthma    "when I was a boy" (04/08/2013)  . Bronchitis   . Colon polyps   . Colovesical fistula   . Diverticulosis   . GERD (gastroesophageal reflux disease)   . Hepatitis C    treated with injections  . High cholesterol   . Hypertension   . Type II diabetes mellitus (HCC)    hx of . No longer on medicine   Past Surgical History:  Procedure Laterality Date  . COLONOSCOPY  last 09/06/2016  . CYSTOSCOPY N/A 09/08/2016   Procedure: FIREFLY INJECTIONS;  Surgeon: Cleon Gustin, MD;  Location: [facility];  Service: Urology;  Laterality: N/A;  . INCISION AND DRAINAGE OF WOUND Right 1970's   "leg" (04/08/2013)  . LIVER BIOPSY  ~ 2011  . POLYPECTOMY    . PROCTOSCOPY N/A 09/08/2016   Procedure: RIGID PROCTOSCOPY;  Surgeon: Michael Boston, MD;  Location: [facility];  Service: General;  Laterality: N/A;  . TONSILLECTOMY     Allergies  Allergen Reactions  . Penicillins Anaphylaxis and Other (See Comments)    Has patient had a PCN reaction causing immediate rash, facial/tongue/throat swelling, SOB or lightheadedness with hypotension: Yes Has patient had a PCN reaction causing severe rash involving mucus membranes or skin necrosis:  No Has patient had a PCN reaction that required hospitalization No Has patient had a PCN reaction occurring within the last 10 years: No If all of the above answers are "NO", then may proceed with Cephalosporin use.  . Shellfish Allergy Anaphylaxis   Prior to Admission medications   Medication Sig Start Date End Date Taking? Authorizing Provider  albuterol (VENTOLIN HFA) 108 (90 Base) MCG/ACT inhaler Inhale 2 puffs into the lungs  every 6 (six) hours as needed for wheezing or shortness of breath. 05/04/20  Yes Wendie Agreste, MD  atorvastatin (LIPITOR) 10 MG tablet TAKE 1 TABLET EVERY DAY 01/06/20  Yes Wendie Agreste, MD  finasteride (PROSCAR) 5 MG tablet Take 5 mg by mouth daily. 09/22/17  Yes [provider]  fluticasone (FLOVENT HFA) 110 MCG/ACT inhaler Inhale 1 puff into the lungs in the morning and at bedtime. 05/04/20  Yes Wendie Agreste, MD  losartan-hydrochlorothiazide (HYZAAR) 50-12.5 MG tablet Take 1 tablet by mouth daily. 04/06/20  Yes Wendie Agreste, MD  tamsulosin (FLOMAX) 0.4 MG CAPS capsule Take 0.4 mg by mouth at bedtime.    Yes [provider]   Social History   Socioeconomic History  . Marital status: Married    Spouse name: Not on file  . Number of children: 2  . Years of education: Not on file  . Highest education level: Not on file  Occupational History  . Occupation: truck Education administrator: [employer]    Comment: retired  Tobacco Use  . Smoking status: Current Every Day Smoker    Packs/day: 0.25    Years: 45.00    Pack years: 11.25    Types: Cigarettes  . Smokeless tobacco: Never Used  Vaping Use  . Vaping Use: Never used  Substance and Sexual Activity  . Alcohol use: Yes    Alcohol/week: 8.0 standard drinks    Types: 8 Cans of beer per week  . Drug use: No  . Sexual activity: Yes  Other Topics Concern  . Not on file  Social History Narrative   Pt lives in 1 story home with his wife   Has 2 adult children   Highest level of education: GED & Trade school   Retired Media planner.    Social Determinants of Health   Financial Resource Strain:   . Difficulty of Paying Living Expenses: Not on file  Food Insecurity:   . Worried About Charity fundraiser in the Last Year: Not on file  . Ran Out of Food in the Last Year: Not on file  Transportation Needs:   . Lack of Transportation (Medical): Not on file  . Lack of Transportation  (Non-Medical): Not on file  Physical Activity:   . Days of Exercise per Week: Not on file  . Minutes of Exercise per Session: Not on file  Stress:   . Feeling of Stress : Not on file  Social Connections:   . Frequency of Communication with Friends and Family: Not on file  . Frequency of Social Gatherings with Friends and Family: Not on file  . Attends Religious Services: Not on file  . Active Member of Clubs or Organizations: Not on file  . Attends Archivist Meetings: Not on file  . Marital Status: Not on file  Intimate Partner Violence:   . Fear of Current or Ex-Partner: Not on file  . Emotionally Abused: Not on file  . Physically Abused: Not on file  .  Sexually Abused: Not on file    Review of Systems Per HPI.   Objective:   Vitals:   07/06/20 1052 07/06/20 1056  BP: (!) 162/77 140/70  Pulse: (!) 59   Temp: 97.6 F (36.4 C)   TempSrc: Temporal   SpO2: 97%   Weight: 185 lb (83.9 kg)   Height: 5\' 9"  (1.753 m)      Physical Exam Vitals reviewed.  Constitutional:      General: He is not in acute distress.    Appearance: He is well-developed. He is not ill-appearing.  HENT:     Head: Normocephalic and atraumatic.  Eyes:     Pupils: Pupils are equal, round, and reactive to light.  Neck:     Vascular: No carotid bruit or JVD.  Cardiovascular:     Rate and Rhythm: Normal rate and regular rhythm.     Heart sounds: Normal heart sounds. No murmur heard.   Pulmonary:     Effort: Pulmonary effort is normal. No respiratory distress.     Breath sounds: No rales.     Comments: Normal effort, no distress, few coarse sounds on expiration.  Skin:    General: Skin is warm and dry.  Neurological:     Mental Status: He is alert and oriented to person, place, and time.  Psychiatric:        Mood and Affect: Mood normal.        Behavior: Behavior normal.      Assessment & Plan:  Meric Joye. is a 79 y.o. male . Type 2 diabetes mellitus with  hyperglycemia, without long-term current use of insulin (HCC) - Plan: Hemoglobin A1c  -Check A1c, slight increased at last visit.  Continue diet approach. At his age goal of 7-7.5 may be reasonable.  Cigarette nicotine dependence without complication Wheezing  -Coping techniques discussed with cravings, handout given.  Continue to work on smoking cessation.  Suspect component of chronic bronchitis/COPD.  Improved with Flovent but option of 2 puffs twice daily, especially with a leaf season.  Albuterol if needed.  Recheck 3 months.RTC precautions  No orders of the defined types were placed in this encounter.  Patient Instructions    continue flovent, but can increase to 2 puffs twice per day if needed.  Continue to try quitting smoking. See information below. Let me know if I can help.   Coping with Quitting Smoking  Quitting smoking is a physical and mental challenge. You will face cravings, withdrawal symptoms, and temptation. Before quitting, work with your health care provider to make a plan that can help you cope. Preparation can help you quit and keep you from giving in. How can I cope with cravings? Cravings usually last for 5-10 minutes. If you get through it, the craving will pass. Consider taking the following actions to help you cope with cravings:  Keep your mouth busy: ? Chew sugar-free gum. ? Suck on hard candies or a straw. ? Brush your teeth.  Keep your hands and body busy: ? Immediately change to a different activity when you feel a craving. ? Squeeze or play with a ball. ? Do an activity or a hobby, like making bead jewelry, practicing needlepoint, or working with wood. ? Mix up your normal routine. ? Take a short exercise break. Go for a quick walk or run up and down stairs. ? Spend time in public places where smoking is not allowed.  Focus on doing something kind or helpful for someone else.  Call a friend or family member to talk during a craving.  Join a  support group.  Call a quit line, such as 1-800-QUIT-NOW.  Talk with your health care provider about medicines that might help you cope with cravings and make quitting easier for you. How can I deal with withdrawal symptoms? Your body may experience negative effects as it tries to get used to not having nicotine in the system. These effects are called withdrawal symptoms. They may include:  Feeling hungrier than normal.  Trouble concentrating.  Irritability.  Trouble sleeping.  Feeling depressed.  Restlessness and agitation.  Craving a cigarette. To manage withdrawal symptoms:  Avoid places, people, and activities that trigger your cravings.  Remember why you want to quit.  Get plenty of sleep.  Avoid coffee and other caffeinated drinks. These may worsen some of your symptoms. How can I handle social situations? Social situations can be difficult when you are quitting smoking, especially in the first few weeks. To manage this, you can:  Avoid parties, bars, and other social situations where people might be smoking.  Avoid alcohol.  Leave right away if you have the urge to smoke.  Explain to your family and friends that you are quitting smoking. Ask for understanding and support.  Plan activities with friends or family where smoking is not an option. What are some ways I can cope with stress? Wanting to smoke may cause stress, and stress can make you want to smoke. Find ways to manage your stress. Relaxation techniques can help. For example:  Breathe slowly and deeply, in through your nose and out through your mouth.  Listen to soothing, relaxing music.  Talk with a family member or friend about your stress.  Light a candle.  Soak in a bath or take a shower.  Think about a peaceful place. What are some ways I can prevent weight gain? Be aware that many people gain weight after they quit smoking. However, not everyone does. To keep from gaining weight, have a  plan in place before you quit and stick to the plan after you quit. Your plan should include:  Having healthy snacks. When you have a craving, it may help to: ? Eat plain popcorn, crunchy carrots, celery, or other cut vegetables. ? Chew sugar-free gum.  Changing how you eat: ? Eat small portion sizes at meals. ? Eat 4-6 small meals throughout the day instead of 1-2 large meals a day. ? Be mindful when you eat. Do not watch television or do other things that might distract you as you eat.  Exercising regularly: ? Make time to exercise each day. If you do not have time for a long workout, do short bouts of exercise for 5-10 minutes several times a day. ? Do some form of strengthening exercise, like weight lifting, and some form of aerobic exercise, like running or swimming.  Drinking plenty of water or other low-calorie or no-calorie drinks. Drink 6-8 glasses of water daily, or as much as instructed by your health care provider. Summary  Quitting smoking is a physical and mental challenge. You will face cravings, withdrawal symptoms, and temptation to smoke again. Preparation can help you as you go through these challenges.  You can cope with cravings by keeping your mouth busy (such as by chewing gum), keeping your body and hands busy, and making calls to family, friends, or a helpline for people who want to quit smoking.  You can cope with withdrawal symptoms by avoiding places where  people smoke, avoiding drinks with caffeine, and getting plenty of rest.  Ask your health care provider about the different ways to prevent weight gain, avoid stress, and handle social situations. This information is not intended to replace advice given to you by your health care provider. Make sure you discuss any questions you have with your health care provider. Document Revised: 07/14/2017 Document Reviewed: 07/29/2016 Elsevier Patient Education  [employer].    If you have lab work done today  you will be contacted with your lab results within the next 2 weeks.  If you have not heard from [place] then please contact us. The fastest way to get your results is to register for My Chart.   IF you received an x-ray today, you will receive an invoice from [facility]. Please contact [facility] at [phone] with questions or concerns regarding your invoice.   IF you received labwork today, you will receive an invoice from [facility]. Please contact [facility] at [phone] with questions or concerns regarding your invoice.   Our billing staff will not be able to assist you with questions regarding bills from these companies.  You will be contacted with the lab results as soon as they are available. The fastest way to get your results is to activate your My Chart account. Instructions are located on the last page of this paperwork. If you have not heard from [place] regarding the results in 2 weeks, please contact this office.         Signed, [name], MD [facility]

## 2020-07-07 LAB — HEMOGLOBIN A1C
Est. average glucose Bld gHb Est-mCnc: 154 mg/dL
Hgb A1c MFr Bld: 7 % — ABNORMAL HIGH (ref 4.8–5.6)

## 2020-07-22 ENCOUNTER — Other Ambulatory Visit: Payer: Self-pay | Admitting: Family Medicine

## 2020-07-22 DIAGNOSIS — E785 Hyperlipidemia, unspecified: Secondary | ICD-10-CM

## 2020-10-07 ENCOUNTER — Ambulatory Visit (INDEPENDENT_AMBULATORY_CARE_PROVIDER_SITE_OTHER): Payer: Medicare HMO | Admitting: Family Medicine

## 2020-10-07 ENCOUNTER — Other Ambulatory Visit: Payer: Self-pay

## 2020-10-07 ENCOUNTER — Encounter: Payer: Self-pay | Admitting: Family Medicine

## 2020-10-07 VITALS — BP 138/84 | HR 81 | Temp 98.7°F | Ht 69.0 in | Wt 182.0 lb

## 2020-10-07 DIAGNOSIS — E785 Hyperlipidemia, unspecified: Secondary | ICD-10-CM

## 2020-10-07 DIAGNOSIS — I10 Essential (primary) hypertension: Secondary | ICD-10-CM | POA: Diagnosis not present

## 2020-10-07 DIAGNOSIS — R0600 Dyspnea, unspecified: Secondary | ICD-10-CM | POA: Diagnosis not present

## 2020-10-07 DIAGNOSIS — F17210 Nicotine dependence, cigarettes, uncomplicated: Secondary | ICD-10-CM

## 2020-10-07 DIAGNOSIS — E1165 Type 2 diabetes mellitus with hyperglycemia: Secondary | ICD-10-CM | POA: Diagnosis not present

## 2020-10-07 DIAGNOSIS — R062 Wheezing: Secondary | ICD-10-CM

## 2020-10-07 DIAGNOSIS — R0609 Other forms of dyspnea: Secondary | ICD-10-CM

## 2020-10-07 MED ORDER — BLOOD GLUCOSE METER KIT: PACK | 0 refills | Status: DC

## 2020-10-07 MED ORDER — ALBUTEROL SULFATE HFA 108 (90 BASE) MCG/ACT IN AERS: 2.0000 | INHALATION_SPRAY | Freq: Four times a day (QID) | RESPIRATORY_TRACT | 1 refills | Status: DC | PRN

## 2020-10-07 MED ORDER — LOSARTAN POTASSIUM-HCTZ 50-12.5 MG PO TABS: 1.0000 | ORAL_TABLET | Freq: Every day | ORAL | 1 refills | Status: DC

## 2020-10-07 NOTE — Patient Instructions (Addendum)
Check blood sugar fasting or 2 hours after meal.  Keep up the good work with cutting back on smoking.  Increase flovent to 2 puffs at night, then also 2 puffs in the morning if still needing albuterol throughout the week.    Managing the Challenge of Quitting Smoking Quitting smoking is a physical and mental challenge. You will face cravings, withdrawal symptoms, and temptation. Before quitting, work with your health care provider to make a plan that can help you manage quitting. Preparation can help you quit and keep you from giving in. How to manage lifestyle changes Managing stress Stress can make you want to smoke, and wanting to smoke may cause stress. It is important to find ways to manage your stress. You might try some of the following:  Practice relaxation techniques. ? Breathe slowly and deeply, in through your nose and out through your mouth. ? Listen to music. ? Soak in a bath or take a shower. ? Imagine a peaceful place or vacation.  Get some support. ? Talk with family or friends about your stress. ? Join a support group. ? Talk with a counselor or therapist.  Get some physical activity. ? Go for a walk, run, or bike ride. ? Play a favorite sport. ? Practice yoga.   Medicines Talk with your health care provider about medicines that might help you deal with cravings and make quitting easier for you. Relationships Social situations can be difficult when you are quitting smoking. To manage this, you can:  Avoid parties and other social situations where people might be smoking.  Avoid alcohol.  Leave right away if you have the urge to smoke.  Explain to your family and friends that you are quitting smoking. Ask for support and let them know you might be a bit grumpy.  Plan activities where smoking is not an option. General instructions Be aware that many people gain weight after they quit smoking. However, not everyone does. To keep from gaining weight, have a  plan in place before you quit and stick to the plan after you quit. Your plan should include:  Having healthy snacks. When you have a craving, it may help to: ? Eat popcorn, carrots, celery, or other cut vegetables. ? Chew sugar-free gum.  Changing how you eat. ? Eat small portion sizes at meals. ? Eat 4-6 small meals throughout the day instead of 1-2 large meals a day. ? Be mindful when you eat. Do not watch television or do other things that might distract you as you eat.  Exercising regularly. ? Make time to exercise each day. If you do not have time for a long workout, do short bouts of exercise for 5-10 minutes several times a day. ? Do some form of strengthening exercise, such as weight lifting. ? Do some exercise that gets your heart beating and causes you to breathe deeply, such as walking fast, running, swimming, or biking. This is very important.  Drinking plenty of water or other low-calorie or no-calorie drinks. Drink 6-8 glasses of water daily.   How to recognize withdrawal symptoms Your body and mind may experience discomfort as you try to get used to not having nicotine in your system. These effects are called withdrawal symptoms. They may include:  Feeling hungrier than normal.  Having trouble concentrating.  Feeling irritable or restless.  Having trouble sleeping.  Feeling depressed.  Craving a cigarette. To manage withdrawal symptoms:  Avoid places, people, and activities that trigger your cravings.  Remember  why you want to quit.  Get plenty of sleep.  Avoid coffee and other caffeinated drinks. These may worsen some of your symptoms. These symptoms may surprise you. But be assured that they are normal to have when quitting smoking. How to manage cravings Come up with a plan for how to deal with your cravings. The plan should include the following:  A definition of the specific situation you want to deal with.  An alternative action you will take.  A  clear idea for how this action will help.  The name of someone who might help you with this. Cravings usually last for 5-10 minutes. Consider taking the following actions to help you with your plan to deal with cravings:  Keep your mouth busy. ? Chew sugar-free gum. ? Suck on hard candies or a straw. ? Brush your teeth.  Keep your hands and body busy. ? Change to a different activity right away. ? Squeeze or play with a ball. ? Do an activity or a hobby, such as making bead jewelry, practicing needlepoint, or working with wood. ? Mix up your normal routine. ? Take a short exercise break. Go for a quick walk or run up and down stairs.  Focus on doing something kind or helpful for someone else.  Call a friend or family member to talk during a craving.  Join a support group.  Contact a quitline. Where to find support To get help or find a support group:  Call the [facility]'s Smoking Quitline: 1-800-QUIT NOW 251-342-2892)  Visit the website of the Substance Abuse and [place]: ktimeonline.com  Text QUIT to SmokefreeTXT: 638756 Where to find more information Visit these websites to find more information on quitting smoking:  [place]: www.smokefree.gov  American Lung Association: www.lung.org  American Cancer Society: www.cancer.org  Centers for Disease Control and Prevention: http://www.wolf.info/  American Heart Association: www.heart.org Contact a health care provider if:  You want to change your plan for quitting.  The medicines you are taking are not helping.  Your eating feels out of control or you cannot sleep. Get help right away if:  You feel depressed or become very anxious. Summary  Quitting smoking is a physical and mental challenge. You will face cravings, withdrawal symptoms, and temptation to smoke again. Preparation can help you as you go through these challenges.  Try different techniques to manage  stress, handle social situations, and prevent weight gain.  You can deal with cravings by keeping your mouth busy (such as by chewing gum), keeping your hands and body busy, calling family or friends, or contacting a quitline for people who want to quit smoking.  You can deal with withdrawal symptoms by avoiding places where people smoke, getting plenty of rest, and avoiding drinks with caffeine. This information is not intended to replace advice given to you by your health care provider. Make sure you discuss any questions you have with your health care provider. Document Revised: 05/21/2019 Document Reviewed: 05/21/2019 Elsevier Patient Education  [place].  Type 2 Diabetes Mellitus, Self-Care, Adult When you have type 2 diabetes (type 2 diabetes mellitus), you must make sure your blood sugar (glucose) stays in a healthy range. You can do this with:  Nutrition.  Exercise.  Lifestyle changes.  Medicines or insulin, if needed.  Support from your doctors and others. What are the risks? Having diabetes can raise your risk for other long-term (chronic) health problems. You may get medicines to help prevent  these problems. How to stay aware of blood sugar  Check your blood sugar level every day, as often as told.  Have your A1C (hemoglobin A1C) level checked two or more times a year. Have it checked more often if told.  Your doctor will set personal treatment goals for you. In general, you should have these blood sugar levels: ? Before meals: 80-130 mg/dL (4.4-7.2 mmol/L). ? After meals: below 180 mg/dL (10 mmol/L). ? A1C: less than 7%.   How to manage high and low blood sugar Symptoms of high blood sugar High blood sugar is also called hyperglycemia. Know the symptoms of high blood sugar. These may include:  More thirst.  Hunger.  Feeling very tired.  Needing to pee (urinate) more often than normal.  Seeing things blurry. Symptoms of low blood sugar Low blood  sugar is also called hypoglycemia. This is when blood sugar is at or below 70 mg/dL (3.9 mmol/L). Symptoms may include:  Hunger.  Feeling worried or nervous (anxious).  Feeling sweaty and cold to the touch (clammy).  Being dizzy or light-headed.  Feeling sleepy.  A fast heartbeat.  Feeling grouchy (irritable).  Tingling or loss of feeling (numbness) around your mouth, lips, or tongue.  Restless sleep. Diabetes medicines can cause low blood sugar. You are more at risk:  While you exercise.  After exercise.  During sleep.  When you are sick.  When you skip meals or do not eat for a long time. Treating low blood sugar If you think you have low blood sugar, eat or drink something sugary right away. Keep 15 grams of a fast-acting carb (carbohydrate) with you all the time. Make sure your family and friends know how to treat you if you cannot treat yourself. Treating very low blood sugar Severe hypoglycemia is when your blood sugar is at or below 54 mg/dL (3 mmol/L). Severe hypoglycemia is an emergency. Do not wait to see if the symptoms will go away. Get medical help right away. Call your local emergency services (911 in the [place] Do not drive yourself to the hospital. You may need a glucagon shot if you have very low blood sugar and you cannot eat or drink. Have a family member or friend learn how to check your blood sugar and how to give you a glucagon shot. Ask your doctor if you should have a kit for glucagon shots. Follow these instructions at home: Medicines  Take diabetes medicines as told. If your doctor prescribed insulin or diabetes medicines, take them each day.  Do not run out of insulin or other medicines. Plan ahead.  If you use insulin, change the amount you take based on how active you are and what foods you eat. Your doctor will tell you how to do this.  Take over-the-counter and prescription medicines only as told by your doctor. Eating and drinking  Eat  healthy foods. These include: ? Low-fat (lean) proteins. ? Complex carbs, such as whole grains. ? Fresh fruits and vegetables. ? Low-fat dairy products. ? Healthy fats.  Meet with a food expert (dietitian) to make an eating plan.  Follow instructions from your doctor about what you cannot eat or drink.  Drink enough fluid to keep your pee (urine) pale yellow.  Keep track of carbs that you eat. Read food labels and learn serving sizes of foods.  Follow your sick-day plan when you cannot eat or drink as normal. Make this plan with your doctor so it is ready to use.  Activity  Exercise as told by your doctor. You may need to: ? Do stretching and strength exercises 2 or more times a week. ? Do 150 minutes or more of exercise each week that makes your heart beat faster and makes you sweat.  Spread out your exercise over 3 or more days a week.  Do not go more than 2 days in a row without exercise.  Talk with your doctor before you start a new exercise. Your doctor may tell you to change: ? How much insulin or medicines you take. ? How much food you eat. Lifestyle  Do not use any products that contain nicotine or tobacco, such as cigarettes, e-cigarettes, and chewing tobacco. If you need help quitting, ask your doctor.  If you drink alcohol and your doctor says alcohol is safe for you: ? Limit how much you use to:  0-1 drink a day for women who are not pregnant.  0-2 drinks a day for men. ? Be aware of how much alcohol is in your drink. In the [place] one drink equals one 12 oz bottle of beer (355 mL), one 5 oz glass of wine (148 mL), or one 1 oz glass of hard liquor (44 mL).  Learn to deal with stress. If you need help, ask your doctor. Body care  Stay up to date with your shots (immunizations).  Have your eyes and feet checked by a doctor as often as told.  Check your skin and feet every day. Check for cuts, bruises, redness, blisters, or sores.  Brush your teeth and gums  two times a day. Floss one or more times a day.  Go to the dentist one or more times every 6 months.  Stay at a healthy weight.   General instructions  Share your diabetes care plan with: ? Your work or school. ? People you live with.  Carry a card or wear jewelry that says you have diabetes.  Keep all follow-up visits as told by your doctor. This is important. Questions to ask your doctor  Do I need to meet with a certified expert in diabetes education and care?  Where can I find a support group? Where to find more information  American Diabetes Association: www.diabetes.org  American Association of Diabetes Care and Education Specialists: www.diabeteseducator.org  International Diabetes Federation: MemberVerification.ca Summary  When you have type 2 diabetes, you must make sure your blood sugar (glucose) stays in a healthy range. You can do this with nutrition, exercise, medicines and insulin, and support from doctors and others.  Check your blood sugar every day, or as often as told.  Having diabetes can raise your risk for other long-term health problems. You may get medicines to help prevent these problems.  Share your diabetes management plan with people at work, school, and home.  Keep all follow-up visits as told by your doctor. This is important. This information is not intended to replace advice given to you by your health care provider. Make sure you discuss any questions you have with your health care provider. Document Revised: 03/04/2020 Document Reviewed: 03/04/2020 Elsevier Patient Education  2021 [employer].    If you have lab work done today you will be contacted with your lab results within the next 2 weeks.  If you have not heard from [place] then please contact us. The fastest way to get your results is to register for My Chart.   IF you received an x-ray today, you will receive an invoice from [facility]. Please contact [facility] at  [phone] with questions or concerns regarding your invoice.   IF you received labwork today, you will receive an invoice from [facility]. Please contact [facility] at [phone] with questions or concerns regarding your invoice.   Our billing staff will not be able to assist you with questions regarding bills from these companies.  You will be contacted with the lab results as soon as they are available. The fastest way to get your results is to activate your My Chart account. Instructions are located on the last page of this paperwork. If you have not heard from [place] regarding the results in 2 weeks, please contact this office.

## 2020-10-07 NOTE — Progress Notes (Signed)
Subjective:  Patient ID: Phillip Maynard., male    DOB: 24-Feb-1941  Age: 80 y.o. MRN: [id number]  CC:  Chief Complaint  Patient presents with  . Follow-up    On Diabetes. Pt reports no issues with BS that he has noticed since last OV. PT reports his BS meter is broke and needs a new one. Pt is thinking about buying an OVC meter, but is unsure what is a better option. PT reports he has cut back on his smoking since last OV, but hasn't quite yet. Pt is wondering if the provider still wanted him to use the Flovent medication.    HPI Fabien [employer]. presents for   Diabetes: With hyperglycemia.  Borderline A1c but improved at last visit November 22 at 7.0.  Diet controlled, he is on statin and ARB.  Microalbumin: Normal ratio September 2020.  Optho, foot exam, pneumovax: up to date.  Needs new meter.   Lab Results  Component Value Date   HGBA1C 7.0 (H) 07/06/2020   HGBA1C 7.2 (H) 04/06/2020   HGBA1C 6.5 (H) 07/30/2019   Lab Results  Component Value Date   MICROALBUR 11.5 09/23/2015   LDLCALC 77 04/06/2020   CREATININE 1.04 04/06/2020   Wheezing/suspected COPD Discussed in November normal BNP previously.  Flovent with albuterol as needed improved symptoms, using albuterol once or twice per week when working outside in November, continued on [employer].  Recommended 2 puffs twice daily if needed for improved control.  Smoking cessation discussed.  He has cut back but still smoking. 1/3-1/4ppd. Only using flovent 1puff BID, albuterol few times per week at night.   Hypertension: Losartan hct  Home readings: under 140/90 - stable, no new side effects of meds.  BP Readings from Last 3 Encounters:  10/07/20 138/84  07/06/20 140/70  05/04/20 139/70   Lab Results  Component Value Date   CREATININE 1.04 04/06/2020   Hyperlipidemia: lipitor 57m qd, no new side effects/myalgias.   Lab Results  Component Value Date   CHOL 144 04/06/2020   HDL 48 04/06/2020   LDLCALC 77  04/06/2020   TRIG 105 04/06/2020   CHOLHDL 3.0 04/06/2020   Lab Results  Component Value Date   ALT 14 04/06/2020   AST 16 04/06/2020   ALKPHOS 64 04/06/2020   BILITOT 0.5 04/06/2020      History Patient Active Problem List   Diagnosis Date Noted  . Skin lesion 10/12/2018  . 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 Hypertension 02/28/2013  . Type 2 diabetes mellitus ([facility] 02/28/2013  . Chronic hepatitis C ([place] 02/14/2011  . TOBACCO ABUSE 12/10/2007  . 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 Asthma    "when I was a boy" (04/08/2013)  . Bronchitis   . Colon polyps   . Colovesical fistula   . Diverticulosis   . GERD (gastroesophageal reflux disease)   . Hepatitis C    treated with injections  . High cholesterol   . Hypertension   . Type II diabetes mellitus (HCC)    hx of . No longer on medicine   Past Surgical History:  Procedure Laterality Date  . COLONOSCOPY  last 09/06/2016  . CYSTOSCOPY N/A 09/08/2016   Procedure: FIREFLY INJECTIONS;  Surgeon: PCleon Gustin MD;  Location: [facility];  Service: Urology;  Laterality: N/A;  . INCISION AND DRAINAGE OF WOUND Right 1970's   "leg" (04/08/2013)  . LIVER BIOPSY  ~ 2011  .  POLYPECTOMY    . PROCTOSCOPY N/A 09/08/2016   Procedure: RIGID PROCTOSCOPY;  Surgeon: Michael Boston, MD;  Location: [facility];  Service: General;  Laterality: N/A;  . TONSILLECTOMY     Allergies  Allergen Reactions  . Penicillins Anaphylaxis and Other (See Comments)    Has patient had a PCN reaction causing immediate rash, facial/tongue/throat swelling, SOB or lightheadedness with hypotension: Yes Has patient had a PCN reaction causing severe rash involving mucus membranes or skin necrosis: No Has patient had a PCN reaction that required hospitalization No Has patient had a PCN reaction occurring within the last 10 years: No If all of the above answers are "NO", then may proceed with  Cephalosporin use.  . Shellfish Allergy Anaphylaxis   Prior to Admission medications   Medication Sig Start Date End Date Taking? Authorizing Provider  albuterol (VENTOLIN HFA) 108 (90 Base) MCG/ACT inhaler Inhale 2 puffs into the lungs every 6 (six) hours as needed for wheezing or shortness of breath. 07/06/20  Yes Wendie Agreste, MD  atorvastatin (LIPITOR) 10 MG tablet TAKE 1 TABLET EVERY DAY 07/22/20  Yes Wendie Agreste, MD  finasteride (PROSCAR) 5 MG tablet Take 5 mg by mouth daily. 09/22/17  Yes [provider]  fluticasone (FLOVENT HFA) 110 MCG/ACT inhaler Inhale 1-2 puffs into the lungs in the morning and at bedtime. 07/06/20  Yes Wendie Agreste, MD  losartan-hydrochlorothiazide (HYZAAR) 50-12.5 MG tablet Take 1 tablet by mouth daily. 04/06/20  Yes Wendie Agreste, MD  tamsulosin (FLOMAX) 0.4 MG CAPS capsule Take 0.4 mg by mouth at bedtime.    Yes [provider]   Social History   Socioeconomic History  . Marital status: Married    Spouse name: Not on file  . Number of children: 2  . Years of education: Not on file  . Highest education level: Not on file  Occupational History  . Occupation: truck Education administrator: [employer]    Comment: retired  Tobacco Use  . Smoking status: Current Every Day Smoker    Packs/day: 0.25    Years: 45.00    Pack years: 11.25    Types: Cigarettes  . Smokeless tobacco: Never Used  Vaping Use  . Vaping Use: Never used  Substance and Sexual Activity  . Alcohol use: Yes    Alcohol/week: 12.0 standard drinks    Types: 12 Cans of beer per week    Comment: 12 pack a week.  . Drug use: No  . Sexual activity: Yes  Other Topics Concern  . Not on file  Social History Narrative   Pt lives in 1 story home with his wife   Has 2 adult children   Highest level of education: GED & Trade school   Retired Media planner.    Social Determinants of Health   Financial Resource Strain: Not on file  Food  Insecurity: Not on file  Transportation Needs: Not on file  Physical Activity: Not on file  Stress: Not on file  Social Connections: Not on file  Intimate Partner Violence: Not on file    Review of Systems  Constitutional: Negative for fatigue and unexpected weight change.  Eyes: Negative for visual disturbance.  Respiratory: Negative for cough, chest tightness and shortness of breath.   Cardiovascular: Negative for chest pain, palpitations and leg swelling.  Gastrointestinal: Negative for abdominal pain and blood in stool.  Neurological: Negative for dizziness, light-headedness and headaches.     Objective:  Vitals:   10/07/20 1040 10/07/20 1048  BP: (!) 155/71 138/84  Pulse: 81   Temp: 98.7 F (37.1 C)   TempSrc: Temporal   SpO2: 99%   Weight: 182 lb (82.6 kg)   Height: _0  (1.753 m)      Physical Exam Vitals reviewed.  Constitutional:      Appearance: He is well-developed and well-nourished.  HENT:     Head: Normocephalic and atraumatic.  Eyes:     Extraocular Movements: EOM normal.     Pupils: Pupils are equal, round, and reactive to light.  Neck:     Vascular: No carotid bruit or JVD.  Cardiovascular:     Rate and Rhythm: Normal rate and regular rhythm.     Heart sounds: Normal heart sounds. No murmur heard.   Pulmonary:     Effort: Pulmonary effort is normal.     Breath sounds: Normal breath sounds. No rales.  Musculoskeletal:        General: No edema.     Right lower leg: No edema.     Left lower leg: No edema.  Skin:    General: Skin is warm and dry.  Neurological:     Mental Status: He is alert and oriented to person, place, and time.  Psychiatric:        Mood and Affect: Mood and affect normal.        Assessment & Plan:  Aubry Tucholski. is a 80 y.o. male . Type 2 diabetes mellitus with hyperglycemia, without long-term current use of insulin (HCC) - Plan: Hemoglobin A1c, blood glucose meter kit and supplies  - new meter with  handout on goal readings. Check A1c.   Wheezing - Plan: albuterol (VENTOLIN HFA) 108 (90 Base) MCG/ACT inhaler DOE (dyspnea on exertion) - Plan: albuterol (VENTOLIN HFA) 108 (90 Base) MCG/ACT inhaler  -Increase Flovent dosage discussed initially 2 puffs in the evening, and can increase to 2 puffs twice daily as needed to lessen albuterol requirements.  Still has albuterol if needed for flares.  Smoking cessation should also help.  Commended on his efforts.  Essential hypertension - Plan: losartan-hydrochlorothiazide (HYZAAR) 50-12.5 MG tablet, Comprehensive metabolic panel  -Stable, continue same regimen, check labs  Cigarette nicotine dependence without complication  -Commended on his efforts on cutting back.  Handout given.  Advised to let me know if I can help.  Unfortunately medication was cost prohibitive previously.  Hyperlipidemia, unspecified hyperlipidemia type - Plan: Comprehensive metabolic panel, Lipid panel  -Tolerating statin, continue same, check labs  Meds ordered this encounter  Medications  . albuterol (VENTOLIN HFA) 108 (90 Base) MCG/ACT inhaler    Sig: Inhale 2 puffs into the lungs every 6 (six) hours as needed for wheezing or shortness of breath.    Dispense:  18 g    Refill:  1  . losartan-hydrochlorothiazide (HYZAAR) 50-12.5 MG tablet    Sig: Take 1 tablet by mouth daily.    Dispense:  90 tablet    Refill:  1  . blood glucose meter kit and supplies    Sig: Dispense based on patient and insurance preference. Use up to once daily.    Dispense:  1 each    Refill:  0    Dispense per covered meter with insurance.    Order Specific Question:   Number of strips    Answer:   100    Order Specific Question:   Number of lancets    Answer:   100  Patient Instructions    Check blood sugar fasting or 2 hours after meal.  Keep up the good work with cutting back on smoking.  Increase flovent to 2 puffs at night, then also 2 puffs in the morning if still needing  albuterol throughout the week.    Managing the Challenge of Quitting Smoking Quitting smoking is a physical and mental challenge. You will face cravings, withdrawal symptoms, and temptation. Before quitting, work with your health care provider to make a plan that can help you manage quitting. Preparation can help you quit and keep you from giving in. How to manage lifestyle changes Managing stress Stress can make you want to smoke, and wanting to smoke may cause stress. It is important to find ways to manage your stress. You might try some of the following:  Practice relaxation techniques. ? Breathe slowly and deeply, in through your nose and out through your mouth. ? Listen to music. ? Soak in a bath or take a shower. ? Imagine a peaceful place or vacation.  Get some support. ? Talk with family or friends about your stress. ? Join a support group. ? Talk with a counselor or therapist.  Get some physical activity. ? Go for a walk, run, or bike ride. ? Play a favorite sport. ? Practice yoga.   Medicines Talk with your health care provider about medicines that might help you deal with cravings and make quitting easier for you. Relationships Social situations can be difficult when you are quitting smoking. To manage this, you can:  Avoid parties and other social situations where people might be smoking.  Avoid alcohol.  Leave right away if you have the urge to smoke.  Explain to your family and friends that you are quitting smoking. Ask for support and let them know you might be a bit grumpy.  Plan activities where smoking is not an option. General instructions Be aware that many people gain weight after they quit smoking. However, not everyone does. To keep from gaining weight, have a plan in place before you quit and stick to the plan after you quit. Your plan should include:  Having healthy snacks. When you have a craving, it may help to: ? Eat popcorn, carrots, celery, or  other cut vegetables. ? Chew sugar-free gum.  Changing how you eat. ? Eat small portion sizes at meals. ? Eat 4-6 small meals throughout the day instead of 1-2 large meals a day. ? Be mindful when you eat. Do not watch television or do other things that might distract you as you eat.  Exercising regularly. ? Make time to exercise each day. If you do not have time for a long workout, do short bouts of exercise for 5-10 minutes several times a day. ? Do some form of strengthening exercise, such as weight lifting. ? Do some exercise that gets your heart beating and causes you to breathe deeply, such as walking fast, running, swimming, or biking. This is very important.  Drinking plenty of water or other low-calorie or no-calorie drinks. Drink 6-8 glasses of water daily.   How to recognize withdrawal symptoms Your body and mind may experience discomfort as you try to get used to not having nicotine in your system. These effects are called withdrawal symptoms. They may include:  Feeling hungrier than normal.  Having trouble concentrating.  Feeling irritable or restless.  Having trouble sleeping.  Feeling depressed.  Craving a cigarette. To manage withdrawal symptoms:  Avoid places, people, and activities  that trigger your cravings.  Remember why you want to quit.  Get plenty of sleep.  Avoid coffee and other caffeinated drinks. These may worsen some of your symptoms. These symptoms may surprise you. But be assured that they are normal to have when quitting smoking. How to manage cravings Come up with a plan for how to deal with your cravings. The plan should include the following:  A definition of the specific situation you want to deal with.  An alternative action you will take.  A clear idea for how this action will help.  The name of someone who might help you with this. Cravings usually last for 5-10 minutes. Consider taking the following actions to help you with your  plan to deal with cravings:  Keep your mouth busy. ? Chew sugar-free gum. ? Suck on hard candies or a straw. ? Brush your teeth.  Keep your hands and body busy. ? Change to a different activity right away. ? Squeeze or play with a ball. ? Do an activity or a hobby, such as making bead jewelry, practicing needlepoint, or working with wood. ? Mix up your normal routine. ? Take a short exercise break. Go for a quick walk or run up and down stairs.  Focus on doing something kind or helpful for someone else.  Call a friend or family member to talk during a craving.  Join a support group.  Contact a quitline. Where to find support To get help or find a support group:  Call the [facility]'s Smoking Quitline: 1-800-QUIT NOW 848-092-8774)  Visit the website of the Substance Abuse and [place]: ktimeonline.com  Text QUIT to SmokefreeTXT: 169678 Where to find more information Visit these websites to find more information on quitting smoking:  [place]: www.smokefree.gov  American Lung Association: www.lung.org  American Cancer Society: www.cancer.org  Centers for Disease Control and Prevention: http://www.wolf.info/  American Heart Association: www.heart.org Contact a health care provider if:  You want to change your plan for quitting.  The medicines you are taking are not helping.  Your eating feels out of control or you cannot sleep. Get help right away if:  You feel depressed or become very anxious. Summary  Quitting smoking is a physical and mental challenge. You will face cravings, withdrawal symptoms, and temptation to smoke again. Preparation can help you as you go through these challenges.  Try different techniques to manage stress, handle social situations, and prevent weight gain.  You can deal with cravings by keeping your mouth busy (such as by chewing gum), keeping your hands and body busy, calling family or  friends, or contacting a quitline for people who want to quit smoking.  You can deal with withdrawal symptoms by avoiding places where people smoke, getting plenty of rest, and avoiding drinks with caffeine. This information is not intended to replace advice given to you by your health care provider. Make sure you discuss any questions you have with your health care provider. Document Revised: 05/21/2019 Document Reviewed: 05/21/2019 Elsevier Patient Education  [place].  Type 2 Diabetes Mellitus, Self-Care, Adult When you have type 2 diabetes (type 2 diabetes mellitus), you must make sure your blood sugar (glucose) stays in a healthy range. You can do this with:  Nutrition.  Exercise.  Lifestyle changes.  Medicines or insulin, if needed.  Support from your doctors and others. What are the risks? Having diabetes can raise your risk for other long-term (chronic) health problems. You  may get medicines to help prevent these problems. How to stay aware of blood sugar  Check your blood sugar level every day, as often as told.  Have your A1C (hemoglobin A1C) level checked two or more times a year. Have it checked more often if told.  Your doctor will set personal treatment goals for you. In general, you should have these blood sugar levels: ? Before meals: 80-130 mg/dL (4.4-7.2 mmol/L). ? After meals: below 180 mg/dL (10 mmol/L). ? A1C: less than 7%.   How to manage high and low blood sugar Symptoms of high blood sugar High blood sugar is also called hyperglycemia. Know the symptoms of high blood sugar. These may include:  More thirst.  Hunger.  Feeling very tired.  Needing to pee (urinate) more often than normal.  Seeing things blurry. Symptoms of low blood sugar Low blood sugar is also called hypoglycemia. This is when blood sugar is at or below 70 mg/dL (3.9 mmol/L). Symptoms may include:  Hunger.  Feeling worried or nervous (anxious).  Feeling sweaty and  cold to the touch (clammy).  Being dizzy or light-headed.  Feeling sleepy.  A fast heartbeat.  Feeling grouchy (irritable).  Tingling or loss of feeling (numbness) around your mouth, lips, or tongue.  Restless sleep. Diabetes medicines can cause low blood sugar. You are more at risk:  While you exercise.  After exercise.  During sleep.  When you are sick.  When you skip meals or do not eat for a long time. Treating low blood sugar If you think you have low blood sugar, eat or drink something sugary right away. Keep 15 grams of a fast-acting carb (carbohydrate) with you all the time. Make sure your family and friends know how to treat you if you cannot treat yourself. Treating very low blood sugar Severe hypoglycemia is when your blood sugar is at or below 54 mg/dL (3 mmol/L). Severe hypoglycemia is an emergency. Do not wait to see if the symptoms will go away. Get medical help right away. Call your local emergency services (911 in the [place] Do not drive yourself to the hospital. You may need a glucagon shot if you have very low blood sugar and you cannot eat or drink. Have a family member or friend learn how to check your blood sugar and how to give you a glucagon shot. Ask your doctor if you should have a kit for glucagon shots. Follow these instructions at home: Medicines  Take diabetes medicines as told. If your doctor prescribed insulin or diabetes medicines, take them each day.  Do not run out of insulin or other medicines. Plan ahead.  If you use insulin, change the amount you take based on how active you are and what foods you eat. Your doctor will tell you how to do this.  Take over-the-counter and prescription medicines only as told by your doctor. Eating and drinking  Eat healthy foods. These include: ? Low-fat (lean) proteins. ? Complex carbs, such as whole grains. ? Fresh fruits and vegetables. ? Low-fat dairy products. ? Healthy fats.  Meet with a food  expert (dietitian) to make an eating plan.  Follow instructions from your doctor about what you cannot eat or drink.  Drink enough fluid to keep your pee (urine) pale yellow.  Keep track of carbs that you eat. Read food labels and learn serving sizes of foods.  Follow your sick-day plan when you cannot eat or drink as normal. Make this plan with your doctor so  it is ready to use.   Activity  Exercise as told by your doctor. You may need to: ? Do stretching and strength exercises 2 or more times a week. ? Do 150 minutes or more of exercise each week that makes your heart beat faster and makes you sweat.  Spread out your exercise over 3 or more days a week.  Do not go more than 2 days in a row without exercise.  Talk with your doctor before you start a new exercise. Your doctor may tell you to change: ? How much insulin or medicines you take. ? How much food you eat. Lifestyle  Do not use any products that contain nicotine or tobacco, such as cigarettes, e-cigarettes, and chewing tobacco. If you need help quitting, ask your doctor.  If you drink alcohol and your doctor says alcohol is safe for you: ? Limit how much you use to:  0-1 drink a day for women who are not pregnant.  0-2 drinks a day for men. ? Be aware of how much alcohol is in your drink. In the [place] one drink equals one 12 oz bottle of beer (355 mL), one 5 oz glass of wine (148 mL), or one 1 oz glass of hard liquor (44 mL).  Learn to deal with stress. If you need help, ask your doctor. Body care  Stay up to date with your shots (immunizations).  Have your eyes and feet checked by a doctor as often as told.  Check your skin and feet every day. Check for cuts, bruises, redness, blisters, or sores.  Brush your teeth and gums two times a day. Floss one or more times a day.  Go to the dentist one or more times every 6 months.  Stay at a healthy weight.   General instructions  Share your diabetes care plan  with: ? Your work or school. ? People you live with.  Carry a card or wear jewelry that says you have diabetes.  Keep all follow-up visits as told by your doctor. This is important. Questions to ask your doctor  Do I need to meet with a certified expert in diabetes education and care?  Where can I find a support group? Where to find more information  American Diabetes Association: www.diabetes.org  American Association of Diabetes Care and Education Specialists: www.diabeteseducator.org  International Diabetes Federation: MemberVerification.ca Summary  When you have type 2 diabetes, you must make sure your blood sugar (glucose) stays in a healthy range. You can do this with nutrition, exercise, medicines and insulin, and support from doctors and others.  Check your blood sugar every day, or as often as told.  Having diabetes can raise your risk for other long-term health problems. You may get medicines to help prevent these problems.  Share your diabetes management plan with people at work, school, and home.  Keep all follow-up visits as told by your doctor. This is important. This information is not intended to replace advice given to you by your health care provider. Make sure you discuss any questions you have with your health care provider. Document Revised: 03/04/2020 Document Reviewed: 03/04/2020 Elsevier Patient Education  2021 [employer].    If you have lab work done today you will be contacted with your lab results within the next 2 weeks.  If you have not heard from [place] then please contact us. The fastest way to get your results is to register for My Chart.   IF you received an x-ray today,  you will receive an invoice from [facility]. Please contact [facility] at [phone] with questions or concerns regarding your invoice.   IF you received labwork today, you will receive an invoice from [facility]. Please contact [facility] at [phone] with questions  or concerns regarding your invoice.   Our billing staff will not be able to assist you with questions regarding bills from these companies.  You will be contacted with the lab results as soon as they are available. The fastest way to get your results is to activate your My Chart account. Instructions are located on the last page of this paperwork. If you have not heard from [place] regarding the results in 2 weeks, please contact this office.         Signed, [name], MD [facility]

## 2020-10-08 ENCOUNTER — Other Ambulatory Visit: Payer: Self-pay

## 2020-10-08 DIAGNOSIS — N401 Enlarged prostate with lower urinary tract symptoms: Secondary | ICD-10-CM

## 2020-10-08 LAB — COMPREHENSIVE METABOLIC PANEL
ALT: 15 IU/L (ref 0–44)
AST: 17 IU/L (ref 0–40)
Albumin/Globulin Ratio: 1.4 (ref 1.2–2.2)
Albumin: 4.5 g/dL (ref 3.7–4.7)
Alkaline Phosphatase: 60 IU/L (ref 44–121)
BUN/Creatinine Ratio: 11 (ref 10–24)
BUN: 13 mg/dL (ref 8–27)
Bilirubin Total: 1 mg/dL (ref 0.0–1.2)
CO2: 25 mmol/L (ref 20–29)
Calcium: 10.1 mg/dL (ref 8.6–10.2)
Chloride: 98 mmol/L (ref 96–106)
Creatinine, Ser: 1.17 mg/dL (ref 0.76–1.27)
GFR calc Af Amer: 69 mL/min/{1.73_m2} (ref >59)
GFR calc non Af Amer: 60 mL/min/{1.73_m2} (ref >59)
Globulin, Total: 3.2 g/dL (ref 1.5–4.5)
Glucose: 114 mg/dL — ABNORMAL HIGH (ref 65–99)
Potassium: 4.2 mmol/L (ref 3.5–5.2)
Sodium: 140 mmol/L (ref 134–144)
Total Protein: 7.7 g/dL (ref 6.0–8.5)

## 2020-10-08 LAB — LIPID PANEL
Chol/HDL Ratio: 3.3 ratio (ref 0.0–5.0)
Cholesterol, Total: 140 mg/dL (ref 100–199)
HDL: 42 mg/dL (ref >39)
LDL Chol Calc (NIH): 75 mg/dL (ref 0–99)
Triglycerides: 130 mg/dL (ref 0–149)
VLDL Cholesterol Cal: 23 mg/dL (ref 5–40)

## 2020-10-08 LAB — HEMOGLOBIN A1C
Est. average glucose Bld gHb Est-mCnc: 154 mg/dL
Hgb A1c MFr Bld: 7 % — ABNORMAL HIGH (ref 4.8–5.6)

## 2020-10-08 MED ORDER — FINASTERIDE 5 MG PO TABS: 5.0000 mg | ORAL_TABLET | Freq: Every day | ORAL | 0 refills | Status: DC

## 2020-10-09 ENCOUNTER — Telehealth: Payer: Self-pay | Admitting: Family Medicine

## 2020-10-09 NOTE — Telephone Encounter (Signed)
What is the name of the medication? losartan-hydrochlorothiazide (HYZAAR) 50-12.5 MG tablet [[id number]]    Have you contacted your pharmacy to request a refill? [employer] is calling needing this script sent to their mail order service.   Which pharmacy would you like this sent to? [facility] mail order service   Patient notified that their request is being sent to the clinical staff for review and that they should receive a call once it is complete. If they do not receive a call within 72 hours they can check with their pharmacy or our office.

## 2020-10-12 ENCOUNTER — Other Ambulatory Visit: Payer: Self-pay

## 2020-10-12 DIAGNOSIS — I10 Essential (primary) hypertension: Secondary | ICD-10-CM

## 2020-10-12 MED ORDER — LOSARTAN POTASSIUM-HCTZ 50-12.5 MG PO TABS: 1.0000 | ORAL_TABLET | Freq: Every day | ORAL | 1 refills | Status: DC

## 2020-12-04 ENCOUNTER — Ambulatory Visit: Payer: Medicare HMO | Admitting: Urology

## 2020-12-21 ENCOUNTER — Other Ambulatory Visit: Payer: Self-pay | Admitting: Urology

## 2020-12-21 DIAGNOSIS — N401 Enlarged prostate with lower urinary tract symptoms: Secondary | ICD-10-CM

## 2021-01-13 ENCOUNTER — Encounter: Payer: Self-pay | Admitting: Urology

## 2021-01-13 ENCOUNTER — Other Ambulatory Visit: Payer: Self-pay

## 2021-01-13 ENCOUNTER — Ambulatory Visit (INDEPENDENT_AMBULATORY_CARE_PROVIDER_SITE_OTHER): Payer: Medicare HMO | Admitting: Urology

## 2021-01-13 VITALS — BP 134/80 | HR 56 | Resp 14 | Ht 69.0 in | Wt 183.0 lb

## 2021-01-13 DIAGNOSIS — N3001 Acute cystitis with hematuria: Secondary | ICD-10-CM

## 2021-01-13 DIAGNOSIS — R351 Nocturia: Secondary | ICD-10-CM | POA: Insufficient documentation

## 2021-01-13 DIAGNOSIS — N5201 Erectile dysfunction due to arterial insufficiency: Secondary | ICD-10-CM | POA: Insufficient documentation

## 2021-01-13 DIAGNOSIS — N401 Enlarged prostate with lower urinary tract symptoms: Secondary | ICD-10-CM | POA: Diagnosis not present

## 2021-01-13 LAB — URINALYSIS, ROUTINE W REFLEX MICROSCOPIC
Bilirubin, UA: NEGATIVE
Glucose, UA: NEGATIVE
Ketones, UA: NEGATIVE
Leukocytes,UA: NEGATIVE
Nitrite, UA: NEGATIVE
Protein,UA: NEGATIVE
Specific Gravity, UA: 1.025 (ref 1.005–1.030)
Urobilinogen, Ur: 1 mg/dL (ref 0.2–1.0)
pH, UA: 5.5 (ref 5.0–7.5)

## 2021-01-13 LAB — MICROSCOPIC EXAMINATION
Renal Epithel, UA: NONE SEEN /hpf
WBC, UA: NONE SEEN /hpf (ref 0–5)

## 2021-01-13 MED ORDER — FINASTERIDE 5 MG PO TABS: 5.0000 mg | ORAL_TABLET | Freq: Every day | ORAL | 3 refills | Status: DC

## 2021-01-13 MED ORDER — SILDENAFIL CITRATE 20 MG PO TABS: 20.0000 mg | ORAL_TABLET | ORAL | 6 refills | Status: DC | PRN

## 2021-01-13 MED ORDER — TAMSULOSIN HCL 0.4 MG PO CAPS: 0.8000 mg | ORAL_CAPSULE | Freq: Every day | ORAL | 11 refills | Status: DC

## 2021-01-13 MED ORDER — NITROFURANTOIN MONOHYD MACRO 100 MG PO CAPS: 100.0000 mg | ORAL_CAPSULE | Freq: Two times a day (BID) | ORAL | 0 refills | Status: DC

## 2021-01-13 NOTE — Progress Notes (Signed)
01/13/2021 3:03 PM   Phillip Maynard. 1941-04-18 [id number]  Referring provider: Wendie Agreste, MD [address] [place],  [place] [address]  Followup BPH and erectile dysfunction   HPI: Phillip Maynard is a 80yo here for followup for BPH and erectile dysfunction. He is currently on flomax and finasteride daily. IPSS12 QOL 2. Nocturia is stable at 2x. He denies dysuria or hematuria. He does have increased urinary frequency and urgency for the past week. UA is concerning for infection. He has stable erectile dysfunction for which he takes 20-57m of sildenafil prn. No other complaints today.   PMH: Past Medical History:  Diagnosis Date   Asthma    "when I was a boy" (04/08/2013)   Bronchitis    Colon polyps    Colovesical fistula    Diverticulosis    GERD (gastroesophageal reflux disease)    Hepatitis C    treated with injections   High cholesterol    Hypertension    Type II diabetes mellitus (HCC)    hx of . No longer on medicine    Surgical History: Past Surgical History:  Procedure Laterality Date   COLONOSCOPY  last 09/06/2016   CYSTOSCOPY N/A 09/08/2016   Procedure: FIREFLY INJECTIONS;  Surgeon: PCleon Gustin MD;  Location: [facility];  Service: Urology;  Laterality: N/A;   INCISION AND DRAINAGE OF WOUND Right 1970's   "leg" (04/08/2013)   LIVER BIOPSY  ~ 2011   POLYPECTOMY     PROCTOSCOPY N/A 09/08/2016   Procedure: RIGID PROCTOSCOPY;  Surgeon: SMichael Boston MD;  Location: [facility];  Service: General;  Laterality: N/A;   TONSILLECTOMY      Home Medications:  Allergies as of 01/13/2021       Reactions   Penicillins Anaphylaxis, Other (See Comments)   Has patient had a PCN reaction causing immediate rash, facial/tongue/throat swelling, SOB or lightheadedness with hypotension: Yes Has patient had a PCN reaction causing severe rash involving mucus membranes or skin necrosis: No Has patient had a PCN reaction that required hospitalization No Has patient had a  PCN reaction occurring within the last 10 years: No If all of the above answers are "NO", then may proceed with Cephalosporin use.   Shellfish Allergy Anaphylaxis        Medication List        Accurate as of January 13, 2021  3:03 PM. If you have any questions, ask your nurse or doctor.          STOP taking these medications    blood glucose meter kit and supplies Stopped by: PNicolette Bang MD       TAKE these medications    albuterol 108 (90 Base) MCG/ACT inhaler Commonly known as: VENTOLIN HFA Inhale 2 puffs into the lungs every 6 (six) hours as needed for wheezing or shortness of breath.   atorvastatin 10 MG tablet Commonly known as: LIPITOR TAKE 1 TABLET EVERY DAY   finasteride 5 MG tablet Commonly known as: PROSCAR TAKE 1 TABLET EVERY DAY   Flovent HFA 110 MCG/ACT inhaler Generic drug: fluticasone Inhale 1-2 puffs into the lungs in the morning and at bedtime.   losartan-hydrochlorothiazide 50-12.5 MG tablet Commonly known as: HYZAAR Take 1 tablet by mouth daily.   nitrofurantoin (macrocrystal-monohydrate) 100 MG capsule Commonly known as: MACROBID Take 1 capsule (100 mg total) by mouth every 12 (twelve) hours. Started by: PNicolette Bang MD   tamsulosin 0.4 MG Caps capsule Commonly known as: FLOMAX Take  0.4 mg by mouth at bedtime.        Allergies:  Allergies  Allergen Reactions   Penicillins Anaphylaxis and Other (See Comments)    Has patient had a PCN reaction causing immediate rash, facial/tongue/throat swelling, SOB or lightheadedness with hypotension: Yes Has patient had a PCN reaction causing severe rash involving mucus membranes or skin necrosis: No Has patient had a PCN reaction that required hospitalization No Has patient had a PCN reaction occurring within the last 10 years: No If all of the above answers are "NO", then may proceed with Cephalosporin use.   Shellfish Allergy Anaphylaxis    Family History: Family History   Problem Relation Age of Onset   Asthma Mother    Heart attack Mother    Colon polyps Neg Hx    Colon cancer Neg Hx    Esophageal cancer Neg Hx    Rectal cancer Neg Hx    Stomach cancer Neg Hx     Social History:  reports that he has been smoking cigarettes. He has a 11.25 pack-year smoking history. He has never used smokeless tobacco. He reports current alcohol use of about 12.0 standard drinks of alcohol per week. He reports that he does not use drugs.  ROS: All other review of systems were reviewed and are negative except what is noted above in HPI  Physical Exam: BP 134/80   Pulse (!) 56   Resp 14   Ht _0  (1.753 m)   Wt 183 lb (83 kg)   BMI 27.02 kg/m   Constitutional:  Alert and oriented, No acute distress. HEENT: [place] AT, moist mucus membranes.  Trachea midline, no masses. Cardiovascular: No clubbing, cyanosis, or edema. Respiratory: Normal respiratory effort, no increased work of breathing. GI: Abdomen is soft, nontender, nondistended, no abdominal masses GU: No CVA tenderness.  Lymph: No cervical or inguinal lymphadenopathy. Skin: No rashes, bruises or suspicious lesions. Neurologic: Grossly intact, no focal deficits, moving all 4 extremities. Psychiatric: Normal mood and affect.  Laboratory Data: Lab Results  Component Value Date   WBC 8.5 04/04/2019   HGB 14.5 04/04/2019   HCT 44.1 04/04/2019   MCV 93.2 04/04/2019   PLT 252 04/04/2019    Lab Results  Component Value Date   CREATININE 1.17 10/07/2020    Lab Results  Component Value Date   PSA 0.9 05/23/2016   PSA 3.16 09/23/2015    No results found for: TESTOSTERONE  Lab Results  Component Value Date   HGBA1C 7.0 (H) 10/07/2020    Urinalysis    Component Value Date/Time   COLORURINE STRAW (A) 10/01/2016 2105   APPEARANCEUR CLEAR 10/01/2016 2105   LABSPEC 1.006 10/01/2016 2105   PHURINE 5.0 10/01/2016 2105   GLUCOSEU NEGATIVE 10/01/2016 2105   HGBUR NEGATIVE 10/01/2016 2105    BILIRUBINUR NEGATIVE 10/01/2016 2105   BILIRUBINUR negative 06/23/2016 1441   BILIRUBINUR neg 05/27/2013 1636   KETONESUR NEGATIVE 10/01/2016 2105   PROTEINUR NEGATIVE 10/01/2016 2105   UROBILINOGEN 0.2 06/23/2016 1441   UROBILINOGEN 1.0 12/17/2013 0528   NITRITE NEGATIVE 10/01/2016 2105   LEUKOCYTESUR NEGATIVE 10/01/2016 2105    Lab Results  Component Value Date   LABMICR 5.7 05/06/2019   MUCUS neg 04/17/2013   BACTERIA MANY (A) 06/23/2016    Pertinent Imaging:  No results found for this or any previous visit.  No results found for this or any previous visit.  No results found for this or any previous visit.  No results found for this or  any previous visit.  No results found for this or any previous visit.  No results found for this or any previous visit.  No results found for this or any previous visit.  No results found for this or any previous visit.   Assessment & Plan:    1. Benign localized prostatic hyperplasia with lower urinary tract symptoms (LUTS) Continue flomax and finasteride - Urinalysis, Routine w reflex microscopic  2. Nocturia -Continue flomax and finasteride  3. Acute cystitis with hematuria -macrobid 180m BID for 7 days - Urine Culture  4. Erectile dysfunction -sildenafil 20-64mprn   No follow-ups on file.  PaNicolette BangMD  [facility] [place]

## 2021-01-13 NOTE — Patient Instructions (Signed)
Urinary Tract Infection, Adult  A urinary tract infection (UTI) is an infection of any part of the urinary tract. The urinary tract includes the kidneys, ureters, bladder, and urethra. These organs make, store, and get rid of urine in the body. An upper UTI affects the ureters and kidneys. A lower UTI affects the bladder and urethra. What are the causes? Most urinary tract infections are caused by bacteria in your genital area around your urethra, where urine leaves your body. These bacteria grow and cause inflammation of your urinary tract. What increases the risk? You are more likely to develop this condition if:  You have a urinary catheter that stays in place.  You are not able to control when you urinate or have a bowel movement (incontinence).  You are male and you: ? Use a spermicide or diaphragm for birth control. ? Have low estrogen levels. ? Are pregnant.  You have certain genes that increase your risk.  You are sexually active.  You take antibiotic medicines.  You have a condition that causes your flow of urine to slow down, such as: ? An enlarged prostate, if you are male. ? Blockage in your urethra. ? A kidney stone. ? A nerve condition that affects your bladder control (neurogenic bladder). ? Not getting enough to drink, or not urinating often.  You have certain medical conditions, such as: ? Diabetes. ? A weak disease-fighting system (immunesystem). ? Sickle cell disease. ? Gout. ? Spinal cord injury. What are the signs or symptoms? Symptoms of this condition include:  Needing to urinate right away (urgency).  Frequent urination. This may include small amounts of urine each time you urinate.  Pain or burning with urination.  Blood in the urine.  Urine that smells bad or unusual.  Trouble urinating.  Cloudy urine.  Vaginal discharge, if you are male.  Pain in the abdomen or the lower back. You may also have:  Vomiting or a decreased  appetite.  Confusion.  Irritability or tiredness.  A fever or chills.  Diarrhea. The first symptom in older adults may be confusion. In some cases, they may not have any symptoms until the infection has worsened. How is this diagnosed? This condition is diagnosed based on your medical history and a physical exam. You may also have other tests, including:  Urine tests.  Blood tests.  Tests for STIs (sexually transmitted infections). If you have had more than one UTI, a cystoscopy or imaging studies may be done to determine the cause of the infections. How is this treated? Treatment for this condition includes:  Antibiotic medicine.  Over-the-counter medicines to treat discomfort.  Drinking enough water to stay hydrated. If you have frequent infections or have other conditions such as a kidney stone, you may need to see a health care provider who specializes in the urinary tract (urologist). In rare cases, urinary tract infections can cause sepsis. Sepsis is a life-threatening condition that occurs when the body responds to an infection. Sepsis is treated in the hospital with IV antibiotics, fluids, and other medicines. Follow these instructions at home: Medicines  Take over-the-counter and prescription medicines only as told by your health care provider.  If you were prescribed an antibiotic medicine, take it as told by your health care provider. Do not stop using the antibiotic even if you start to feel better. General instructions  Make sure you: ? Empty your bladder often and completely. Do not hold urine for long periods of time. ? Empty your bladder after   sex. ? Wipe from front to back after urinating or having a bowel movement if you are male. Use each tissue only one time when you wipe.  Drink enough fluid to keep your urine pale yellow.  Keep all follow-up visits. This is important.   Contact a health care provider if:  Your symptoms do not get better after 1-2  days.  Your symptoms go away and then return. Get help right away if:  You have severe pain in your back or your lower abdomen.  You have a fever or chills.  You have nausea or vomiting. Summary  A urinary tract infection (UTI) is an infection of any part of the urinary tract, which includes the kidneys, ureters, bladder, and urethra.  Most urinary tract infections are caused by bacteria in your genital area.  Treatment for this condition often includes antibiotic medicines.  If you were prescribed an antibiotic medicine, take it as told by your health care provider. Do not stop using the antibiotic even if you start to feel better.  Keep all follow-up visits. This is important. This information is not intended to replace advice given to you by your health care provider. Make sure you discuss any questions you have with your health care provider. Document Revised: 03/13/2020 Document Reviewed: 03/13/2020 Elsevier Patient Education  2021 [employer]  

## 2021-01-13 NOTE — Progress Notes (Signed)

## 2021-01-15 LAB — URINE CULTURE

## 2021-01-20 NOTE — Progress Notes (Signed)
Results mailed 

## 2021-02-11 DIAGNOSIS — Z03818 Encounter for observation for suspected exposure to other biological agents ruled out: Secondary | ICD-10-CM | POA: Diagnosis not present

## 2021-02-11 DIAGNOSIS — Z20822 Contact with and (suspected) exposure to covid-19: Secondary | ICD-10-CM | POA: Diagnosis not present

## 2021-03-04 ENCOUNTER — Emergency Department (HOSPITAL_COMMUNITY)
Admission: EM | Admit: 2021-03-04 | Discharge: 2021-03-05 | Disposition: A | Discharge status: Home / Self Care | Payer: Medicare HMO | Attending: Emergency Medicine | Admitting: Emergency Medicine

## 2021-03-04 ENCOUNTER — Other Ambulatory Visit: Payer: Self-pay

## 2021-03-04 ENCOUNTER — Emergency Department (HOSPITAL_COMMUNITY): Payer: Medicare HMO

## 2021-03-04 ENCOUNTER — Encounter (HOSPITAL_COMMUNITY): Payer: Self-pay | Admitting: Emergency Medicine

## 2021-03-04 DIAGNOSIS — I10 Essential (primary) hypertension: Secondary | ICD-10-CM | POA: Diagnosis not present

## 2021-03-04 DIAGNOSIS — R059 Cough, unspecified: Secondary | ICD-10-CM | POA: Diagnosis not present

## 2021-03-04 DIAGNOSIS — Z7951 Long term (current) use of inhaled steroids: Secondary | ICD-10-CM | POA: Insufficient documentation

## 2021-03-04 DIAGNOSIS — J45909 Unspecified asthma, uncomplicated: Secondary | ICD-10-CM | POA: Diagnosis not present

## 2021-03-04 DIAGNOSIS — Z20822 Contact with and (suspected) exposure to covid-19: Secondary | ICD-10-CM | POA: Diagnosis not present

## 2021-03-04 DIAGNOSIS — Z79899 Other long term (current) drug therapy: Secondary | ICD-10-CM | POA: Insufficient documentation

## 2021-03-04 DIAGNOSIS — R0602 Shortness of breath: Secondary | ICD-10-CM | POA: Insufficient documentation

## 2021-03-04 DIAGNOSIS — J441 Chronic obstructive pulmonary disease with (acute) exacerbation: Secondary | ICD-10-CM

## 2021-03-04 DIAGNOSIS — F17210 Nicotine dependence, cigarettes, uncomplicated: Secondary | ICD-10-CM | POA: Insufficient documentation

## 2021-03-04 DIAGNOSIS — E119 Type 2 diabetes mellitus without complications: Secondary | ICD-10-CM | POA: Diagnosis not present

## 2021-03-04 LAB — CBC WITH DIFFERENTIAL/PLATELET
Abs Immature Granulocytes: 0.03 10*3/uL (ref 0.00–0.07)
Basophils Absolute: 0.1 10*3/uL (ref 0.0–0.1)
Basophils Relative: 1 %
Eosinophils Absolute: 1.3 10*3/uL — ABNORMAL HIGH (ref 0.0–0.5)
Eosinophils Relative: 14 %
HCT: 41.8 % (ref 39.0–52.0)
Hemoglobin: 14 g/dL (ref 13.0–17.0)
Immature Granulocytes: 0 %
Lymphocytes Relative: 32 %
Lymphs Abs: 3.1 10*3/uL (ref 0.7–4.0)
MCH: 31.3 pg (ref 26.0–34.0)
MCHC: 33.5 g/dL (ref 30.0–36.0)
MCV: 93.5 fL (ref 80.0–100.0)
Monocytes Absolute: 0.8 10*3/uL (ref 0.1–1.0)
Monocytes Relative: 9 %
Neutro Abs: 4.5 10*3/uL (ref 1.7–7.7)
Neutrophils Relative %: 44 %
Platelets: 281 10*3/uL (ref 150–400)
RBC: 4.47 MIL/uL (ref 4.22–5.81)
RDW: 14.2 % (ref 11.5–15.5)
WBC: 9.8 10*3/uL (ref 4.0–10.5)
nRBC: 0 % (ref 0.0–0.2)

## 2021-03-04 NOTE — ED Provider Notes (Signed)
Emergency Medicine Provider Triage Evaluation Note  Phillip Maynard. , a 80 y.o. male  was evaluated in triage.  Pt complains of shortness of breath.  Patient states he has been dealing with chronic waxing and waning shortness of breath.  Over the past few days he feels that it has been worsening.  Reports some mild edema in the lower extremities as well as two-pillow orthopnea.  Also notes a productive cough with clear sputum when his symptoms worsen.  No chest pain.  No nausea or vomiting.  No URI symptoms.  Denies a history of known CHF.  He smokes about 6 to 7 cigarettes/day.  Physical Exam  BP (!) 164/67 (BP Location: Left Arm)   Pulse 68   Temp 97.7 F (36.5 C) (Oral)   Resp 18   Ht 5\' 9"  (1.753 m)   Wt 81.6 kg   SpO2 92%   BMI 26.58 kg/m  Gen:   Awake, no distress   Resp:  Normal effort  MSK:   Moves extremities without difficulty  Other:    Medical Decision Making  Medically screening exam initiated at 11:14 PM.  Appropriate orders placed.  Phillip Maynard. was informed that the remainder of the evaluation will be completed by another provider, this initial triage assessment does not replace that evaluation, and the importance of remaining in the ED until their evaluation is complete.   Rayna Sexton, PA-C 03/04/21 2315    Arnaldo Natal, MD 03/04/21 [phone]

## 2021-03-04 NOTE — ED Triage Notes (Signed)
Patient c/o worsening SOB x2 days with cough. Reports hx of bronchitis.

## 2021-03-05 ENCOUNTER — Encounter (HOSPITAL_COMMUNITY): Payer: Self-pay | Admitting: Emergency Medicine

## 2021-03-05 DIAGNOSIS — R0602 Shortness of breath: Secondary | ICD-10-CM | POA: Diagnosis not present

## 2021-03-05 LAB — TROPONIN I (HIGH SENSITIVITY): Troponin I (High Sensitivity): 7 ng/L (ref <18)

## 2021-03-05 LAB — COMPREHENSIVE METABOLIC PANEL
ALT: 17 U/L (ref 0–44)
AST: 19 U/L (ref 15–41)
Albumin: 3.9 g/dL (ref 3.5–5.0)
Alkaline Phosphatase: 50 U/L (ref 38–126)
Anion gap: 12 (ref 5–15)
BUN: 16 mg/dL (ref 8–23)
CO2: 24 mmol/L (ref 22–32)
Calcium: 9.3 mg/dL (ref 8.9–10.3)
Chloride: 103 mmol/L (ref 98–111)
Creatinine, Ser: 1.1 mg/dL (ref 0.61–1.24)
GFR, Estimated: >60 mL/min (ref >60)
Glucose, Bld: 116 mg/dL — ABNORMAL HIGH (ref 70–99)
Potassium: 3.6 mmol/L (ref 3.5–5.1)
Sodium: 139 mmol/L (ref 135–145)
Total Bilirubin: 0.2 mg/dL — ABNORMAL LOW (ref 0.3–1.2)
Total Protein: 7.5 g/dL (ref 6.5–8.1)

## 2021-03-05 LAB — BRAIN NATRIURETIC PEPTIDE: B Natriuretic Peptide: 41.8 pg/mL (ref 0.0–100.0)

## 2021-03-05 LAB — RESP PANEL BY RT-PCR (FLU A&B, COVID) ARPGX2
Influenza A by PCR: NEGATIVE
Influenza B by PCR: NEGATIVE
SARS Coronavirus 2 by RT PCR: NEGATIVE

## 2021-03-05 MED ORDER — ATROVENT HFA 17 MCG/ACT IN AERS: 2.0000 | INHALATION_SPRAY | Freq: Four times a day (QID) | RESPIRATORY_TRACT | 0 refills | Status: DC | PRN

## 2021-03-05 MED ORDER — ALBUTEROL SULFATE (2.5 MG/3ML) 0.083% IN NEBU: 5.0000 mg | INHALATION_SOLUTION | Freq: Once | RESPIRATORY_TRACT | Status: DC

## 2021-03-05 MED ORDER — COMBIVENT RESPIMAT 20-100 MCG/ACT IN AERS: 1.0000 | INHALATION_SPRAY | Freq: Four times a day (QID) | RESPIRATORY_TRACT | 0 refills | Status: DC

## 2021-03-05 MED ORDER — ALBUTEROL SULFATE HFA 108 (90 BASE) MCG/ACT IN AERS
6.0000 | INHALATION_SPRAY | Freq: Once | RESPIRATORY_TRACT | Status: AC
  Administered 2021-03-05: 6 via RESPIRATORY_TRACT
  Filled 2021-03-05: qty 6.7

## 2021-03-05 MED ORDER — AEROCHAMBER Z-STAT PLUS/MEDIUM MISC
1.0000 | Freq: Once | Status: AC
  Administered 2021-03-05: 1
  Filled 2021-03-05: qty 1

## 2021-03-05 MED ORDER — IPRATROPIUM BROMIDE 0.02 % IN SOLN: 0.5000 mg | Freq: Once | RESPIRATORY_TRACT | Status: DC

## 2021-03-05 MED ORDER — PREDNISONE 20 MG PO TABS: ORAL_TABLET | ORAL | 0 refills | Status: DC

## 2021-03-05 MED ORDER — PREDNISONE 20 MG PO TABS
60.0000 mg | ORAL_TABLET | Freq: Once | ORAL | Status: AC
  Administered 2021-03-05: 60 mg via ORAL
  Filled 2021-03-05: qty 3

## 2021-03-05 NOTE — ED Provider Notes (Signed)
[facility] DEPT Provider Note   CSN: [id number] Arrival date & time: 03/04/21  2244     History Chief Complaint  Patient presents with   Shortness of Breath    Phillip Maynard. is a 80 y.o. male.  The history is provided by the patient.  Shortness of Breath Severity:  Moderate Onset quality:  Gradual Duration: chronic over months as he still smokes 6-7 cigarettes a day but worse in the past week. Timing:  Constant Progression:  Worsening Chronicity:  Chronic Context: not URI and not weather changes   Relieved by:  Nothing Worsened by:  Nothing Ineffective treatments:  Inhaler Associated symptoms: wheezing   Associated symptoms: no chest pain, no fever, no hemoptysis, no rash, no sore throat, no sputum production, no syncope and no vomiting   Risk factors: tobacco use   Risk factors: no prolonged immobilization and no recent surgery       Past Medical History:  Diagnosis Date   Asthma    "when I was a boy" (04/08/2013)   Bronchitis    Colon polyps    Colovesical fistula    Diverticulosis    GERD (gastroesophageal reflux disease)    Hepatitis C    treated with injections   High cholesterol    Hypertension    Type II diabetes mellitus (HCC)    hx of . No longer on medicine    Patient Active Problem List   Diagnosis Date Noted   Benign localized prostatic hyperplasia with lower urinary tract symptoms (LUTS) 01/13/2021   Acute cystitis with hematuria 01/13/2021   Nocturia 01/13/2021   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/13/2021   Skin lesion 10/12/2018   Colovesical fistula s/p robotic colectomy/repair 09/08/2016 07/12/2016   Hypertension 02/28/2013   Type 2 diabetes mellitus ([place]) 02/28/2013   Chronic hepatitis C ([place]) 02/14/2011   TOBACCO ABUSE 12/10/2007   MERALGIA PARESTHETICA 12/10/2007    Past Surgical History:  Procedure Laterality Date   COLONOSCOPY  last 09/06/2016   CYSTOSCOPY N/A 09/08/2016    Procedure: FIREFLY INJECTIONS;  Surgeon: Cleon Gustin, MD;  Location: [facility];  Service: Urology;  Laterality: N/A;   INCISION AND DRAINAGE OF WOUND Right 1970's   "leg" (04/08/2013)   LIVER BIOPSY  ~ 2011   POLYPECTOMY     PROCTOSCOPY N/A 09/08/2016   Procedure: RIGID PROCTOSCOPY;  Surgeon: Michael Boston, MD;  Location: [facility];  Service: General;  Laterality: N/A;   TONSILLECTOMY         Family History  Problem Relation Age of Onset   Asthma Mother    Heart attack Mother    Colon polyps Neg Hx    Colon cancer Neg Hx    Esophageal cancer Neg Hx    Rectal cancer Neg Hx    Stomach cancer Neg Hx     Social History   Tobacco Use   Smoking status: Every Day    Packs/day: 0.25    Years: 45.00    Pack years: 11.25    Types: Cigarettes   Smokeless tobacco: Never  Vaping Use   Vaping Use: Never used  Substance Use Topics   Alcohol use: Yes    Alcohol/week: 12.0 standard drinks    Types: 12 Cans of beer per week    Comment: 12 pack a week.   Drug use: No    Home Medications Prior to Admission medications   Medication Sig Start Date End Date Taking? Authorizing Provider  albuterol (VENTOLIN  HFA) 108 (90 Base) MCG/ACT inhaler Inhale 2 puffs into the lungs every 6 (six) hours as needed for wheezing or shortness of breath. 10/07/20   Wendie Agreste, MD  atorvastatin (LIPITOR) 10 MG tablet TAKE 1 TABLET EVERY DAY 07/22/20   Wendie Agreste, MD  finasteride (PROSCAR) 5 MG tablet Take 1 tablet (5 mg total) by mouth daily. 01/13/21   McKenzie, Candee Furbish, MD  fluticasone (FLOVENT HFA) 110 MCG/ACT inhaler Inhale 1-2 puffs into the lungs in the morning and at bedtime. 07/06/20   Wendie Agreste, MD  losartan-hydrochlorothiazide (HYZAAR) 50-12.5 MG tablet Take 1 tablet by mouth daily. 10/12/20   Wendie Agreste, MD  nitrofurantoin, macrocrystal-monohydrate, (MACROBID) 100 MG capsule Take 1 capsule (100 mg total) by mouth every 12 (twelve) hours. 01/13/21   McKenzie, Candee Furbish, MD   sildenafil (REVATIO) 20 MG tablet Take 1 tablet (20 mg total) by mouth as needed. 01/13/21   McKenzie, Candee Furbish, MD  tamsulosin (FLOMAX) 0.4 MG CAPS capsule Take 2 capsules (0.8 mg total) by mouth at bedtime. 01/13/21   McKenzie, Candee Furbish, MD    Allergies    Penicillins and Shellfish allergy  Review of Systems   Review of Systems  Constitutional:  Negative for fever.  HENT:  Negative for sore throat.   Eyes:  Negative for redness.  Respiratory:  Positive for shortness of breath and wheezing. Negative for hemoptysis and sputum production.   Cardiovascular:  Negative for chest pain, palpitations and syncope.  Gastrointestinal:  Negative for vomiting.  Genitourinary:  Negative for difficulty urinating.  Musculoskeletal:  Negative for neck stiffness.  Skin:  Negative for rash.  Neurological:  Negative for facial asymmetry.  Psychiatric/Behavioral:  Negative for agitation.   All other systems reviewed and are negative.  Physical Exam Updated Vital Signs BP (!) 168/73   Pulse 63   Temp 97.7 F (36.5 C) (Oral)   Resp (!) 21   Ht '5\' 9"'$  (1.753 m)   Wt 81.6 kg   SpO2 95%   BMI 26.58 kg/m   Physical Exam Vitals and nursing note reviewed.  Constitutional:      General: He is not in acute distress.    Appearance: Normal appearance.  HENT:     Head: Normocephalic and atraumatic.     Nose: Nose normal.  Eyes:     Conjunctiva/sclera: Conjunctivae normal.     Pupils: Pupils are equal, round, and reactive to light.  Cardiovascular:     Rate and Rhythm: Normal rate and regular rhythm.     Pulses: Normal pulses.     Heart sounds: Normal heart sounds.  Pulmonary:     Effort: Prolonged expiration present.     Breath sounds: Decreased air movement present. Decreased breath sounds and wheezing present. No rales.  Abdominal:     General: Abdomen is flat. Bowel sounds are normal.     Palpations: Abdomen is soft.     Tenderness: There is no abdominal tenderness. There is no guarding.   Musculoskeletal:        General: Normal range of motion.     Cervical back: Normal range of motion and neck supple.  Skin:    General: Skin is warm and dry.     Capillary Refill: Capillary refill takes less than 2 seconds.  Neurological:     General: No focal deficit present.     Mental Status: He is alert and oriented to person, place, and time.  Psychiatric:  Mood and Affect: Mood normal.        Behavior: Behavior normal.    ED Results / Procedures / Treatments   Labs (all labs ordered are listed, but only abnormal results are displayed) Results for orders placed or performed during the hospital encounter of 03/04/21  Resp Panel by RT-PCR (Flu A&B, Covid) Nasopharyngeal Swab   Specimen: Nasopharyngeal Swab; Nasopharyngeal(NP) swabs in vial transport medium  Result Value Ref Range   SARS Coronavirus 2 by RT PCR NEGATIVE NEGATIVE   Influenza A by PCR NEGATIVE NEGATIVE   Influenza B by PCR NEGATIVE NEGATIVE  Comprehensive metabolic panel  Result Value Ref Range   Sodium 139 135 - 145 mmol/L   Potassium 3.6 3.5 - 5.1 mmol/L   Chloride 103 98 - 111 mmol/L   CO2 24 22 - 32 mmol/L   Glucose, Bld 116 (H) 70 - 99 mg/dL   BUN 16 8 - 23 mg/dL   Creatinine, Ser 1.10 0.61 - 1.24 mg/dL   Calcium 9.3 8.9 - 10.3 mg/dL   Total Protein 7.5 6.5 - 8.1 g/dL   Albumin 3.9 3.5 - 5.0 g/dL   AST 19 15 - 41 U/L   ALT 17 0 - 44 U/L   Alkaline Phosphatase 50 38 - 126 U/L   Total Bilirubin 0.2 (L) 0.3 - 1.2 mg/dL   GFR, Estimated >60 >60 mL/min   Anion gap 12 5 - 15  CBC with Differential  Result Value Ref Range   WBC 9.8 4.0 - 10.5 K/uL   RBC 4.47 4.22 - 5.81 MIL/uL   Hemoglobin 14.0 13.0 - 17.0 g/dL   HCT 41.8 39.0 - 52.0 %   MCV 93.5 80.0 - 100.0 fL   MCH 31.3 26.0 - 34.0 pg   MCHC 33.5 30.0 - 36.0 g/dL   RDW 14.2 11.5 - 15.5 %   Platelets 281 150 - 400 K/uL   nRBC 0.0 0.0 - 0.2 %   Neutrophils Relative % 44 %   Neutro Abs 4.5 1.7 - 7.7 K/uL   Lymphocytes Relative 32 %    Lymphs Abs 3.1 0.7 - 4.0 K/uL   Monocytes Relative 9 %   Monocytes Absolute 0.8 0.1 - 1.0 K/uL   Eosinophils Relative 14 %   Eosinophils Absolute 1.3 (H) 0.0 - 0.5 K/uL   Basophils Relative 1 %   Basophils Absolute 0.1 0.0 - 0.1 K/uL   Immature Granulocytes 0 %   Abs Immature Granulocytes 0.03 0.00 - 0.07 K/uL  Brain natriuretic peptide  Result Value Ref Range   B Natriuretic Peptide 41.8 0.0 - 100.0 pg/mL   DG Chest 2 View  Result Date: 03/04/2021 CLINICAL DATA:  Short of breath for 2 days, cough EXAM: CHEST - 2 VIEW COMPARISON:  04/06/2020 FINDINGS: Frontal and lateral views of the chest demonstrate an unremarkable cardiac silhouette. Chronic interstitial scarring throughout the lungs without airspace disease, effusion, or pneumothorax. No acute bony abnormalities. IMPRESSION: 1. Stable chest, no acute process. Electronically Signed   By: Randa Ngo M.D.   On: 03/04/2021 23:14    EKG EKG Interpretation  Date/Time:  Friday March 05 2021 02:49:38 EDT Ventricular Rate:  53 PR Interval:  194 QRS Duration: 145 QT Interval:  473 QTC Calculation: 445 R Axis:   -63 Text Interpretation: Sinus rhythm RBBB and LAFB Confirmed by Randal Buba, Ansleigh Safer ([id number]) on 03/05/2021 3:10:17 AM  Radiology DG Chest 2 View  Result Date: 03/04/2021 CLINICAL DATA:  Short of breath for 2 days, cough EXAM:  CHEST - 2 VIEW COMPARISON:  04/06/2020 FINDINGS: Frontal and lateral views of the chest demonstrate an unremarkable cardiac silhouette. Chronic interstitial scarring throughout the lungs without airspace disease, effusion, or pneumothorax. No acute bony abnormalities. IMPRESSION: 1. Stable chest, no acute process. Electronically Signed   By: Randa Ngo M.D.   On: 03/04/2021 23:14    Procedures Procedures   Medications Ordered in ED Medications  albuterol (VENTOLIN HFA) 108 (90 Base) MCG/ACT inhaler 6 puff (has no administration in time range)  aerochamber Z-Stat Plus/medium 1 each (has no administration  in time range)    ED Course  I have reviewed the triage vital signs and the nursing notes.  Pertinent labs & imaging results that were available during my care of the patient were reviewed by me and considered in my medical decision making (see chart for details).    MDM Rules/Calculators/A&P                         Given ongoing symptoms of > 24 hours duration one troponin is sufficient to rule out ACS.  Patient has wheezing, from ongoing tobacco use and COPD.  Will start steroids and combivent inhaler and refer patient back to PMD for ongoing care.  O2 saturation has been 96% every time I have been in the room.    Phillip Maynard. was evaluated in Emergency Department on 03/05/2021 for the symptoms described in the history of present illness. He was evaluated in the context of the global COVID-19 pandemic, which necessitated consideration that the patient might be at risk for infection with the SARS-CoV-2 virus that causes COVID-19. Institutional protocols and algorithms that pertain to the evaluation of patients at risk for COVID-19 are in a state of rapid change based on information released by regulatory bodies including the CDC and federal and state organizations. These policies and algorithms were followed during the patient's care in the ED.  Final Clinical Impression(s) / ED Diagnoses Final diagnoses:  None       Return for intractable cough, coughing up blood, fevers > 100.4 unrelieved by medication, shortness of breath, intractable vomiting, chest pain, shortness of breath, weakness, numbness, changes in speech, facial asymmetry, abdominal pain, passing out, Inability to tolerate liquids or food, cough, altered mental status or any concerns. No signs of systemic illness or infection. The patient is nontoxic-appearing on exam and vital signs are within normal limits. I have reviewed the triage vital signs and the nursing notes. Pertinent labs & imaging results that were available  during my care of the patient were reviewed by me and considered in my medical decision making (see chart for details). After history, exam, and medical workup I feel the patient has been appropriately medically screened and is safe for discharge home. Pertinent diagnoses were discussed with the patient. Patient was given return precautions. Rx / DC Orders ED Discharge Orders     None        Yaritsa Savarino, MD 03/05/21 [id number]

## 2021-03-05 NOTE — ED Notes (Signed)
Patient discharged no concerns at this time  

## 2021-03-10 ENCOUNTER — Ambulatory Visit (INDEPENDENT_AMBULATORY_CARE_PROVIDER_SITE_OTHER): Payer: Medicare HMO | Admitting: Family Medicine

## 2021-03-10 ENCOUNTER — Other Ambulatory Visit: Payer: Self-pay

## 2021-03-10 ENCOUNTER — Encounter: Payer: Self-pay | Admitting: Family Medicine

## 2021-03-10 VITALS — BP 136/78 | HR 78 | Temp 98.3°F | Resp 16 | Ht 69.0 in | Wt 189.6 lb

## 2021-03-10 DIAGNOSIS — J441 Chronic obstructive pulmonary disease with (acute) exacerbation: Secondary | ICD-10-CM | POA: Diagnosis not present

## 2021-03-10 DIAGNOSIS — E785 Hyperlipidemia, unspecified: Secondary | ICD-10-CM | POA: Diagnosis not present

## 2021-03-10 MED ORDER — SPIRIVA HANDIHALER 18 MCG IN CAPS: 18.0000 ug | ORAL_CAPSULE | Freq: Every day | RESPIRATORY_TRACT | 12 refills | Status: DC

## 2021-03-10 MED ORDER — ATORVASTATIN CALCIUM 10 MG PO TABS: 10.0000 mg | ORAL_TABLET | Freq: Every day | ORAL | 1 refills | Status: DC

## 2021-03-10 NOTE — Progress Notes (Signed)
Subjective:  Patient ID: Phillip Maynard., male    DOB: 09-13-1940  Age: 80 y.o. MRN: [id number]  CC:  Chief Complaint  Patient presents with   Follow-up    Pt here for follow up from ER visit over the weekend, reports they informed him his SOB is due to COPD has stopped smoking, notes prednisone helped a lot    Hyperlipidemia    Pt notes been unable to get his atorvastatin last 2 months and would like this resent to [place],     HPI [employer]. presents for   COPD: Evaluated at [facility], [place] July 21.  With dyspnea for a few days.  Suspected COPD exacerbation.  Chest x-ray was stable without acute process.  BNP 41.8.  EKG was sinus rhythm, right bundle branch block, left anterior fascicular block.  Normal troponin.  Treated with prednisone, Combivent inhaler.  O2 sat greater than 96% in ER. Since ER visit had improvement of symptoms, prednisone has been helpful. Only taking Combivent at night - notices dyspnea/chest tight feeling resolves with use at that time. Doing ok during the day.  Flovent BID. Not using albuterol since starting combivent. Quit smoking 5 days ago -doing well. Not having cravings.   Hyperlipidemia: Off Lipitor past 2 months.  Needs refill.  Previously taking Lipitor without any new side effects, or myalgias.  Lab Results  Component Value Date   CHOL 140 10/07/2020   HDL 42 10/07/2020   LDLCALC 75 10/07/2020   TRIG 130 10/07/2020   CHOLHDL 3.3 10/07/2020   Lab Results  Component Value Date   ALT 17 03/04/2021   AST 19 03/04/2021   ALKPHOS 50 03/04/2021   BILITOT 0.2 (L) 03/04/2021         History Patient Active Problem List   Diagnosis Date Noted   Benign localized prostatic hyperplasia with lower urinary tract symptoms (LUTS) 01/13/2021   Acute cystitis with hematuria 01/13/2021   Nocturia 01/13/2021   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/13/2021   Skin lesion 10/12/2018   Colovesical fistula s/p robotic  colectomy/repair 09/08/2016 07/12/2016   Hypertension 02/28/2013   Type 2 diabetes mellitus ([place]) 02/28/2013   Chronic hepatitis C ([place]) 02/14/2011   TOBACCO ABUSE 12/10/2007   MERALGIA PARESTHETICA 12/10/2007   Past Medical History:  Diagnosis Date   Asthma    "when I was a boy" (04/08/2013)   Bronchitis    Colon polyps    Colovesical fistula    Diverticulosis    GERD (gastroesophageal reflux disease)    Hepatitis C    treated with injections   High cholesterol    Hypertension    Type II diabetes mellitus (HCC)    hx of . No longer on medicine   Past Surgical History:  Procedure Laterality Date   COLONOSCOPY  last 09/06/2016   CYSTOSCOPY N/A 09/08/2016   Procedure: FIREFLY INJECTIONS;  Surgeon: Cleon Gustin, MD;  Location: [facility];  Service: Urology;  Laterality: N/A;   INCISION AND DRAINAGE OF WOUND Right 1970's   "leg" (04/08/2013)   LIVER BIOPSY  ~ 2011   POLYPECTOMY     PROCTOSCOPY N/A 09/08/2016   Procedure: RIGID PROCTOSCOPY;  Surgeon: Michael Boston, MD;  Location: [facility];  Service: General;  Laterality: N/A;   TONSILLECTOMY     Allergies  Allergen Reactions   Penicillins Anaphylaxis and Other (See Comments)    Has patient had a PCN reaction causing immediate rash, facial/tongue/throat swelling, SOB or lightheadedness  with hypotension: Yes Has patient had a PCN reaction causing severe rash involving mucus membranes or skin necrosis: No Has patient had a PCN reaction that required hospitalization No Has patient had a PCN reaction occurring within the last 10 years: No If all of the above answers are "NO", then may proceed with Cephalosporin use.   Shellfish Allergy Anaphylaxis   Prior to Admission medications   Medication Sig Start Date End Date Taking? Authorizing Provider  albuterol (VENTOLIN HFA) 108 (90 Base) MCG/ACT inhaler Inhale 2 puffs into the lungs every 6 (six) hours as needed for wheezing or shortness of breath. 10/07/20  Yes Wendie Agreste, MD   atorvastatin (LIPITOR) 10 MG tablet TAKE 1 TABLET EVERY DAY 07/22/20  Yes Wendie Agreste, MD  finasteride (PROSCAR) 5 MG tablet Take 1 tablet (5 mg total) by mouth daily. 01/13/21  Yes McKenzie, Candee Furbish, MD  fluticasone (FLOVENT HFA) 110 MCG/ACT inhaler Inhale 1-2 puffs into the lungs in the morning and at bedtime. 07/06/20  Yes Wendie Agreste, MD  Ipratropium-Albuterol (COMBIVENT RESPIMAT) 20-100 MCG/ACT AERS respimat Inhale 1 puff into the lungs every 6 (six) hours. 03/05/21  Yes Palumbo, April, MD  losartan-hydrochlorothiazide (HYZAAR) 50-12.5 MG tablet Take 1 tablet by mouth daily. 10/12/20  Yes Wendie Agreste, MD  nitrofurantoin, macrocrystal-monohydrate, (MACROBID) 100 MG capsule Take 1 capsule (100 mg total) by mouth every 12 (twelve) hours. 01/13/21  Yes McKenzie, Candee Furbish, MD  sildenafil (REVATIO) 20 MG tablet Take 1 tablet (20 mg total) by mouth as needed. 01/13/21  Yes McKenzie, Candee Furbish, MD  tamsulosin (FLOMAX) 0.4 MG CAPS capsule Take 2 capsules (0.8 mg total) by mouth at bedtime. 01/13/21  Yes McKenzie, Candee Furbish, MD  predniSONE (DELTASONE) 20 MG tablet 3 tabs po day one, then 2 po daily x 4 days Patient not taking: Reported on 03/10/2021 03/05/21   Randal Buba, April, MD   Social History   Socioeconomic History   Marital status: Married    Spouse name: Not on file   Number of children: 2   Years of education: Not on file   Highest education level: Not on file  Occupational History   Occupation: truck driver    Employer: [employer]    Comment: retired  Tobacco Use   Smoking status: Every Day    Packs/day: 0.25    Years: 45.00    Pack years: 11.25    Types: Cigarettes   Smokeless tobacco: Never  Vaping Use   Vaping Use: Never used  Substance and Sexual Activity   Alcohol use: Yes    Alcohol/week: 12.0 standard drinks    Types: 12 Cans of beer per week    Comment: 12 pack a week.   Drug use: No   Sexual activity: Yes  Other Topics Concern   Not on file  Social  History Narrative   Pt lives in 1 story home with his wife   Has 2 adult children   Highest level of education: GED & Trade school   Retired Media planner.    Social Determinants of Health   Financial Resource Strain: Not on file  Food Insecurity: Not on file  Transportation Needs: Not on file  Physical Activity: Not on file  Stress: Not on file  Social Connections: Not on file  Intimate Partner Violence: Not on file    Review of Systems  Constitutional:  Negative for fatigue, fever and unexpected weight change.  Eyes:  Negative for visual disturbance.  Respiratory:  Positive for cough. Negative for chest tightness and shortness of breath.   Cardiovascular:  Negative for chest pain, palpitations and leg swelling.  Gastrointestinal:  Negative for abdominal pain and blood in stool.  Neurological:  Negative for dizziness, light-headedness and headaches.    Objective:   Vitals:   03/10/21 0827  BP: 136/78  Pulse: 78  Resp: 16  Temp: 98.3 F (36.8 C)  TempSrc: Temporal  SpO2: 96%  Weight: 189 lb 9.6 oz (86 kg)  Height: '5\' 9"'$  (1.753 m)     Physical Exam Vitals reviewed.  Constitutional:      Appearance: He is well-developed.  HENT:     Head: Normocephalic and atraumatic.  Neck:     Vascular: No carotid bruit or JVD.  Cardiovascular:     Rate and Rhythm: Normal rate and regular rhythm.     Heart sounds: Normal heart sounds. No murmur heard. Pulmonary:     Effort: Pulmonary effort is normal. No respiratory distress.     Breath sounds: Normal breath sounds. No stridor. No wheezing, rhonchi or rales.  Chest:     Chest wall: No tenderness.  Musculoskeletal:     Right lower leg: No edema.     Left lower leg: No edema.  Skin:    General: Skin is warm and dry.  Neurological:     Mental Status: He is alert and oriented to person, place, and time.  Psychiatric:        Mood and Affect: Mood normal.    Assessment & Plan:  Laroyce Mcdonald. is a 80 y.o.  male . COPD exacerbation ([place]) - Plan: tiotropium (SPIRIVA HANDIHALER) 18 MCG inhalation capsule  -Since ER visit.  Nightly chest symptoms, improved with Combivent, but may be able to prevent sx's with spiriva once per day with albuterol only as needed. Continue flovent. Recheck 1 month.   -Commended on smoking cessation.  Handout given on coping with quitting.   Hyperlipidemia, unspecified hyperlipidemia type - Plan: atorvastatin (LIPITOR) 10 MG tablet Restart atorvastatin, repeat labs in 1 month, will be due for diabetes follow-up at that time as well.  Meds ordered this encounter  Medications   tiotropium (SPIRIVA HANDIHALER) 18 MCG inhalation capsule    Sig: Place 1 capsule (18 mcg total) into inhaler and inhale daily.    Dispense:  30 capsule    Refill:  12   atorvastatin (LIPITOR) 10 MG tablet    Sig: Take 1 tablet (10 mg total) by mouth daily.    Dispense:  90 tablet    Refill:  1   Patient Instructions  No change in flovent for now. Start spiriva once per day in place of the combivent. Ok to use albuterol if needed for wheezing or shortness of breath. If the combivent worked better then spiriva change back to that inhaler and let me know (remember that combivent also has albuterol in it, so should not need albuterol inhaler if using combivent).   Recheck in 1 month.    Restart atorvastatin and check labs next visit.   Great work on quitting smoking!  Managing the Challenge of Quitting Smoking Quitting smoking is a physical and mental challenge. You will face cravings, withdrawal symptoms, and temptation. Before quitting, work with your health care provider to make a plan that can help you manage quitting. Preparation canhelp you quit and keep you from giving in. How to manage lifestyle changes Managing stress Stress can make you want to smoke, and wanting  to smoke may cause stress. It is important to find ways to manage your stress. You might try some of the  following: Practice relaxation techniques. Breathe slowly and deeply, in through your nose and out through your mouth. Listen to music. Soak in a bath or take a shower. Imagine a peaceful place or vacation. Get some support. Talk with family or friends about your stress. Join a support group. Talk with a counselor or therapist. Get some physical activity. Go for a walk, run, or bike ride. Play a favorite sport. Practice yoga.  Medicines Talk with your health care provider about medicines that might help you dealwith cravings and make quitting easier for you. Relationships Social situations can be difficult when you are quitting smoking. To manage this, you can: Avoid parties and other social situations where people might be smoking. Avoid alcohol. Leave right away if you have the urge to smoke. Explain to your family and friends that you are quitting smoking. Ask for support and let them know you might be a bit grumpy. Plan activities where smoking is not an option. General instructions Be aware that many people gain weight after they quit smoking. However, not everyone does. To keep from gaining weight, have a plan in place before you quit and stick to the plan after you quit. Your plan should include: Having healthy snacks. When you have a craving, it may help to: Eat popcorn, carrots, celery, or other cut vegetables. Chew sugar-free gum. Changing how you eat. Eat small portion sizes at meals. Eat 4-6 small meals throughout the day instead of 1-2 large meals a day. Be mindful when you eat. Do not watch television or do other things that might distract you as you eat. Exercising regularly. Make time to exercise each day. If you do not have time for a long workout, do short bouts of exercise for 5-10 minutes several times a day. Do some form of strengthening exercise, such as weight lifting. Do some exercise that gets your heart beating and causes you to breathe deeply, such as  walking fast, running, swimming, or biking. This is very important. Drinking plenty of water or other low-calorie or no-calorie drinks. Drink 6-8 glasses of water daily.  How to recognize withdrawal symptoms Your body and mind may experience discomfort as you try to get used to not having nicotine in your system. These effects are called withdrawal symptoms. They may include: Feeling hungrier than normal. Having trouble concentrating. Feeling irritable or restless. Having trouble sleeping. Feeling depressed. Craving a cigarette. To manage withdrawal symptoms: Avoid places, people, and activities that trigger your cravings. Remember why you want to quit. Get plenty of sleep. Avoid coffee and other caffeinated drinks. These may worsen some of your symptoms. These symptoms may surprise you. But be assured that they are normal to havewhen quitting smoking. How to manage cravings Come up with a plan for how to deal with your cravings. The plan should include the following: A definition of the specific situation you want to deal with. An alternative action you will take. A clear idea for how this action will help. The name of someone who might help you with this. Cravings usually last for 5-10 minutes. Consider taking the following actions to help you with your plan to deal with cravings: Keep your mouth busy. Chew sugar-free gum. Suck on hard candies or a straw. Brush your teeth. Keep your hands and body busy. Change to a different activity right away. Squeeze or play with a  ball. Do an activity or a hobby, such as making bead jewelry, practicing needlepoint, or working with wood. Mix up your normal routine. Take a short exercise break. Go for a quick walk or run up and down stairs. Focus on doing something kind or helpful for someone else. Call a friend or family member to talk during a craving. Join a support group. Contact a quitline. Where to find support To get help or find a  support group: Call the [facility]'s Smoking Quitline: 1-800-QUIT NOW 920-680-1471) Visit the website of the Substance Abuse and [place]: ktimeonline.com Text QUIT to SmokefreeTXTAZ:4618977 Where to find more information Visit these websites to find more information on quitting smoking: [place]: www.smokefree.gov American Lung Association: www.lung.org American Cancer Society: www.cancer.org Centers for Disease Control and Prevention: http://www.wolf.info/ American Heart Association: www.heart.org Contact a health care provider if: You want to change your plan for quitting. The medicines you are taking are not helping. Your eating feels out of control or you cannot sleep. Get help right away if: You feel depressed or become very anxious. Summary Quitting smoking is a physical and mental challenge. You will face cravings, withdrawal symptoms, and temptation to smoke again. Preparation can help you as you go through these challenges. Try different techniques to manage stress, handle social situations, and prevent weight gain. You can deal with cravings by keeping your mouth busy (such as by chewing gum), keeping your hands and body busy, calling family or friends, or contacting a quitline for people who want to quit smoking. You can deal with withdrawal symptoms by avoiding places where people smoke, getting plenty of rest, and avoiding drinks with caffeine. This information is not intended to replace advice given to you by your health care provider. Make sure you discuss any questions you have with your healthcare provider. Document Revised: 05/21/2019 Document Reviewed: 05/21/2019 Elsevier Patient Education  2022 [place],   [name], MD [place], [facility] 03/10/21 9:07 AM

## 2021-03-10 NOTE — Patient Instructions (Addendum)
No change in flovent for now. Start spiriva once per day in place of the combivent. Ok to use albuterol if needed for wheezing or shortness of breath. If the combivent worked better then spiriva change back to that inhaler and let me know (remember that combivent also has albuterol in it, so should not need albuterol inhaler if using combivent).   Recheck in 1 month.    Restart atorvastatin and check labs next visit.   Great work on quitting smoking!  Managing the Challenge of Quitting Smoking Quitting smoking is a physical and mental challenge. You will face cravings, withdrawal symptoms, and temptation. Before quitting, work with your health care provider to make a plan that can help you manage quitting. Preparation canhelp you quit and keep you from giving in. How to manage lifestyle changes Managing stress Stress can make you want to smoke, and wanting to smoke may cause stress. It is important to find ways to manage your stress. You might try some of the following: Practice relaxation techniques. Breathe slowly and deeply, in through your nose and out through your mouth. Listen to music. Soak in a bath or take a shower. Imagine a peaceful place or vacation. Get some support. Talk with family or friends about your stress. Join a support group. Talk with a counselor or therapist. Get some physical activity. Go for a walk, run, or bike ride. Play a favorite sport. Practice yoga.  Medicines Talk with your health care provider about medicines that might help you dealwith cravings and make quitting easier for you. Relationships Social situations can be difficult when you are quitting smoking. To manage this, you can: Avoid parties and other social situations where people might be smoking. Avoid alcohol. Leave right away if you have the urge to smoke. Explain to your family and friends that you are quitting smoking. Ask for support and let them know you might be a bit grumpy. Plan  activities where smoking is not an option. General instructions Be aware that many people gain weight after they quit smoking. However, not everyone does. To keep from gaining weight, have a plan in place before you quit and stick to the plan after you quit. Your plan should include: Having healthy snacks. When you have a craving, it may help to: Eat popcorn, carrots, celery, or other cut vegetables. Chew sugar-free gum. Changing how you eat. Eat small portion sizes at meals. Eat 4-6 small meals throughout the day instead of 1-2 large meals a day. Be mindful when you eat. Do not watch television or do other things that might distract you as you eat. Exercising regularly. Make time to exercise each day. If you do not have time for a long workout, do short bouts of exercise for 5-10 minutes several times a day. Do some form of strengthening exercise, such as weight lifting. Do some exercise that gets your heart beating and causes you to breathe deeply, such as walking fast, running, swimming, or biking. This is very important. Drinking plenty of water or other low-calorie or no-calorie drinks. Drink 6-8 glasses of water daily.  How to recognize withdrawal symptoms Your body and mind may experience discomfort as you try to get used to not having nicotine in your system. These effects are called withdrawal symptoms. They may include: Feeling hungrier than normal. Having trouble concentrating. Feeling irritable or restless. Having trouble sleeping. Feeling depressed. Craving a cigarette. To manage withdrawal symptoms: Avoid places, people, and activities that trigger your cravings. Remember why you  want to quit. Get plenty of sleep. Avoid coffee and other caffeinated drinks. These may worsen some of your symptoms. These symptoms may surprise you. But be assured that they are normal to havewhen quitting smoking. How to manage cravings Come up with a plan for how to deal with your cravings.  The plan should include the following: A definition of the specific situation you want to deal with. An alternative action you will take. A clear idea for how this action will help. The name of someone who might help you with this. Cravings usually last for 5-10 minutes. Consider taking the following actions to help you with your plan to deal with cravings: Keep your mouth busy. Chew sugar-free gum. Suck on hard candies or a straw. Brush your teeth. Keep your hands and body busy. Change to a different activity right away. Squeeze or play with a ball. Do an activity or a hobby, such as making bead jewelry, practicing needlepoint, or working with wood. Mix up your normal routine. Take a short exercise break. Go for a quick walk or run up and down stairs. Focus on doing something kind or helpful for someone else. Call a friend or family member to talk during a craving. Join a support group. Contact a quitline. Where to find support To get help or find a support group: Call the [facility]'s Smoking Quitline: 1-800-QUIT NOW 754-320-5362) Visit the website of the Substance Abuse and [place]: ktimeonline.com Text QUIT to SmokefreeTXTAZ:4618977 Where to find more information Visit these websites to find more information on quitting smoking: [place]: www.smokefree.gov American Lung Association: www.lung.org American Cancer Society: www.cancer.org Centers for Disease Control and Prevention: http://www.wolf.info/ American Heart Association: www.heart.org Contact a health care provider if: You want to change your plan for quitting. The medicines you are taking are not helping. Your eating feels out of control or you cannot sleep. Get help right away if: You feel depressed or become very anxious. Summary Quitting smoking is a physical and mental challenge. You will face cravings, withdrawal symptoms, and temptation to smoke again. Preparation  can help you as you go through these challenges. Try different techniques to manage stress, handle social situations, and prevent weight gain. You can deal with cravings by keeping your mouth busy (such as by chewing gum), keeping your hands and body busy, calling family or friends, or contacting a quitline for people who want to quit smoking. You can deal with withdrawal symptoms by avoiding places where people smoke, getting plenty of rest, and avoiding drinks with caffeine. This information is not intended to replace advice given to you by your health care provider. Make sure you discuss any questions you have with your healthcare provider. Document Revised: 05/21/2019 Document Reviewed: 05/21/2019 Elsevier Patient Education  [place].

## 2021-03-19 ENCOUNTER — Telehealth: Payer: Self-pay

## 2021-03-19 NOTE — Telephone Encounter (Signed)
[employer] called pharmacy reached out Dr Rash proscribed Atrovan HFA Inhaler and the patient is on albuterol (VENTOLIN HFA) 108 (90 Base) MCG/ACT inhaler , fluticasone (FLOVENT HFA) 110 MCG/ACT inhaler , tiotropium (SPIRIVA HANDIHALER) 18 MCG inhalation capsule [id number]   Wants to know if this is going to be a problem with taking this additional medication?   [employer] call back [phone]

## 2021-03-20 NOTE — Telephone Encounter (Signed)
Please see last note - changed to spiriva with option to go back to combivent if needed. Should not use atrovent and combivent or atrovent and spiriva. Thanks.    "No change in flovent for now. Start spiriva once per day in place of the combivent. Ok to use albuterol if needed for wheezing or shortness of breath. If the combivent worked better then spiriva change back to that inhaler and let me know (remember that combivent also has albuterol in it, so should not need albuterol inhaler if using combivent)."

## 2021-03-22 ENCOUNTER — Other Ambulatory Visit: Payer: Self-pay

## 2021-03-22 DIAGNOSIS — R0609 Other forms of dyspnea: Secondary | ICD-10-CM

## 2021-03-22 DIAGNOSIS — R062 Wheezing: Secondary | ICD-10-CM

## 2021-03-22 DIAGNOSIS — R0600 Dyspnea, unspecified: Secondary | ICD-10-CM

## 2021-03-22 DIAGNOSIS — E785 Hyperlipidemia, unspecified: Secondary | ICD-10-CM

## 2021-03-22 MED ORDER — ALBUTEROL SULFATE HFA 108 (90 BASE) MCG/ACT IN AERS: 2.0000 | INHALATION_SPRAY | Freq: Four times a day (QID) | RESPIRATORY_TRACT | 1 refills | Status: DC | PRN

## 2021-03-22 MED ORDER — ATORVASTATIN CALCIUM 10 MG PO TABS: 10.0000 mg | ORAL_TABLET | Freq: Every day | ORAL | 1 refills | Status: DC

## 2021-03-22 NOTE — Telephone Encounter (Signed)
Patient picked up the Atrovent on 7/11 per pharmacy. Can patient completely d/c this med?

## 2021-03-22 NOTE — Telephone Encounter (Signed)
Called patient to inform. Patient voiced understanding and states the only inhaled medications he is taking is the Flovent and Albuterol.

## 2021-03-23 ENCOUNTER — Other Ambulatory Visit: Payer: Self-pay | Admitting: Family Medicine

## 2021-03-23 DIAGNOSIS — R0609 Other forms of dyspnea: Secondary | ICD-10-CM

## 2021-03-23 DIAGNOSIS — R0600 Dyspnea, unspecified: Secondary | ICD-10-CM

## 2021-03-23 DIAGNOSIS — R062 Wheezing: Secondary | ICD-10-CM

## 2021-04-07 ENCOUNTER — Encounter: Payer: Self-pay | Admitting: Family Medicine

## 2021-04-07 ENCOUNTER — Ambulatory Visit (INDEPENDENT_AMBULATORY_CARE_PROVIDER_SITE_OTHER): Payer: Medicare HMO | Admitting: Family Medicine

## 2021-04-07 ENCOUNTER — Other Ambulatory Visit: Payer: Self-pay

## 2021-04-07 VITALS — BP 118/74 | HR 74 | Temp 98.2°F | Resp 17 | Ht 69.0 in | Wt 186.0 lb

## 2021-04-07 DIAGNOSIS — J441 Chronic obstructive pulmonary disease with (acute) exacerbation: Secondary | ICD-10-CM | POA: Diagnosis not present

## 2021-04-07 DIAGNOSIS — E119 Type 2 diabetes mellitus without complications: Secondary | ICD-10-CM

## 2021-04-07 DIAGNOSIS — E785 Hyperlipidemia, unspecified: Secondary | ICD-10-CM | POA: Diagnosis not present

## 2021-04-07 DIAGNOSIS — I10 Essential (primary) hypertension: Secondary | ICD-10-CM

## 2021-04-07 LAB — COMPREHENSIVE METABOLIC PANEL
ALT: 13 U/L (ref 0–53)
AST: 17 U/L (ref 0–37)
Albumin: 4.1 g/dL (ref 3.5–5.2)
Alkaline Phosphatase: 56 U/L (ref 39–117)
BUN: 15 mg/dL (ref 6–23)
CO2: 27 mEq/L (ref 19–32)
Calcium: 9.6 mg/dL (ref 8.4–10.5)
Chloride: 98 mEq/L (ref 96–112)
Creatinine, Ser: 1.06 mg/dL (ref 0.40–1.50)
GFR: 67.38 mL/min (ref >60.00)
Glucose, Bld: 167 mg/dL — ABNORMAL HIGH (ref 70–99)
Potassium: 3.7 mEq/L (ref 3.5–5.1)
Sodium: 136 mEq/L (ref 135–145)
Total Bilirubin: 0.8 mg/dL (ref 0.2–1.2)
Total Protein: 7.6 g/dL (ref 6.0–8.3)

## 2021-04-07 LAB — LDL CHOLESTEROL, DIRECT: Direct LDL: 71 mg/dL

## 2021-04-07 LAB — LIPID PANEL
Cholesterol: 159 mg/dL (ref 0–200)
HDL: 44.9 mg/dL (ref >39.00)
NonHDL: 114.37
Total CHOL/HDL Ratio: 4
Triglycerides: 207 mg/dL — ABNORMAL HIGH (ref 0.0–149.0)
VLDL: 41.4 mg/dL — ABNORMAL HIGH (ref 0.0–40.0)

## 2021-04-07 LAB — MICROALBUMIN / CREATININE URINE RATIO
Creatinine,U: 111 mg/dL
Microalb Creat Ratio: 4.6 mg/g (ref 0.0–30.0)
Microalb, Ur: 5.2 mg/dL — ABNORMAL HIGH (ref 0.0–1.9)

## 2021-04-07 LAB — HEMOGLOBIN A1C: Hgb A1c MFr Bld: 7.5 % — ABNORMAL HIGH (ref 4.6–6.5)

## 2021-04-07 MED ORDER — SPIRIVA HANDIHALER 18 MCG IN CAPS: 18.0000 ug | ORAL_CAPSULE | Freq: Every day | RESPIRATORY_TRACT | 12 refills | Status: DC

## 2021-04-07 NOTE — Patient Instructions (Addendum)
For COPD: Continue flovent.  Start spiriva 1 puff per day.  Albuterol only if needed for shortness of breath/wheezing.  Recheck in 3 weeks.  Return to the clinic or go to the nearest emergency room if any of your symptoms worsen or new symptoms occur.   I will check diabetes test and other bloodwork. Try to cut back on sugar containing beverages including beer, cut back on sweets.   Chronic Obstructive Pulmonary Disease  Chronic obstructive pulmonary disease (COPD) is a long-term (chronic) condition that affects the lungs. COPD is a general term that can be used to describe many different lung problems that cause lung inflammation and limitairflow, including chronic bronchitis and emphysema. If you have COPD, your lung function will probably never return to normal. In most cases, it gets worse over time. However, there are steps you can take toslow the progression of the disease and improve your quality of life. What are the causes? This condition may be caused by: Smoking. This is the most common cause. Certain genes passed down through families. What increases the risk? The following factors may make you more likely to develop this condition: Being exposed to secondhand smoke from cigarettes, pipes, or cigars. Being exposed to chemicals and other irritants, such as fumes and dust in the work environment. Having chronic lung conditions or infections. What are the signs or symptoms? Symptoms of this condition include: Shortness of breath, especially during physical activity. Chronic cough with a large amount of thick mucus. Sometimes, the cough may not have any mucus (dry cough). Wheezing and rapid breathing. Gray or bluish discoloration (cyanosis) of the skin, especially in the fingers, toes, or lips. Feeling tired (fatigue). Weight loss. Chest tightness. Frequent infections. Episodes when breathing symptoms become much worse (exacerbations). At the later stages of this disease,  you may have swelling in the ankles, feet,or legs. How is this diagnosed? This condition is diagnosed based on: Your medical history. A physical exam. You may also have tests, including: Lung (pulmonary) function tests. This may include a spirometry test, which measures your ability to exhale properly. Chest X-ray. CT scan. Blood tests. How is this treated? This condition may be treated with: Medicines. These may include inhaled rescue medicines to treat acute exacerbations as well as medicines that you take long-term (maintenance medicines) to prevent flare-ups of COPD. Bronchodilators help treat COPD by dilating the airways to allow increased airflow and make your breathing more comfortable. Steroids can reduce airway inflammation and help prevent exacerbations. Smoking cessation. If you smoke, your health care provider may ask you to quit, and may also recommend therapy or replacement products to help you quit. Pulmonary rehabilitation. This may involve working with a team of health care providers and specialists, such as respiratory, occupational, and physical therapists. Exercise and physical activity. These are beneficial for nearly all people with COPD. Nutrition therapy to gain weight, if you are underweight. Oxygen. Supplemental oxygen therapy is only helpful if you have a low oxygen level in your blood (hypoxemia). Lung surgery or transplant. Palliative care. This is to help people with COPD feel comfortable when treatment is no longer working. Follow these instructions at home: Medicines Take over-the-counter and prescription medicines only as told by your health care provider. This includes inhaled medicines and pills. Talk to your health care provider before taking any cough or allergy medicines. You may need to avoid certain medicines that dry out your airways. Lifestyle If you smoke, the most important thing that you can do is to  stop smoking. Continuing to smoke will cause  the disease to progress faster. Do not use any products that contain nicotine or tobacco. These products include cigarettes, chewing tobacco, and vaping devices, such as e-cigarettes. If you need help quitting, ask your health care provider. Avoid exposure to things that irritate your lungs, such as smoke, chemicals, and fumes. Stay active, but balance activity with periods of rest. Exercise and physical activity will help you maintain your ability to do things you want to do. Learn and use relaxation techniques to manage stress and to control your breathing. Get the right amount of sleep and get quality sleep. Most adults need 7 or more hours per night. Eat healthy foods. Eating smaller, more frequent meals and resting before meals may help you maintain your strength. Controlled breathing Learn and use controlled breathing techniques as directed by your health care provider. Controlled breathing techniques include: Pursed lip breathing. Start by breathing in (inhaling) through your nose for 1 second. Then, purse your lips as if you were going to whistle and breathe out (exhale) through the pursed lips for 2 seconds. Diaphragmatic breathing. Start by putting one hand on your abdomen just above your waist. Inhale slowly through your nose. The hand on your abdomen should move out. Then purse your lips and exhale slowly. You should be able to feel the hand on your abdomen moving in as you exhale.  Controlled coughing Learn and use controlled coughing to clear mucus from your lungs. Controlled coughing is a series of short, progressive coughs. The steps of controlled coughing are: Lean your head slightly forward. Breathe in deeply using diaphragmatic breathing. Try to hold your breath for 3 seconds. Keep your mouth slightly open while coughing twice. Spit any mucus out into a tissue. Rest and repeat the steps once or twice as needed. General instructions Make sure you receive all the vaccines that  your health care provider recommends, especially the pneumococcal and influenza vaccines. Preventing infection and hospitalization is very important when you have COPD. Drink enough fluid to keep your urine pale yellow, unless you have a medical condition that requires fluid restriction. Use oxygen therapy and pulmonary rehabilitation if told by your health care provider. If you require home oxygen therapy, ask your health care provider whether you should purchase a pulse oximeter to measure your oxygen level at home. Work with your health care provider to develop a COPD action plan. This will help you know what steps to take if your condition gets worse. Keep other chronic health conditions under control as told by your health care provider. Avoid extreme temperature and humidity changes. Avoid contact with people who have an illness that spreads from person to person (is contagious), such as viral infections or pneumonia. Keep all follow-up visits. This is important. Contact a health care provider if: You are coughing up more mucus than usual. There is a change in the color or thickness of your mucus. Your breathing is more labored than usual. Your breathing is faster than usual. You have difficulty sleeping. You need to use your rescue medicines or inhalers more often than expected. You have trouble doing routine activities such as getting dressed or walking around the house. Get help right away if: You have shortness of breath while you are resting. You have shortness of breath that prevents you from: Being able to talk. Performing your usual physical activities. You have chest pain lasting longer than 5 minutes. Your skin color is more blue (cyanotic) than usual. You  measure low oxygen saturations for longer than 5 minutes with a pulse oximeter. You have a fever. You feel too tired to breathe normally. These symptoms may represent a serious problem that is an emergency. Do not wait to  see if the symptoms will go away. Get medical help right away. Call your local emergency services (911 in the [place] Do not drive yourself to the hospital. Summary Chronic obstructive pulmonary disease (COPD) is a long-term (chronic) condition that affects the lungs. Your lung function will probably never return to normal. In most cases, it gets worse over time. However, there are steps you can take to slow the progression of the disease and improve your quality of life. Treatment for COPD may include taking medicines, quitting smoking, pulmonary rehabilitation, and changes to diet and exercise. As the disease progresses, you may need oxygen therapy, a lung transplant, or palliative care. To help manage your condition, do not smoke, avoid exposure to things that irritate your lungs, stay up to date on all vaccines, and follow your health care provider's instructions for taking medicines. This information is not intended to replace advice given to you by your health care provider. Make sure you discuss any questions you have with your healthcare provider. Document Revised: 06/09/2020 Document Reviewed: 06/09/2020 [employer] Patient Education  2022 [employer].

## 2021-04-07 NOTE — Progress Notes (Signed)
Subjective:  Patient ID: Phillip Maynard., male    DOB: 1941/03/15  Age: 80 y.o. MRN: [id number]  CC:  Chief Complaint  Patient presents with   Follow-up    Pt here for ED follow up seen for COPD reports feels about the same since then, reports albuterol not helping. Pt needs to discuss alternative treatment.     HPI Phillip Maynard. presents for   COPD Follow-up from July 27, previous COPD exacerbation with ER visit.  Nightly chest symptoms at that time but improved with Combivent, changed to Spiriva once per day with albuterol as needed  Continued on Flovent.  Cessation and handout given on coping with quitting smoking. Since last visit: Not on spiriva - not sure he had filled. Wants sent to [facility].  Using albuterol 2-3 times per day.  Flovent 135mg  1 inh BID Has not returned to smoking.    Hyperlipidemia: Lipitor 10 mg daily.  No new myalgias/side effects.  Lab Results  Component Value Date   CHOL 140 10/07/2020   HDL 42 10/07/2020   LDLCALC 75 10/07/2020   TRIG 130 10/07/2020   CHOLHDL 3.3 10/07/2020   Lab Results  Component Value Date   ALT 17 03/04/2021   AST 19 03/04/2021   ALKPHOS 50 03/04/2021   BILITOT 0.2 (L) 03/04/2021   Diabetes: Diet controlled, he is on ARB and statin. No meds, no home readings.  Some soda, fast food/take out 2-3 times per week.  Some cakes/pies. 2 beers per day.  Microalbumin: nl ratio in 2020 Optho, foot exam, pneumovax:   Lab Results  Component Value Date   HGBA1C 7.0 (H) 10/07/2020   HGBA1C 7.0 (H) 07/06/2020   HGBA1C 7.2 (H) 04/06/2020   Lab Results  Component Value Date   MICROALBUR 11.5 09/23/2015   [place]75 10/07/2020   CREATININE 1.10 03/04/2021    Hypertension: Losartan hydrochlorothiazide 50/12.5 mg QD. Home readings: 120-125/70's.  BP Readings from Last 3 Encounters:  04/07/21 118/74  03/10/21 136/78  03/05/21 (!) 150/68   Lab Results  Component Value Date   CREATININE 1.10 03/04/2021         History Patient Active Problem List   Diagnosis Date Noted   Benign localized prostatic hyperplasia with lower urinary tract symptoms (LUTS) 01/13/2021   Acute cystitis with hematuria 01/13/2021   Nocturia 01/13/2021   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/13/2021   Skin lesion 10/12/2018   Colovesical fistula s/p robotic colectomy/repair 09/08/2016 07/12/2016   Hypertension 02/28/2013   Type 2 diabetes mellitus ([place] 02/28/2013   Chronic hepatitis C ([place] 02/14/2011   TOBACCO ABUSE 12/10/2007   MERALGIA PARESTHETICA 12/10/2007   Past Medical History:  Diagnosis Date   Asthma    "when I was a boy" (04/08/2013)   Bronchitis    Colon polyps    Colovesical fistula    Diverticulosis    GERD (gastroesophageal reflux disease)    Hepatitis C    treated with injections   High cholesterol    Hypertension    Type II diabetes mellitus (HCC)    hx of . No longer on medicine   Past Surgical History:  Procedure Laterality Date   COLONOSCOPY  last 09/06/2016   CYSTOSCOPY N/A 09/08/2016   Procedure: FIREFLY INJECTIONS;  Surgeon: PCleon Gustin MD;  Location: [facility];  Service: Urology;  Laterality: N/A;   INCISION AND DRAINAGE OF WOUND Right 1970's   "leg" (04/08/2013)   LIVER BIOPSY  ~ 2011  POLYPECTOMY     PROCTOSCOPY N/A 09/08/2016   Procedure: RIGID PROCTOSCOPY;  Surgeon: Michael Boston, MD;  Location: [facility];  Service: General;  Laterality: N/A;   TONSILLECTOMY     Allergies  Allergen Reactions   Penicillins Anaphylaxis and Other (See Comments)    Has patient had a PCN reaction causing immediate rash, facial/tongue/throat swelling, SOB or lightheadedness with hypotension: Yes Has patient had a PCN reaction causing severe rash involving mucus membranes or skin necrosis: No Has patient had a PCN reaction that required hospitalization No Has patient had a PCN reaction occurring within the last 10 years: No If all of the above answers are "NO", then may  proceed with Cephalosporin use.   Shellfish Allergy Anaphylaxis   Prior to Admission medications   Medication Sig Start Date End Date Taking? Authorizing Provider  albuterol (VENTOLIN HFA) 108 (90 Base) MCG/ACT inhaler Inhale 2 puffs into the lungs every 6 (six) hours as needed for wheezing or shortness of breath. 03/22/21  Yes Wendie Agreste, MD  atorvastatin (LIPITOR) 10 MG tablet Take 1 tablet (10 mg total) by mouth daily. 03/22/21  Yes Wendie Agreste, MD  finasteride (PROSCAR) 5 MG tablet Take 1 tablet (5 mg total) by mouth daily. 01/13/21  Yes McKenzie, Candee Furbish, MD  fluticasone (FLOVENT HFA) 110 MCG/ACT inhaler Inhale 1-2 puffs into the lungs in the morning and at bedtime. 07/06/20  Yes Wendie Agreste, MD  losartan-hydrochlorothiazide (HYZAAR) 50-12.5 MG tablet Take 1 tablet by mouth daily. 10/12/20  Yes Wendie Agreste, MD  tamsulosin (FLOMAX) 0.4 MG CAPS capsule Take 2 capsules (0.8 mg total) by mouth at bedtime. 01/13/21  Yes McKenzie, Candee Furbish, MD  tiotropium (SPIRIVA HANDIHALER) 18 MCG inhalation capsule Place 1 capsule (18 mcg total) into inhaler and inhale daily. 03/10/21  Yes Wendie Agreste, MD  sildenafil (REVATIO) 20 MG tablet Take 1 tablet (20 mg total) by mouth as needed. Patient not taking: Reported on 04/07/2021 01/13/21   Cleon Gustin, MD   Social History   Socioeconomic History   Marital status: Married    Spouse name: Not on file   Number of children: 2   Years of education: Not on file   Highest education level: Not on file  Occupational History   Occupation: truck driver    Employer: [employer]    Comment: retired  Tobacco Use   Smoking status: Former    Packs/day: 0.25    Years: 45.00    Pack years: 11.25    Types: Cigarettes    Quit date: 03/05/2021    Years since quitting: 0.0   Smokeless tobacco: Never  Vaping Use   Vaping Use: Never used  Substance and Sexual Activity   Alcohol use: Yes    Alcohol/week: 12.0 standard drinks    Types:  12 Cans of beer per week    Comment: 12 pack a week.   Drug use: No   Sexual activity: Yes  Other Topics Concern   Not on file  Social History Narrative   Pt lives in 1 story home with his wife   Has 2 adult children   Highest level of education: GED & Trade school   Retired Media planner.    Social Determinants of Health   Financial Resource Strain: Not on file  Food Insecurity: Not on file  Transportation Needs: Not on file  Physical Activity: Not on file  Stress: Not on file  Social Connections: Not on file  Intimate Partner Violence: Not on file    Review of Systems  Constitutional:  Negative for fatigue and unexpected weight change.  Eyes:  Negative for visual disturbance.  Respiratory:  Positive for shortness of breath (chronic, denies recent changes.). Negative for cough and chest tightness.   Cardiovascular:  Negative for chest pain, palpitations and leg swelling.  Gastrointestinal:  Negative for abdominal pain and blood in stool.  Neurological:  Negative for dizziness, light-headedness and headaches.    Objective:   Vitals:   04/07/21 1043  BP: 118/74  Pulse: 74  Resp: 17  Temp: 98.2 F (36.8 C)  TempSrc: Temporal  SpO2: 96%  Weight: 186 lb (84.4 kg)  Height: '5\' 9"'$  (1.753 m)    Physical Exam Vitals reviewed.  Constitutional:      Appearance: He is well-developed.  HENT:     Head: Normocephalic and atraumatic.  Neck:     Vascular: No carotid bruit or JVD.  Cardiovascular:     Rate and Rhythm: Normal rate and regular rhythm.     Heart sounds: Normal heart sounds. No murmur heard. Pulmonary:     Effort: Pulmonary effort is normal.     Breath sounds: No stridor. Wheezing (expiratory, lower, no respiratory distress.) present. No rales.  Musculoskeletal:     Right lower leg: No edema.     Left lower leg: No edema.  Skin:    General: Skin is warm and dry.  Neurological:     Mental Status: He is alert and oriented to person, place, and  time.  Psychiatric:        Mood and Affect: Mood normal.     Assessment & Plan:  Phillip Sane. is a 80 y.o. male . COPD exacerbation ([place]) - Plan: tiotropium (SPIRIVA HANDIHALER) 18 MCG inhalation capsule  -Still persistent wheeze, dyspnea, but unfortunately some confusion on medication change from last visit.  Reviewed plan today with understanding expressed.  Will prescribe Spiriva locally, continue Flovent same dose, albuterol only if needed for breakthrough symptoms.  Recheck next 3 weeks, consider pulmonary eval if not improving.  Sooner if worse.  ER precautions given.  Essential hypertension  -Stable, continue same regimen  Hyperlipidemia, unspecified hyperlipidemia type - Plan: Comprehensive metabolic panel, Lipid panel  -Check labs, tolerating current statin, continue same  Type 2 diabetes mellitus without complication, without long-term current use of insulin ([place]) - Plan: Microalbumin / creatinine urine ratio, Hemoglobin A1c  -Borderline control by last A1c, decreased diet adherence, recommended avoidance of sugar containing beverages, decrease alcohol, decrease desserts.  Check A1c, urine microalbumin.   Meds ordered this encounter  Medications   tiotropium (SPIRIVA HANDIHALER) 18 MCG inhalation capsule    Sig: Place 1 capsule (18 mcg total) into inhaler and inhale daily.    Dispense:  30 capsule    Refill:  12   Patient Instructions  For COPD: Continue flovent.  Start spiriva 1 puff per day.  Albuterol only if needed for shortness of breath/wheezing.  Recheck in 3 weeks.  Return to the clinic or go to the nearest emergency room if any of your symptoms worsen or new symptoms occur.   I will check diabetes test and other bloodwork. Try to cut back on sugar containing beverages including beer, cut back on sweets.   Chronic Obstructive Pulmonary Disease  Chronic obstructive pulmonary disease (COPD) is a long-term (chronic) condition that affects the lungs. COPD  is a general term that can be used to describe many different lung problems  that cause lung inflammation and limitairflow, including chronic bronchitis and emphysema. If you have COPD, your lung function will probably never return to normal. In most cases, it gets worse over time. However, there are steps you can take toslow the progression of the disease and improve your quality of life. What are the causes? This condition may be caused by: Smoking. This is the most common cause. Certain genes passed down through families. What increases the risk? The following factors may make you more likely to develop this condition: Being exposed to secondhand smoke from cigarettes, pipes, or cigars. Being exposed to chemicals and other irritants, such as fumes and dust in the work environment. Having chronic lung conditions or infections. What are the signs or symptoms? Symptoms of this condition include: Shortness of breath, especially during physical activity. Chronic cough with a large amount of thick mucus. Sometimes, the cough may not have any mucus (dry cough). Wheezing and rapid breathing. Gray or bluish discoloration (cyanosis) of the skin, especially in the fingers, toes, or lips. Feeling tired (fatigue). Weight loss. Chest tightness. Frequent infections. Episodes when breathing symptoms become much worse (exacerbations). At the later stages of this disease, you may have swelling in the ankles, feet,or legs. How is this diagnosed? This condition is diagnosed based on: Your medical history. A physical exam. You may also have tests, including: Lung (pulmonary) function tests. This may include a spirometry test, which measures your ability to exhale properly. Chest X-ray. CT scan. Blood tests. How is this treated? This condition may be treated with: Medicines. These may include inhaled rescue medicines to treat acute exacerbations as well as medicines that you take long-term (maintenance  medicines) to prevent flare-ups of COPD. Bronchodilators help treat COPD by dilating the airways to allow increased airflow and make your breathing more comfortable. Steroids can reduce airway inflammation and help prevent exacerbations. Smoking cessation. If you smoke, your health care provider may ask you to quit, and may also recommend therapy or replacement products to help you quit. Pulmonary rehabilitation. This may involve working with a team of health care providers and specialists, such as respiratory, occupational, and physical therapists. Exercise and physical activity. These are beneficial for nearly all people with COPD. Nutrition therapy to gain weight, if you are underweight. Oxygen. Supplemental oxygen therapy is only helpful if you have a low oxygen level in your blood (hypoxemia). Lung surgery or transplant. Palliative care. This is to help people with COPD feel comfortable when treatment is no longer working. Follow these instructions at home: Medicines Take over-the-counter and prescription medicines only as told by your health care provider. This includes inhaled medicines and pills. Talk to your health care provider before taking any cough or allergy medicines. You may need to avoid certain medicines that dry out your airways. Lifestyle If you smoke, the most important thing that you can do is to stop smoking. Continuing to smoke will cause the disease to progress faster. Do not use any products that contain nicotine or tobacco. These products include cigarettes, chewing tobacco, and vaping devices, such as e-cigarettes. If you need help quitting, ask your health care provider. Avoid exposure to things that irritate your lungs, such as smoke, chemicals, and fumes. Stay active, but balance activity with periods of rest. Exercise and physical activity will help you maintain your ability to do things you want to do. Learn and use relaxation techniques to manage stress and to  control your breathing. Get the right amount of sleep and get quality  sleep. Most adults need 7 or more hours per night. Eat healthy foods. Eating smaller, more frequent meals and resting before meals may help you maintain your strength. Controlled breathing Learn and use controlled breathing techniques as directed by your health care provider. Controlled breathing techniques include: Pursed lip breathing. Start by breathing in (inhaling) through your nose for 1 second. Then, purse your lips as if you were going to whistle and breathe out (exhale) through the pursed lips for 2 seconds. Diaphragmatic breathing. Start by putting one hand on your abdomen just above your waist. Inhale slowly through your nose. The hand on your abdomen should move out. Then purse your lips and exhale slowly. You should be able to feel the hand on your abdomen moving in as you exhale.  Controlled coughing Learn and use controlled coughing to clear mucus from your lungs. Controlled coughing is a series of short, progressive coughs. The steps of controlled coughing are: Lean your head slightly forward. Breathe in deeply using diaphragmatic breathing. Try to hold your breath for 3 seconds. Keep your mouth slightly open while coughing twice. Spit any mucus out into a tissue. Rest and repeat the steps once or twice as needed. General instructions Make sure you receive all the vaccines that your health care provider recommends, especially the pneumococcal and influenza vaccines. Preventing infection and hospitalization is very important when you have COPD. Drink enough fluid to keep your urine pale yellow, unless you have a medical condition that requires fluid restriction. Use oxygen therapy and pulmonary rehabilitation if told by your health care provider. If you require home oxygen therapy, ask your health care provider whether you should purchase a pulse oximeter to measure your oxygen level at home. Work with your  health care provider to develop a COPD action plan. This will help you know what steps to take if your condition gets worse. Keep other chronic health conditions under control as told by your health care provider. Avoid extreme temperature and humidity changes. Avoid contact with people who have an illness that spreads from person to person (is contagious), such as viral infections or pneumonia. Keep all follow-up visits. This is important. Contact a health care provider if: You are coughing up more mucus than usual. There is a change in the color or thickness of your mucus. Your breathing is more labored than usual. Your breathing is faster than usual. You have difficulty sleeping. You need to use your rescue medicines or inhalers more often than expected. You have trouble doing routine activities such as getting dressed or walking around the house. Get help right away if: You have shortness of breath while you are resting. You have shortness of breath that prevents you from: Being able to talk. Performing your usual physical activities. You have chest pain lasting longer than 5 minutes. Your skin color is more blue (cyanotic) than usual. You measure low oxygen saturations for longer than 5 minutes with a pulse oximeter. You have a fever. You feel too tired to breathe normally. These symptoms may represent a serious problem that is an emergency. Do not wait to see if the symptoms will go away. Get medical help right away. Call your local emergency services (911 in the [place] Do not drive yourself to the hospital. Summary Chronic obstructive pulmonary disease (COPD) is a long-term (chronic) condition that affects the lungs. Your lung function will probably never return to normal. In most cases, it gets worse over time. However, there are steps you can take to  slow the progression of the disease and improve your quality of life. Treatment for COPD may include taking medicines, quitting  smoking, pulmonary rehabilitation, and changes to diet and exercise. As the disease progresses, you may need oxygen therapy, a lung transplant, or palliative care. To help manage your condition, do not smoke, avoid exposure to things that irritate your lungs, stay up to date on all vaccines, and follow your health care provider's instructions for taking medicines. This information is not intended to replace advice given to you by your health care provider. Make sure you discuss any questions you have with your healthcare provider. Document Revised: 06/09/2020 Document Reviewed: 06/09/2020 Elsevier Patient Education  2022 [place],   [name], MD [facility] 04/07/21 11:20 AM

## 2021-04-14 ENCOUNTER — Ambulatory Visit: Payer: Medicare HMO | Admitting: Family Medicine

## 2021-05-06 ENCOUNTER — Ambulatory Visit (INDEPENDENT_AMBULATORY_CARE_PROVIDER_SITE_OTHER): Payer: Medicare HMO | Admitting: Family Medicine

## 2021-05-06 ENCOUNTER — Other Ambulatory Visit: Payer: Self-pay

## 2021-05-06 ENCOUNTER — Encounter: Payer: Self-pay | Admitting: Family Medicine

## 2021-05-06 VITALS — BP 132/78 | HR 85 | Temp 98.3°F | Resp 16 | Ht 69.0 in | Wt 186.6 lb

## 2021-05-06 DIAGNOSIS — R062 Wheezing: Secondary | ICD-10-CM

## 2021-05-06 DIAGNOSIS — J449 Chronic obstructive pulmonary disease, unspecified: Secondary | ICD-10-CM

## 2021-05-06 DIAGNOSIS — Z23 Encounter for immunization: Secondary | ICD-10-CM | POA: Diagnosis not present

## 2021-05-06 DIAGNOSIS — I10 Essential (primary) hypertension: Secondary | ICD-10-CM

## 2021-05-06 DIAGNOSIS — E119 Type 2 diabetes mellitus without complications: Secondary | ICD-10-CM | POA: Diagnosis not present

## 2021-05-06 DIAGNOSIS — R0609 Other forms of dyspnea: Secondary | ICD-10-CM

## 2021-05-06 DIAGNOSIS — R0600 Dyspnea, unspecified: Secondary | ICD-10-CM

## 2021-05-06 MED ORDER — FLUTICASONE PROPIONATE HFA 110 MCG/ACT IN AERO: 1.0000 | INHALATION_SPRAY | Freq: Two times a day (BID) | RESPIRATORY_TRACT | 5 refills | Status: DC

## 2021-05-06 MED ORDER — ALBUTEROL SULFATE HFA 108 (90 BASE) MCG/ACT IN AERS: 2.0000 | INHALATION_SPRAY | Freq: Four times a day (QID) | RESPIRATORY_TRACT | 1 refills | Status: DC | PRN

## 2021-05-06 MED ORDER — LOSARTAN POTASSIUM-HCTZ 50-12.5 MG PO TABS: 1.0000 | ORAL_TABLET | Freq: Every day | ORAL | 1 refills | Status: DC

## 2021-05-06 NOTE — Progress Notes (Signed)
Subjective:  Patient ID: Phillip Barrette., male    DOB: Jan 19, 1941  Age: 80 y.o. MRN: [id number]  CC:  Chief Complaint  Patient presents with   COPD    Pt reports new medication has worked really well feels very open after having taking this, decreased need for albuterol inhaler, does still have cough no real change,  no new concerns feels well.     HPI Phillip Cazarez. presents for   COPD Follow-up from August 24, unfortunately some confusion regarding medications at that time and had not yet started Spiriva.  Still using albuterol 2-3 times per day, Flovent 1 inhalation twice daily.  continued to avoid smoking.  We continued his Flovent, started on Spiriva 1 puff/day and albuterol only if needed for breakthrough shortness of breath/wheezing.    Since last visit using spiriva once per day. Helping. Feels a lot better. Still using flovent 2 puff BID. Less need for albuterol - up to 1-2 times per day, but not every day. Still some cough - better with mucinex, clear mucus. no fever/dyspnea. Still some wheeze at times.   Flu vaccine today.  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 No longer on medicine   Past Surgical History:  Procedure Laterality Date   COLONOSCOPY  last 09/06/2016   CYSTOSCOPY N/A 09/08/2016   Procedure: FIREFLY INJECTIONS;  Surgeon: Cleon Gustin, MD;  Location: [facility];  Service: Urology;  Laterality: N/A;   INCISION AND DRAINAGE OF WOUND Right 1970's   "leg" (04/08/2013)   LIVER BIOPSY  ~ 2011   POLYPECTOMY     PROCTOSCOPY N/A 09/08/2016   Procedure: RIGID PROCTOSCOPY;  Surgeon: Michael Boston, MD;  Location: [facility];  Service: General;  Laterality: N/A;   TONSILLECTOMY     Allergies  Allergen Reactions   Penicillins Anaphylaxis and Other (See Comments)    Has patient had a PCN reaction causing immediate rash, facial/tongue/throat swelling, SOB or lightheadedness with hypotension: Yes Has patient had a PCN reaction causing severe rash involving mucus membranes or skin necrosis: No Has patient had a PCN reaction that required hospitalization No Has patient had a PCN reaction occurring within the last 10 years: No If all of the above answers are "NO", then may proceed with Cephalosporin use.   Shellfish Allergy Anaphylaxis   Prior to Admission medications   Medication Sig Start Date End Date Taking? Authorizing Provider  albuterol (VENTOLIN HFA) 108 (90 Base) MCG/ACT inhaler Inhale 2 puffs into the lungs every 6 (six) hours as needed for wheezing or shortness of breath. 03/22/21  Wendie Agreste, MD  atorvastatin (LIPITOR) 10 MG tablet Take 1 tablet (10 mg total) by mouth daily. 03/22/21   Wendie Agreste, MD  finasteride (PROSCAR) 5 MG tablet Take 1 tablet (5 mg total) by mouth daily. 01/13/21   McKenzie, Candee Furbish, MD  fluticasone (FLOVENT HFA) 110 MCG/ACT inhaler Inhale 1-2 puffs into the lungs in the morning and at bedtime. 07/06/20   Wendie Agreste, MD  losartan-hydrochlorothiazide (HYZAAR) 50-12.5 MG tablet Take 1 tablet by mouth daily. 10/12/20   Wendie Agreste, MD  sildenafil (REVATIO) 20 MG  tablet Take 1 tablet (20 mg total) by mouth as needed. Patient not taking: Reported on 04/07/2021 01/13/21   Cleon Gustin, MD  tamsulosin (FLOMAX) 0.4 MG CAPS capsule Take 2 capsules (0.8 mg total) by mouth at bedtime. 01/13/21   McKenzie, Candee Furbish, MD  tiotropium (SPIRIVA HANDIHALER) 18 MCG inhalation capsule Place 1 capsule (18 mcg total) into inhaler and inhale daily. 04/07/21   Wendie Agreste, MD   Social History   Socioeconomic History   Marital status: Married    Spouse name: Not on file   Number of children: 2   Years of education: Not on file   Highest education level: Not on file  Occupational History   Occupation: truck driver    Employer: [employer]    Comment: retired  Tobacco Use   Smoking status: Former    Packs/day: 0.25    Years: 45.00    Pack years: 11.25    Types: Cigarettes    Quit date: 03/05/2021    Years since quitting: 0.1   Smokeless tobacco: Never  Vaping Use   Vaping Use: Never used  Substance and Sexual Activity   Alcohol use: Yes    Alcohol/week: 12.0 standard drinks    Types: 12 Cans of beer per week    Comment: 12 pack a week.   Drug use: No   Sexual activity: Yes  Other Topics Concern   Not on file  Social History Narrative   Pt lives in 1 story home with his wife   Has 2 adult children   Highest level of education: GED & Trade school   Retired Media planner.    Social Determinants of Health   Financial Resource Strain: Not on file  Food Insecurity: Not on file  Transportation Needs: Not on file  Physical Activity: Not on file  Stress: Not on file  Social Connections: Not on file  Intimate Partner Violence: Not on file    Review of Systems  Per HPI.  Objective:   Vitals:   05/06/21 1021  BP: 132/78  Pulse: 85  Resp: 16  Temp: 98.3 F (36.8 C)  TempSrc: Temporal  SpO2: 96%  Weight: 186 lb 9.6 oz (84.6 kg)  Height: 5\' 9"  (1.753 m)     Physical Exam Vitals reviewed.  Constitutional:       Appearance: He is well-developed.  HENT:     Head: Normocephalic and atraumatic.  Neck:     Vascular: No carotid bruit or JVD.  Cardiovascular:     Rate and Rhythm: Normal rate and regular rhythm.     Heart sounds: Normal heart sounds. No murmur heard. Pulmonary:     Effort: Pulmonary effort is normal.     Breath sounds: No rales.     Comments: Speaking full sentences, no retractions, no respiratory distress.  Coarse breath sounds, expiratory diffusely.  Musculoskeletal:     Right lower  leg: No edema.     Left lower leg: No edema.  Skin:    General: Skin is warm and dry.  Neurological:     Mental Status: He is alert and oriented to person, place, and time.  Psychiatric:        Mood and Affect: Mood normal.       Assessment & Plan:  Phillip Maynard. is a 80 y.o. male . Chronic obstructive pulmonary disease, unspecified COPD type ([place]) Wheezing - Plan: fluticasone (FLOVENT HFA) 110 MCG/ACT inhaler, albuterol (VENTOLIN HFA) 108 (90 Base) MCG/ACT inhaler DOE (dyspnea on exertion) - Plan: fluticasone (FLOVENT HFA) 110 MCG/ACT inhaler, albuterol (VENTOLIN HFA) 108 (90 Base) MCG/ACT inhaler  -Improved with addition of Spiriva, continue Flovent 2 puffs twice daily, Spiriva daily, albuterol if needed.  If persistent wheeze, daytime use of albuterol, change Flovent to Advair.  He will update me in the next few weeks.  RTC precautions if worsening.  Essential hypertension - Plan: losartan-hydrochlorothiazide (HYZAAR) 50-12.5 MG tablet  -Stable, labs last visit, plan on 29-month follow-up for diabetes from his last visit.  Meds refilled. Needs flu shot - Plan: Flu Vaccine QUAD High Dose(Fluad)   Meds ordered this encounter  Medications   losartan-hydrochlorothiazide (HYZAAR) 50-12.5 MG tablet    Sig: Take 1 tablet by mouth daily.    Dispense:  90 tablet    Refill:  1   fluticasone (FLOVENT HFA) 110 MCG/ACT inhaler    Sig: Inhale 1-2 puffs into the lungs in the morning and at  bedtime.    Dispense:  1 each    Refill:  5   albuterol (VENTOLIN HFA) 108 (90 Base) MCG/ACT inhaler    Sig: Inhale 2 puffs into the lungs every 6 (six) hours as needed for wheezing or shortness of breath.    Dispense:  18 g    Refill:  1    Patient Instructions  Continue spiriva daily. Continue flovent 2puffs twice per day.  If wheezing continues in next 2 weeks or still needing albuterol daily let me know and we can change the flovent to different inhaler.  Give me an update in 2 weeks. Return to the clinic or go to the nearest emergency room if any of your symptoms worsen or new symptoms occur.    Signed,   [name], MD [facility] 05/06/21 11:00 AM

## 2021-05-06 NOTE — Patient Instructions (Addendum)
Continue spiriva daily. Continue flovent 2puffs twice per day.  If wheezing continues in next 2 weeks or still needing albuterol daily let me know and we can change the flovent to different inhaler.  Give me an update in 2 weeks. Return to the clinic or go to the nearest emergency room if any of your symptoms worsen or new symptoms occur.

## 2021-05-13 ENCOUNTER — Other Ambulatory Visit: Payer: Self-pay | Admitting: Family Medicine

## 2021-05-13 DIAGNOSIS — R0609 Other forms of dyspnea: Secondary | ICD-10-CM

## 2021-05-13 DIAGNOSIS — R0600 Dyspnea, unspecified: Secondary | ICD-10-CM

## 2021-05-13 DIAGNOSIS — R062 Wheezing: Secondary | ICD-10-CM

## 2021-07-15 ENCOUNTER — Ambulatory Visit (INDEPENDENT_AMBULATORY_CARE_PROVIDER_SITE_OTHER): Payer: Medicare HMO

## 2021-07-15 DIAGNOSIS — Z0000 Encounter for general adult medical examination without abnormal findings: Secondary | ICD-10-CM

## 2021-07-15 NOTE — Patient Instructions (Signed)
Phillip Maynard , Thank you for taking time to come for your Medicare Wellness Visit. I appreciate your ongoing commitment to your health goals. Please review the following plan we discussed and let me know if I can assist you in the future.   Screening recommendations/referrals: Colonoscopy: no longer required  Recommended yearly ophthalmology/optometry visit for glaucoma screening and checkup Recommended yearly dental visit for hygiene and checkup  Vaccinations: Influenza vaccine: completed  Pneumococcal vaccine: completed  Tdap vaccine: 05/13/2017 Shingles vaccine: will consider     Advanced directives: none   Conditions/risks identified: none   Next appointment: none   Preventive Care 60 Years and Older, Male Preventive care refers to lifestyle choices and visits with your health care provider that can promote health and wellness. What does preventive care include? A yearly physical exam. This is also called an annual well check. Dental exams once or twice a year. Routine eye exams. Ask your health care provider how often you should have your eyes checked. Personal lifestyle choices, including: Daily care of your teeth and gums. Regular physical activity. Eating a healthy diet. Avoiding tobacco and drug use. Limiting alcohol use. Practicing safe sex. Taking low doses of aspirin every day. Taking vitamin and mineral supplements as recommended by your health care provider. What happens during an annual well check? The services and screenings done by your health care provider during your annual well check will depend on your age, overall health, lifestyle risk factors, and family history of disease. Counseling  Your health care provider may ask you questions about your: Alcohol use. Tobacco use. Drug use. Emotional well-being. Home and relationship well-being. Sexual activity. Eating habits. History of falls. Memory and ability to understand (cognition). Work and work  Statistician. Screening  You may have the following tests or measurements: Height, weight, and BMI. Blood pressure. Lipid and cholesterol levels. These may be checked every 5 years, or more frequently if you are over 91 years old. Skin check. Lung cancer screening. You may have this screening every year starting at age 52 if you have a 30-pack-year history of smoking and currently smoke or have quit within the past 15 years. Fecal occult blood test (FOBT) of the stool. You may have this test every year starting at age 64. Flexible sigmoidoscopy or colonoscopy. You may have a sigmoidoscopy every 5 years or a colonoscopy every 10 years starting at age 92. Prostate cancer screening. Recommendations will vary depending on your family history and other risks. Hepatitis C blood test. Hepatitis B blood test. Sexually transmitted disease (STD) testing. Diabetes screening. This is done by checking your blood sugar (glucose) after you have not eaten for a while (fasting). You may have this done every 1-3 years. Abdominal aortic aneurysm (AAA) screening. You may need this if you are a current or former smoker. Osteoporosis. You may be screened starting at age 77 if you are at high risk. Talk with your health care provider about your test results, treatment options, and if necessary, the need for more tests. Vaccines  Your health care provider may recommend certain vaccines, such as: Influenza vaccine. This is recommended every year. Tetanus, diphtheria, and acellular pertussis (Tdap, Td) vaccine. You may need a Td booster every 10 years. Zoster vaccine. You may need this after age 50. Pneumococcal 13-valent conjugate (PCV13) vaccine. One dose is recommended after age 54. Pneumococcal polysaccharide (PPSV23) vaccine. One dose is recommended after age 29. Talk to your health care provider about which screenings and vaccines you need and  how often you need them. This information is not intended to replace  advice given to you by your health care provider. Make sure you discuss any questions you have with your health care provider. Document Released: 08/28/2015 Document Revised: 04/20/2016 Document Reviewed: 06/02/2015 Elsevier Interactive Patient Education  2017 [place] Prevention in the Home Falls can cause injuries. They can happen to people of all ages. There are many things you can do to make your home safe and to help prevent falls. What can I do on the outside of my home? Regularly fix the edges of walkways and driveways and fix any cracks. Remove anything that might make you trip as you walk through a door, such as a raised step or threshold. Trim any bushes or trees on the path to your home. Use bright outdoor lighting. Clear any walking paths of anything that might make someone trip, such as rocks or tools. Regularly check to see if handrails are loose or broken. Make sure that both sides of any steps have handrails. Any raised decks and porches should have guardrails on the edges. Have any leaves, snow, or ice cleared regularly. Use sand or salt on walking paths during winter. Clean up any spills in your garage right away. This includes oil or grease spills. What can I do in the bathroom? Use night lights. Install grab bars by the toilet and in the tub and shower. Do not use towel bars as grab bars. Use non-skid mats or decals in the tub or shower. If you need to sit down in the shower, use a plastic, non-slip stool. Keep the floor dry. Clean up any water that spills on the floor as soon as it happens. Remove soap buildup in the tub or shower regularly. Attach bath mats securely with double-sided non-slip rug tape. Do not have throw rugs and other things on the floor that can make you trip. What can I do in the bedroom? Use night lights. Make sure that you have a light by your bed that is easy to reach. Do not use any sheets or blankets that are too big for your bed.  They should not hang down onto the floor. Have a firm chair that has side arms. You can use this for support while you get dressed. Do not have throw rugs and other things on the floor that can make you trip. What can I do in the kitchen? Clean up any spills right away. Avoid walking on wet floors. Keep items that you use a lot in easy-to-reach places. If you need to reach something above you, use a strong step stool that has a grab bar. Keep electrical cords out of the way. Do not use floor polish or wax that makes floors slippery. If you must use wax, use non-skid floor wax. Do not have throw rugs and other things on the floor that can make you trip. What can I do with my stairs? Do not leave any items on the stairs. Make sure that there are handrails on both sides of the stairs and use them. Fix handrails that are broken or loose. Make sure that handrails are as long as the stairways. Check any carpeting to make sure that it is firmly attached to the stairs. Fix any carpet that is loose or worn. Avoid having throw rugs at the top or bottom of the stairs. If you do have throw rugs, attach them to the floor with carpet tape. Make sure that you have  a light switch at the top of the stairs and the bottom of the stairs. If you do not have them, ask someone to add them for you. What else can I do to help prevent falls? Wear shoes that: Do not have high heels. Have rubber bottoms. Are comfortable and fit you well. Are closed at the toe. Do not wear sandals. If you use a stepladder: Make sure that it is fully opened. Do not climb a closed stepladder. Make sure that both sides of the stepladder are locked into place. Ask someone to hold it for you, if possible. Clearly mark and make sure that you can see: Any grab bars or handrails. First and last steps. Where the edge of each step is. Use tools that help you move around (mobility aids) if they are needed. These  include: Canes. Walkers. Scooters. Crutches. Turn on the lights when you go into a dark area. Replace any light bulbs as soon as they burn out. Set up your furniture so you have a clear path. Avoid moving your furniture around. If any of your floors are uneven, fix them. If there are any pets around you, be aware of where they are. Review your medicines with your doctor. Some medicines can make you feel dizzy. This can increase your chance of falling. Ask your doctor what other things that you can do to help prevent falls. This information is not intended to replace advice given to you by your health care provider. Make sure you discuss any questions you have with your health care provider. Document Released: 05/28/2009 Document Revised: 01/07/2016 Document Reviewed: 09/05/2014 Elsevier Interactive Patient Education  2017 [employer].

## 2021-07-15 NOTE — Progress Notes (Signed)
Subjective:   Phillip Maynard. is a 80 y.o. male who presents for an Subsequent Medicare Annual Wellness Visit.  Review of Systems     Cardiac Risk Factors include: advanced age (>66men, >67 women);dyslipidemia;hypertension;male gender     Objective:    Today's Vitals   There is no height or weight on file to calculate BMI.  Advanced Directives 07/15/2021 12/25/2019 04/04/2019 01/27/2019 11/19/2018 04/22/2018 04/01/2018  Does Patient Have a Medical Advance Directive? No No No No No No No  Would patient like information on creating a medical advance directive? No - Patient declined Yes (ED - Information included in AVS) No - Patient declined No - Patient declined No - Patient declined No - Patient declined No - Patient declined    Current Medications (verified) Outpatient Encounter Medications as of 07/15/2021  Medication Sig   albuterol (VENTOLIN HFA) 108 (90 Base) MCG/ACT inhaler Inhale 2 puffs into the lungs every 6 (six) hours as needed for wheezing or shortness of breath.   atorvastatin (LIPITOR) 10 MG tablet Take 1 tablet (10 mg total) by mouth daily.   finasteride (PROSCAR) 5 MG tablet Take 1 tablet (5 mg total) by mouth daily.   fluticasone (FLOVENT HFA) 110 MCG/ACT inhaler Inhale 1-2 puffs into the lungs in the morning and at bedtime.   losartan-hydrochlorothiazide (HYZAAR) 50-12.5 MG tablet Take 1 tablet by mouth daily.   sildenafil (REVATIO) 20 MG tablet Take 1 tablet (20 mg total) by mouth as needed.   tamsulosin (FLOMAX) 0.4 MG CAPS capsule Take 2 capsules (0.8 mg total) by mouth at bedtime.   tiotropium (SPIRIVA HANDIHALER) 18 MCG inhalation capsule Place 1 capsule (18 mcg total) into inhaler and inhale daily.   No facility-administered encounter medications on file as of 07/15/2021.    Allergies (verified) Penicillins and Shellfish allergy   History: Past Medical History:  Diagnosis Date   Asthma    "when I was a boy" (04/08/2013)   Bronchitis    Colon polyps     Colovesical fistula    Diverticulosis    GERD (gastroesophageal reflux disease)    Hepatitis C    treated with injections   High cholesterol    Hypertension    Type II diabetes mellitus (HCC)    hx of . No longer on medicine   Past Surgical History:  Procedure Laterality Date   COLONOSCOPY  last 09/06/2016   CYSTOSCOPY N/A 09/08/2016   Procedure: FIREFLY INJECTIONS;  Surgeon: Cleon Gustin, MD;  Location: [facility];  Service: Urology;  Laterality: N/A;   INCISION AND DRAINAGE OF WOUND Right 1970's   "leg" (04/08/2013)   LIVER BIOPSY  ~ 2011   POLYPECTOMY     PROCTOSCOPY N/A 09/08/2016   Procedure: RIGID PROCTOSCOPY;  Surgeon: Michael Boston, MD;  Location: [facility];  Service: General;  Laterality: N/A;   TONSILLECTOMY     Family History  Problem Relation Age of Onset   Asthma Mother    Heart attack Mother    Colon polyps Neg Hx    Colon cancer Neg Hx    Esophageal cancer Neg Hx    Rectal cancer Neg Hx    Stomach cancer Neg Hx    Social History   Socioeconomic History   Marital status: Married    Spouse name: Not on file   Number of children: 2   Years of education: Not on file   Highest education level: Not on file  Occupational History   Occupation: truck driver  Employer: [employer]    Comment: retired  Tobacco Use   Smoking status: Former    Packs/day: 0.25    Years: 45.00    Pack years: 11.25    Types: Cigarettes    Quit date: 03/05/2021    Years since quitting: 0.3   Smokeless tobacco: Never  Vaping Use   Vaping Use: Never used  Substance and Sexual Activity   Alcohol use: Yes    Alcohol/week: 12.0 standard drinks    Types: 12 Cans of beer per week    Comment: 12 pack a week.   Drug use: No   Sexual activity: Yes  Other Topics Concern   Not on file  Social History Narrative   Pt lives in 1 story home with his wife   Has 2 adult children   Highest level of education: GED & Trade school   Retired Media planner.    Social Determinants  of Health   Financial Resource Strain: Low Risk    Difficulty of Paying Living Expenses: Not hard at all  Food Insecurity: No Food Insecurity   Worried About Charity fundraiser in the Last Year: Never true   [place] in the Last Year: Never true  Transportation Needs: No Transportation Needs   Lack of Transportation (Medical): No   Lack of Transportation (Non-Medical): No  Physical Activity: Inactive   Days of Exercise per Week: 0 days   Minutes of Exercise per Session: 0 min  Stress: No Stress Concern Present   Feeling of Stress : Not at all  Social Connections: Moderately Integrated   Frequency of Communication with Friends and Family: Twice a week   Frequency of Social Gatherings with Friends and Family: Twice a week   Attends Religious Services: 1 to 4 times per year   Active Member of [employer] or Organizations: No   Attends Music therapist: Never   Marital Status: Married    Tobacco Counseling Counseling given: Not Answered   Clinical Intake:  Pre-visit preparation completed: Yes  Pain : No/denies pain     Nutritional Risks: None Diabetes: No  How often do you need to have someone help you when you read instructions, pamphlets, or other written materials from your doctor or pharmacy?: 1 - Never What is the last grade level you completed in school?: college  Diabetic?no   Interpreter Needed?: No  Information entered by :: [place] of Daily Living In your present state of health, do you have any difficulty performing the following activities: 07/15/2021  Hearing? N  Vision? N  Difficulty concentrating or making decisions? N  Walking or climbing stairs? N  Dressing or bathing? N  Doing errands, shopping? N  Preparing Food and eating ? N  Using the Toilet? N  In the past six months, have you accidently leaked urine? N  Do you have problems with loss of bowel control? N  Managing your Medications? N  Managing your  Finances? N  Housekeeping or managing your Housekeeping? N  Some recent data might be hidden    Patient Care Team: Wendie Agreste, MD as PCP - General (Family Medicine) Nickie Retort, MD (Inactive) as Consulting Physician (Urology) Michael Boston, MD as Consulting Physician (General Surgery) Mauri Pole, MD as Consulting Physician (Gastroenterology)  Indicate any recent Medical Services you may have received from other than [facility] providers in the past year (date may be approximate).     Assessment:  This is a routine wellness examination for Hamdan.  Hearing/Vision screen Vision Screening - Comments:: Annual eye exams wear glasses   Dietary issues and exercise activities discussed: Current Exercise Habits: Home exercise routine, Type of exercise: walking, Time (Minutes): 30, Frequency (Times/Week): 3, Weekly Exercise (Minutes/Week): 90, Intensity: Mild, Exercise limited by: None identified   Goals Addressed   None    Depression Screen PHQ 2/9 Scores 07/15/2021 07/15/2021 05/06/2021 04/07/2021 03/10/2021 10/07/2020 07/06/2020  PHQ - 2 Score 0 0 0 0 0 0 0  PHQ- 9 Score - - - - - - -    Fall Risk Fall Risk  07/15/2021 05/06/2021 04/07/2021 03/10/2021 10/07/2020  Falls in the past year? 0 0 0 0 0  Number falls in past yr: - 0 0 0 -  Injury with Fall? 0 0 0 0 -  Risk for fall due to : - No Fall Risks No Fall Risks No Fall Risks -  Follow up Falls evaluation completed Falls evaluation completed Falls evaluation completed Falls evaluation completed Falls evaluation completed    [place]:  Any stairs in or around the home? No  If so, are there any without handrails? No  Home free of loose throw rugs in walkways, pet beds, electrical cords, etc? Yes  Adequate lighting in your home to reduce risk of falls? Yes   ASSISTIVE DEVICES UTILIZED TO PREVENT FALLS:  Life alert? No  Use of a cane, walker or w/c? No  Grab bars in the bathroom? No   Shower chair or bench in shower? No  Elevated toilet seat or a handicapped toilet? No    Cognitive Function:  Normal cognitive status assessed by direct observation by this Nurse Health Advisor. No abnormalities found.   Montreal Cognitive Assessment  08/04/2017  Visuospatial/ Executive (0/5) 5  Naming (0/3) 3  Attention: Read list of digits (0/2) 2  Attention: Read list of letters (0/1) 1  Attention: Serial 7 subtraction starting at 100 (0/3) 2  Language: Repeat phrase (0/2) 2  Language : Fluency (0/1) 1  Abstraction (0/2) 2  Delayed Recall (0/5) 5  Orientation (0/6) 6  Total 29   6CIT Screen 12/25/2019 11/19/2018 05/02/2017  What Year? 0 points 0 points 0 points  What month? 0 points 0 points 0 points  What time? 0 points 0 points 0 points  Count back from 20 0 points 0 points 0 points  Months in reverse 0 points 0 points 0 points  Repeat phrase 0 points 0 points 0 points  Total Score 0 0 0    Immunizations Immunization History  Administered Date(s) Administered   Fluad Quad(high Dose 65+) 05/06/2019, 05/04/2020, 05/06/2021   Influenza, High Dose Seasonal PF 09/05/2018   Influenza,inj,Quad PF,6+ Mos 05/27/2013, 09/22/2014, 05/23/2016   PFIZER(Purple Top)SARS-COV-2 Vaccination 10/06/2019, 10/30/2019   Pneumococcal Conjugate-13 01/26/2015   Pneumococcal Polysaccharide-23 01/18/2010   Pneumococcal-Unspecified 04/16/2011   Tdap 06/13/2011, 05/13/2017    TDAP status: Up to date  Flu Vaccine status: Up to date  Pneumococcal vaccine status: Up to date  Covid-19 vaccine status: Completed vaccines  Qualifies for Shingles Vaccine? Yes   Zostavax completed No   Shingrix Completed?: No.    Education has been provided regarding the importance of this vaccine. Patient has been advised to call insurance company to determine out of pocket expense if they have not yet received this vaccine. Advised may also receive vaccine at local pharmacy or Health Dept. Verbalized acceptance  and understanding.  Screening  Tests Health Maintenance  Topic Date Due   Zoster Vaccines- Shingrix (1 of 2) Never done   COVID-19 Vaccine (3 - Booster for Pfizer series) 12/25/2019   FOOT EXAM  04/06/2021   OPHTHALMOLOGY EXAM  04/06/2021   HEMOGLOBIN A1C  10/08/2021   TETANUS/TDAP  05/14/2027   Pneumonia Vaccine 3+ Years old  Completed   INFLUENZA VACCINE  Completed   Hepatitis C Screening  Completed   HPV VACCINES  Aged Out   COLONOSCOPY (Pts 45-65yrs Insurance coverage will need to be confirmed)  Discontinued    Health Maintenance  Health Maintenance Due  Topic Date Due   Zoster Vaccines- Shingrix (1 of 2) Never done   COVID-19 Vaccine (3 - Booster for Pfizer series) 12/25/2019   FOOT EXAM  04/06/2021   OPHTHALMOLOGY EXAM  04/06/2021    Colorectal cancer screening: No longer required.   Lung Cancer Screening: (Low Dose CT Chest recommended if Age 63-80 years, 30 pack-year currently smoking OR have quit w/in 15years.) does not qualify.   Lung Cancer Screening Referral: n/a  Additional Screening:  Hepatitis C Screening: does not qualify;   Vision Screening: Recommended annual ophthalmology exams for early detection of glaucoma and other disorders of the eye. Is the patient up to date with their annual eye exam?  Yes  Who is the provider or what is the name of the office in which the patient attends annual eye exams? Dr.McFarland  If pt is not established with a provider, would they like to be referred to a provider to establish care? No .   Dental Screening: Recommended annual dental exams for proper oral hygiene  Community Resource Referral / Chronic Care Management: CRR required this visit?  No   CCM required this visit?  No      Plan:     I have personally reviewed and noted the following in the patient's chart:   Medical and social history Use of alcohol, tobacco or illicit drugs  Current medications and supplements including opioid prescriptions.  Patient is not currently taking opioid prescriptions. Functional ability and status Nutritional status Physical activity Advanced directives List of other physicians Hospitalizations, surgeries, and ER visits in previous 12 months Vitals Screenings to include cognitive, depression, and falls Referrals and appointments  In addition, I have reviewed and discussed with patient certain preventive protocols, quality metrics, and best practice recommendations. A written personalized care plan for preventive services as well as general preventive health recommendations were provided to patient.     Randel Pigg, LPN   [id number]   Nurse Notes: none

## 2021-07-24 ENCOUNTER — Other Ambulatory Visit: Payer: Self-pay | Admitting: Family Medicine

## 2021-07-24 DIAGNOSIS — E785 Hyperlipidemia, unspecified: Secondary | ICD-10-CM

## 2021-08-14 ENCOUNTER — Telehealth: Payer: Medicare HMO | Admitting: Physician Assistant

## 2021-08-14 DIAGNOSIS — Z20822 Contact with and (suspected) exposure to covid-19: Secondary | ICD-10-CM | POA: Diagnosis not present

## 2021-08-14 DIAGNOSIS — Z5321 Procedure and treatment not carried out due to patient leaving prior to being seen by health care provider: Secondary | ICD-10-CM

## 2021-08-14 DIAGNOSIS — U071 COVID-19: Secondary | ICD-10-CM | POA: Diagnosis not present

## 2021-08-14 NOTE — Progress Notes (Signed)
Attempted to connect with patient via video and phone multiple times. He and wife are on video but cannot hear provider and are not seemingly able to use the chat function. Called patient x 4 via cell phone with no answer. Called wife x 2 (on DPR) with no answer or VM. Called home number listed x 2 but with message saying it had been disconnected. Sent new links for video and attempted video again x 2 without any lee way. Will wait to see if they return call.

## 2021-08-17 ENCOUNTER — Telehealth: Payer: Self-pay | Admitting: Family Medicine

## 2021-08-17 NOTE — Telephone Encounter (Signed)
Chief Complaint Cough Reason for Call Symptomatic / Request for Health Information Initial Comment Caller states dx pos for COVID19; wants meds; cough; wife and dtr also tested pos; Translation No Nurse Assessment Nurse: Mariel Sleet, RN, Erasmo Downer Date/Time (Eastern Time): 08/15/2021 9:35:04 AM Confirm and document reason for call. If symptomatic, describe symptoms. ---Caller states he tested positive for covid yesterday, has cough and runny nose. Does the patient have any new or worsening symptoms? ---Yes Will a triage be completed? ---Yes Related visit to physician within the last 2 weeks? ---No Does the PT have any chronic conditions? (i.e. diabetes, asthma, this includes High risk factors for pregnancy, etc.) ---Yes List chronic conditions. ---diabetic Is this a behavioral health or substance abuse call? ---No Guidelines Guideline Title Affirmed Question Affirmed Notes Nurse Date/Time (Eastern Time) COVID-19 - Diagnosed or Suspected [1] HIGH RISK for severe COVID complications (e.g., weak immune system, age > 52 years, obesity with BMI 30 or higher, pregnant, chronic lung disease or other Mariel Sleet, RN, Erasmo Downer 08/15/2021 9:36:23 AM PLEASE NOTE: All timestamps contained within this report are represented as [place] Standard Time. CONFIDENTIALTY NOTICE: This fax transmission is intended only for the addressee. It contains information that is legally privileged, confidential or otherwise protected from use or disclosure. If you are not the intended recipient, you are strictly prohibited from reviewing, disclosing, copying using or disseminating any of this information or taking any action in reliance on or regarding this information. If you have received this fax in error, please notify us immediately by telephone so that we can arrange for its return to [place]. Phone: [phone], Toll-Free: [phone], Fax: [phone] Page: 2 of 2 Call Id: [id number] Guidelines Guideline Title  Affirmed Question Affirmed Notes Nurse Date/Time Eilene Ghazi Time) chronic medical condition) AND [2] COVID symptoms (e.g., cough, fever) (Exceptions: Already seen by PCP and no new or worsening symptoms.) Disp. Time Eilene Ghazi Time) Disposition Final User 08/15/2021 9:40:02 AM Call PCP within 24 Hours Yes Mariel Sleet, RN, Laurin Coder Disagree/Comply Comply Caller Understands Yes PreDisposition Call Doctor Care Advice Given Per Guideline CALL PCP WITHIN 24 HOURS: * IF OFFICE WILL BE OPEN: Call the office when it opens tomorrow morning. CALL BACK IF: * You become worse CARE ADVICE given per COVID-19 - DIAGNOSED OR SUSPECTED (Adult) guideline

## 2021-08-17 NOTE — Telephone Encounter (Signed)
Pt needs an appt

## 2021-08-18 NOTE — Telephone Encounter (Signed)
Patient needs appointment for evaluation. The virtual visit that was reported to team health was no showed and Phillip Maynard was not able to evaluate.

## 2021-08-18 NOTE — Telephone Encounter (Signed)
LM asking asking pt to call back to schedule an appt if he is still having sxs

## 2021-08-21 DIAGNOSIS — E119 Type 2 diabetes mellitus without complications: Secondary | ICD-10-CM | POA: Diagnosis not present

## 2021-09-17 ENCOUNTER — Emergency Department (HOSPITAL_COMMUNITY)
Admission: EM | Admit: 2021-09-17 | Discharge: 2021-09-17 | Disposition: A | Discharge status: Home / Self Care | Payer: Medicare (Managed Care) | Attending: Emergency Medicine | Admitting: Emergency Medicine

## 2021-09-17 ENCOUNTER — Emergency Department (HOSPITAL_COMMUNITY): Payer: Medicare (Managed Care)

## 2021-09-17 ENCOUNTER — Encounter (HOSPITAL_COMMUNITY): Payer: Self-pay

## 2021-09-17 ENCOUNTER — Other Ambulatory Visit: Payer: Self-pay

## 2021-09-17 DIAGNOSIS — R7309 Other abnormal glucose: Secondary | ICD-10-CM | POA: Diagnosis not present

## 2021-09-17 DIAGNOSIS — I517 Cardiomegaly: Secondary | ICD-10-CM | POA: Diagnosis not present

## 2021-09-17 DIAGNOSIS — J9811 Atelectasis: Secondary | ICD-10-CM | POA: Insufficient documentation

## 2021-09-17 DIAGNOSIS — R0602 Shortness of breath: Secondary | ICD-10-CM

## 2021-09-17 DIAGNOSIS — J449 Chronic obstructive pulmonary disease, unspecified: Secondary | ICD-10-CM | POA: Insufficient documentation

## 2021-09-17 LAB — BASIC METABOLIC PANEL
Anion gap: 7 (ref 5–15)
BUN: 15 mg/dL (ref 8–23)
CO2: 26 mmol/L (ref 22–32)
Calcium: 9.4 mg/dL (ref 8.9–10.3)
Chloride: 101 mmol/L (ref 98–111)
Creatinine, Ser: 0.97 mg/dL (ref 0.61–1.24)
GFR, Estimated: >60 mL/min (ref >60)
Glucose, Bld: 151 mg/dL — ABNORMAL HIGH (ref 70–99)
Potassium: 3.7 mmol/L (ref 3.5–5.1)
Sodium: 134 mmol/L — ABNORMAL LOW (ref 135–145)

## 2021-09-17 LAB — CBC
HCT: 44.3 % (ref 39.0–52.0)
Hemoglobin: 14.6 g/dL (ref 13.0–17.0)
MCH: 30.4 pg (ref 26.0–34.0)
MCHC: 33 g/dL (ref 30.0–36.0)
MCV: 92.1 fL (ref 80.0–100.0)
Platelets: 253 10*3/uL (ref 150–400)
RBC: 4.81 MIL/uL (ref 4.22–5.81)
RDW: 14.1 % (ref 11.5–15.5)
WBC: 8.7 10*3/uL (ref 4.0–10.5)
nRBC: 0 % (ref 0.0–0.2)

## 2021-09-17 LAB — BRAIN NATRIURETIC PEPTIDE: B Natriuretic Peptide: 37.4 pg/mL (ref 0.0–100.0)

## 2021-09-17 NOTE — ED Notes (Signed)
X 1 blue save tube sent to lab.  °

## 2021-09-17 NOTE — ED Triage Notes (Addendum)
Patient arrives from home with complaint of SOB. Pt states he has emphysema and is always SOB, but tested COVID positive on 12/31 (was treated) and would like a check up. States his PCP office was full so he came to ED. Denies any new or worsening symptoms, states SOB is same as baseline.

## 2021-09-17 NOTE — Discharge Instructions (Addendum)
Contact a health care provider if: Your condition does not improve as soon as expected. You have a hard time doing your normal activities, even after you rest. You have new symptoms. You cannot walk up stairs or exercise the way that you normally do. Get help right away if: Your shortness of breath gets worse. You have shortness of breath when you are resting. You feel light-headed or you faint. You have a cough that is not controlled with medicines. You cough up blood. You have pain with breathing. You have pain in your chest, arms, shoulders, or abdomen. You have a fever.

## 2021-09-17 NOTE — ED Provider Notes (Signed)
[facility] DEPT Provider Note   CSN: [id number] Arrival date & time: 09/17/21  0122     History  Chief Complaint  Patient presents with   Shortness of Breath    Phillip Maynard. is a 81 y.o. male with past medical history of COPD who presents emergency department for shortness of breath.  Patient states that he had COVID back at the beginning of January.  He was treated.  He has been trying to get back into see his primary care doctor but they are booked out for a few months and he has not been able to "get a checkup" since he was diagnosed with COVID-19.  He has no new complaints.  He has been having shortness of breath which is what worse at night.  This is positive for coughing and wheezing and relieved by his albuterol inhaler.  This was going on prior to his diagnosis of COVID-19 and has not changed since he got it.  He denies orthopnea, PND, exertional dyspnea or chest pain.   Shortness of Breath     Home Medications Prior to Admission medications   Medication Sig Start Date End Date Taking? Authorizing Provider  albuterol (VENTOLIN HFA) 108 (90 Base) MCG/ACT inhaler Inhale 2 puffs into the lungs every 6 (six) hours as needed for wheezing or shortness of breath. 05/06/21   Wendie Agreste, MD  atorvastatin (LIPITOR) 10 MG tablet TAKE 1 TABLET EVERY DAY 07/26/21   Wendie Agreste, MD  finasteride (PROSCAR) 5 MG tablet Take 1 tablet (5 mg total) by mouth daily. 01/13/21   McKenzie, Candee Furbish, MD  fluticasone (FLOVENT HFA) 110 MCG/ACT inhaler Inhale 1-2 puffs into the lungs in the morning and at bedtime. 05/06/21   Wendie Agreste, MD  losartan-hydrochlorothiazide (HYZAAR) 50-12.5 MG tablet Take 1 tablet by mouth daily. 05/06/21   Wendie Agreste, MD  sildenafil (REVATIO) 20 MG tablet Take 1 tablet (20 mg total) by mouth as needed. 01/13/21   McKenzie, Candee Furbish, MD  tamsulosin (FLOMAX) 0.4 MG CAPS capsule Take 2 capsules (0.8 mg total) by mouth at  bedtime. 01/13/21   McKenzie, Candee Furbish, MD  tiotropium (SPIRIVA HANDIHALER) 18 MCG inhalation capsule Place 1 capsule (18 mcg total) into inhaler and inhale daily. 04/07/21   Wendie Agreste, MD      Allergies    Penicillins and Shellfish allergy    Review of Systems   Review of Systems  Respiratory:  Positive for shortness of breath.    Physical Exam Updated Vital Signs BP (!) 158/76 (BP Location: Left Arm)    Pulse 76    Temp 97.9 F (36.6 C) (Oral)    Resp 18    Ht 5\' 9"  (1.753 m)    Wt 83.9 kg    SpO2 95%    BMI 27.32 kg/m  Physical Exam Vitals and nursing note reviewed.  Constitutional:      General: He is not in acute distress.    Appearance: He is well-developed. He is not diaphoretic.  HENT:     Head: Normocephalic and atraumatic.  Eyes:     General: No scleral icterus.    Conjunctiva/sclera: Conjunctivae normal.  Cardiovascular:     Rate and Rhythm: Normal rate and regular rhythm.     Heart sounds: Normal heart sounds.  Pulmonary:     Effort: Pulmonary effort is normal. No respiratory distress.     Breath sounds: Examination of the right-middle field reveals rhonchi. Examination  of the left-middle field reveals rhonchi. Examination of the right-lower field reveals rhonchi. Examination of the left-lower field reveals rhonchi. Rhonchi present. No wheezing.  Abdominal:     Palpations: Abdomen is soft.     Tenderness: There is no abdominal tenderness.  Musculoskeletal:     Cervical back: Normal range of motion and neck supple.  Skin:    General: Skin is warm and dry.  Neurological:     Mental Status: He is alert.  Psychiatric:        Behavior: Behavior normal.    ED Results / Procedures / Treatments   Labs (all labs ordered are listed, but only abnormal results are displayed) Labs Reviewed  CBC  BASIC METABOLIC PANEL  BRAIN NATRIURETIC PEPTIDE    EKG None  Radiology DG Chest Port 1 View  Result Date: 09/17/2021 CLINICAL DATA:  Shortness of breath. EXAM:  PORTABLE CHEST 1 VIEW COMPARISON:  03/04/2021. FINDINGS: The heart is enlarged and the mediastinal contour is stable. Mild atherosclerotic calcification of the aorta. Lung volumes are low and atelectasis is noted at the lung bases. Chronic interstitial prominence is noted bilaterally. No effusion or pneumothorax. No acute osseous abnormality. IMPRESSION: 1. Cardiomegaly. 2. Low lung volumes with mild atelectasis at the lung bases. Electronically Signed   By: Brett Fairy M.D.   On: 09/17/2021 02:01    Procedures Procedures    Medications Ordered in ED Medications - No data to display  ED Course/ Medical Decision Making/ A&P Clinical Course as of 09/17/21 0240  Fri Sep 17, 2021  0239 CBC [AH]  0239 Brain natriuretic peptide [AH]  7673 Basic metabolic panel(!) Labs without significant finding, mild hyperglycemia [AH]  0239 DG Chest [facility] I interpreted images of portable 1 view chest x-ray which shows low lung volumes, no evidence of infiltrate or edema [AH]  0239 EKG unchanged from previous tracing [AH]  0240 ED EKG [AH]    Clinical Course User Index [AH] Margarita Mail, PA-C                           Medical Decision Making Patient here with complaint of shortness of breath, wanting a well check after having COVID-19.  Symptoms are unchanged since prior to getting COVID-19.\The emergent differential diagnosis for shortness of breath includes, but is not limited to, Pulmonary edema, bronchoconstriction, Pneumonia, Pulmonary embolism, Pneumotherax/ Hemothorax, Dysrythmia, ACS.    After review there are no acute findings.  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  .  He is encouraged to follow closely with his primary care physician  Amount and/or Complexity of Data Reviewed Labs: ordered. Radiology: ordered.    Final Clinical Impression(s) / ED Diagnoses Final diagnoses:  SOB (shortness of breath)    Rx /  DC Orders ED Discharge Orders     None         Margarita Mail, PA-C 09/17/21 0243    Maudie Flakes, MD 09/17/21 [phone]

## 2021-09-29 ENCOUNTER — Ambulatory Visit (INDEPENDENT_AMBULATORY_CARE_PROVIDER_SITE_OTHER): Payer: Medicare (Managed Care) | Admitting: Family Medicine

## 2021-09-29 ENCOUNTER — Telehealth: Payer: Self-pay | Admitting: Family Medicine

## 2021-09-29 VITALS — BP 126/74 | HR 56 | Temp 98.2°F | Resp 16 | Ht 69.0 in | Wt 189.8 lb

## 2021-09-29 DIAGNOSIS — J441 Chronic obstructive pulmonary disease with (acute) exacerbation: Secondary | ICD-10-CM

## 2021-09-29 DIAGNOSIS — I10 Essential (primary) hypertension: Secondary | ICD-10-CM

## 2021-09-29 DIAGNOSIS — E785 Hyperlipidemia, unspecified: Secondary | ICD-10-CM

## 2021-09-29 DIAGNOSIS — R062 Wheezing: Secondary | ICD-10-CM

## 2021-09-29 DIAGNOSIS — E1165 Type 2 diabetes mellitus with hyperglycemia: Secondary | ICD-10-CM

## 2021-09-29 LAB — HEMOGLOBIN A1C: Hgb A1c MFr Bld: 7.8 % — ABNORMAL HIGH (ref 4.6–6.5)

## 2021-09-29 MED ORDER — BLOOD GLUCOSE METER KIT: PACK | 0 refills | Status: AC

## 2021-09-29 MED ORDER — PREDNISONE 20 MG PO TABS: 40.0000 mg | ORAL_TABLET | Freq: Every day | ORAL | 0 refills | Status: DC

## 2021-09-29 MED ORDER — BENZONATATE 100 MG PO CAPS: 100.0000 mg | ORAL_CAPSULE | Freq: Three times a day (TID) | ORAL | 0 refills | Status: DC | PRN

## 2021-09-29 MED ORDER — AZITHROMYCIN 250 MG PO TABS: ORAL_TABLET | ORAL | 0 refills | Status: AC

## 2021-09-29 MED ORDER — LOSARTAN POTASSIUM-HCTZ 50-12.5 MG PO TABS: 1.0000 | ORAL_TABLET | Freq: Every day | ORAL | 1 refills | Status: DC

## 2021-09-29 NOTE — Patient Instructions (Addendum)
Prednisone, zpak for COPD/cough. Be seen if any return of fever or not improving next few days.  Check blood sugar once per day - if over 200 let me know. Prednisone may increase readings temporarily.  Tylenol is safer than advil for sore muscles  - try to take that instead. Tessalon perles if needed for cough, but other new meds should help.   Return to the clinic or go to the nearest emergency room if any of your symptoms worsen or new symptoms occur.  Chronic Obstructive Pulmonary Disease Exacerbation Chronic obstructive pulmonary disease (COPD) is a long-term (chronic) lung problem. In COPD, the flow of air from the lungs is limited. COPD exacerbations are times that breathing gets worse and you need more than your normal treatment. Without treatment, they can be life-threatening. If they happen often, your lungs can become more damaged. What are the causes? Having infections that affect your airways and lungs. Being exposed to: Smoke. Air pollution. Chemical fumes. Dust. Things that can cause an allergic reaction (allergens). Not taking your usual COPD medicines as told. Having medical problems already, such as heart failure or infections not involving the lungs. In many cases, the cause is not known. What increases the risk? Smoking. Being an older adult. Having frequent prior COPD exacerbations. What are the signs or symptoms? Increased coughing. Increased mucus from your lungs. Increased wheezing. Increased shortness of breath. Fast breathing and finding it hard to breathe. Chest tightness. Less energy than usual. Sleep disruption from symptoms. Confusion. Increased sleepiness. Often, these symptoms happen or get worse even with the use of medicines. How is this treated? Treatment for this condition depends on how bad it is and the cause of the symptoms. You may need to stay in the hospital for treatment. Treatment may include: Taking medicines. Using oxygen. Being  treated with different ways to clear your airway, such as using a mask to deliver oxygen. Follow these instructions at home: Medicines Take over-the-counter and prescription medicines only as told by your doctor. Use all inhaled medicines the correct way. If you were prescribed an antibiotic or steroid medicine, take it as told by your doctor. Do not stop taking it even if you start to feel better. Lifestyle Do not smoke or use any products that contain nicotine or tobacco. If you need help quitting, ask your doctor. Eat healthy foods. Exercise regularly. Get enough sleep. Most adults need 7 or more hours per night. Avoid tobacco smoke and other things that can bother your lungs. Several times a day, wash your hands with soap and water for at least 20 seconds. If you cannot use soap and water, use hand sanitizer. This may help keep you from getting an infection. During flu season, avoid areas that are crowded with people. General instructions Drink enough fluid to keep your pee (urine) pale yellow. Do not do this if your doctor has told you not to. Use a cool mist machine (vaporizer). If you use oxygen or a machine that turns medicine into a mist (nebulizer), continue to use it as told. Keep all follow-up visits. How is this prevented? Keep up with shots (vaccinations) as told by your doctor. Be sure to get a yearly flu (influenza) shot. If you smoke, quit smoking. Smoking makes the problem worse. Follow all instructions for rehabilitation. These are steps you can take to make your body work better. Work with your doctor to develop and follow an action plan. This tells you what steps to take when you experience certain  symptoms. Contact a doctor if: Your COPD symptoms get worse than normal. Get help right away if: You are short of breath and it gets worse, even when you are resting. You have trouble talking. You have chest pain. You cough up blood. You have a fever. You keep  vomiting. You feel weak or you pass out (faint). You feel confused. You are not able to sleep because of your symptoms. You have trouble doing daily activities. These symptoms may be an emergency. Get help right away. Call your local emergency services (911 in the [place] Do not wait to see if the symptoms will go away. Do not drive yourself to the hospital. Summary COPD exacerbations are times that breathing gets worse and you need more treatment than normal. COPD exacerbations can be very serious and may cause your lungs to become more damaged. Do not smoke. If you need help quitting, ask your doctor. Stay up to date on your shots. Get a flu shot every year. This information is not intended to replace advice given to you by your health care provider. Make sure you discuss any questions you have with your health care provider. Document Revised: 06/24/2020 Document Reviewed: 06/09/2020 Elsevier Patient Education  2022 [employer].

## 2021-09-29 NOTE — Telephone Encounter (Signed)
I have placed a medical clearance request in the bin upfront with a charge sheet.

## 2021-09-29 NOTE — Progress Notes (Signed)
Subjective:  Patient ID: Phillip Barrette., male    DOB: Dec 13, 1940  Age: 81 y.o. MRN: [id number]  CC:  Chief Complaint  Patient presents with   Hypertension    Pt here for recheck and refill on medication   Cough    Pt had COVID first of the year notes other sxs have resolved but still has cough notes theres a wheezing component to cough, notes also some side pain post coughing fits    Diabetes    Pt due for recheck no concerns from pt     HPI Phillip Maynard. presents for   Hypertension: Losartan hct 50/12.35m qd. No new side effects.  Home readings: 129-130/70 range BP Readings from Last 3 Encounters:  09/29/21 126/74  09/17/21 (!) 152/70  05/06/21 132/78   Lab Results  Component Value Date   CREATININE 0.97 09/17/2021   Hyperlipidemia: Lipitor 162mqd. No new myalgias.  Lab Results  Component Value Date   CHOL 159 04/07/2021   HDL 44.90 04/07/2021   LDLCALC 75 10/07/2020   LDLDIRECT 71.0 04/07/2021   TRIG 207.0 (H) 04/07/2021   CHOLHDL 4 04/07/2021   Lab Results  Component Value Date   ALT 13 04/07/2021   AST 17 04/07/2021   ALKPHOS 56 04/07/2021   BILITOT 0.8 04/07/2021   COPD COVID-19 infection in December. Last discussed meds in September.  Some confusion previously regarding his medications.  Had not yet started Spiriva.  Was using albuterol multiple times per day, Flovent 1 puff twice daily.  Was improved at September visit using Spiriva daily.  Flovent 2 puffs twice daily.  Less need for albuterol 1-2 times per day but not every day.  Option to change to Advair if persistent need for albuterol. ED visit noted from February 3.  Presented for shortness of breath.  Thought to be a COPD/chronic bronchitis.  His chest x-ray showed low lung volumes but no evidence of infiltrate or edema.  EKG was unchanged from previous tracing.  Labs without significant finding, mild hyperglycemia.  BNP was normal. More cough recently - more in past few days. Increased  mucus, thick clear mucus, and more wheezing past few days. Using albuterol 2-3 times per day - last dose last night.  Temp 100.4 few nights ago. Normal last night. Grandkids sick recently. Cough and pink eye. Sore with cough. Taking advil once at night.   Diabetes: Slight elevation in A1c in August.  Diet monitoring, exercise discussed.  Due for recheck labs. Needs new meter - no home readings.  No current meds.  Microalbumin: 5.2 with normal ratio 04/07/2021.  He does take ARB, and on statin. Optho, foot exam, pneumovax:  Had [place]tho eval - [place].   Lab Results  Component Value Date   HGBA1C 7.5 (H) 04/07/2021   HGBA1C 7.0 (H) 10/07/2020   HGBA1C 7.0 (H) 07/06/2020   Lab Results  Component Value Date   MICROALBUR 5.2 (H) 04/07/2021   L[place]5 10/07/2020   CREATININE 0.97 09/17/2021    History Patient Active Problem List   Diagnosis Date Noted   Benign localized prostatic hyperplasia with lower urinary tract symptoms (LUTS) 01/13/2021   Acute cystitis with hematuria 01/13/2021   Nocturia 01/13/2021   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/13/2021   Skin lesion 10/12/2018   Colovesical fistula s/p robotic colectomy/repair 09/08/2016 07/12/2016   Hypertension 02/28/2013   Type 2 diabetes mellitus ([place]07/17/2014   Chronic hepatitis  C ([place]) 02/14/2011   TOBACCO ABUSE 12/10/2007   MERALGIA PARESTHETICA 12/10/2007   Past Medical History:  Diagnosis Date   Asthma    "when I was a boy" (04/08/2013)   Bronchitis    Colon polyps    Colovesical fistula    Diverticulosis    GERD (gastroesophageal reflux disease)    Hepatitis C    treated with injections   High cholesterol    Hypertension    Type II diabetes mellitus (HCC)    hx of . No longer on medicine   Past Surgical History:  Procedure Laterality Date   COLONOSCOPY  last 09/06/2016   CYSTOSCOPY N/A 09/08/2016   Procedure: FIREFLY INJECTIONS;  Surgeon: Cleon Gustin, MD;  Location: [facility];  Service: Urology;  Laterality: N/A;   INCISION AND DRAINAGE OF WOUND Right 1970's   "leg" (04/08/2013)   LIVER BIOPSY  ~ 2011   POLYPECTOMY     PROCTOSCOPY N/A 09/08/2016   Procedure: RIGID PROCTOSCOPY;  Surgeon: Michael Boston, MD;  Location: [facility];  Service: General;  Laterality: N/A;   TONSILLECTOMY     Allergies  Allergen Reactions   Penicillins Anaphylaxis and Other (See Comments)    Has patient had a PCN reaction causing immediate rash, facial/tongue/throat swelling, SOB or lightheadedness with hypotension: Yes Has patient had a PCN reaction causing severe rash involving mucus membranes or skin necrosis: No Has patient had a PCN reaction that required hospitalization No Has patient had a PCN reaction occurring within the last 10 years: No If all of the above answers are "NO", then may proceed with Cephalosporin use.   Shellfish Allergy Anaphylaxis   Prior to Admission medications   Medication Sig Start Date End Date Taking? Authorizing Provider  albuterol (VENTOLIN HFA) 108 (90 Base) MCG/ACT inhaler Inhale 2 puffs into the lungs every 6 (six) hours as needed for wheezing or shortness of breath. 05/06/21  Yes Wendie Agreste, MD  atorvastatin (LIPITOR) 10 MG tablet TAKE 1 TABLET EVERY DAY 07/26/21  Yes Wendie Agreste, MD  finasteride (PROSCAR) 5 MG tablet Take 1 tablet (5 mg total) by mouth daily. 01/13/21  Yes McKenzie, Candee Furbish, MD  fluticasone (FLOVENT HFA) 110 MCG/ACT inhaler Inhale 1-2 puffs into the lungs in the morning and at bedtime. 05/06/21  Yes Wendie Agreste, MD  losartan-hydrochlorothiazide (HYZAAR) 50-12.5 MG tablet Take 1 tablet by mouth daily. 05/06/21  Yes Wendie Agreste, MD  sildenafil (REVATIO) 20 MG tablet Take 1 tablet (20 mg total) by mouth as needed. 01/13/21  Yes McKenzie, Candee Furbish, MD  tamsulosin (FLOMAX) 0.4 MG CAPS capsule Take 2 capsules (0.8 mg total) by mouth at bedtime. 01/13/21  Yes McKenzie, Candee Furbish, MD  tiotropium  (SPIRIVA HANDIHALER) 18 MCG inhalation capsule Place 1 capsule (18 mcg total) into inhaler and inhale daily. 04/07/21  Yes Wendie Agreste, MD   Social History   Socioeconomic History   Marital status: Married    Spouse name: Not on file   Number of children: 2   Years of education: Not on file   Highest education level: Not on file  Occupational History   Occupation: truck driver    Employer: [employer]    Comment: retired  Tobacco Use   Smoking status: Former    Packs/day: 0.25    Years: 45.00    Pack years: 11.25    Types: Cigarettes    Quit date: 03/05/2021    Years since quitting: 0.5   Smokeless  tobacco: Never  Vaping Use   Vaping Use: Never used  Substance and Sexual Activity   Alcohol use: Yes    Alcohol/week: 12.0 standard drinks    Types: 12 Cans of beer per week    Comment: 12 pack a week.   Drug use: No   Sexual activity: Yes  Other Topics Concern   Not on file  Social History Narrative   Pt lives in 1 story home with his wife   Has 2 adult children   Highest level of education: GED & Trade school   Retired Media planner.    Social Determinants of Health   Financial Resource Strain: Low Risk    Difficulty of Paying Living Expenses: Not hard at all  Food Insecurity: No Food Insecurity   Worried About Charity fundraiser in the Last Year: Never true   [place] in the Last Year: Never true  Transportation Needs: No Transportation Needs   Lack of Transportation (Medical): No   Lack of Transportation (Non-Medical): No  Physical Activity: Inactive   Days of Exercise per Week: 0 days   Minutes of Exercise per Session: 0 min  Stress: No Stress Concern Present   Feeling of Stress : Not at all  Social Connections: Moderately Integrated   Frequency of Communication with Friends and Family: Twice a week   Frequency of Social Gatherings with Friends and Family: Twice a week   Attends Religious Services: 1 to 4 times per year   Active  Member of [employer] or Organizations: No   Attends Music therapist: Never   Marital Status: Married  Human resources officer Violence: Not At Risk   Fear of Current or Ex-Partner: No   Emotionally Abused: No   Physically Abused: No   Sexually Abused: No    Review of Systems  Constitutional:  Negative for fatigue and unexpected weight change.  Eyes:  Negative for visual disturbance.  Respiratory:  Positive for cough and wheezing. Negative for chest tightness and shortness of breath.   Cardiovascular:  Negative for chest pain, palpitations and leg swelling.  Gastrointestinal:  Negative for abdominal pain and blood in stool.  Neurological:  Negative for dizziness, light-headedness and headaches.    Objective:   Vitals:   09/29/21 0854  BP: 126/74  Pulse: (!) 56  Resp: 16  Temp: 98.2 F (36.8 C)  TempSrc: Temporal  SpO2: 94%  Weight: 189 lb 12.8 oz (86.1 kg)  Height: '5\' 9"'  (1.753 m)     Physical Exam Vitals reviewed.  Constitutional:      Appearance: He is well-developed.  HENT:     Head: Normocephalic and atraumatic.  Neck:     Vascular: No carotid bruit or JVD.  Cardiovascular:     Rate and Rhythm: Normal rate and regular rhythm.     Heart sounds: Normal heart sounds. No murmur heard. Pulmonary:     Effort: Pulmonary effort is normal. No respiratory distress.     Breath sounds: No stridor. Wheezing present. No rales.     Comments: end expiratory wheeze diffusely.  Speaking in full sentences.  No respiratory distress. Used albuterol inhaler with symptomatic improvement in office  Musculoskeletal:     Right lower leg: No edema.     Left lower leg: No edema.  Skin:    General: Skin is warm and dry.  Neurological:     Mental Status: He is alert and oriented to person, place, and time.  Psychiatric:  Mood and Affect: Mood normal.       Assessment & Plan:  Phillip Krol. is a 81 y.o. male . Type 2 diabetes mellitus with hyperglycemia, without  long-term current use of insulin (HCC) - Plan: Hemoglobin A1c, Comprehensive metabolic panel, blood glucose meter kit and supplies  - check labs, home monitoring on prednisone with RTC precautions.  May need to start meds depending on A1c as slight increase last visit.  Essential hypertension - Plan: losartan-hydrochlorothiazide (HYZAAR) 50-12.5 MG tablet, Comprehensive metabolic panel  -Stable, continue same meds.  Check labs.  COPD exacerbation (HCC) - Plan: predniSONE (DELTASONE) 20 MG tablet, azithromycin (ZITHROMAX) 250 MG tablet, benzonatate (TESSALON) 100 MG capsule Wheezing - Plan: benzonatate (TESSALON) 100 MG capsule  -COPD exacerbation, with increased production of mucus we will start azithromycin plus prednisone, potential side effects discussed.  Albuterol inhaler if needed, symptomatic improvement in office with use of home inhaler.  As long as he is improving we will recheck in 2 weeks to decide on changes for COPD treatment.  ER/RTC precautions given  Hyperlipidemia, unspecified hyperlipidemia type - Plan: Lipid panel  -Tolerating current meds, check labs.  Meds ordered this encounter  Medications   losartan-hydrochlorothiazide (HYZAAR) 50-12.5 MG tablet    Sig: Take 1 tablet by mouth daily.    Dispense:  90 tablet    Refill:  1   predniSONE (DELTASONE) 20 MG tablet    Sig: Take 2 tablets (40 mg total) by mouth daily with breakfast.    Dispense:  10 tablet    Refill:  0   azithromycin (ZITHROMAX) 250 MG tablet    Sig: Take 2 tablets on day 1, then 1 tablet daily on days 2 through 5    Dispense:  6 tablet    Refill:  0   blood glucose meter kit and supplies    Sig: Dispense based on patient and insurance preference. Use up to four times daily as directed. (FOR ICD-10 E10.9, E11.9).    Dispense:  1 each    Refill:  0    Order Specific Question:   Number of strips    Answer:   100    Order Specific Question:   Number of lancets    Answer:   100   benzonatate (TESSALON)  100 MG capsule    Sig: Take 1 capsule (100 mg total) by mouth 3 (three) times daily as needed for cough.    Dispense:  20 capsule    Refill:  0   Patient Instructions  Prednisone, zpak for COPD/cough. Be seen if any return of fever or not improving next few days.  Check blood sugar once per day - if over 200 let me know. Prednisone may increase readings temporarily.  Tylenol is safer than advil for sore muscles  - try to take that instead. Tessalon perles if needed for cough, but other new meds should help.   Return to the clinic or go to the nearest emergency room if any of your symptoms worsen or new symptoms occur.  Chronic Obstructive Pulmonary Disease Exacerbation Chronic obstructive pulmonary disease (COPD) is a long-term (chronic) lung problem. In COPD, the flow of air from the lungs is limited. COPD exacerbations are times that breathing gets worse and you need more than your normal treatment. Without treatment, they can be life-threatening. If they happen often, your lungs can become more damaged. What are the causes? Having infections that affect your airways and lungs. Being exposed to: Smoke. Air pollution.  Chemical fumes. Dust. Things that can cause an allergic reaction (allergens). Not taking your usual COPD medicines as told. Having medical problems already, such as heart failure or infections not involving the lungs. In many cases, the cause is not known. What increases the risk? Smoking. Being an older adult. Having frequent prior COPD exacerbations. What are the signs or symptoms? Increased coughing. Increased mucus from your lungs. Increased wheezing. Increased shortness of breath. Fast breathing and finding it hard to breathe. Chest tightness. Less energy than usual. Sleep disruption from symptoms. Confusion. Increased sleepiness. Often, these symptoms happen or get worse even with the use of medicines. How is this treated? Treatment for this condition  depends on how bad it is and the cause of the symptoms. You may need to stay in the hospital for treatment. Treatment may include: Taking medicines. Using oxygen. Being treated with different ways to clear your airway, such as using a mask to deliver oxygen. Follow these instructions at home: Medicines Take over-the-counter and prescription medicines only as told by your doctor. Use all inhaled medicines the correct way. If you were prescribed an antibiotic or steroid medicine, take it as told by your doctor. Do not stop taking it even if you start to feel better. Lifestyle Do not smoke or use any products that contain nicotine or tobacco. If you need help quitting, ask your doctor. Eat healthy foods. Exercise regularly. Get enough sleep. Most adults need 7 or more hours per night. Avoid tobacco smoke and other things that can bother your lungs. Several times a day, wash your hands with soap and water for at least 20 seconds. If you cannot use soap and water, use hand sanitizer. This may help keep you from getting an infection. During flu season, avoid areas that are crowded with people. General instructions Drink enough fluid to keep your pee (urine) pale yellow. Do not do this if your doctor has told you not to. Use a cool mist machine (vaporizer). If you use oxygen or a machine that turns medicine into a mist (nebulizer), continue to use it as told. Keep all follow-up visits. How is this prevented? Keep up with shots (vaccinations) as told by your doctor. Be sure to get a yearly flu (influenza) shot. If you smoke, quit smoking. Smoking makes the problem worse. Follow all instructions for rehabilitation. These are steps you can take to make your body work better. Work with your doctor to develop and follow an action plan. This tells you what steps to take when you experience certain symptoms. Contact a doctor if: Your COPD symptoms get worse than normal. Get help right away if: You  are short of breath and it gets worse, even when you are resting. You have trouble talking. You have chest pain. You cough up blood. You have a fever. You keep vomiting. You feel weak or you pass out (faint). You feel confused. You are not able to sleep because of your symptoms. You have trouble doing daily activities. These symptoms may be an emergency. Get help right away. Call your local emergency services (911 in the [place] Do not wait to see if the symptoms will go away. Do not drive yourself to the hospital. Summary COPD exacerbations are times that breathing gets worse and you need more treatment than normal. COPD exacerbations can be very serious and may cause your lungs to become more damaged. Do not smoke. If you need help quitting, ask your doctor. Stay up to date on your shots. Get  a flu shot every year. This information is not intended to replace advice given to you by your health care provider. Make sure you discuss any questions you have with your health care provider. Document Revised: 06/24/2020 Document Reviewed: 06/09/2020 Elsevier Patient Education  2022 [place],   [name], MD [place], [facility] 09/29/21 9:51 AM

## 2021-09-30 LAB — LIPID PANEL
Cholesterol: 129 mg/dL (ref 0–200)
HDL: 37.1 mg/dL — ABNORMAL LOW (ref >39.00)
LDL Cholesterol: 64 mg/dL (ref 0–99)
NonHDL: 91.8
Total CHOL/HDL Ratio: 3
Triglycerides: 140 mg/dL (ref 0.0–149.0)
VLDL: 28 mg/dL (ref 0.0–40.0)

## 2021-09-30 LAB — COMPREHENSIVE METABOLIC PANEL
ALT: 13 U/L (ref 0–53)
AST: 18 U/L (ref 0–37)
Albumin: 4.3 g/dL (ref 3.5–5.2)
Alkaline Phosphatase: 49 U/L (ref 39–117)
BUN: 14 mg/dL (ref 6–23)
CO2: 29 mEq/L (ref 19–32)
Calcium: 9.5 mg/dL (ref 8.4–10.5)
Chloride: 97 mEq/L (ref 96–112)
Creatinine, Ser: 1.13 mg/dL (ref 0.40–1.50)
GFR: 62.19 mL/min (ref >60.00)
Glucose, Bld: 153 mg/dL — ABNORMAL HIGH (ref 70–99)
Potassium: 3.5 mEq/L (ref 3.5–5.1)
Sodium: 135 mEq/L (ref 135–145)
Total Bilirubin: 0.6 mg/dL (ref 0.2–1.2)
Total Protein: 8.9 g/dL — ABNORMAL HIGH (ref 6.0–8.3)

## 2021-09-30 NOTE — Telephone Encounter (Signed)
Attempted call to patient, stated could not complete call at this time.   Will attempt another call tomorrow

## 2021-09-30 NOTE — Telephone Encounter (Signed)
Can discuss further at his follow-up visit March 1 unless there is a plan to have that procedure done sooner.  Let me know

## 2021-09-30 NOTE — Telephone Encounter (Signed)
Pt had appointment 09/29/21 and form was faxed in for clearance today

## 2021-10-01 NOTE — Telephone Encounter (Signed)
Attempted another call today again without any luck in reaching the patient

## 2021-10-01 NOTE — Telephone Encounter (Signed)
Called patient at alternative number and he answered. Notes he is unaware of scheduled date for this procedure to happen we will wait until he is seen 10/13/21 to proceed.   Pt will call back if he needs this done sooner

## 2021-10-13 ENCOUNTER — Ambulatory Visit (INDEPENDENT_AMBULATORY_CARE_PROVIDER_SITE_OTHER): Payer: Medicare (Managed Care) | Admitting: Family Medicine

## 2021-10-13 VITALS — BP 138/74 | HR 54 | Temp 97.9°F | Resp 17 | Ht 69.0 in | Wt 191.0 lb

## 2021-10-13 DIAGNOSIS — E1165 Type 2 diabetes mellitus with hyperglycemia: Secondary | ICD-10-CM | POA: Diagnosis not present

## 2021-10-13 DIAGNOSIS — R059 Cough, unspecified: Secondary | ICD-10-CM

## 2021-10-13 DIAGNOSIS — R779 Abnormality of plasma protein, unspecified: Secondary | ICD-10-CM | POA: Diagnosis not present

## 2021-10-13 DIAGNOSIS — H269 Unspecified cataract: Secondary | ICD-10-CM | POA: Diagnosis not present

## 2021-10-13 DIAGNOSIS — J441 Chronic obstructive pulmonary disease with (acute) exacerbation: Secondary | ICD-10-CM

## 2021-10-13 DIAGNOSIS — J449 Chronic obstructive pulmonary disease, unspecified: Secondary | ICD-10-CM

## 2021-10-13 MED ORDER — METFORMIN HCL 500 MG PO TABS: 500.0000 mg | ORAL_TABLET | Freq: Two times a day (BID) | ORAL | 1 refills | Status: DC

## 2021-10-13 NOTE — Progress Notes (Signed)
Subjective:  Patient ID: Phillip Barrette., male    DOB: [birth year]  Age: 81 y.o. MRN: [id number]  CC:  Chief Complaint  Patient presents with   Cough    Pt reports tessalon worked well, notes his cough has resolved. Reports some wheezing at night time but the flovent seems to help that some    Diabetes    Pt has questions about BG readings and what to be looking for notes 170-180s average    HPI Phillip Maynard. presents for   Cough, COPD Follow-up from February 15.  Treated with azithromycin, Tessalon, prednisone for COPD flare.  Usual maintenance medication of Flovent, Spiriva.  Albuterol if needed.  Improved since last visit.  Still some wheezing at night.  Question of coverage for Spiriva with insurance  moving forward, but initially was filled. Using flovent 2 puffs BID.  Albuterol not needed past few days.  Still taking Spiriva - due for renewal.   Diabetes: Currently on diet control.  A1c has increased some over the past year.  Has received prednisone for treatment of cough as above.  Home readings 170-180s. Microalbumin: Elevated at 5.2 on 04/07/2021.  Total protein was slightly elevated at 8.9 on his February 15 labs.  Total protein was normal at 7.6 on 04/07/2021. Ophthalmology exam - prior pt of Dr. Einar Gip. No recent diabetic screen. Has cataract OU - left worse one.  On statin, ARB.  Lab Results  Component Value Date   HGBA1C 7.8 (H) 09/29/2021   HGBA1C 7.5 (H) 04/07/2021   HGBA1C 7.0 (H) 10/07/2020   Lab Results  Component Value Date   MICROALBUR 5.2 (H) 04/07/2021   LDLCALC 64 09/29/2021   CREATININE 1.13 09/29/2021   Clearance for dental procedure: Planned removal of 11 teeth, with alveoloplasty,preparing for a plate.  Planned general anesthesia per medical clearance request by Dr. Hoyt Koch, planned 1 hour procedure Not sure of when he will have procedure as may be cost issue. Planned sedation, but other details unknown.   History Patient Active  Problem List   Diagnosis Date Noted   COPD (chronic obstructive pulmonary disease) ([place]) 10/13/2021   Benign localized prostatic hyperplasia with lower urinary tract symptoms (LUTS) 01/13/2021   Acute cystitis with hematuria 01/13/2021   Nocturia 01/13/2021   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/13/2021   Skin lesion 10/12/2018   Colovesical fistula s/p robotic colectomy/repair 09/08/2016 07/12/2016   Hypertension 02/28/2013   Type 2 diabetes mellitus ([place]) 02/28/2013   Chronic hepatitis C ([place]) 02/14/2011   TOBACCO ABUSE 12/10/2007   MERALGIA PARESTHETICA 12/10/2007   Past Medical History:  Diagnosis Date   Asthma    "when I was a boy" (04/08/2013)   Bronchitis    Colon polyps    Colovesical fistula    Diverticulosis    GERD (gastroesophageal reflux disease)    Hepatitis C    treated with injections   High cholesterol    Hypertension    Type II diabetes mellitus (HCC)    hx of . No longer on medicine   Past Surgical History:  Procedure Laterality Date   COLONOSCOPY  last 09/06/2016   CYSTOSCOPY N/A 09/08/2016   Procedure: FIREFLY INJECTIONS;  Surgeon: Cleon Gustin, MD;  Location: [facility];  Service: Urology;  Laterality: N/A;   INCISION AND DRAINAGE OF WOUND Right 1970's   "leg" (04/08/2013)   LIVER BIOPSY  ~ 2011   POLYPECTOMY     PROCTOSCOPY N/A 09/08/2016   Procedure: RIGID  PROCTOSCOPY;  Surgeon: Michael Boston, MD;  Location: [facility];  Service: General;  Laterality: N/A;   TONSILLECTOMY     Allergies  Allergen Reactions   Penicillins Anaphylaxis and Other (See Comments)    Has patient had a PCN reaction causing immediate rash, facial/tongue/throat swelling, SOB or lightheadedness with hypotension: Yes Has patient had a PCN reaction causing severe rash involving mucus membranes or skin necrosis: No Has patient had a PCN reaction that required hospitalization No Has patient had a PCN reaction occurring within the last 10 years: No If all of the above  answers are "NO", then may proceed with Cephalosporin use.   Shellfish Allergy Anaphylaxis   Prior to Admission medications   Medication Sig Start Date End Date Taking? Authorizing Provider  albuterol (VENTOLIN HFA) 108 (90 Base) MCG/ACT inhaler Inhale 2 puffs into the lungs every 6 (six) hours as needed for wheezing or shortness of breath. 05/06/21  Yes Wendie Agreste, MD  atorvastatin (LIPITOR) 10 MG tablet TAKE 1 TABLET EVERY DAY 07/26/21  Yes Wendie Agreste, MD  blood glucose meter kit and supplies Dispense based on patient and insurance preference. Use up to four times daily as directed. (FOR ICD-10 E10.9, E11.9). 09/29/21  Yes Wendie Agreste, MD  finasteride (PROSCAR) 5 MG tablet Take 1 tablet (5 mg total) by mouth daily. 01/13/21  Yes McKenzie, Candee Furbish, MD  fluticasone (FLOVENT HFA) 110 MCG/ACT inhaler Inhale 1-2 puffs into the lungs in the morning and at bedtime. 05/06/21  Yes Wendie Agreste, MD  losartan-hydrochlorothiazide (HYZAAR) 50-12.5 MG tablet Take 1 tablet by mouth daily. 09/29/21  Yes Wendie Agreste, MD  sildenafil (REVATIO) 20 MG tablet Take 1 tablet (20 mg total) by mouth as needed. 01/13/21  Yes McKenzie, Candee Furbish, MD  tamsulosin (FLOMAX) 0.4 MG CAPS capsule Take 2 capsules (0.8 mg total) by mouth at bedtime. 01/13/21  Yes McKenzie, Candee Furbish, MD  tiotropium (SPIRIVA HANDIHALER) 18 MCG inhalation capsule Place 1 capsule (18 mcg total) into inhaler and inhale daily. 04/07/21  Yes Wendie Agreste, MD   Social History   Socioeconomic History   Marital status: Married    Spouse name: Not on file   Number of children: 2   Years of education: Not on file   Highest education level: Not on file  Occupational History   Occupation: truck driver    Employer: [employer]    Comment: retired  Tobacco Use   Smoking status: Former    Packs/day: 0.25    Years: 45.00    Pack years: 11.25    Types: Cigarettes    Quit date: 03/05/2021    Years since quitting: 0.6    Smokeless tobacco: Never  Vaping Use   Vaping Use: Never used  Substance and Sexual Activity   Alcohol use: Yes    Alcohol/week: 12.0 standard drinks    Types: 12 Cans of beer per week    Comment: 12 pack a week.   Drug use: No   Sexual activity: Yes  Other Topics Concern   Not on file  Social History Narrative   Pt lives in 1 story home with his wife   Has 2 adult children   Highest level of education: GED & Trade school   Retired Media planner.    Social Determinants of Health   Financial Resource Strain: Low Risk    Difficulty of Paying Living Expenses: Not hard at all  Food Insecurity: No Food Insecurity  Worried About Charity fundraiser in the Last Year: Never true   [place] in the Last Year: Never true  Transportation Needs: No Transportation Needs   Lack of Transportation (Medical): No   Lack of Transportation (Non-Medical): No  Physical Activity: Inactive   Days of Exercise per Week: 0 days   Minutes of Exercise per Session: 0 min  Stress: No Stress Concern Present   Feeling of Stress : Not at all  Social Connections: Moderately Integrated   Frequency of Communication with Friends and Family: Twice a week   Frequency of Social Gatherings with Friends and Family: Twice a week   Attends Religious Services: 1 to 4 times per year   Active Member of [employer] or Organizations: No   Attends Archivist Meetings: Never   Marital Status: Married  Human resources officer Violence: Not At Risk   Fear of Current or Ex-Partner: No   Emotionally Abused: No   Physically Abused: No   Sexually Abused: No    Review of Systems  Per HPI.  Objective:   Vitals:   10/13/21 0952  BP: 138/74  Pulse: (!) 54  Resp: 17  Temp: 97.9 F (36.6 C)  TempSrc: Temporal  SpO2: 98%  Weight: 191 lb (86.6 kg)  Height: '5\' 9"'  (1.753 m)     Physical Exam Vitals reviewed.  Constitutional:      Appearance: He is well-developed.  HENT:     Head: Normocephalic and  atraumatic.  Neck:     Vascular: No carotid bruit or JVD.  Cardiovascular:     Rate and Rhythm: Normal rate and regular rhythm.     Heart sounds: Normal heart sounds. No murmur heard. Pulmonary:     Effort: Pulmonary effort is normal. No respiratory distress.     Breath sounds: Wheezing (faint end expiratory wheeze once. overall clear.) present. No rales.  Musculoskeletal:     Right lower leg: No edema.     Left lower leg: No edema.  Skin:    General: Skin is warm and dry.  Neurological:     Mental Status: He is alert and oriented to person, place, and time.  Psychiatric:        Mood and Affect: Mood normal.    39 minutes spent during visit, including chart review, counseling and assimilation of information, exam, discussion of plan, and chart completion.     Assessment & Plan:  Phillip Maynard. is a 81 y.o. male . COPD exacerbation ([place]) - Plan: Ambulatory referral to Pulmonology Cough, unspecified type - Plan: Ambulatory referral to Pulmonology  -Improved, status post exacerbation.  Continue Flovent, has Spiriva for now but looks like that may be changing due to coverage issues.  Has albuterol if needed for breakthrough wheezing, not needed recently.  Refer to pulmonary to evaluate treatment options, decide on further testing.  We will also refer to pulmonary as planned dental procedure involving general anesthesia for 1 hour and would like recommendations given his COPD.   Type 2 diabetes mellitus with hyperglycemia, without long-term current use of insulin ([place]) - Plan: metFORMIN (GLUCOPHAGE) 500 MG tablet, Ambulatory referral to Ophthalmology  -Start metformin given increasing readings, potential side effects discussed.  Repeat A1c in 3 months.  Office visit planned in 1 month.  Chronic obstructive pulmonary disease, unspecified COPD type ([place])  -As above, pulmonary to eval.  Elevated serum protein level  -Elevated last visit, previously normal.  Plan we will recheck  with labs at  next visit in 1 month.  Cataract of both eyes, unspecified cataract type - Plan: Ambulatory referral to Ophthalmology  -Refer to Optho for cataracts and diabetic retinopathy screening.  Planned follow-up for medical clearance/preoperative evaluation for dental procedure requiring general anesthesia.  We will also have pulmonary evaluate from their standpoint and pulmonary recommendations for planned procedure.  Unknown timing of that procedure at this point due to potential cost.  He will let me know and we can schedule preoperative clearance at that time.   Meds ordered this encounter  Medications   metFORMIN (GLUCOPHAGE) 500 MG tablet    Sig: Take 1 tablet (500 mg total) by mouth 2 (two) times daily with a meal.    Dispense:  90 tablet    Refill:  1   Patient Instructions  Start metformin once per day for diabetes. I will refer you to eye specialist.  No med changes for now, but I will refer you to lung specialist.  Let me know about planned dental procedure and timing, then we can schedule a medical clearance visit to discuss recommendations. Some recommendations may be needed from lung specialist as well as they are planning general anesthesia. Bring copy of form with you to lung doctor visit as well.   Return to the clinic or go to the nearest emergency room if any of your symptoms worsen or new symptoms occur.       Signed,   [name], MD [place], [facility] 10/13/21 10:37 AM

## 2021-10-13 NOTE — Patient Instructions (Addendum)
Start metformin once per day for diabetes. I will refer you to eye specialist.  ?No med changes for now, but I will refer you to lung specialist.  ?Let me know about planned dental procedure and timing, then we can schedule a medical clearance visit to discuss recommendations. Some recommendations may be needed from lung specialist as well as they are planning general anesthesia. Bring copy of form with you to lung doctor visit as well.  ? ?Return to the clinic or go to the nearest emergency room if any of your symptoms worsen or new symptoms occur. ? ? ? ?

## 2021-10-14 ENCOUNTER — Other Ambulatory Visit: Payer: Self-pay | Admitting: Family Medicine

## 2021-10-14 DIAGNOSIS — R062 Wheezing: Secondary | ICD-10-CM

## 2021-10-14 DIAGNOSIS — R0609 Other forms of dyspnea: Secondary | ICD-10-CM

## 2021-10-19 ENCOUNTER — Other Ambulatory Visit: Payer: Self-pay | Admitting: Family Medicine

## 2021-10-19 DIAGNOSIS — R0609 Other forms of dyspnea: Secondary | ICD-10-CM

## 2021-10-19 DIAGNOSIS — R062 Wheezing: Secondary | ICD-10-CM

## 2021-10-26 ENCOUNTER — Other Ambulatory Visit: Payer: Self-pay

## 2021-10-26 ENCOUNTER — Ambulatory Visit (INDEPENDENT_AMBULATORY_CARE_PROVIDER_SITE_OTHER): Payer: Medicare (Managed Care) | Admitting: Pulmonary Disease

## 2021-10-26 ENCOUNTER — Encounter: Payer: Self-pay | Admitting: Pulmonary Disease

## 2021-10-26 VITALS — BP 126/84 | HR 62 | Ht 69.0 in | Wt 192.0 lb

## 2021-10-26 DIAGNOSIS — J449 Chronic obstructive pulmonary disease, unspecified: Secondary | ICD-10-CM

## 2021-10-26 LAB — CBC WITH DIFFERENTIAL/PLATELET
Basophils Absolute: 0.1 10*3/uL (ref 0.0–0.1)
Basophils Relative: 0.8 % (ref 0.0–3.0)
Eosinophils Absolute: 1.8 10*3/uL — ABNORMAL HIGH (ref 0.0–0.7)
Eosinophils Relative: 20.9 % — ABNORMAL HIGH (ref 0.0–5.0)
HCT: 41.5 % (ref 39.0–52.0)
Hemoglobin: 14 g/dL (ref 13.0–17.0)
Lymphocytes Relative: 28.3 % (ref 12.0–46.0)
Lymphs Abs: 2.4 10*3/uL (ref 0.7–4.0)
MCHC: 33.8 g/dL (ref 30.0–36.0)
MCV: 91.1 fl (ref 78.0–100.0)
Monocytes Absolute: 0.7 10*3/uL (ref 0.1–1.0)
Monocytes Relative: 7.8 % (ref 3.0–12.0)
Neutro Abs: 3.5 10*3/uL (ref 1.4–7.7)
Neutrophils Relative %: 42.2 % — ABNORMAL LOW (ref 43.0–77.0)
Platelets: 289 10*3/uL (ref 150.0–400.0)
RBC: 4.55 Mil/uL (ref 4.22–5.81)
RDW: 13.4 % (ref 11.5–15.5)
WBC: 8.4 10*3/uL (ref 4.0–10.5)

## 2021-10-26 MED ORDER — STIOLTO RESPIMAT 2.5-2.5 MCG/ACT IN AERS: 2.0000 | INHALATION_SPRAY | Freq: Every day | RESPIRATORY_TRACT | 0 refills | Status: DC

## 2021-10-26 NOTE — Progress Notes (Signed)
? ?Synopsis: Referred in March 2023 for COPD by [name], MD ? ?Subjective:  ? ?PATIENT ID: Phillip Maynard. GENDER: male DOB: 17-Mar-1941, MRN: [id number] ? ? ?HPI ? ?Chief Complaint  ?Patient presents with  ? Consult  ?  Referred by PCP for possible COPD or emphysema.   ? ?Phillip Maynard is a 81 year old male, former smoker with GERD, hypertension and DMII who is referred to pulmonary clinic for COPD. ? ?Patient is having exertional dyspnea when walking to his mail box that has a slight incline in the driveway. Over recent weeks he is needing to stop to rest due to the shortness of breath. He does experience intermittent wheezing, mainly dry cough but will have some clear mucous production every once in a while. He denies night time awakenings due to shortness of breath, cough or wheezing. He has gained about 12lbs over the last year.  ? ?He has been using flovent 163mg 2 puffs twice daily and spiriva have helped improve his breathing. He reports taking prednisone 3 weeks ago which helped his breathing and cough.  ? ?He quit smoking last year. He was smoking 0.5 to 1 pack per day for 30 years. 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 He lives with his wife. His mother had asthma and his brother and sisters have asthma.  ? ?He is having dental surgery in the near future. No issues with anesthesia in the past.  ? ?Past Medical History:  ?Diagnosis Date  ? Asthma   ? "when I was a boy" (04/08/2013)  ? Bronchitis   ? Colon polyps   ? Colovesical fistula   ? Diverticulosis   ? GERD (gastroesophageal reflux disease)   ? Hepatitis C   ? treated with injections  ? High cholesterol   ? Hypertension   ? [place]   ? hx of . No longer on medicine  ?  ? ?Family History  ?Problem Relation Age of Onset  ? Asthma Mother   ? Heart attack Mother   ? Colon polyps Neg Hx   ? Colon cancer Neg Hx   ? Esophageal  cancer Neg Hx   ? Rectal cancer Neg Hx   ? Stomach cancer Neg Hx   ?  ? ?Social History  ? ?Socioeconomic History  ? Marital status: Married  ?  Spouse name: Not on file  ? Number of children: 2  ? Years of education: Not on file  ? Highest education level: Not on file  ?Occupational History  ? Occupation: truck dGeophysicist/field seismologist ?  Employer: [employer]  ?  Comment: retired  ?Tobacco Use  ? Smoking status: Former  ?  Packs/day: 0.25  ?  Years: 45.00  ?  Pack years: 11.25  ?  Types: Cigarettes  ?  Quit date: 03/05/2021  ?  Years since quitting: 0.6  ? Smokeless tobacco: Never  ?Vaping Use  ? Vaping Use: Never used  ?Substance and Sexual Activity  ? Alcohol use: Yes  ?  Alcohol/week: 12.0 standard drinks  ?  Types: 12 Cans of beer per week  ?  Comment: 12 pack a week.  ? Drug use: No  ? Sexual activity: Yes  ?Other Topics Concern  ? Not on file  ?Social History Narrative  ? Pt lives in 1 story home with his wife  ? Has 2 adult  children  ? Highest level of education: GED & Trade school  ? Retired Media planner.   ? ?Social Determinants of Health  ? ?Financial Resource Strain: Low Risk   ? Difficulty of Paying Living Expenses: Not hard at all  ?Food Insecurity: No Food Insecurity  ? Worried About Charity fundraiser in the Last Year: Never true  ? Ran Out of Food in the Last Year: Never true  ?Transportation Needs: No Transportation Needs  ? Lack of Transportation (Medical): No  ? Lack of Transportation (Non-Medical): No  ?Physical Activity: Inactive  ? Days of Exercise per Week: 0 days  ? Minutes of Exercise per Session: 0 min  ?Stress: No Stress Concern Present  ? Feeling of Stress : Not at all  ?Social Connections: Moderately Integrated  ? Frequency of Communication with Friends and Family: Twice a week  ? Frequency of Social Gatherings with Friends and Family: Twice a week  ? Attends Religious Services: 1 to 4 times per year  ? Active Member of Clubs or Organizations: No  ? Attends Archivist Meetings:  Never  ? Marital Status: Married  ?Intimate Partner Violence: Not At Risk  ? Fear of Current or Ex-Partner: No  ? Emotionally Abused: No  ? Physically Abused: No  ? Sexually Abused: No  ?  ? ?Allergies  ?Allergen Reactions  ? Penicillins Anaphylaxis and Other (See Comments)  ?  Has patient had a PCN reaction causing immediate rash, facial/tongue/throat swelling, SOB or lightheadedness with hypotension: Yes ?Has patient had a PCN reaction causing severe rash involving mucus membranes or skin necrosis: No ?Has patient had a PCN reaction that required hospitalization No ?Has patient had a PCN reaction occurring within the last 10 years: No ?If all of the above answers are "NO", then may proceed with Cephalosporin use.  ? Shellfish Allergy Anaphylaxis  ?  ? ?Outpatient Medications Prior to Visit  ?Medication Sig Dispense Refill  ? albuterol (VENTOLIN HFA) 108 (90 Base) MCG/ACT inhaler TAKE 2 PUFFS BY MOUTH EVERY 6 HOURS AS NEEDED FOR WHEEZE OR SHORTNESS OF BREATH (Patient taking differently: Inhale 2 puffs into the lungs every 6 (six) hours as needed for wheezing or shortness of breath.) 18 each 1  ? atorvastatin (LIPITOR) 10 MG tablet TAKE 1 TABLET EVERY DAY (Patient taking differently: Take 10 mg by mouth daily.) 90 tablet 1  ? blood glucose meter kit and supplies Dispense based on patient and insurance preference. Use up to four times daily as directed. (FOR ICD-10 E10.9, E11.9). 1 each 0  ? finasteride (PROSCAR) 5 MG tablet Take 1 tablet (5 mg total) by mouth daily. 90 tablet 3  ? fluticasone (FLOVENT HFA) 110 MCG/ACT inhaler Inhale 1-2 puffs into the lungs in the morning and at bedtime. 1 each 5  ? losartan-hydrochlorothiazide (HYZAAR) 50-12.5 MG tablet Take 1 tablet by mouth daily. 90 tablet 1  ? metFORMIN (GLUCOPHAGE) 500 MG tablet Take 1 tablet (500 mg total) by mouth 2 (two) times daily with a meal. 90 tablet 1  ? sildenafil (REVATIO) 20 MG tablet Take 1 tablet (20 mg total) by mouth as needed. (Patient taking  differently: Take 20 mg by mouth daily as needed (erectile dysfunction).) 30 tablet 6  ? tamsulosin (FLOMAX) 0.4 MG CAPS capsule Take 2 capsules (0.8 mg total) by mouth at bedtime. 60 capsule 11  ? tiotropium (SPIRIVA HANDIHALER) 18 MCG inhalation capsule Place 1 capsule (18 mcg total) into inhaler and inhale daily. 30 capsule 12  ? ?  No facility-administered medications prior to visit.  ? ?Review of Systems  ?Constitutional:  Negative for chills, fever, malaise/fatigue and weight loss.  ?HENT:  Negative for congestion, sinus pain and sore throat.   ?Eyes: Negative.   ?Respiratory:  Positive for cough, shortness of breath and wheezing. Negative for hemoptysis and sputum production.   ?Cardiovascular:  Negative for chest pain, palpitations, orthopnea, claudication and leg swelling.  ?Gastrointestinal:  Negative for abdominal pain, heartburn, nausea and vomiting.  ?Genitourinary: Negative.   ?Musculoskeletal:  Negative for joint pain and myalgias.  ?Skin:  Negative for rash.  ?Neurological:  Negative for weakness.  ?Endo/Heme/Allergies: Negative.   ?Psychiatric/Behavioral: Negative.    ? ?Objective:  ? ?Vitals:  ? 10/26/21 1041  ?BP: 126/84  ?Pulse: 62  ?SpO2: 98%  ?Weight: 192 lb (87.1 kg)  ?Height: _0  (1.753 m)  ? ? ?Physical Exam ?Constitutional:   ?   General: He is not in acute distress. ?HENT:  ?   Head: Normocephalic and atraumatic.  ?Eyes:  ?   Extraocular Movements: Extraocular movements intact.  ?   Conjunctiva/sclera: Conjunctivae normal.  ?   Pupils: Pupils are equal, round, and reactive to light.  ?Cardiovascular:  ?   Rate and Rhythm: Normal rate and regular rhythm.  ?   Pulses: Normal pulses.  ?   Heart sounds: Normal heart sounds. No murmur heard. ?Pulmonary:  ?   Effort: Pulmonary effort is normal.  ?   Breath sounds: Decreased breath sounds present. No wheezing, rhonchi or rales.  ?Abdominal:  ?   General: Bowel sounds are normal.  ?   Palpations: Abdomen is soft.  ?Musculoskeletal:  ?   Right  lower leg: No edema.  ?   Left lower leg: No edema.  ?Lymphadenopathy:  ?   Cervical: No cervical adenopathy.  ?Skin: ?   General: Skin is warm and dry.  ?Neurological:  ?   General: No focal deficit present.

## 2021-10-26 NOTE — Progress Notes (Signed)
Patient seen in the office today and instructed on use of Stiolto.  Patient expressed understanding and demonstrated technique. ? ?Benetta Spar CMA 10/26/2021 ?

## 2021-10-26 NOTE — Patient Instructions (Addendum)
Continue flovent 2 puffs twice daily ?- rinse mouth out after each use ? ?Try stiolto inhaler 2 puffs daily ? ?Use albuterol 1-2 puffs every 4-6 hours as needed ? ?We will check labs today ? ?We will refer you to our lung cancer screening team ? ?Follow up in 4-6 weeks for pulmonary function tests  ? ? ?

## 2021-10-27 LAB — IGE: IgE (Immunoglobulin E), Serum: 266 kU/L — ABNORMAL HIGH (ref <=114)

## 2021-10-28 ENCOUNTER — Telehealth: Payer: Self-pay

## 2021-10-28 ENCOUNTER — Emergency Department (HOSPITAL_COMMUNITY): Payer: Medicare (Managed Care)

## 2021-10-28 ENCOUNTER — Emergency Department (HOSPITAL_COMMUNITY)
Admission: EM | Admit: 2021-10-28 | Discharge: 2021-10-28 | Disposition: A | Discharge status: Home / Self Care | Payer: Medicare (Managed Care) | Attending: Emergency Medicine | Admitting: Emergency Medicine

## 2021-10-28 ENCOUNTER — Encounter (HOSPITAL_COMMUNITY): Payer: Self-pay

## 2021-10-28 ENCOUNTER — Encounter: Payer: Self-pay | Admitting: Pulmonary Disease

## 2021-10-28 DIAGNOSIS — Z79899 Other long term (current) drug therapy: Secondary | ICD-10-CM | POA: Diagnosis not present

## 2021-10-28 DIAGNOSIS — R319 Hematuria, unspecified: Secondary | ICD-10-CM | POA: Diagnosis present

## 2021-10-28 DIAGNOSIS — Z87442 Personal history of urinary calculi: Secondary | ICD-10-CM | POA: Diagnosis not present

## 2021-10-28 DIAGNOSIS — N400 Enlarged prostate without lower urinary tract symptoms: Secondary | ICD-10-CM | POA: Diagnosis not present

## 2021-10-28 DIAGNOSIS — Z7984 Long term (current) use of oral hypoglycemic drugs: Secondary | ICD-10-CM | POA: Insufficient documentation

## 2021-10-28 DIAGNOSIS — N3289 Other specified disorders of bladder: Secondary | ICD-10-CM | POA: Diagnosis not present

## 2021-10-28 DIAGNOSIS — N3001 Acute cystitis with hematuria: Secondary | ICD-10-CM | POA: Diagnosis not present

## 2021-10-28 DIAGNOSIS — K5730 Diverticulosis of large intestine without perforation or abscess without bleeding: Secondary | ICD-10-CM | POA: Diagnosis not present

## 2021-10-28 DIAGNOSIS — K8020 Calculus of gallbladder without cholecystitis without obstruction: Secondary | ICD-10-CM | POA: Diagnosis not present

## 2021-10-28 DIAGNOSIS — R0602 Shortness of breath: Secondary | ICD-10-CM | POA: Diagnosis not present

## 2021-10-28 LAB — URINALYSIS, ROUTINE W REFLEX MICROSCOPIC: RBC / HPF: >50 RBC/hpf — ABNORMAL HIGH (ref 0–5)

## 2021-10-28 MED ORDER — NITROFURANTOIN MONOHYD MACRO 100 MG PO CAPS
100.0000 mg | ORAL_CAPSULE | Freq: Once | ORAL | Status: AC
Start: 2021-10-28 — End: 2021-10-28
  Administered 2021-10-28: 100 mg via ORAL
  Filled 2021-10-28: qty 1

## 2021-10-28 MED ORDER — NITROFURANTOIN MONOHYD MACRO 100 MG PO CAPS
100.0000 mg | ORAL_CAPSULE | Freq: Two times a day (BID) | ORAL | 0 refills | Status: AC
Start: 2021-10-28 — End: 2021-11-02

## 2021-10-28 NOTE — Telephone Encounter (Signed)
Called LM to return call

## 2021-10-28 NOTE — Telephone Encounter (Signed)
Surgical clearance delivered to office via fax on 09/29/2021 for the removal of 11 teeth, we had initially been informed pt was postponing this surgery. Have since received another clearance request and are now looking to confirm if pt wants to have this procedure done. IF pt is having procedure we will require sign off from pulmonary before he will be considered optimized for surgery  ?

## 2021-10-28 NOTE — ED Triage Notes (Signed)
Pt presents with c/o hematuria that started today. Pt denies any hx of same, denies any pain. ?

## 2021-10-28 NOTE — ED Provider Notes (Signed)
[facility]-EMERGENCY DEPT Provider Note   CSN: [id number] Arrival date & time: 10/28/21  1113     History  Chief Complaint  Patient presents with   Hematuria    Phillip Koda. is a 81 y.o. male present emerged department painless hematuria.  Patient ports onset this morning.  He is not on blood thinners.  He does report a history of an enlarged prostate and a bladder repair in the past, but denies any known history of malignancy.  Denies hx of kidney stone  HPI     Home Medications Prior to Admission medications   Medication Sig Start Date End Date Taking? Authorizing Provider  nitrofurantoin, macrocrystal-monohydrate, (MACROBID) 100 MG capsule Take 1 capsule (100 mg total) by mouth 2 (two) times daily for 5 days. 10/28/21 11/02/21 Yes Rubel Heckard, Kermit Balo, MD  albuterol (VENTOLIN HFA) 108 (90 Base) MCG/ACT inhaler TAKE 2 PUFFS BY MOUTH EVERY 6 HOURS AS NEEDED FOR WHEEZE OR SHORTNESS OF BREATH Patient taking differently: Inhale 2 puffs into the lungs every 6 (six) hours as needed for wheezing or shortness of breath. 10/14/21   Shade Flood, MD  atorvastatin (LIPITOR) 10 MG tablet TAKE 1 TABLET EVERY DAY Patient taking differently: Take 10 mg by mouth daily. 07/26/21   Shade Flood, MD  blood glucose meter kit and supplies Dispense based on patient and insurance preference. Use up to four times daily as directed. (FOR ICD-10 E10.9, E11.9). 09/29/21   Shade Flood, MD  finasteride (PROSCAR) 5 MG tablet Take 1 tablet (5 mg total) by mouth daily. 01/13/21   McKenzie, Mardene Celeste, MD  fluticasone (FLOVENT HFA) 110 MCG/ACT inhaler Inhale 1-2 puffs into the lungs in the morning and at bedtime. 05/06/21   Shade Flood, MD  losartan-hydrochlorothiazide (HYZAAR) 50-12.5 MG tablet Take 1 tablet by mouth daily. 09/29/21   Shade Flood, MD  metFORMIN (GLUCOPHAGE) 500 MG tablet Take 1 tablet (500 mg total) by mouth 2 (two) times daily with a meal. 10/13/21    Shade Flood, MD  sildenafil (REVATIO) 20 MG tablet Take 1 tablet (20 mg total) by mouth as needed. Patient taking differently: Take 20 mg by mouth daily as needed (erectile dysfunction). 01/13/21   McKenzie, Mardene Celeste, MD  tamsulosin (FLOMAX) 0.4 MG CAPS capsule Take 2 capsules (0.8 mg total) by mouth at bedtime. 01/13/21   McKenzie, Mardene Celeste, MD  Tiotropium Bromide-Olodaterol (STIOLTO RESPIMAT) 2.5-2.5 MCG/ACT AERS Inhale 2 puffs into the lungs daily. 10/26/21   Martina Sinner, MD      Allergies    Penicillins and Shellfish allergy    Review of Systems   Review of Systems  Physical Exam Updated Vital Signs BP (!) 168/68   Pulse 66   Temp 97.8 F (36.6 C)   Resp 16   SpO2 98%  Physical Exam Constitutional:      General: He is not in acute distress. HENT:     Head: Normocephalic and atraumatic.  Eyes:     Conjunctiva/sclera: Conjunctivae normal.     Pupils: Pupils are equal, round, and reactive to light.  Cardiovascular:     Rate and Rhythm: Normal rate and regular rhythm.  Pulmonary:     Effort: Pulmonary effort is normal. No respiratory distress.  Skin:    General: Skin is warm and dry.  Neurological:     Mental Status: He is alert.  Psychiatric:        Mood and Affect: Mood normal.  Behavior: Behavior normal.    ED Results / Procedures / Treatments   Labs (all labs ordered are listed, but only abnormal results are displayed) Labs Reviewed  URINALYSIS, ROUTINE W REFLEX MICROSCOPIC - Abnormal; Notable for the following components:      Result Value   Color, Urine RED (*)    APPearance CLOUDY (*)    Glucose, UA   (*)    Value: TEST NOT REPORTED DUE TO COLOR INTERFERENCE OF URINE PIGMENT   Hgb urine dipstick   (*)    Value: TEST NOT REPORTED DUE TO COLOR INTERFERENCE OF URINE PIGMENT   Bilirubin Urine   (*)    Value: TEST NOT REPORTED DUE TO COLOR INTERFERENCE OF URINE PIGMENT   Ketones, ur   (*)    Value: TEST NOT REPORTED DUE TO COLOR INTERFERENCE  OF URINE PIGMENT   Protein, ur   (*)    Value: TEST NOT REPORTED DUE TO COLOR INTERFERENCE OF URINE PIGMENT   Nitrite   (*)    Value: TEST NOT REPORTED DUE TO COLOR INTERFERENCE OF URINE PIGMENT   Leukocytes,Ua   (*)    Value: TEST NOT REPORTED DUE TO COLOR INTERFERENCE OF URINE PIGMENT   RBC / HPF >50 (*)    Bacteria, UA MANY (*)    All other components within normal limits  URINE CULTURE    EKG None  Radiology CT Renal Stone Study  Result Date: 10/28/2021 CLINICAL DATA:  Nephrolithiasis, hematuria. EXAM: CT ABDOMEN AND PELVIS WITHOUT CONTRAST TECHNIQUE: Multidetector CT imaging of the abdomen and pelvis was performed following the standard protocol without IV contrast. RADIATION DOSE REDUCTION: This exam was performed according to the departmental dose-optimization program which includes automated exposure control, adjustment of the mA and/or kV according to patient size and/or use of iterative reconstruction technique. COMPARISON:  CT Dec 17, 2013 and January 14, 2016 FINDINGS: Lower chest: Bibasilar atelectasis versus scarring. Hepatobiliary: No suspicious hepatic lesion on this noncontrast examination. Cholelithiasis without findings of acute cholecystitis. No biliary ductal dilation. Pancreas: No pancreatic ductal dilation or evidence of acute inflammation. Spleen: No splenomegaly or focal splenic lesion. Adrenals/Urinary Tract: Bilateral adrenal glands appear normal. No hydronephrosis. No renal, ureteral or bladder calculi. Exophytic 1.2 cm left renal cyst. Endophytic 2.3 cm soft tissue mass extending from the anterior urinary bladder wall on image 69/2. Stomach/Bowel: No enteric contrast was administered. Stomach is unremarkable for degree of distension. Diverticulum from the third part of the duodenum. No pathologic dilation of small or large bowel. The appendix and terminal ileum appear normal. Colonic diverticulosis without findings of acute diverticulitis. No evidence of acute bowel  inflammation. Vascular/Lymphatic: Aortic atherosclerosis without aneurysmal dilation. No pathologically enlarged abdominal or pelvic lymph nodes. Reproductive: Heterogeneous enlargement of the prostate gland. Calcifications of the vas deferens and seminal vesicles. Other: No significant abdominopelvic free fluid. Musculoskeletal: Advanced multilevel degenerative changes spine. 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 No aggressive lytic or blastic lesion of bone. IMPRESSION: 1. Endophytic 2.3 cm soft tissue mass extending from the anterior urinary bladder wall, suspicious for bladder neoplasm. Suggest urology consult in further evaluation with cystoscopy. 2. Heterogeneous enlargement of the prostate gland. 3. Colonic diverticulosis without findings of acute diverticulitis. 4. Cholelithiasis without findings of acute cholecystitis. 5.  Aortic Atherosclerosis (ICD10-I70.0). Electronically Signed   By: Maudry Mayhew M.D.   On: 10/28/2021 13:35    Procedures Procedures    Medications Ordered in ED Medications  nitrofurantoin (  macrocrystal-monohydrate) (MACROBID) capsule 100 mg (100 mg Oral Given 10/28/21 1457)    ED Course/ Medical Decision Making/ A&P Clinical Course as of 10/28/21 1720  Thu Oct 28, 2021  1348 IMPRESSION: 1. Endophytic 2.3 cm soft tissue mass extending from the anterior urinary bladder wall, suspicious for bladder neoplasm. Suggest urology consult in further evaluation with cystoscopy. 2. Heterogeneous enlargement of the prostate gland. 3. Colonic diverticulosis without findings of acute diverticulitis. 4. Cholelithiasis without findings of acute cholecystitis. 5.  Aortic Atherosclerosis (ICD10-I70.0). [MT]  1348 Patient advised of need for urology follow-up for suspected bladder mass.  He verbalized understanding and agreement the plan.  Otherwise he is stable for discharge at this time [MT]    Clinical Course User  Index [MT] Syreeta Figler, Kermit Balo, MD                           Medical Decision Making Amount and/or Complexity of Data Reviewed Labs: ordered. Radiology: ordered.  Risk Prescription drug management.   Hematuria, painless Ddx includes kidney stone vs UTI vs bladder cancer vs other UA and CT renal study ordered and personally interpreted showing bladder mass (concerning for) UA grossly bloody, but has bacteria and leukocytes - will start on macrobid for possible UTI  Doubt significant blood loss anemia; do not feel we need emergent blood tests        Final Clinical Impression(s) / ED Diagnoses Final diagnoses:  Bladder mass  Hematuria, unspecified type    Rx / DC Orders ED Discharge Orders          Ordered    Ambulatory referral to Urology       Comments: Patient sees Dr Ronne Binning, needs stat office evaluation for new bleeding bladder mass diagnosed on CT in the [facility] ER today.  Please expedite next appointment if possible.   10/28/21 1403    nitrofurantoin, macrocrystal-monohydrate, (MACROBID) 100 MG capsule  2 times daily        10/28/21 1429              Taeler Winning, Kermit Balo, MD 10/28/21 1720

## 2021-10-29 NOTE — Telephone Encounter (Signed)
Called again today no answer no VM available  ?

## 2021-10-30 LAB — URINE CULTURE: Culture: NO GROWTH

## 2021-11-01 NOTE — Telephone Encounter (Signed)
Called 3 times sent letter to discuss  ?

## 2021-11-03 ENCOUNTER — Other Ambulatory Visit: Payer: Self-pay

## 2021-11-03 ENCOUNTER — Ambulatory Visit (INDEPENDENT_AMBULATORY_CARE_PROVIDER_SITE_OTHER): Payer: Medicare (Managed Care) | Admitting: Urology

## 2021-11-03 VITALS — BP 136/69 | HR 81

## 2021-11-03 DIAGNOSIS — N401 Enlarged prostate with lower urinary tract symptoms: Secondary | ICD-10-CM | POA: Diagnosis not present

## 2021-11-03 DIAGNOSIS — R351 Nocturia: Secondary | ICD-10-CM | POA: Diagnosis not present

## 2021-11-03 DIAGNOSIS — R310 Gross hematuria: Secondary | ICD-10-CM | POA: Diagnosis not present

## 2021-11-03 LAB — URINALYSIS, ROUTINE W REFLEX MICROSCOPIC
Bilirubin, UA: NEGATIVE
Glucose, UA: NEGATIVE
Ketones, UA: NEGATIVE
Leukocytes,UA: NEGATIVE
Nitrite, UA: NEGATIVE
Specific Gravity, UA: 1.02 (ref 1.005–1.030)
Urobilinogen, Ur: 0.2 mg/dL (ref 0.2–1.0)
pH, UA: 5.5 (ref 5.0–7.5)

## 2021-11-03 LAB — MICROSCOPIC EXAMINATION
Renal Epithel, UA: NONE SEEN /hpf
WBC, UA: NONE SEEN /hpf (ref 0–5)

## 2021-11-03 MED ORDER — CIPROFLOXACIN HCL 500 MG PO TABS
500.0000 mg | ORAL_TABLET | Freq: Once | ORAL | Status: AC
  Administered 2021-11-03: 500 mg via ORAL

## 2021-11-03 MED ORDER — SILDENAFIL CITRATE 100 MG PO TABS: 100.0000 mg | ORAL_TABLET | Freq: Every day | ORAL | 0 refills | Status: DC | PRN

## 2021-11-03 MED ORDER — SILODOSIN 8 MG PO CAPS: 8.0000 mg | ORAL_CAPSULE | Freq: Every day | ORAL | 11 refills | Status: DC

## 2021-11-03 NOTE — Progress Notes (Signed)
? ?11/03/2021 ?10:32 AM  ? ?Phillip Maynard. ?10-18-1940 ?[id number] ? ?Referring provider: Wendie Agreste, MD ?[address] ?[place],  [place] [address] ? ?Gross hematuria ? ? ?HPI: ?[name] is a 81yo here for evaluation of gross hematuria and a possible bladder mass. He presented to the ER on 3/16 with a 2 day history of gross painless hematuria. He denies any worsening LUTS. He underwent CT which showed a possible 2cm bladder mass.  He has a 45pk year smoking history. He denies dysuria or history of UTI.  ? ? ?PMH: ?Past Medical History:  ?Diagnosis Date  ? Asthma   ? "when I was a boy" (04/08/2013)  ? Bronchitis   ? Colon polyps   ? Colovesical fistula   ? Diverticulosis   ? GERD (gastroesophageal reflux disease)   ? Hepatitis C   ? treated with injections  ? High cholesterol   ? Hypertension   ? Type II diabetes mellitus ([place])   ? hx of . No longer on medicine  ? ? ?Surgical History: ?Past Surgical History:  ?Procedure Laterality Date  ? COLONOSCOPY  last 09/06/2016  ? CYSTOSCOPY N/A 09/08/2016  ? Procedure: FIREFLY INJECTIONS;  Surgeon: Cleon Gustin, MD;  Location: [facility];  Service: Urology;  Laterality: N/A;  ? INCISION AND DRAINAGE OF WOUND Right 1970's  ? "leg" (04/08/2013)  ? LIVER BIOPSY  ~ 2011  ? POLYPECTOMY    ? PROCTOSCOPY N/A 09/08/2016  ? Procedure: RIGID PROCTOSCOPY;  Surgeon: Michael Boston, MD;  Location: [facility];  Service: General;  Laterality: N/A;  ? TONSILLECTOMY    ? ? ?Home Medications:  ?Allergies as of 11/03/2021   ? ?   Reactions  ? Penicillins Anaphylaxis, Other (See Comments)  ? Has patient had a PCN reaction causing immediate rash, facial/tongue/throat swelling, SOB or lightheadedness with hypotension: Yes ?Has patient had a PCN reaction causing severe rash involving mucus membranes or skin necrosis: No ?Has patient had a PCN reaction that required hospitalization No ?Has patient had a PCN reaction occurring within the last 10 years: No ?If all of the above answers are "NO",  then may proceed with Cephalosporin use.  ? Shellfish Allergy Anaphylaxis  ? ?  ? ?  ?Medication List  ?  ? ?  ? Accurate as of November 03, 2021 10:32 AM. If you have any questions, ask your nurse or doctor.  ?  ?  ? ?  ? ?albuterol 108 (90 Base) MCG/ACT inhaler ?Commonly known as: VENTOLIN HFA ?TAKE 2 PUFFS BY MOUTH EVERY 6 HOURS AS NEEDED FOR WHEEZE OR SHORTNESS OF BREATH ?What changed: See the new instructions. ?  ?atorvastatin 10 MG tablet ?Commonly known as: LIPITOR ?TAKE 1 TABLET EVERY DAY ?  ?blood glucose meter kit and supplies ?Dispense based on patient and insurance preference. Use up to four times daily as directed. (FOR ICD-10 E10.9, E11.9). ?  ?finasteride 5 MG tablet ?Commonly known as: PROSCAR ?Take 1 tablet (5 mg total) by mouth daily. ?  ?fluticasone 110 MCG/ACT inhaler ?Commonly known as: Flovent HFA ?Inhale 1-2 puffs into the lungs in the morning and at bedtime. ?  ?losartan-hydrochlorothiazide 50-12.5 MG tablet ?Commonly known as: HYZAAR ?Take 1 tablet by mouth daily. ?  ?metFORMIN 500 MG tablet ?Commonly known as: GLUCOPHAGE ?Take 1 tablet (500 mg total) by mouth 2 (two) times daily with a meal. ?  ?sildenafil 20 MG tablet ?Commonly known as: REVATIO ?Take 1 tablet (20 mg total) by mouth  as needed. ?  ?Stiolto Respimat 2.5-2.5 MCG/ACT Aers ?Generic drug: Tiotropium Bromide-Olodaterol ?Inhale 2 puffs into the lungs daily. ?  ?tamsulosin 0.4 MG Caps capsule ?Commonly known as: FLOMAX ?Take 2 capsules (0.8 mg total) by mouth at bedtime. ?  ? ?  ? ? ?Allergies:  ?Allergies  ?Allergen Reactions  ? Penicillins Anaphylaxis and Other (See Comments)  ?  Has patient had a PCN reaction causing immediate rash, facial/tongue/throat swelling, SOB or lightheadedness with hypotension: Yes ?Has patient had a PCN reaction causing severe rash involving mucus membranes or skin necrosis: No ?Has patient had a PCN reaction that required hospitalization No ?Has patient had a PCN reaction occurring within the last 10  years: No ?If all of the above answers are "NO", then may proceed with Cephalosporin use.  ? Shellfish Allergy Anaphylaxis  ? ? ?Family History: ?Family History  ?Problem Relation Age of Onset  ? Asthma Mother   ? Heart attack Mother   ? Colon polyps Neg Hx   ? Colon cancer Neg Hx   ? Esophageal cancer Neg Hx   ? Rectal cancer Neg Hx   ? Stomach cancer Neg Hx   ? ? ?Social History:  reports that he quit smoking about 7 months ago. His smoking use included cigarettes. He has a 11.25 pack-year smoking history. He has never used smokeless tobacco. He reports current alcohol use of about 12.0 standard drinks per week. He reports that he does not use drugs. ? ?ROS: ?All other review of systems were reviewed and are negative except what is noted above in HPI ? ?Physical Exam: ?BP 136/69   Pulse 81   ?Constitutional:  Alert and oriented, No acute distress. ?HEENT: [place] AT, moist mucus membranes.  Trachea midline, no masses. ?Cardiovascular: No clubbing, cyanosis, or edema. ?Respiratory: Normal respiratory effort, no increased work of breathing. ?GI: Abdomen is soft, nontender, nondistended, no abdominal masses ?GU: No CVA tenderness.  ?Lymph: No cervical or inguinal lymphadenopathy. ?Skin: No rashes, bruises or suspicious lesions. ?Neurologic: Grossly intact, no focal deficits, moving all 4 extremities. ?Psychiatric: Normal mood and affect. ? ?Laboratory Data: ?Lab Results  ?Component Value Date  ? WBC 8.4 10/26/2021  ? HGB 14.0 10/26/2021  ? HCT 41.5 10/26/2021  ? MCV 91.1 10/26/2021  ? PLT 289.0 10/26/2021  ? ? ?Lab Results  ?Component Value Date  ? CREATININE 1.13 09/29/2021  ? ? ?Lab Results  ?Component Value Date  ? PSA 0.9 05/23/2016  ? PSA 3.16 09/23/2015  ? ? ?No results found for: TESTOSTERONE ? ?Lab Results  ?Component Value Date  ? HGBA1C 7.8 (H) 09/29/2021  ? ? ?Urinalysis ?   ?Component Value Date/Time  ? COLORURINE RED (A) 10/28/2021 1253  ? APPEARANCEUR CLOUDY (A) 10/28/2021 1253  ? APPEARANCEUR Clear  01/13/2021 1443  ? LABSPEC  10/28/2021 1253  ?  TEST NOT REPORTED DUE TO COLOR INTERFERENCE OF URINE PIGMENT  ? PHURINE  10/28/2021 1253  ?  TEST NOT REPORTED DUE TO COLOR INTERFERENCE OF URINE PIGMENT  ? GLUCOSEU (A) 10/28/2021 1253  ?  TEST NOT REPORTED DUE TO COLOR INTERFERENCE OF URINE PIGMENT  ? HGBUR (A) 10/28/2021 1253  ?  TEST NOT REPORTED DUE TO COLOR INTERFERENCE OF URINE PIGMENT  ? BILIRUBINUR (A) 10/28/2021 1253  ?  TEST NOT REPORTED DUE TO COLOR INTERFERENCE OF URINE PIGMENT  ? BILIRUBINUR Negative 01/13/2021 1443  ? KETONESUR (A) 10/28/2021 1253  ?  TEST NOT REPORTED DUE TO COLOR INTERFERENCE OF URINE PIGMENT  ?  PROTEINUR (A) 10/28/2021 1253  ?  TEST NOT REPORTED DUE TO COLOR INTERFERENCE OF URINE PIGMENT  ? UROBILINOGEN 0.2 06/23/2016 1441  ? UROBILINOGEN 1.0 12/17/2013 0528  ? NITRITE (A) 10/28/2021 1253  ?  TEST NOT REPORTED DUE TO COLOR INTERFERENCE OF URINE PIGMENT  ? LEUKOCYTESUR (A) 10/28/2021 1253  ?  TEST NOT REPORTED DUE TO COLOR INTERFERENCE OF URINE PIGMENT  ? ? ?Lab Results  ?Component Value Date  ? LABMICR See below: 01/13/2021  ? [place] None seen 01/13/2021  ? LABEPIT 0-10 01/13/2021  ? MUCUS Present 01/13/2021  ? BACTERIA MANY (A) 10/28/2021  ? ? ?Pertinent Imaging: ?CT 10/28/2021: Images reviewed and discussed with the patient  ?No results found for this or any previous visit. ? ?No results found for this or any previous visit. ? ?No results found for this or any previous visit. ? ?No results found for this or any previous visit. ? ?No results found for this or any previous visit. ? ?No results found for this or any previous visit. ? ?No results found for this or any previous visit. ? ?Results for orders placed during the hospital encounter of 10/28/21 ? ?CT Renal Stone Study ? ?Narrative ?CLINICAL DATA:  Nephrolithiasis, hematuria. ? ?EXAM: ?CT ABDOMEN AND PELVIS WITHOUT CONTRAST ? ?TECHNIQUE: ?Multidetector CT imaging of the abdomen and pelvis was performed ?following the standard  protocol without IV contrast. ? ?RADIATION DOSE REDUCTION: This exam was performed according to the ?departmental dose-optimization program which includes automated ?exposure control, adjustment of the mA and/or kV a

## 2021-11-03 NOTE — H&P (View-Only) (Signed)
? ?11/03/2021 ?10:32 AM  ? ?[name] ?10-18-1940 ?[id number] ? ?Referring provider: Wendie Agreste, MD ?[id number] ?[place],  [place] [address] ? ?Gross hematuria ? ? ?HPI: ?[name] is a 81yo here for evaluation of gross hematuria and a possible bladder mass. He presented to the ER on 3/16 with a 2 day history of gross painless hematuria. He denies any worsening LUTS. He underwent CT which showed a possible 2cm bladder mass.  He has a 45pk year smoking history. He denies dysuria or history of UTI.  ? ? ?PMH: ?Past Medical History:  ?Diagnosis Date  ? Asthma   ? "when I was a boy" (04/08/2013)  ? Bronchitis   ? Colon polyps   ? Colovesical fistula   ? Diverticulosis   ? GERD (gastroesophageal reflux disease)   ? Hepatitis C   ? treated with injections  ? High cholesterol   ? Hypertension   ? Type II diabetes mellitus ([place])   ? hx of . No longer on medicine  ? ? ?Surgical History: ?Past Surgical History:  ?Procedure Laterality Date  ? COLONOSCOPY  last 09/06/2016  ? CYSTOSCOPY N/A 09/08/2016  ? Procedure: FIREFLY INJECTIONS;  Surgeon: Cleon Gustin, MD;  Location: [facility];  Service: Urology;  Laterality: N/A;  ? INCISION AND DRAINAGE OF WOUND Right 1970's  ? "leg" (04/08/2013)  ? LIVER BIOPSY  ~ 2011  ? POLYPECTOMY    ? PROCTOSCOPY N/A 09/08/2016  ? Procedure: RIGID PROCTOSCOPY;  Surgeon: Michael Boston, MD;  Location: [facility];  Service: General;  Laterality: N/A;  ? TONSILLECTOMY    ? ? ?Home Medications:  ?Allergies as of 11/03/2021   ? ?   Reactions  ? Penicillins Anaphylaxis, Other (See Comments)  ? Has patient had a PCN reaction causing immediate rash, facial/tongue/throat swelling, SOB or lightheadedness with hypotension: Yes ?Has patient had a PCN reaction causing severe rash involving mucus membranes or skin necrosis: No ?Has patient had a PCN reaction that required hospitalization No ?Has patient had a PCN reaction occurring within the last 10 years: No ?If all of the above answers are "NO",  then may proceed with Cephalosporin use.  ? Shellfish Allergy Anaphylaxis  ? ?  ? ?  ?Medication List  ?  ? ?  ? Accurate as of November 03, 2021 10:32 AM. If you have any questions, ask your nurse or doctor.  ?  ?  ? ?  ? ?albuterol 108 (90 Base) MCG/ACT inhaler ?Commonly known as: VENTOLIN HFA ?TAKE 2 PUFFS BY MOUTH EVERY 6 HOURS AS NEEDED FOR WHEEZE OR SHORTNESS OF BREATH ?What changed: See the new instructions. ?  ?atorvastatin 10 MG tablet ?Commonly known as: LIPITOR ?TAKE 1 TABLET EVERY DAY ?  ?blood glucose meter kit and supplies ?Dispense based on patient and insurance preference. Use up to four times daily as directed. (FOR ICD-10 E10.9, E11.9). ?  ?finasteride 5 MG tablet ?Commonly known as: PROSCAR ?Take 1 tablet (5 mg total) by mouth daily. ?  ?fluticasone 110 MCG/ACT inhaler ?Commonly known as: Flovent HFA ?Inhale 1-2 puffs into the lungs in the morning and at bedtime. ?  ?losartan-hydrochlorothiazide 50-12.5 MG tablet ?Commonly known as: HYZAAR ?Take 1 tablet by mouth daily. ?  ?metFORMIN 500 MG tablet ?Commonly known as: GLUCOPHAGE ?Take 1 tablet (500 mg total) by mouth 2 (two) times daily with a meal. ?  ?sildenafil 20 MG tablet ?Commonly known as: REVATIO ?Take 1 tablet (20 mg total) by mouth  as needed. ?  ?Stiolto Respimat 2.5-2.5 MCG/ACT Aers ?Generic drug: Tiotropium Bromide-Olodaterol ?Inhale 2 puffs into the lungs daily. ?  ?tamsulosin 0.4 MG Caps capsule ?Commonly known as: FLOMAX ?Take 2 capsules (0.8 mg total) by mouth at bedtime. ?  ? ?  ? ? ?Allergies:  ?Allergies  ?Allergen Reactions  ? Penicillins Anaphylaxis and Other (See Comments)  ?  Has patient had a PCN reaction causing immediate rash, facial/tongue/throat swelling, SOB or lightheadedness with hypotension: Yes ?Has patient had a PCN reaction causing severe rash involving mucus membranes or skin necrosis: No ?Has patient had a PCN reaction that required hospitalization No ?Has patient had a PCN reaction occurring within the last 10  years: No ?If all of the above answers are "NO", then may proceed with Cephalosporin use.  ? Shellfish Allergy Anaphylaxis  ? ? ?Family History: ?Family History  ?Problem Relation Age of Onset  ? Asthma Mother   ? Heart attack Mother   ? Colon polyps Neg Hx   ? Colon cancer Neg Hx   ? Esophageal cancer Neg Hx   ? Rectal cancer Neg Hx   ? Stomach cancer Neg Hx   ? ? ?Social History:  reports that he quit smoking about 7 months ago. His smoking use included cigarettes. He has a 11.25 pack-year smoking history. He has never used smokeless tobacco. He reports current alcohol use of about 12.0 standard drinks per week. He reports that he does not use drugs. ? ?ROS: ?All other review of systems were reviewed and are negative except what is noted above in HPI ? ?Physical Exam: ?BP 136/69   Pulse 81   ?Constitutional:  Alert and oriented, No acute distress. ?HEENT: [place] AT, moist mucus membranes.  Trachea midline, no masses. ?Cardiovascular: No clubbing, cyanosis, or edema. ?Respiratory: Normal respiratory effort, no increased work of breathing. ?GI: Abdomen is soft, nontender, nondistended, no abdominal masses ?GU: No CVA tenderness.  ?Lymph: No cervical or inguinal lymphadenopathy. ?Skin: No rashes, bruises or suspicious lesions. ?Neurologic: Grossly intact, no focal deficits, moving all 4 extremities. ?Psychiatric: Normal mood and affect. ? ?Laboratory Data: ?Lab Results  ?Component Value Date  ? WBC 8.4 10/26/2021  ? HGB 14.0 10/26/2021  ? HCT 41.5 10/26/2021  ? MCV 91.1 10/26/2021  ? PLT 289.0 10/26/2021  ? ? ?Lab Results  ?Component Value Date  ? CREATININE 1.13 09/29/2021  ? ? ?Lab Results  ?Component Value Date  ? PSA 0.9 05/23/2016  ? PSA 3.16 09/23/2015  ? ? ?No results found for: TESTOSTERONE ? ?Lab Results  ?Component Value Date  ? HGBA1C 7.8 (H) 09/29/2021  ? ? ?Urinalysis ?   ?Component Value Date/Time  ? COLORURINE RED (A) 10/28/2021 1253  ? APPEARANCEUR CLOUDY (A) 10/28/2021 1253  ? APPEARANCEUR Clear  01/13/2021 1443  ? LABSPEC  10/28/2021 1253  ?  TEST NOT REPORTED DUE TO COLOR INTERFERENCE OF URINE PIGMENT  ? PHURINE  10/28/2021 1253  ?  TEST NOT REPORTED DUE TO COLOR INTERFERENCE OF URINE PIGMENT  ? GLUCOSEU (A) 10/28/2021 1253  ?  TEST NOT REPORTED DUE TO COLOR INTERFERENCE OF URINE PIGMENT  ? HGBUR (A) 10/28/2021 1253  ?  TEST NOT REPORTED DUE TO COLOR INTERFERENCE OF URINE PIGMENT  ? BILIRUBINUR (A) 10/28/2021 1253  ?  TEST NOT REPORTED DUE TO COLOR INTERFERENCE OF URINE PIGMENT  ? BILIRUBINUR Negative 01/13/2021 1443  ? KETONESUR (A) 10/28/2021 1253  ?  TEST NOT REPORTED DUE TO COLOR INTERFERENCE OF URINE PIGMENT  ?  PROTEINUR (A) 10/28/2021 1253  ?  TEST NOT REPORTED DUE TO COLOR INTERFERENCE OF URINE PIGMENT  ? UROBILINOGEN 0.2 06/23/2016 1441  ? UROBILINOGEN 1.0 12/17/2013 0528  ? NITRITE (A) 10/28/2021 1253  ?  TEST NOT REPORTED DUE TO COLOR INTERFERENCE OF URINE PIGMENT  ? LEUKOCYTESUR (A) 10/28/2021 1253  ?  TEST NOT REPORTED DUE TO COLOR INTERFERENCE OF URINE PIGMENT  ? ? ?Lab Results  ?Component Value Date  ? LABMICR See below: 01/13/2021  ? [place] None seen 01/13/2021  ? LABEPIT 0-10 01/13/2021  ? MUCUS Present 01/13/2021  ? BACTERIA MANY (A) 10/28/2021  ? ? ?Pertinent Imaging: ?CT 10/28/2021: Images reviewed and discussed with the patient  ?No results found for this or any previous visit. ? ?No results found for this or any previous visit. ? ?No results found for this or any previous visit. ? ?No results found for this or any previous visit. ? ?No results found for this or any previous visit. ? ?No results found for this or any previous visit. ? ?No results found for this or any previous visit. ? ?Results for orders placed during the hospital encounter of 10/28/21 ? ?CT Renal Stone Study ? ?Narrative ?CLINICAL DATA:  Nephrolithiasis, hematuria. ? ?EXAM: ?CT ABDOMEN AND PELVIS WITHOUT CONTRAST ? ?TECHNIQUE: ?Multidetector CT imaging of the abdomen and pelvis was performed ?following the standard  protocol without IV contrast. ? ?RADIATION DOSE REDUCTION: This exam was performed according to the ?departmental dose-optimization program which includes automated ?exposure control, adjustment of the mA and/or kV a

## 2021-11-11 ENCOUNTER — Encounter: Payer: Self-pay | Admitting: Urology

## 2021-11-11 NOTE — Patient Instructions (Signed)
Transurethral Resection of Bladder Tumor ?Transurethral resection of a bladder tumor is the removal (resection) of a cancerous growth (tumor) on the inside wall of the bladder. The bladder is the organ that holds urine. The tumor is removed through the tube that carries urine out of the body (urethra). ?In a transurethral resection, a thin telescope with a light, a tiny camera, and an electric cutting edge (resectoscope) is passed through the urethra. In men, the opening of the urethra is at the end of the penis. In women, it is just above the opening of the vagina. ?Tell a health care provider about: ?Any allergies you have. ?All medicines you are taking, including vitamins, herbs, eye drops, creams, and over-the-counter medicines. ?Any problems you or family members have had with anesthetic medicines. ?Any blood disorders you have. ?Any surgeries you have had. ?Any medical conditions you have. ?Any recent urinary tract infections you have had. ?Whether you are pregnant or may be pregnant. ?What are the risks? ?Generally, this is a safe procedure. However, problems may occur, including: ?Infection. ?Bleeding. ?Allergic reactions to medicines. ?Damage to nearby structures or organs, such as: ?The urethra. ?The tubes that drain urine from the kidneys into the bladder (ureters). ?Pain and burning during urination. ?Difficulty urinating due to partial blockage of the urethra. ?Inability to urinate (urinary retention). ?What happens before the procedure? ?Staying hydrated ?Follow instructions from your health care provider about hydration, which may include: ?Up to 2 hours before the procedure - you may continue to drink clear liquids, such as water, clear fruit juice, black coffee, and plain tea. ? ?Eating and drinking restrictions ?Follow instructions from your health care provider about eating and drinking, which may include: ?8 hours before the procedure - stop eating heavy meals or foods, such as meat, fried foods,  or fatty foods. ?6 hours before the procedure - stop eating light meals or foods, such as toast or cereal. ?6 hours before the procedure - stop drinking milk or drinks that contain milk. ?2 hours before the procedure - stop drinking clear liquids. ?Medicines ?Ask your health care provider about: ?Changing or stopping your regular medicines. This is especially important if you are taking diabetes medicines or blood thinners. ?Taking medicines such as aspirin and ibuprofen. These medicines can thin your blood. Do not take these medicines unless your health care provider tells you to take them. ?Taking over-the-counter medicines, vitamins, herbs, and supplements. ?Tests ?You may have exams or tests, including: ?Physical exam. ?Blood tests. ?Urine tests. ?Electrocardiogram (ECG). This test measures the electrical activity of the heart. ?General instructions ?Plan to have someone take you home from the hospital or clinic. ?Ask your health care provider how your surgical site will be marked or identified. ?Ask your health care provider what steps will be taken to help prevent infection. These may include: ?Washing skin with a germ-killing soap. ?Taking antibiotic medicine. ?What happens during the procedure? ?An IV will be inserted into one of your veins. ?You will be given one or more of the following: ?A medicine to help you relax (sedative). ?A medicine to make you fall asleep (general anesthetic). ?A medicine that is injected into your spine to numb the area below and slightly above the injection site (spinal anesthetic). ?Your legs will be placed in foot rests (stirrups) so that your legs are apart and your knees are bent. ?The resectoscope will be passed through your urethra and into your bladder. ?The part of your bladder that is affected by the tumor will be  resected using the cutting edge of the resectoscope. ?The resectoscope will be removed. ?A thin, flexible tube (catheter) will be passed through your urethra  and into your bladder. The catheter will drain urine into a bag outside of your body. ?Fluid may be passed through the catheter to keep the catheter open. ?The procedure may vary among health care providers and hospitals. ?What happens after the procedure? ?Your blood pressure, heart rate, breathing rate, and blood oxygen level will be monitored until you leave the hospital or clinic. ?You may continue to receive fluids and medicines through an IV. ?You will have some pain. You will be given pain medicine to relieve pain. ?You will have a catheter to drain your urine. ?You will have blood in your urine. Your catheter may be kept in until your urine is clear. ?The amount of urine will be monitored. If necessary, your bladder may be rinsed out (irrigated) by passing fluid through your catheter. ?You will be encouraged to walk around as soon as possible. ?You may have to wear compression stockings. These stockings help to prevent blood clots and reduce swelling in your legs. ?Do not drive for 24 hours if you were given a sedative during your procedure. ?Summary ?Transurethral resection of a bladder tumor is the removal (resection) of a cancerous growth (tumor) on the inside wall of the bladder. ?To do this procedure, your health care provider uses a thin telescope with a light, a tiny camera, and an electric cutting edge (resectoscope). ?Follow your health care provider's instructions. You may need to stop or change certain medicines, and you may be told to stop eating and drinking several hours before the procedure. ?Your blood pressure, heart rate, breathing rate, and blood oxygen level will be monitored until you leave the hospital or clinic. ?You may have to wear compression stockings. These stockings help to prevent blood clots and reduce swelling in your legs. ?This information is not intended to replace advice given to you by your health care provider. Make sure you discuss any questions you have with your  health care provider. ?Document Revised: 03/01/2018 Document Reviewed: 03/02/2018 ?Elsevier Patient Education ? [place]. ? ?

## 2021-11-15 ENCOUNTER — Ambulatory Visit (INDEPENDENT_AMBULATORY_CARE_PROVIDER_SITE_OTHER): Payer: Medicare (Managed Care) | Admitting: Family Medicine

## 2021-11-15 ENCOUNTER — Ambulatory Visit: Payer: Medicare (Managed Care) | Admitting: Family Medicine

## 2021-11-15 VITALS — BP 130/74 | HR 90 | Temp 98.2°F | Resp 17 | Ht 69.0 in | Wt 191.8 lb

## 2021-11-15 DIAGNOSIS — N401 Enlarged prostate with lower urinary tract symptoms: Secondary | ICD-10-CM

## 2021-11-15 DIAGNOSIS — E1165 Type 2 diabetes mellitus with hyperglycemia: Secondary | ICD-10-CM | POA: Diagnosis not present

## 2021-11-15 DIAGNOSIS — D494 Neoplasm of unspecified behavior of bladder: Secondary | ICD-10-CM | POA: Diagnosis not present

## 2021-11-15 DIAGNOSIS — R310 Gross hematuria: Secondary | ICD-10-CM | POA: Diagnosis not present

## 2021-11-15 DIAGNOSIS — J449 Chronic obstructive pulmonary disease, unspecified: Secondary | ICD-10-CM | POA: Diagnosis not present

## 2021-11-15 DIAGNOSIS — R779 Abnormality of plasma protein, unspecified: Secondary | ICD-10-CM

## 2021-11-15 NOTE — Patient Instructions (Signed)
? ? ? ? ? ? ? ? Phillip Maynard. ? 11/15/2021  ?  ? '@PREFPERIOPPHARMACY'$ @ ? ? Your procedure is scheduled on  11/18/2021. ? ? Report to [facility] at  1330 P.M. ? ? Call this number if you have problems the morning of surgery: ? [phone] ? ? Remember: ? Do not eat or drink after midnight. ? ? ?Use your inhalers before yo come and bring your rescue inhaler with you.  ?  ? Take these medicines the morning of surgery with A SIP OF WATER  ? ?proscar. ?  ? Do not wear jewelry, make-up or nail polish. ? Do not wear lotions, powders, or perfumes, or deodorant. ? Do not shave 48 hours prior to surgery.  Men may shave face and neck. ? Do not bring valuables to the hospital. ? [facility] is not responsible for any belongings or valuables. ? ?Contacts, dentures or bridgework may not be worn into surgery.  Leave your suitcase in the car.  After surgery it may be brought to your room. ? ?For patients admitted to the hospital, discharge time will be determined by your treatment team. ? ?Patients discharged the day of surgery will not be allowed to drive home and must have someone with them for 24 hours.  ? ? ?Special instructions:   DO NOT smoke tobacco or vape for 24 hors before your procedure. ? ?Please read over the following fact sheets that you were given. ?Coughing and Deep Breathing, Surgical Site Infection Prevention, Anesthesia Post-op Instructions, and Care and Recovery After Surgery ?  ? ? ? Transurethral Resection of Bladder Tumor, Care After ?This sheet gives you information about how to care for yourself after your procedure. Your health care provider may also give you more specific instructions. If you have problems or questions, contact your health care provider. ?What can I expect after the procedure? ?After the procedure, it is common to have: ?A small amount of blood in your urine for up to 2 weeks. ?Soreness or mild pain from your catheter. After your catheter is removed, you may have mild soreness,  especially when urinating. ?Pain in your lower abdomen. ?Follow these instructions at home: ?Medicines ? ?Take over-the-counter and prescription medicines only as told by your health care provider. ?If you were prescribed an antibiotic medicine, take it as told by your health care provider. Do not stop taking the antibiotic even if you start to feel better. ?Do not drive for 24 hours if you were given a sedative during your procedure. ?Ask your health care provider if the medicine prescribed to you: ?Requires you to avoid driving or using heavy machinery. ?Can cause constipation. You may need to take these actions to prevent or treat constipation: ?Take over-the-counter or prescription medicines. ?Eat foods that are high in fiber, such as beans, whole grains, and fresh fruits and vegetables. ?Limit foods that are high in fat and processed sugars, such as fried or sweet foods. ?Activity ?Return to your normal activities as told by your health care provider. Ask your health care provider what activities are safe for you. ?Do not lift anything that is heavier than 10 lb (4.5 kg), or the limit that you are told, until your health care provider says that it is safe. ?Avoid intense physical activity for as long as told by your health care provider. ?Rest as told by your health care provider. ?Avoid sitting for a long time without moving. Get up to take short walks every 1-2 hours.  This is important to improve blood flow and breathing. Ask for help if you feel weak or unsteady. ?General instructions ? ?Do not drink alcohol for as long as told by your health care provider. This is especially important if you are taking prescription pain medicines. ?Do not take baths, swim, or use a hot tub until your health care provider approves. Ask your health care provider if you may take showers. You may only be allowed to take sponge baths. ?If you have a catheter, follow instructions from your health care provider about caring for  your catheter and your drainage bag. ?Drink enough fluid to keep your urine pale yellow. ?Wear compression stockings as told by your health care provider. These stockings help to prevent blood clots and reduce swelling in your legs. ?Keep all follow-up visits as told by your health care provider. This is important. ?You will need to be followed closely with regular checks of your bladder and urethra (cystoscopies) to make sure that the cancer does not come back. ?Contact a health care provider if: ?You have pain that gets worse or does not improve with medicine. ?You have blood in your urine for more than 2 weeks. ?You have cloudy or bad-smelling urine. ?You become constipated. Signs of constipation may include having: ?Fewer than three bowel movements in a week. ?Difficulty having a bowel movement. ?Stools that are dry, hard, or larger than normal. ?You have a fever. ?Get help right away if: ?You have: ?Severe pain. ?Bright red blood in your urine. ?Blood clots in your urine. ?A lot of blood in your urine. ?Your catheter has been removed and you are not able to urinate. ?You have a catheter in place and the catheter is not draining urine. ?Summary ?After your procedure, it is common to have a small amount of blood in your urine, soreness or mild pain from your catheter, and pain in your lower abdomen. ?Take over-the-counter and prescription medicines only as told by your health care provider. ?Rest as told by your health care provider. Follow your health care provider's instructions about returning to normal activities. Ask what activities are safe for you. ?If you have a catheter, follow instructions from your health care provider about caring for your catheter and your drainage bag. ?Get help right away if you cannot urinate, you have severe pain, or you have bright red blood or blood clots in your urine. ?This information is not intended to replace advice given to you by your health care provider. Make sure you  discuss any questions you have with your health care provider. ?Document Revised: 03/01/2018 Document Reviewed: 03/01/2018 ?Elsevier Patient Education ? [place]. ?General Anesthesia, Adult, Care After ?This sheet gives you information about how to care for yourself after your procedure. Your health care provider may also give you more specific instructions. If you have problems or questions, contact your health care provider. ?What can I expect after the procedure? ?After the procedure, the following side effects are common: ?Pain or discomfort at the IV site. ?Nausea. ?Vomiting. ?Sore throat. ?Trouble concentrating. ?Feeling cold or chills. ?Feeling weak or tired. ?Sleepiness and fatigue. ?Soreness and body aches. These side effects can affect parts of the body that were not involved in surgery. ?Follow these instructions at home: ?For the time period you were told by your health care provider: ? ?Rest. ?Do not participate in activities where you could fall or become injured. ?Do not drive or use machinery. ?Do not drink alcohol. ?Do not take sleeping pills  or medicines that cause drowsiness. ?Do not make important decisions or sign legal documents. ?Do not take care of children on your own. ?Eating and drinking ?Follow any instructions from your health care provider about eating or drinking restrictions. ?When you feel hungry, start by eating small amounts of foods that are soft and easy to digest (bland), such as toast. Gradually return to your regular diet. ?Drink enough fluid to keep your urine pale yellow. ?If you vomit, rehydrate by drinking water, juice, or clear broth. ?General instructions ?If you have sleep apnea, surgery and certain medicines can increase your risk for breathing problems. Follow instructions from your health care provider about wearing your sleep device: ?Anytime you are sleeping, including during daytime naps. ?While taking prescription pain medicines, sleeping medicines, or  medicines that make you drowsy. ?Have a responsible adult stay with you for the time you are told. It is important to have someone help care for you until you are awake and alert. ?Return to your normal activ

## 2021-11-15 NOTE — Patient Instructions (Signed)
Good luck with upcoming procedure as well as testing with pulmonary.  No medication changes today.  Return in 1 month for a lab only visit and we can check your electrolytes, protein level and the 12-monthblood sugar level.  If you are planning on having the dental procedure, please schedule a preoperative evaluation or a clearance visit so we can review that paperwork and any specific recommendations.  Take care! ?

## 2021-11-15 NOTE — Progress Notes (Signed)
? ?Subjective:  ?Patient ID: Phillip Barrette., male    DOB: January 17, 1941  Age: 81 y.o. MRN: [id number] ? ?CC:  ?Chief Complaint  ?Patient presents with  ? Diabetes  ?  Pt notes he started metformin and is urinating more frequently denies burning with urination went to ER a few weeks ago due to blood in urine saw urologist and has surgery 11/18/21   ? Results  ?  Pt due for recheck on protein level   ? ? ?HPI ?Phillip Barrette. presents for  ? ?COPD: ? ?Discussed March 1.  COPD exacerbation improving at that time.  Albuterol if needed, continued on Flovent, previously on Spiriva.  Follow-up planned with pulmonary.  Also discussed need for pulmonary recommendations with anesthesia if he does proceed with dental procedure.  ?Pulmonary eval 10/26/2021, Dr. Erin Fulling.  Ordered PFTs in next 4 to 6 weeks.  Referred for lung cancer screening.  IgE elevated, eosinophils 20.9 on CBC.  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  Plan for recheck CBC in 1 month. ?Continued Flovent, change Spiriva to Stiolto 2 puffs daily ?Feels better on stiolto.  ?Albuterol running low - only occasional use - few times per week.  ?Not winded to walk to mailbox as in past.  ? ?Diabetes: ?With increasing readings, started on metformin 500 mg last visit.  Also referred to ophthalmology. Taking metformin BID with food. Looser stool, regular no diarrhea. ?Home readings - fasting low of 150, 180 after meals. Under 200.  ?No symptomatic lows. More activity/exercise.  ?Lab Results  ?Component Value Date  ? HGBA1C 7.8 (H) 09/29/2021  ? HGBA1C 7.5 (H) 04/07/2021  ? HGBA1C 7.0 (H) 10/07/2020  ? ?Lab Results  ?Component Value Date  ? MICROALBUR 5.2 (H) 04/07/2021  ? [place] 64 09/29/2021  ? CREATININE 1.13 09/29/2021  ? ?Gross hematuria with possible bladder mass ?ER evaluation 316 with 2-day history of gross painless hematuria.  Evaluated at Dr. Alyson Ingles with [facility] health urology [place].  Started on Rapaflo 8 mg for BPH with LUTS , plan for  cystoscopy, bladder tumor resection in 3 days. ?No recent bleeding.  ? ?Elevated serum protein level ?Noted on prior labs.  Plan for repeat testing.  ? ?History ?Patient Active Problem List  ? Diagnosis Date Noted  ? COPD (chronic obstructive pulmonary disease) ([place]) 10/13/2021  ? Benign localized prostatic hyperplasia with lower urinary tract symptoms (LUTS) 01/13/2021  ? Acute cystitis with hematuria 01/13/2021  ? Nocturia 01/13/2021  ? 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/13/2021  ? Skin lesion 10/12/2018  ? Colovesical fistula s/p robotic colectomy/repair 09/08/2016 07/12/2016  ? Hypertension 02/28/2013  ? Type 2 diabetes mellitus ([place]) 02/28/2013  ? Chronic hepatitis C ([place]) 02/14/2011  ? TOBACCO ABUSE 12/10/2007  ? MERALGIA PARESTHETICA 12/10/2007  ? ?Past Medical History:  ?Diagnosis Date  ? Asthma   ? "when I was a boy" (04/08/2013)  ? Bronchitis   ? Colon polyps   ? Colovesical fistula   ? Diverticulosis   ? GERD (gastroesophageal reflux disease)   ? Hepatitis C   ? treated with injections  ? High cholesterol   ? Hypertension   ? Type II diabetes mellitus ([place])   ? hx of . No longer on medicine  ? ?Past Surgical History:  ?Procedure Laterality Date  ? COLONOSCOPY  last 09/06/2016  ? CYSTOSCOPY N/A 09/08/2016  ? Procedure: FIREFLY INJECTIONS;  Surgeon: Cleon Gustin, MD;  Location: [facility];  Service: Urology;  Laterality: N/A;  ? INCISION  AND DRAINAGE OF WOUND Right 1970's  ? "leg" (04/08/2013)  ? LIVER BIOPSY  ~ 2011  ? POLYPECTOMY    ? PROCTOSCOPY N/A 09/08/2016  ? Procedure: RIGID PROCTOSCOPY;  Surgeon: Michael Boston, MD;  Location: [facility];  Service: General;  Laterality: N/A;  ? TONSILLECTOMY    ? ?Allergies  ?Allergen Reactions  ? Penicillins Anaphylaxis and Other (See Comments)  ?  Has patient had a PCN reaction causing immediate rash, facial/tongue/throat swelling, SOB or lightheadedness with hypotension: Yes ?Has patient had a PCN reaction causing severe rash involving mucus membranes  or skin necrosis: No ?Has patient had a PCN reaction that required hospitalization No ?Has patient had a PCN reaction occurring within the last 10 years: No ?If all of the above answers are "NO", then may proceed with Cephalosporin use.  ? Shellfish Allergy Anaphylaxis  ? ?Prior to Admission medications   ?Medication Sig Start Date End Date Taking? Authorizing Provider  ?albuterol (VENTOLIN HFA) 108 (90 Base) MCG/ACT inhaler TAKE 2 PUFFS BY MOUTH EVERY 6 HOURS AS NEEDED FOR WHEEZE OR SHORTNESS OF BREATH 10/14/21  Yes Wendie Agreste, MD  ?atorvastatin (LIPITOR) 10 MG tablet TAKE 1 TABLET EVERY DAY 07/26/21  Yes Wendie Agreste, MD  ?blood glucose meter kit and supplies Dispense based on patient and insurance preference. Use up to four times daily as directed. (FOR ICD-10 E10.9, E11.9). 09/29/21  Yes Wendie Agreste, MD  ?finasteride (PROSCAR) 5 MG tablet Take 1 tablet (5 mg total) by mouth daily. 01/13/21  Yes McKenzie, Candee Furbish, MD  ?fluticasone (FLOVENT HFA) 110 MCG/ACT inhaler Inhale 1-2 puffs into the lungs in the morning and at bedtime. 05/06/21  Yes Wendie Agreste, MD  ?losartan-hydrochlorothiazide (HYZAAR) 50-12.5 MG tablet Take 1 tablet by mouth daily. 09/29/21  Yes Wendie Agreste, MD  ?metFORMIN (GLUCOPHAGE) 500 MG tablet Take 1 tablet (500 mg total) by mouth 2 (two) times daily with a meal. 10/13/21  Yes Wendie Agreste, MD  ?sildenafil (VIAGRA) 100 MG tablet Take 1 tablet (100 mg total) by mouth daily as needed for erectile dysfunction. 11/03/21  Yes McKenzie, Candee Furbish, MD  ?silodosin (RAPAFLO) 8 MG CAPS capsule Take 1 capsule (8 mg total) by mouth at bedtime. 11/03/21  Yes McKenzie, Candee Furbish, MD  ?Tiotropium Bromide-Olodaterol (STIOLTO RESPIMAT) 2.5-2.5 MCG/ACT AERS Inhale 2 puffs into the lungs daily. 10/26/21  Yes Freddi Starr, MD  ?sildenafil (REVATIO) 20 MG tablet Take 1 tablet (20 mg total) by mouth as needed. ?Patient not taking: Reported on 11/03/2021 01/13/21   Cleon Gustin, MD   ?tamsulosin (FLOMAX) 0.4 MG CAPS capsule Take 2 capsules (0.8 mg total) by mouth at bedtime. ?Patient not taking: Reported on 11/11/2021 01/13/21   Cleon Gustin, MD  ? ?Social History  ? ?Socioeconomic History  ? Marital status: Married  ?  Spouse name: Not on file  ? Number of children: 2  ? Years of education: Not on file  ? Highest education level: Not on file  ?Occupational History  ? Occupation: truck Geophysicist/field seismologist  ?  Employer: [employer]  ?  Comment: retired  ?Tobacco Use  ? Smoking status: Former  ?  Packs/day: 0.25  ?  Years: 45.00  ?  Pack years: 11.25  ?  Types: Cigarettes  ?  Quit date: 03/05/2021  ?  Years since quitting: 0.6  ? Smokeless tobacco: Never  ?Vaping Use  ? Vaping Use: Never used  ?Substance and Sexual Activity  ? Alcohol use:  Yes  ?  Alcohol/week: 12.0 standard drinks  ?  Types: 12 Cans of beer per week  ?  Comment: 12 pack a week.  ? Drug use: No  ? Sexual activity: Yes  ?Other Topics Concern  ? Not on file  ?Social History Narrative  ? Pt lives in 1 story home with his wife  ? Has 2 adult children  ? Highest level of education: GED & Trade school  ? Retired Media planner.   ? ?Social Determinants of Health  ? ?Financial Resource Strain: Low Risk   ? Difficulty of Paying Living Expenses: Not hard at all  ?Food Insecurity: No Food Insecurity  ? Worried About Charity fundraiser in the Last Year: Never true  ? Ran Out of Food in the Last Year: Never true  ?Transportation Needs: No Transportation Needs  ? Lack of Transportation (Medical): No  ? Lack of Transportation (Non-Medical): No  ?Physical Activity: Inactive  ? Days of Exercise per Week: 0 days  ? Minutes of Exercise per Session: 0 min  ?Stress: No Stress Concern Present  ? Feeling of Stress : Not at all  ?Social Connections: Moderately Integrated  ? Frequency of Communication with Friends and Family: Twice a week  ? Frequency of Social Gatherings with Friends and Family: Twice a week  ? Attends Religious Services: 1 to 4  times per year  ? Active Member of Clubs or Organizations: No  ? Attends Archivist Meetings: Never  ? Marital Status: Married  ?Intimate Partner Violence: Not At Risk  ? Fear of Current or Ex-Partner: No

## 2021-11-16 ENCOUNTER — Encounter: Payer: Self-pay | Admitting: Family Medicine

## 2021-11-16 ENCOUNTER — Encounter (HOSPITAL_COMMUNITY)
Admission: RE | Admit: 2021-11-16 | Discharge: 2021-11-16 | Disposition: A | Discharge status: Home / Self Care | Payer: Medicare (Managed Care) | Source: Ambulatory Visit | Attending: Urology | Admitting: Urology

## 2021-11-16 ENCOUNTER — Encounter (HOSPITAL_COMMUNITY): Payer: Self-pay

## 2021-11-16 ENCOUNTER — Other Ambulatory Visit: Payer: Self-pay

## 2021-11-16 HISTORY — DX: Chronic obstructive pulmonary disease, unspecified: J44.9

## 2021-11-17 MED ORDER — GENTAMICIN SULFATE 40 MG/ML IJ SOLN
5.0000 mg/kg | INTRAVENOUS | Status: AC
  Administered 2021-11-18: 350 mg via INTRAVENOUS
  Filled 2021-11-17: qty 8.75

## 2021-11-18 ENCOUNTER — Ambulatory Visit (HOSPITAL_COMMUNITY)
Admission: RE | Admit: 2021-11-18 | Discharge: 2021-11-18 | Disposition: A | Discharge status: Home / Self Care | Payer: Medicare (Managed Care) | Attending: Urology | Admitting: Urology

## 2021-11-18 ENCOUNTER — Encounter (HOSPITAL_COMMUNITY): Payer: Self-pay | Admitting: Urology

## 2021-11-18 ENCOUNTER — Ambulatory Visit (HOSPITAL_BASED_OUTPATIENT_CLINIC_OR_DEPARTMENT_OTHER): Payer: Medicare (Managed Care) | Admitting: Anesthesiology

## 2021-11-18 ENCOUNTER — Ambulatory Visit (HOSPITAL_COMMUNITY): Payer: Medicare (Managed Care)

## 2021-11-18 ENCOUNTER — Encounter (HOSPITAL_COMMUNITY)
Admission: RE | Disposition: A | Discharge status: Home / Self Care | Payer: Self-pay | Source: Home / Self Care | Attending: Urology

## 2021-11-18 ENCOUNTER — Ambulatory Visit (HOSPITAL_COMMUNITY): Payer: Medicare (Managed Care) | Admitting: Anesthesiology

## 2021-11-18 DIAGNOSIS — I10 Essential (primary) hypertension: Secondary | ICD-10-CM | POA: Insufficient documentation

## 2021-11-18 DIAGNOSIS — N401 Enlarged prostate with lower urinary tract symptoms: Secondary | ICD-10-CM | POA: Insufficient documentation

## 2021-11-18 DIAGNOSIS — C671 Malignant neoplasm of dome of bladder: Secondary | ICD-10-CM | POA: Insufficient documentation

## 2021-11-18 DIAGNOSIS — Z87891 Personal history of nicotine dependence: Secondary | ICD-10-CM | POA: Diagnosis not present

## 2021-11-18 DIAGNOSIS — R310 Gross hematuria: Secondary | ICD-10-CM | POA: Diagnosis not present

## 2021-11-18 DIAGNOSIS — J449 Chronic obstructive pulmonary disease, unspecified: Secondary | ICD-10-CM | POA: Diagnosis not present

## 2021-11-18 DIAGNOSIS — Z9049 Acquired absence of other specified parts of digestive tract: Secondary | ICD-10-CM | POA: Diagnosis not present

## 2021-11-18 DIAGNOSIS — C679 Malignant neoplasm of bladder, unspecified: Secondary | ICD-10-CM | POA: Diagnosis not present

## 2021-11-18 DIAGNOSIS — D494 Neoplasm of unspecified behavior of bladder: Secondary | ICD-10-CM

## 2021-11-18 DIAGNOSIS — Z7984 Long term (current) use of oral hypoglycemic drugs: Secondary | ICD-10-CM | POA: Diagnosis not present

## 2021-11-18 DIAGNOSIS — E119 Type 2 diabetes mellitus without complications: Secondary | ICD-10-CM | POA: Insufficient documentation

## 2021-11-18 DIAGNOSIS — K219 Gastro-esophageal reflux disease without esophagitis: Secondary | ICD-10-CM | POA: Insufficient documentation

## 2021-11-18 DIAGNOSIS — R351 Nocturia: Secondary | ICD-10-CM | POA: Diagnosis not present

## 2021-11-18 HISTORY — PX: TRANSURETHRAL RESECTION OF BLADDER TUMOR: SHX2575

## 2021-11-18 HISTORY — PX: CYSTOSCOPY W/ RETROGRADES: SHX1426

## 2021-11-18 LAB — GLUCOSE, CAPILLARY: Glucose-Capillary: 105 mg/dL — ABNORMAL HIGH (ref 70–99)

## 2021-11-18 SURGERY — CYSTOSCOPY, WITH RETROGRADE PYELOGRAM
Anesthesia: General | Site: Renal

## 2021-11-18 MED ORDER — CHLORHEXIDINE GLUCONATE 0.12 % MT SOLN
15.0000 mL | Freq: Once | OROMUCOSAL | Status: AC
  Administered 2021-11-18: 15 mL via OROMUCOSAL

## 2021-11-18 MED ORDER — LACTATED RINGERS IV SOLN: INTRAVENOUS | Status: DC

## 2021-11-18 MED ORDER — STERILE WATER FOR IRRIGATION IR SOLN
Status: DC | PRN
  Administered 2021-11-18: 500 mL

## 2021-11-18 MED ORDER — OXYCODONE-ACETAMINOPHEN 5-325 MG PO TABS: 1.0000 | ORAL_TABLET | ORAL | 0 refills | Status: AC | PRN

## 2021-11-18 MED ORDER — SEVOFLURANE IN SOLN
RESPIRATORY_TRACT | Status: AC
  Filled 2021-11-18: qty 250

## 2021-11-18 MED ORDER — SUGAMMADEX SODIUM 200 MG/2ML IV SOLN
INTRAVENOUS | Status: DC | PRN
  Administered 2021-11-18: 174 mg via INTRAVENOUS

## 2021-11-18 MED ORDER — HYDROMORPHONE HCL 1 MG/ML IJ SOLN: 0.2500 mg | INTRAMUSCULAR | Status: DC | PRN

## 2021-11-18 MED ORDER — FENTANYL CITRATE (PF) 250 MCG/5ML IJ SOLN
INTRAMUSCULAR | Status: DC | PRN
  Administered 2021-11-18: 100 ug via INTRAVENOUS

## 2021-11-18 MED ORDER — ONDANSETRON HCL 4 MG/2ML IJ SOLN: 4.0000 mg | Freq: Once | INTRAMUSCULAR | Status: DC | PRN

## 2021-11-18 MED ORDER — SODIUM CHLORIDE 0.9 % IR SOLN
Status: DC | PRN
  Administered 2021-11-18 (×2): 3000 mL

## 2021-11-18 MED ORDER — ORAL CARE MOUTH RINSE: 15.0000 mL | Freq: Once | OROMUCOSAL | Status: AC

## 2021-11-18 MED ORDER — FENTANYL CITRATE (PF) 100 MCG/2ML IJ SOLN
INTRAMUSCULAR | Status: AC
  Filled 2021-11-18: qty 2

## 2021-11-18 MED ORDER — ROCURONIUM BROMIDE 10 MG/ML (PF) SYRINGE
PREFILLED_SYRINGE | INTRAVENOUS | Status: DC | PRN
Start: 2021-11-18 — End: 2021-11-18
  Administered 2021-11-18: 50 mg via INTRAVENOUS

## 2021-11-18 MED ORDER — LIDOCAINE HCL (PF) 2 % IJ SOLN
INTRAMUSCULAR | Status: AC
  Filled 2021-11-18: qty 5

## 2021-11-18 MED ORDER — PROPOFOL 10 MG/ML IV BOLUS
INTRAVENOUS | Status: AC
  Filled 2021-11-18: qty 20

## 2021-11-18 MED ORDER — DIATRIZOATE MEGLUMINE 30 % UR SOLN
URETHRAL | Status: DC | PRN
  Administered 2021-11-18: 10 mL via URETHRAL

## 2021-11-18 MED ORDER — SUGAMMADEX SODIUM 500 MG/5ML IV SOLN
INTRAVENOUS | Status: AC
  Filled 2021-11-18: qty 5

## 2021-11-18 MED ORDER — ONDANSETRON HCL 4 MG/2ML IJ SOLN
INTRAMUSCULAR | Status: DC | PRN
  Administered 2021-11-18: 4 mg via INTRAVENOUS

## 2021-11-18 MED ORDER — PROPOFOL 10 MG/ML IV BOLUS
INTRAVENOUS | Status: DC | PRN
  Administered 2021-11-18: 150 mg via INTRAVENOUS

## 2021-11-18 MED ORDER — EPHEDRINE SULFATE-NACL 50-0.9 MG/10ML-% IV SOSY
PREFILLED_SYRINGE | INTRAVENOUS | Status: DC | PRN
  Administered 2021-11-18: 5 mg via INTRAVENOUS

## 2021-11-18 MED ORDER — DEXAMETHASONE SODIUM PHOSPHATE 10 MG/ML IJ SOLN
INTRAMUSCULAR | Status: DC | PRN
  Administered 2021-11-18: 5 mg via INTRAVENOUS

## 2021-11-18 SURGICAL SUPPLY — 26 items
BAG DRAIN URO TABLE W/ADPT NS (BAG) ×4 IMPLANT
BAG DRN 8 ADPR NS SKTRN CSTL (BAG) ×2
BAG DRN RND TRDRP ANRFLXCHMBR (UROLOGICAL SUPPLIES) ×2
BAG HAMPER (MISCELLANEOUS) ×4 IMPLANT
BAG URINE DRAIN 2000ML AR STRL (UROLOGICAL SUPPLIES) ×4 IMPLANT
CATH FOLEY 3WAY 30CC 22F (CATHETERS) ×1 IMPLANT
CATH INTERMIT  6FR 70CM (CATHETERS) ×4 IMPLANT
CLOTH BEACON ORANGE TIMEOUT ST (SAFETY) ×4 IMPLANT
DECANTER SPIKE VIAL GLASS SM (MISCELLANEOUS) ×4 IMPLANT
ELECT LOOP 22F BIPOLAR SML (ELECTROSURGICAL) ×3
ELECTRODE LOOP 22F BIPOLAR SML (ELECTROSURGICAL) ×3 IMPLANT
GLOVE SURG LTX SZ7 (GLOVE) ×1 IMPLANT
GLOVE SURG POLYISO LF SZ8 (GLOVE) ×4 IMPLANT
GLOVE SURG UNDER POLY LF SZ7 (GLOVE) ×9 IMPLANT
GOWN STRL REUS W/TWL LRG LVL3 (GOWN DISPOSABLE) ×5 IMPLANT
GOWN STRL REUS W/TWL XL LVL3 (GOWN DISPOSABLE) ×4 IMPLANT
IV NS IRRIG 3000ML ARTHROMATIC (IV SOLUTION) ×8 IMPLANT
KIT TURNOVER CYSTO (KITS) ×4 IMPLANT
MANIFOLD NEPTUNE II (INSTRUMENTS) ×4 IMPLANT
PACK CYSTO (CUSTOM PROCEDURE TRAY) ×4 IMPLANT
PAD ARMBOARD 7.5X6 YLW CONV (MISCELLANEOUS) ×4 IMPLANT
PLUG CATH AND CAP STER (CATHETERS) ×1 IMPLANT
SYR TOOMEY IRRIG 70ML (MISCELLANEOUS) ×3
SYRINGE TOOMEY IRRIG 70ML (MISCELLANEOUS) IMPLANT
TOWEL OR 17X26 4PK STRL BLUE (TOWEL DISPOSABLE) ×4 IMPLANT
WATER STERILE IRR 500ML POUR (IV SOLUTION) ×4 IMPLANT

## 2021-11-18 NOTE — Anesthesia Preprocedure Evaluation (Signed)
Anesthesia Evaluation  ?Patient identified by MRN, date of birth, ID band ?Patient awake ? ? ? ?Reviewed: ?Allergy & Precautions, NPO status , Patient's Chart, lab work & pertinent test results ? ?Airway ?Mallampati: II ? ?TM Distance: >3 FB ?Neck ROM: Full ? ? ? Dental ? ?(+) Dental Advisory Given, Missing ?  ?Pulmonary ?asthma , COPD,  COPD inhaler, former smoker,  ?  ?Pulmonary exam normal ?breath sounds clear to auscultation ? ? ? ? ? ? Cardiovascular ?Exercise Tolerance: Good ?hypertension, Pt. on medications ?Normal cardiovascular exam ?Rhythm:Regular Rate:Normal ? ? ?  ?Neuro/Psych ? Neuromuscular disease negative psych ROS  ? GI/Hepatic ?GERD  Medicated and Controlled,(+) Hepatitis -, CColovesical fistula s/p robotic colectomy/repair 09/08/2016 ?  ?Endo/Other  ?negative endocrine ROSdiabetes, Well Controlled, Type 2, Oral Hypoglycemic Agents ? Renal/GU ?negative Renal ROS  ? ?Colovesical fistula s/p robotic colectomy/repair 09/08/2016 ? ?  ?Musculoskeletal ?negative musculoskeletal ROS ?(+)  ? Abdominal ?  ?Peds ?negative pediatric ROS ?(+)  Hematology ?negative hematology ROS ?(+)   ?Anesthesia Other Findings ? ? Reproductive/Obstetrics ?negative OB ROS ? ?  ? ? ? ? ? ? ? ? ? ? ? ? ? ?  ?  ? ? ? ? ? ? ? ? ?Anesthesia Physical ?Anesthesia Plan ? ?ASA: 2 ? ?Anesthesia Plan: General  ? ?Post-op Pain Management: Dilaudid IV  ? ?Induction: Intravenous ? ?PONV Risk Score and Plan: 4 or greater and Ondansetron and Dexamethasone ? ?Airway Management Planned: Oral ETT ? ?Additional Equipment:  ? ?Intra-op Plan:  ? ?Post-operative Plan: Extubation in OR ? ?Informed Consent: I have reviewed the patients History and Physical, chart, labs and discussed the procedure including the risks, benefits and alternatives for the proposed anesthesia with the patient or authorized representative who has indicated his/her understanding and acceptance.  ? ? ? ?Dental advisory given ? ?Plan Discussed  with: CRNA and Surgeon ? ?Anesthesia Plan Comments:   ? ? ? ? ? ? ?Anesthesia Quick Evaluation ? ?

## 2021-11-18 NOTE — Progress Notes (Signed)
Catheter care teaching done with patient and spouse Helene Kelp. Verbalized understanding.  Extra alcohol preps and chux given to take home ?

## 2021-11-18 NOTE — Anesthesia Procedure Notes (Signed)
Procedure Name: Intubation ?Date/Time: 11/18/2021 3:11 PM ?Performed by: Maude Leriche, CRNA ?Pre-anesthesia Checklist: Patient identified, Emergency Drugs available, Suction available and Patient being monitored ?Patient Re-evaluated:Patient Re-evaluated prior to induction ?Oxygen Delivery Method: Circle system utilized ?Preoxygenation: Pre-oxygenation with 100% oxygen ?Induction Type: IV induction ?Ventilation: Mask ventilation without difficulty ?Laryngoscope Size: Sabra Heck and 2 ?Grade View: Grade I ?Tube type: Oral ?Tube size: 7.0 mm ?Number of attempts: 1 ?Airway Equipment and Method: Stylet and Oral airway ?Placement Confirmation: ETT inserted through vocal cords under direct vision, positive ETCO2 and breath sounds checked- equal and bilateral ?Secured at: 23 cm ?Tube secured with: Tape ?Dental Injury: Teeth and Oropharynx as per pre-operative assessment  ? ? ? ? ?

## 2021-11-18 NOTE — Transfer of Care (Signed)
Immediate Anesthesia Transfer of Care Note ? ?Patient: Phillip Maynard. ? ?Procedure(s) Performed: CYSTOSCOPY WITH RETROGRADE PYELOGRAM (Bilateral: Renal) ?TRANSURETHRAL RESECTION OF BLADDER TUMOR (TURBT) (Bladder) ? ?Patient Location: PACU ? ?Anesthesia Type:General ? ?Level of Consciousness: awake, alert  and oriented ? ?Airway & Oxygen Therapy: Patient Spontanous Breathing ? ?Post-op Assessment: Report given to RN, Post -op Vital signs reviewed and stable, Patient moving all extremities X 4 and Patient able to stick tongue midline ? ?Post vital signs: Reviewed ? ?Last Vitals:  ?Vitals Value Taken Time  ?BP 161/66   ?Temp 98.0   ?Pulse 55 11/18/21 1559  ?Resp 18 11/18/21 1559  ?SpO2 95 % 11/18/21 1559  ?Vitals shown include unvalidated device data. ? ?Last Pain:  ?Vitals:  ? 11/18/21 1347  ?TempSrc: Oral  ?PainSc: 0-No pain  ?   ? ?Patients Stated Pain Goal: 6 (11/18/21 1347) ? ?Complications: No notable events documented. ?

## 2021-11-18 NOTE — Addendum Note (Signed)
Addendum  created 11/18/21 1655 by Sammie Bench, CRNA  ? Intraprocedure Staff edited  ?  ?

## 2021-11-18 NOTE — Interval H&P Note (Signed)
History and Physical Interval Note: ? ?11/18/2021 ?2:32 PM ? ?Phillip Maynard.  has presented today for surgery, with the diagnosis of bladder tumor.  The various methods of treatment have been discussed with the patient and family. After consideration of risks, benefits and other options for treatment, the patient has consented to  Procedure(s): ?CYSTOSCOPY WITH RETROGRADE PYELOGRAM (Bilateral) ?TRANSURETHRAL RESECTION OF BLADDER TUMOR (TURBT) (N/A) as a surgical intervention.  The patient's history has been reviewed, patient examined, no change in status, stable for surgery.  I have reviewed the patient's chart and labs.  Questions were answered to the patient's satisfaction.   ? ? ?Nicolette Bang ? ? ?

## 2021-11-18 NOTE — Op Note (Signed)
.  Preoperative diagnosis: bladder tumor ? ?Postoperative diagnosis: Same ? ?Procedure: 1 cystoscopy ?2. bilateral retrograde pyelography ?3.  Intraoperative fluoroscopy, under one hour, with interpretation ?4. Transurethral resection of bladder tumor, medium ? ?Attending: Rosie Fate ? ?Anesthesia: General ? ?Estimated blood loss: Minimal ? ?Drains: 22 French foley ? ?Specimens: bladder tumor ? ?Antibiotics: gentamicin ? ?Findings: 3cm papillary posterior dome ll tumor.  Ureteral orifices in normal anatomic location. Nor hydronephrosis or filling defects in either collecting system ? ?Indications: Patient is a 81 year old male with a history of bladder tumor and gross hematuria.  After discussing treatment options, they decided proceed with transurethral resection of a bladder tumor. ? ?Procedure in detail: The patient was brought to the operating room and a brief timeout was done to ensure correct patient, correct procedure, correct site.  General anesthesia was administered patient was placed in dorsal lithotomy position.  Their genitalia was then prepped and draped in usual sterile fashion.  A rigid 69 French cystoscope was passed in the urethra and the bladder.  Bladder was inspected and we noted a 3cm bladder tumor.  the ureteral orifices were in the normal orthotopic locations.  a 6 french ureteral catheter was then instilled into the left ureteral orifice.  a gentle retrograde was obtained and findings noted above. We then turned our attention to the right side. a 6 french ureteral catheter was then instilled into the right ureteral orifice.  a gentle retrograde was obtained and findings noted above. We then removed the cystoscope and placed a resectoscope into the bladder. We proceeded to remove the large clot burden from the bladder. Once this was complete we turned our attention to the bladder tumor. Using the bipolar resectoscope we removed the bladder tumor down to the base. A subsequent muscle deep  biopsy was then taken. Hemostasis was then obtained with electrocautery. We then removed the bladder tumor chips and sent them for pathology. We then re-inspected the bladder and found no residula bleeding.  the bladder was then drained, a 22 French foley was placed and this concluded the procedure which was well tolerated by patient. ? ?Complications: None ? ?Condition: Stable, extubated, transferred to PACU ? ?Plan: Patient is to be discharged home and followup in 5 days for foley catheter removal and pathology discussion. ? ?

## 2021-11-18 NOTE — Anesthesia Postprocedure Evaluation (Signed)
Anesthesia Post Note ? ?Patient: Amardeep Beckers. ? ?Procedure(s) Performed: CYSTOSCOPY WITH RETROGRADE PYELOGRAM (Bilateral: Renal) ?TRANSURETHRAL RESECTION OF BLADDER TUMOR (TURBT) (Bladder) ? ?Patient location during evaluation: PACU ?Anesthesia Type: General ?Level of consciousness: awake and alert and oriented ?Pain management: pain level controlled ?Vital Signs Assessment: post-procedure vital signs reviewed and stable ?Respiratory status: spontaneous breathing, nonlabored ventilation and respiratory function stable ?Cardiovascular status: blood pressure returned to baseline and stable ?Postop Assessment: no apparent nausea or vomiting ?Anesthetic complications: no ? ? ?No notable events documented. ? ? ?Last Vitals:  ?Vitals:  ? 11/18/21 1615 11/18/21 1622  ?BP: (!) 154/87 (!) 144/65  ?Pulse: (!) 58   ?Resp: 14 18  ?Temp:  36.7 ?C  ?SpO2: 95% 94%  ?  ?Last Pain:  ?Vitals:  ? 11/18/21 1622  ?TempSrc:   ?PainSc: 0-No pain  ? ? ?  ?  ?  ?  ?  ?  ? ?Kathan Kirker C Zain Lankford ? ? ? ? ?

## 2021-11-22 LAB — SURGICAL PATHOLOGY

## 2021-11-23 ENCOUNTER — Encounter (HOSPITAL_COMMUNITY): Payer: Self-pay | Admitting: Urology

## 2021-11-24 ENCOUNTER — Ambulatory Visit (INDEPENDENT_AMBULATORY_CARE_PROVIDER_SITE_OTHER): Payer: Medicare (Managed Care) | Admitting: Physician Assistant

## 2021-11-24 VITALS — BP 138/70 | HR 63 | Ht 68.0 in | Wt 192.0 lb

## 2021-11-24 DIAGNOSIS — N401 Enlarged prostate with lower urinary tract symptoms: Secondary | ICD-10-CM

## 2021-11-24 DIAGNOSIS — K5903 Drug induced constipation: Secondary | ICD-10-CM

## 2021-11-24 DIAGNOSIS — Z8603 Personal history of neoplasm of uncertain behavior: Secondary | ICD-10-CM

## 2021-11-24 DIAGNOSIS — Z9889 Other specified postprocedural states: Secondary | ICD-10-CM

## 2021-11-24 DIAGNOSIS — R339 Retention of urine, unspecified: Secondary | ICD-10-CM

## 2021-11-24 LAB — BLADDER SCAN AMB NON-IMAGING
Scan Result: 137
Scan Result: 64

## 2021-11-24 NOTE — Progress Notes (Signed)
Fill and Pull Catheter Removal ? ?Patient is present today for a catheter removal.  Patient was cleaned and prepped in a sterile fashion 170m of sterile water/ saline was instilled into the bladder when the patient felt the urge to urinate. 330mof water was then drained from the balloon.  A 22FR foley cath was removed from the bladder no complications were noted .  Patient as then given some time to void on their own.  Patient can void  5063mn their own after some time.  Patient tolerated well. ? ?Performed by: KouLevi AlandMA ? ?Follow up/ Additional notes: Follow up as scheduled.   ? ?post void residual= 32m82matient returned for pvr ?post void residual =137mL50m

## 2021-11-24 NOTE — Patient Instructions (Signed)
While on pain medications: Take Senakot-S as directed on the over the counter bottle ?Take Miralax every night until you feel constipation totally resolved ?

## 2021-11-24 NOTE — Progress Notes (Signed)
? ?Assessment: ?1. History of transurethral resection of bladder tumor (TURBT)   ?2. Urinary retention   ?3. Benign localized prostatic hyperplasia with lower urinary tract symptoms (LUTS)   ?4. Drug-induced constipation   ? ? ?Plan: ?Discussed the pathology report and urinary retention with the patient and his wife.  He was unable to void more than 50 mL in the office and will return home, hydrate, take Rapaflo, and return to the office mid afternoon for bladder scan and possibly reinsertion of the Foley if he is unable to void on his own.  He understands to follow-up sooner if he develops discomfort or severe urgency to void without success. ? ?Patient is vies to continue Rapaflo as well as his finasteride and patient is advised to take Senokot S as long as he is taking narcotic pain medications.  We will discontinue this once he stops taking pain medication.  MiraLAX, 1 dose at night until constipation symptoms resolved.  Follow-up is already scheduled with Dr. Alyson Ingles. ? ? ?Chief Complaint: ?Voiding trial ? ?HPI: ?Phillip Maynard. is a 81 y.o. male who presents for postop voiding trial, status post TURBT performed on 11/18/2021.  He has been doing well except for some penile burning with the Foley.  Gross hematuria is slowly resolving.  No other complaints today.  He does admit that he Rapaflo for the past few days because he misunderstood why he was taking it.  Prior to surgery it had helped significantly with his nocturia caused by BPH.  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 ? ?At follow-up later today, the patient has been able to void without difficulty.  PVR = 137 mL ?The patient and his wife have additional questions concerning constipation he has had since the procedure.  He was advised to take a stool softener but wanted to inquire recommendations with this before taking it.  No history of constipation prior to  procedure.  He has had 2 small bowel movements since the TURBT. ? ?Portions of the above documentation were copied from a prior visit for review purposes only. ? ?Allergies: ?Allergies  ?Allergen Reactions  ? Penicillins Anaphylaxis and Other (See Comments)  ?  Has patient had a PCN reaction causing immediate rash, facial/tongue/throat swelling, SOB or lightheadedness with hypotension: Yes ?Has patient had a PCN reaction causing severe rash involving mucus membranes or skin necrosis: No ?Has patient had a PCN reaction that required hospitalization No ?Has patient had a PCN reaction occurring within the last 10 years: No ?If all of the above answers are "NO", then may proceed with Cephalosporin use.  ? Shellfish Allergy Anaphylaxis  ? ? ?PMH: ?Past Medical History:  ?Diagnosis Date  ? Asthma   ? "when I was a boy" (04/08/2013)  ? Bronchitis   ? Colon polyps   ? Colovesical fistula   ? COPD (chronic obstructive pulmonary disease) ([place])   ? Diverticulosis   ? GERD (gastroesophageal reflux disease)   ? Hepatitis C   ? treated with injections  ? High cholesterol   ? Hypertension   ? Type II diabetes mellitus ([place])   ? hx of . No longer on medicine  ? ? ?PSH: ?Past Surgical History:  ?Procedure Laterality Date  ? COLONOSCOPY  last 09/06/2016  ? CYSTOSCOPY N/A 09/08/2016  ? Procedure: FIREFLY INJECTIONS;  Surgeon: Cleon Gustin, MD;  Location: [facility];  Service: Urology;  Laterality: N/A;  ? CYSTOSCOPY W/ RETROGRADES Bilateral 11/18/2021  ? Procedure:  CYSTOSCOPY WITH RETROGRADE PYELOGRAM;  Surgeon: Cleon Gustin, MD;  Location: [facility];  Service: Urology;  Laterality: Bilateral;  ? INCISION AND DRAINAGE OF WOUND Right 1970's  ? "leg" (04/08/2013)  ? LIVER BIOPSY  ~ 2011  ? POLYPECTOMY    ? PROCTOSCOPY N/A 09/08/2016  ? Procedure: RIGID PROCTOSCOPY;  Surgeon: Michael Boston, MD;  Location: [facility];  Service: General;  Laterality: N/A;  ? TONSILLECTOMY    ? 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  ? Procedure:  TRANSURETHRAL RESECTION OF BLADDER TUMOR (TURBT);  Surgeon: Cleon Gustin, MD;  Location: [facility];  Service: Urology;  Laterality: N/A;  ? ? ?SH: ?Social History  ? ?Tobacco Use  ? Smoking status: Former  ?  Packs/day: 0.25  ?  Years: 45.00  ?  Pack years: 11.25  ?  Types: Cigarettes  ?  Quit date: 03/05/2021  ?  Years since quitting: 0.7  ? Smokeless tobacco: Never  ?Vaping Use  ? Vaping Use: Never used  ?Substance Use Topics  ? Alcohol use: Yes  ?  Alcohol/week: 12.0 standard drinks  ?  Types: 12 Cans of beer per week  ?  Comment: 12 pack a week.  ? Drug use: No  ? ? ?ROS: ?Constitutional:  Negative for fever, chills, weight loss ?CV: Negative for chest pain ?Respiratory:  Negative for shortness of breath, wheezing, sleep apnea, frequent cough ?GI:  Negative for nausea, vomiting, bloody stool, GERD ? ?PE: ?BP 138/70   Pulse 63   Ht '5\' 8"'$  (1.727 m)   Wt 192 lb (87.1 kg)   BMI 29.19 kg/m?  ?GENERAL APPEARANCE:  Well appearing, well developed, well nourished, NAD ?HEENT:  Atraumatic, normocephalic ?NECK:  Supple. Trachea midline ?ABDOMEN:  Soft, non-tender, no masses ?EXTREMITIES:  Moves all extremities well, without clubbing, cyanosis, or edema ?NEUROLOGIC:  Alert and oriented x 3, normal gait, CN II-XII grossly intact ?MENTAL STATUS:  appropriate ?BACK:  Non-tender to palpation, No CVAT ?SKIN:  Warm, dry, and intact ? ? ?Results: ?Laboratory Data: ?Lab Results  ?Component Value Date  ? WBC 8.4 10/26/2021  ? HGB 14.0 10/26/2021  ? HCT 41.5 10/26/2021  ? MCV 91.1 10/26/2021  ? PLT 289.0 10/26/2021  ? ? ?Lab Results  ?Component Value Date  ? CREATININE 1.13 09/29/2021  ? ? ?Lab Results  ?Component Value Date  ? PSA 0.9 05/23/2016  ? PSA 3.16 09/23/2015  ? ? ?No results found for: TESTOSTERONE ? ?Lab Results  ?Component Value Date  ? HGBA1C 7.8 (H) 09/29/2021  ? ? ?Urinalysis ?   ?Component Value Date/Time  ? COLORURINE RED (A) 10/28/2021 1253  ? APPEARANCEUR Clear 11/03/2021 1136  ? LABSPEC  10/28/2021 1253  ?   TEST NOT REPORTED DUE TO COLOR INTERFERENCE OF URINE PIGMENT  ? PHURINE  10/28/2021 1253  ?  TEST NOT REPORTED DUE TO COLOR INTERFERENCE OF URINE PIGMENT  ? GLUCOSEU Negative 11/03/2021 1136  ? HGBUR (A) 10/28/2021 1253  ?  TEST NOT REPORTED DUE TO COLOR INTERFERENCE OF URINE PIGMENT  ? BILIRUBINUR Negative 11/03/2021 1136  ? KETONESUR (A) 10/28/2021 1253  ?  TEST NOT REPORTED DUE TO COLOR INTERFERENCE OF URINE PIGMENT  ? PROTEINUR 1+ (A) 11/03/2021 1136  ? PROTEINUR (A) 10/28/2021 1253  ?  TEST NOT REPORTED DUE TO COLOR INTERFERENCE OF URINE PIGMENT  ? UROBILINOGEN 0.2 06/23/2016 1441  ? UROBILINOGEN 1.0 12/17/2013 0528  ? NITRITE Negative 11/03/2021 1136  ? NITRITE (A) 10/28/2021 1253  ?  TEST  NOT REPORTED DUE TO COLOR INTERFERENCE OF URINE PIGMENT  ? LEUKOCYTESUR Negative 11/03/2021 1136  ? LEUKOCYTESUR (A) 10/28/2021 1253  ?  TEST NOT REPORTED DUE TO COLOR INTERFERENCE OF URINE PIGMENT  ? ? ?Lab Results  ?Component Value Date  ? LABMICR See below: 11/03/2021  ? [place] None seen 11/03/2021  ? LABEPIT 0-10 11/03/2021  ? MUCUS Present 11/03/2021  ? BACTERIA Few 11/03/2021  ? ? ?Pertinent Imaging: ?No results found for this or any previous visit. ? ?No results found for this or any previous visit. ? ?No results found for this or any previous visit. ? ?No results found for this or any previous visit. ? ?No results found for this or any previous visit. ? ?No results found for this or any previous visit. ? ?No results found for this or any previous visit. ? ?Results for orders placed during the hospital encounter of 10/28/21 ? ?CT Renal Stone Study ? ?Narrative ?CLINICAL DATA:  Nephrolithiasis, hematuria. ? ?EXAM: ?CT ABDOMEN AND PELVIS WITHOUT CONTRAST ? ?TECHNIQUE: ?Multidetector CT imaging of the abdomen and pelvis was performed ?following the standard protocol without IV contrast. ? ?RADIATION DOSE REDUCTION: This exam was performed according to the ?departmental dose-optimization program which includes  automated ?exposure control, adjustment of the mA and/or kV according to ?patient size and/or use of iterative reconstruction technique. ? ?COMPARISON:  CT Dec 17, 2013 and January 14, 2016 ? ?FINDINGS: ?Lower chest: Bibasilar

## 2021-12-02 ENCOUNTER — Telehealth: Payer: Self-pay | Admitting: Family Medicine

## 2021-12-02 NOTE — Telephone Encounter (Signed)
Pt called in asking for a refill on the Stiolto Respimat 2.5, pt uses [facility] [address]  ? ?Please advise pt is almost out  ?

## 2021-12-03 ENCOUNTER — Other Ambulatory Visit: Payer: Self-pay

## 2021-12-03 MED ORDER — STIOLTO RESPIMAT 2.5-2.5 MCG/ACT IN AERS: 2.0000 | INHALATION_SPRAY | Freq: Every day | RESPIRATORY_TRACT | 0 refills | Status: DC

## 2021-12-03 NOTE — Telephone Encounter (Signed)
Rx sent to pharmacy patient aware ?

## 2021-12-06 ENCOUNTER — Other Ambulatory Visit: Payer: Self-pay | Admitting: Family Medicine

## 2021-12-06 DIAGNOSIS — R0609 Other forms of dyspnea: Secondary | ICD-10-CM

## 2021-12-06 DIAGNOSIS — R062 Wheezing: Secondary | ICD-10-CM

## 2021-12-07 ENCOUNTER — Encounter: Payer: Self-pay | Admitting: Family Medicine

## 2021-12-07 DIAGNOSIS — C679 Malignant neoplasm of bladder, unspecified: Secondary | ICD-10-CM | POA: Insufficient documentation

## 2021-12-09 ENCOUNTER — Other Ambulatory Visit: Payer: Self-pay

## 2021-12-09 ENCOUNTER — Encounter: Payer: Self-pay | Admitting: Family Medicine

## 2021-12-09 ENCOUNTER — Ambulatory Visit (INDEPENDENT_AMBULATORY_CARE_PROVIDER_SITE_OTHER): Payer: Medicare (Managed Care) | Admitting: Family Medicine

## 2021-12-09 VITALS — HR 63 | Temp 97.9°F | Resp 18 | Ht 68.0 in | Wt 183.6 lb

## 2021-12-09 DIAGNOSIS — R197 Diarrhea, unspecified: Secondary | ICD-10-CM | POA: Diagnosis not present

## 2021-12-09 DIAGNOSIS — R0609 Other forms of dyspnea: Secondary | ICD-10-CM

## 2021-12-09 DIAGNOSIS — Z01818 Encounter for other preprocedural examination: Secondary | ICD-10-CM

## 2021-12-09 DIAGNOSIS — J449 Chronic obstructive pulmonary disease, unspecified: Secondary | ICD-10-CM | POA: Diagnosis not present

## 2021-12-09 DIAGNOSIS — R062 Wheezing: Secondary | ICD-10-CM | POA: Diagnosis not present

## 2021-12-09 DIAGNOSIS — E1165 Type 2 diabetes mellitus with hyperglycemia: Secondary | ICD-10-CM

## 2021-12-09 MED ORDER — STIOLTO RESPIMAT 2.5-2.5 MCG/ACT IN AERS: 2.0000 | INHALATION_SPRAY | Freq: Every day | RESPIRATORY_TRACT | 2 refills | Status: DC

## 2021-12-09 MED ORDER — ALBUTEROL SULFATE HFA 108 (90 BASE) MCG/ACT IN AERS: INHALATION_SPRAY | RESPIRATORY_TRACT | 1 refills | Status: DC

## 2021-12-09 NOTE — Patient Instructions (Addendum)
I will need pulmonology to provide clearance or recommendations for surgery. Call them and let them know that you will need pulmonary clearance for surgery. Stiolto and albuterol sent to your pharmacy again today.  ? ?Decrease metformin to once per day and monitor blood sugars - lab visit in next 2 weeks - once I review labs, I can work on your paperwork for surgery and we can discuss other diabetes meds if needed.  ? ?Call your eye specialist about new provider if needed as he may have someone he wants you to see.  ? ?If you have lab work done today you will be contacted with your lab results within the next 2 weeks.  If you have not heard from [place] then please contact us. The fastest way to get your results is to register for My Chart. ? ? ?IF you received an x-ray today, you will receive an invoice from [facility]. Please contact [facility] at [phone] with questions or concerns regarding your invoice.  ? ?IF you received labwork today, you will receive an invoice from [place]. Please contact [facility] at [phone] with questions or concerns regarding your invoice.  ? ?Our billing staff will not be able to assist you with questions regarding bills from these companies. ? ?You will be contacted with the lab results as soon as they are available. The fastest way to get your results is to activate your My Chart account. Instructions are located on the last page of this paperwork. If you have not heard from [place] regarding the results in 2 weeks, please contact this office. ?  ? ? ?

## 2021-12-09 NOTE — Progress Notes (Addendum)
? ?Subjective:  ?Patient ID: Phillip Barrette., male    DOB: [birth year]  Age: 81 y.o. MRN: [id number] ? ?CC:  ?Chief Complaint  ?Patient presents with  ? Medical Clearance  ?  Patient is here for a surgical clearance.  ? ? ?HPI ?Phillip Barrette. presents for  ? ?Reoperative valuation for dental procedure.  Paperwork received in November, we have discussed a few times since that time but he is not ready to proceed for that procedure.  Now plans to proceed.  Dr. Hoyt Koch is next with facial specialist.  Plan for removal of 11 teeth with alveoloplasty.  Plan for general anesthesia. Plan for procedure now once cleared.  ? ?He does have a history of COPD.  Pulmonologist Dr. Erin Fulling. Appt 5/15. Need refill of albuterol. Only on flovent. Out of stiolto past 2 weeks, albuterol more off that med. Few times per week, not needed when on stiolto. Refilled on 4/21 - has not filled.  ? ?History of diabetes, hypertension, hld, recent diagnosis of bladder CA followed by urology with appt 5/3 - Dr. Alyson Ingles.   A1c slightly elevated at 7.8 in February. Metformin started.  500 mg BID now. Some loose stools at 2 times per day.  ?Home readings 130-170. No 200's. Has lost 10#.  ?No diagnosis of OSA. Rested during the day. Restful sleep.  ?Wt Readings from Last 3 Encounters:  ?12/09/21 183 lb 9.6 oz (83.3 kg)  ?11/24/21 192 lb (87.1 kg)  ?11/16/21 191 lb 12.8 oz (87 kg)  ? ?Home readings were in the 150-180 range last visit April 3.  Plan for repeat A1c in the next few weeks. ?No known history of CAD.  Normal exercise treadmill test in October 2016. ?No known history of CHF, TIA, CVA, or CKD. ?Metabolic equivalents: able use push mower 65mn without CP, min wheeze on hills with COPD. No difficulty with 2 flights of stairs.  ? ?BP Readings from Last 3 Encounters:  ?11/24/21 138/70  ?11/18/21 (!) 144/65  ?11/15/21 130/74  ? ?Lab Results  ?Component Value Date  ? HGBA1C 7.8 (H) 09/29/2021  ? ? ?History ?Patient Active Problem List  ?  Diagnosis Date Noted  ? [place]adder cancer [place] 12/07/2021  ? COPD (chronic obstructive pulmonary disease) [place] 10/13/2021  ? Benign localized prostatic hyperplasia with lower urinary tract symptoms (LUTS) 01/13/2021  ? Acute cystitis with hematuria 01/13/2021  ? Nocturia 01/13/2021  ? 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/13/2021  ? Skin lesion 10/12/2018  ? Colovesical fistula s/p robotic colectomy/repair 09/08/2016 07/12/2016  ? Hypertension 02/28/2013  ? Type 2 diabetes mellitus [place] 02/28/2013  ? Chronic hepatitis C ([place] 02/14/2011  ? TOBACCO ABUSE 12/10/2007  ? MERALGIA PARESTHETICA 12/10/2007  ? ?Past Medical History:  ?Diagnosis Date  ? Asthma   ? "when I was a boy" (04/08/2013)  ? Bronchitis   ? Colon polyps   ? Colovesical fistula   ? COPD (chronic obstructive pulmonary disease) ([place]   ? Diverticulosis   ? GERD (gastroesophageal reflux disease)   ? Hepatitis C   ? treated with injections  ? High cholesterol   ? Hypertension   ? [place] II [place]   ? hx of . No longer on medicine  ? ?Past Surgical History:  ?Procedure Laterality Date  ? COLONOSCOPY  last 09/06/2016  ? CYSTOSCOPY N/A 09/08/2016  ? Procedure: FIREFLY INJECTIONS;  Surgeon: PCleon Gustin MD;  [facility];  Service: Urology;  Laterality: N/A;  ? CYSTOSCOPY W/  RETROGRADES Bilateral 11/18/2021  ? Procedure: CYSTOSCOPY WITH RETROGRADE PYELOGRAM;  Surgeon: Cleon Gustin, MD;  Location[facility];  Service: Urology;  Laterality: Bilateral;  ? INCISION AND DRAINAGE OF WOUND Right 1970's  ? "leg" (04/08/2013)  ? LIVER BIOPSY  ~ 2011  ? POLYPECTOMY    ? PROCTOSCOPY N/A 09/08/2016  ? Procedure: RIGID PROCTOSCOPY;  Surgeon: Michael Boston, MD;  [facility];  Service: General;  Laterality: N/A;  ? TONSILLECTOMY    ? 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  ? Procedure: TRANSURETHRAL RESECTION OF BLADDER TUMOR (TURBT);  Surgeon: Cleon Gustin, MD;  Location: [facility];  Service: Urology;  Laterality:  N/A;  ? ?Allergies  ?Allergen Reactions  ? Penicillins Anaphylaxis and Other (See Comments)  ?  Has patient had a PCN reaction causing immediate rash, facial/tongue/throat swelling, SOB or lightheadedness with hypotension: Yes ?Has patient had a PCN reaction causing severe rash involving mucus membranes or skin necrosis: No ?Has patient had a PCN reaction that required hospitalization No ?Has patient had a PCN reaction occurring within the last 10 years: No ?If all of the above answers are "NO", then may proceed with Cephalosporin use.  ? Shellfish Allergy Anaphylaxis  ? ?Prior to Admission medications   ?Medication Sig Start Date End Date Taking? Authorizing Provider  ?albuterol (VENTOLIN HFA) 108 (90 Base) MCG/ACT inhaler TAKE 2 PUFFS BY MOUTH EVERY 6 HOURS AS NEEDED FOR WHEEZE OR SHORTNESS OF BREATH 12/07/21  Yes Wendie Agreste, MD  ?atorvastatin (LIPITOR) 10 MG tablet TAKE 1 TABLET EVERY DAY 07/26/21  Yes Wendie Agreste, MD  ?blood glucose meter kit and supplies Dispense based on patient and insurance preference. Use up to four times daily as directed. (FOR ICD-10 E10.9, E11.9). 09/29/21  Yes Wendie Agreste, MD  ?finasteride (PROSCAR) 5 MG tablet Take 1 tablet (5 mg total) by mouth daily. 01/13/21  Yes McKenzie, Candee Furbish, MD  ?fluticasone (FLOVENT HFA) 110 MCG/ACT inhaler Inhale 1-2 puffs into the lungs in the morning and at bedtime. 05/06/21  Yes Wendie Agreste, MD  ?losartan-hydrochlorothiazide (HYZAAR) 50-12.5 MG tablet Take 1 tablet by mouth daily. 09/29/21  Yes Wendie Agreste, MD  ?metFORMIN (GLUCOPHAGE) 500 MG tablet Take 1 tablet (500 mg total) by mouth 2 (two) times daily with a meal. 10/13/21  Yes Wendie Agreste, MD  ?oxyCODONE-acetaminophen (PERCOCET) 5-325 MG tablet Take 1 tablet by mouth every 4 (four) hours as needed for severe pain. 11/18/21 11/18/22 Yes McKenzie, Candee Furbish, MD  ?sildenafil (REVATIO) 20 MG tablet Take 1 tablet (20 mg total) by mouth as needed. 01/13/21  Yes McKenzie, Candee Furbish, MD  ?sildenafil (VIAGRA) 100 MG tablet Take 1 tablet (100 mg total) by mouth daily as needed for erectile dysfunction. 11/03/21  Yes McKenzie, Candee Furbish, MD  ?silodosin (RAPAFLO) 8 MG CAPS capsule Take 1 capsule (8 mg total) by mouth at bedtime. 11/03/21  Yes McKenzie, Candee Furbish, MD  ?tamsulosin (FLOMAX) 0.4 MG CAPS capsule Take 2 capsules (0.8 mg total) by mouth at bedtime. 01/13/21  Yes McKenzie, Candee Furbish, MD  ?Tiotropium Bromide-Olodaterol (STIOLTO RESPIMAT) 2.5-2.5 MCG/ACT AERS Inhale 2 puffs into the lungs daily. 12/03/21  Yes Wendie Agreste, MD  ? ?Social History  ? ?Socioeconomic History  ? Marital status: Married  ?  Spouse name: Not on file  ? Number of children: 2  ? Years of education: Not on file  ? Highest education level: Not on file  ?Occupational History  ? Occupation:  truck driver  ?  Employer: [employer]  ?  Comment: retired  ?Tobacco Use  ? Smoking status: Former  ?  Packs/day: 0.25  ?  Years: 45.00  ?  Pack years: 11.25  ?  Types: Cigarettes  ?  Quit date: 03/05/2021  ?  Years since quitting: 0.7  ? Smokeless tobacco: Never  ?Vaping Use  ? Vaping Use: Never used  ?Substance and Sexual Activity  ? Alcohol use: Yes  ?  Alcohol/week: 12.0 standard drinks  ?  Types: 12 Cans of beer per week  ?  Comment: 12 pack a week.  ? Drug use: No  ? Sexual activity: Yes  ?Other Topics Concern  ? Not on file  ?Social History Narrative  ? Pt lives in 1 story home with his wife  ? Has 2 adult children  ? Highest level of education: GED & Trade school  ? Retired Media planner.   ? ?Social Determinants of Health  ? ?Financial Resource Strain: Low Risk   ? Difficulty of Paying Living Expenses: Not hard at all  ?Food Insecurity: No Food Insecurity  ? Worried About Charity fundraiser in the Last Year: Never true  ? Ran Out of Food in the Last Year: Never true  ?Transportation Needs: No Transportation Needs  ? Lack of Transportation (Medical): No  ? Lack of Transportation (Non-Medical): No  ?Physical  Activity: Inactive  ? Days of Exercise per Week: 0 days  ? Minutes of Exercise per Session: 0 min  ?Stress: No Stress Concern Present  ? Feeling of Stress : Not at all  ?Social Connections: Moderately Integrate

## 2021-12-13 ENCOUNTER — Telehealth: Payer: Self-pay | Admitting: Urology

## 2021-12-13 NOTE — Telephone Encounter (Signed)
Patient called the office today.  Patient's insurance wants to know if there is a generic for sildosin that we can prescribe.  ? ?Please advise. ?

## 2021-12-14 NOTE — Telephone Encounter (Signed)
Returned call and advised silodosin is generic form and to call pharmacy and verify they are filling as generic. Voiced understanding.  ?

## 2021-12-16 ENCOUNTER — Other Ambulatory Visit (INDEPENDENT_AMBULATORY_CARE_PROVIDER_SITE_OTHER): Payer: Medicare (Managed Care)

## 2021-12-16 DIAGNOSIS — Z9889 Other specified postprocedural states: Secondary | ICD-10-CM

## 2021-12-16 DIAGNOSIS — E1165 Type 2 diabetes mellitus with hyperglycemia: Secondary | ICD-10-CM

## 2021-12-16 DIAGNOSIS — R779 Abnormality of plasma protein, unspecified: Secondary | ICD-10-CM | POA: Diagnosis not present

## 2021-12-16 DIAGNOSIS — Z01818 Encounter for other preprocedural examination: Secondary | ICD-10-CM | POA: Diagnosis not present

## 2021-12-16 LAB — COMPREHENSIVE METABOLIC PANEL
ALT: 12 U/L (ref 0–53)
AST: 14 U/L (ref 0–37)
Albumin: 4.1 g/dL (ref 3.5–5.2)
Alkaline Phosphatase: 47 U/L (ref 39–117)
BUN: 15 mg/dL (ref 6–23)
CO2: 27 mEq/L (ref 19–32)
Calcium: 9.3 mg/dL (ref 8.4–10.5)
Chloride: 100 mEq/L (ref 96–112)
Creatinine, Ser: 1.05 mg/dL (ref 0.40–1.50)
GFR: 67.82 mL/min (ref >60.00)
Glucose, Bld: 170 mg/dL — ABNORMAL HIGH (ref 70–99)
Potassium: 3.7 mEq/L (ref 3.5–5.1)
Sodium: 137 mEq/L (ref 135–145)
Total Bilirubin: 0.9 mg/dL (ref 0.2–1.2)
Total Protein: 7.1 g/dL (ref 6.0–8.3)

## 2021-12-16 LAB — CBC
HCT: 41.3 % (ref 39.0–52.0)
Hemoglobin: 13.7 g/dL (ref 13.0–17.0)
MCHC: 33.1 g/dL (ref 30.0–36.0)
MCV: 92.3 fl (ref 78.0–100.0)
Platelets: 304 10*3/uL (ref 150.0–400.0)
RBC: 4.47 Mil/uL (ref 4.22–5.81)
RDW: 13.6 % (ref 11.5–15.5)
WBC: 7.2 10*3/uL (ref 4.0–10.5)

## 2021-12-16 LAB — HEMOGLOBIN A1C: Hgb A1c MFr Bld: 7.5 % — ABNORMAL HIGH (ref 4.6–6.5)

## 2021-12-17 LAB — URINALYSIS, ROUTINE W REFLEX MICROSCOPIC
Bilirubin, UA: NEGATIVE
Glucose, UA: NEGATIVE
Ketones, UA: NEGATIVE
Nitrite, UA: NEGATIVE
Specific Gravity, UA: 1.02 (ref 1.005–1.030)
Urobilinogen, Ur: 0.2 mg/dL (ref 0.2–1.0)
pH, UA: 5 (ref 5.0–7.5)

## 2021-12-17 LAB — MICROSCOPIC EXAMINATION
Bacteria, UA: NONE SEEN
Casts: NONE SEEN /lpf
WBC, UA: >30 /hpf — AB (ref 0–5)

## 2021-12-27 ENCOUNTER — Ambulatory Visit (INDEPENDENT_AMBULATORY_CARE_PROVIDER_SITE_OTHER): Payer: Medicare (Managed Care) | Admitting: Pulmonary Disease

## 2021-12-27 ENCOUNTER — Encounter: Payer: Self-pay | Admitting: Pulmonary Disease

## 2021-12-27 VITALS — BP 128/80 | HR 84 | Ht 68.0 in | Wt 185.2 lb

## 2021-12-27 DIAGNOSIS — J4540 Moderate persistent asthma, uncomplicated: Secondary | ICD-10-CM

## 2021-12-27 DIAGNOSIS — J449 Chronic obstructive pulmonary disease, unspecified: Secondary | ICD-10-CM

## 2021-12-27 DIAGNOSIS — D721 Eosinophilia, unspecified: Secondary | ICD-10-CM

## 2021-12-27 DIAGNOSIS — J4530 Mild persistent asthma, uncomplicated: Secondary | ICD-10-CM

## 2021-12-27 LAB — PULMONARY FUNCTION TEST
DL/VA % pred: 96 %
DL/VA: 3.83 ml/min/mmHg/L
DLCO cor % pred: 82 %
DLCO cor: 19.09 ml/min/mmHg
DLCO unc % pred: 80 %
DLCO unc: 18.59 ml/min/mmHg
FEF 25-75 Post: 2.51 L/sec
FEF 25-75 Pre: 1.53 L/sec
FEF2575-%Change-Post: 64 %
FEF2575-%Pred-Post: 127 %
FEF2575-%Pred-Pre: 77 %
FEV1-%Change-Post: 10 %
FEV1-%Pred-Post: 84 %
FEV1-%Pred-Pre: 76 %
FEV1-Post: 2.09 L
FEV1-Pre: 1.9 L
FEV1FVC-%Change-Post: 0 %
FEV1FVC-%Pred-Pre: 102 %
FEV6-%Change-Post: 10 %
FEV6-%Pred-Post: 85 %
FEV6-%Pred-Pre: 77 %
FEV6-Post: 2.73 L
FEV6-Pre: 2.47 L
FEV6FVC-%Change-Post: 0 %
FEV6FVC-%Pred-Post: 105 %
FEV6FVC-%Pred-Pre: 106 %
FVC-%Change-Post: 10 %
FVC-%Pred-Post: 80 %
FVC-%Pred-Pre: 72 %
FVC-Post: 2.74 L
FVC-Pre: 2.47 L
Post FEV1/FVC ratio: 76 %
Post FEV6/FVC ratio: 100 %
Pre FEV1/FVC ratio: 77 %
Pre FEV6/FVC Ratio: 100 %
RV % pred: 122 %
RV: 3.06 L
TLC % pred: 86 %
TLC: 5.73 L

## 2021-12-27 LAB — CBC WITH DIFFERENTIAL/PLATELET
Basophils Absolute: 0.1 10*3/uL (ref 0.0–0.1)
Basophils Relative: 1.1 % (ref 0.0–3.0)
Eosinophils Absolute: 1.3 10*3/uL — ABNORMAL HIGH (ref 0.0–0.7)
Eosinophils Relative: 16.2 % — ABNORMAL HIGH (ref 0.0–5.0)
HCT: 41.1 % (ref 39.0–52.0)
Hemoglobin: 13.5 g/dL (ref 13.0–17.0)
Lymphocytes Relative: 32.2 % (ref 12.0–46.0)
Lymphs Abs: 2.6 10*3/uL (ref 0.7–4.0)
MCHC: 32.9 g/dL (ref 30.0–36.0)
MCV: 92.4 fl (ref 78.0–100.0)
Monocytes Absolute: 0.6 10*3/uL (ref 0.1–1.0)
Monocytes Relative: 8 % (ref 3.0–12.0)
Neutro Abs: 3.4 10*3/uL (ref 1.4–7.7)
Neutrophils Relative %: 42.5 % — ABNORMAL LOW (ref 43.0–77.0)
Platelets: 286 10*3/uL (ref 150.0–400.0)
RBC: 4.45 Mil/uL (ref 4.22–5.81)
RDW: 13.9 % (ref 11.5–15.5)
WBC: 8.1 10*3/uL (ref 4.0–10.5)

## 2021-12-27 NOTE — Progress Notes (Addendum)
Synopsis: Referred in March 2023 for COPD by Phillip Ray, MD  Subjective:   PATIENT ID: Phillip Maynard. GENDER: male DOB: 05-06-1941, MRN: [id number]  HPI  Chief Complaint  Patient presents with   Follow-up    F/U after PFT. Still using Flovent daily.    Phillip Maynard is a 81 year old male, former smoker with GERD, hypertension and DMII who returns to pulmonary clinic for asthma.  He is using flovent 11mg 2 puffs twice daily and did notice improvement in his breathing with the addition of stiolto inhaler after last visit. He is using albuterol a couple of times per week.   PFTs are within normal limits today.   OV 10/26/21 Patient is having exertional dyspnea when walking to his mail box that has a slight incline in the driveway. Over recent weeks he is needing to stop to rest due to the shortness of breath. He does experience intermittent wheezing, mainly dry cough but will have some clear mucous production every once in a while. He denies night time awakenings due to shortness of breath, cough or wheezing. He has gained about 12lbs over the last year.   He has been using flovent 1126m 2 puffs twice daily and spiriva have helped improve his breathing. He reports taking prednisone 3 weeks ago which helped his breathing and cough.   He quit smoking last year. He was smoking 0.5 to 1 pack per day for 30 years. 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 He lives with his wife. His mother had asthma and his brother and sisters have asthma.   He is having dental surgery in the near future. No issues with anesthesia in the past.   Past Medical History:  Diagnosis Date   Asthma    "when I was a boy" (04/08/2013)   Bronchitis    Colon polyps    Colovesical fistula    COPD (chronic obstructive pulmonary disease) (HCC)    Diverticulosis    GERD (gastroesophageal reflux disease)    Hepatitis  C    treated with injections   High cholesterol    Hypertension    Type II diabetes mellitus (HCC)    hx of . No longer on medicine     Family History  Problem Relation Age of Onset   Asthma Mother    Heart attack Mother    Colon polyps Neg Hx    Colon cancer Neg Hx    Esophageal cancer Neg Hx    Rectal cancer Neg Hx    Stomach cancer Neg Hx      Social History   Socioeconomic History   Marital status: Married    Spouse name: Not on file   Number of children: 2   Years of education: Not on file   Highest education level: Not on file  Occupational History   Occupation: truck drEducation administratorBest Dedicated    Comment: retired  Tobacco Use   Smoking status: Former    Packs/day: 0.25    Years: 45.00    Pack years: 11.25    Types: Cigarettes    Quit date: 03/05/2021    Years since quitting: 0.8   Smokeless tobacco: Never  Vaping Use   Vaping Use: Never used  Substance and Sexual Activity   Alcohol use: Yes    Alcohol/week: 12.0 standard drinks    Types:  12 Cans of beer per week    Comment: 12 pack a week.   Drug use: No   Sexual activity: Yes  Other Topics Concern   Not on file  Social History Narrative   Pt lives in 1 story home with his wife   Has 2 adult children   Highest level of education: GED & Trade school   Retired Media planner.    Social Determinants of Health   Financial Resource Strain: Low Risk    Difficulty of Paying Living Expenses: Not hard at all  Food Insecurity: No Food Insecurity   Worried About Charity fundraiser in the Last Year: Never true   [place] in the Last Year: Never true  Transportation Needs: No Transportation Needs   Lack of Transportation (Medical): No   Lack of Transportation (Non-Medical): No  Physical Activity: Inactive   Days of Exercise per Week: 0 days   Minutes of Exercise per Session: 0 min  Stress: No Stress Concern Present   Feeling of Stress : Not at all  Social Connections: Moderately  Integrated   Frequency of Communication with Friends and Family: Twice a week   Frequency of Social Gatherings with Friends and Family: Twice a week   Attends Religious Services: 1 to 4 times per year   Active Member of [employer] or Organizations: No   Attends Music therapist: Never   Marital Status: Married  Human resources officer Violence: Not At Risk   Fear of Current or Ex-Partner: No   Emotionally Abused: No   Physically Abused: No   Sexually Abused: No     Allergies  Allergen Reactions   Penicillins Anaphylaxis and Other (See Comments)    Has patient had a PCN reaction causing immediate rash, facial/tongue/throat swelling, SOB or lightheadedness with hypotension: Yes Has patient had a PCN reaction causing severe rash involving mucus membranes or skin necrosis: No Has patient had a PCN reaction that required hospitalization No Has patient had a PCN reaction occurring within the last 10 years: No If all of the above answers are "NO", then may proceed with Cephalosporin use.   Shellfish Allergy Anaphylaxis     Outpatient Medications Prior to Visit  Medication Sig Dispense Refill   albuterol (VENTOLIN HFA) 108 (90 Base) MCG/ACT inhaler TAKE 2 PUFFS BY MOUTH EVERY 6 HOURS AS NEEDED FOR WHEEZE OR SHORTNESS OF BREATH 18 each 1   atorvastatin (LIPITOR) 10 MG tablet TAKE 1 TABLET EVERY DAY 90 tablet 1   blood glucose meter kit and supplies Dispense based on patient and insurance preference. Use up to four times daily as directed. (FOR ICD-10 E10.9, E11.9). 1 each 0   finasteride (PROSCAR) 5 MG tablet Take 1 tablet (5 mg total) by mouth daily. 90 tablet 3   fluticasone (FLOVENT HFA) 110 MCG/ACT inhaler Inhale 1-2 puffs into the lungs in the morning and at bedtime. 1 each 5   losartan-hydrochlorothiazide (HYZAAR) 50-12.5 MG tablet Take 1 tablet by mouth daily. 90 tablet 1   metFORMIN (GLUCOPHAGE) 500 MG tablet Take 1 tablet (500 mg total) by mouth 2 (two) times daily with a meal. 90  tablet 1   oxyCODONE-acetaminophen (PERCOCET) 5-325 MG tablet Take 1 tablet by mouth every 4 (four) hours as needed for severe pain. 30 tablet 0   sildenafil (REVATIO) 20 MG tablet Take 1 tablet (20 mg total) by mouth as needed. 30 tablet 6   sildenafil (VIAGRA) 100 MG tablet Take 1 tablet (  100 mg total) by mouth daily as needed for erectile dysfunction. 6 tablet 0   silodosin (RAPAFLO) 8 MG CAPS capsule Take 1 capsule (8 mg total) by mouth at bedtime. 30 capsule 11   tamsulosin (FLOMAX) 0.4 MG CAPS capsule Take 2 capsules (0.8 mg total) by mouth at bedtime. 60 capsule 11   Tiotropium Bromide-Olodaterol (STIOLTO RESPIMAT) 2.5-2.5 MCG/ACT AERS Inhale 2 puffs into the lungs daily. 8 g 2   No facility-administered medications prior to visit.   Review of Systems  Constitutional:  Negative for chills, fever, malaise/fatigue and weight loss.  HENT:  Negative for congestion, sinus pain and sore throat.   Eyes: Negative.   Respiratory:  Positive for cough, shortness of breath and wheezing. Negative for hemoptysis and sputum production.   Cardiovascular:  Negative for chest pain, palpitations, orthopnea, claudication and leg swelling.  Gastrointestinal:  Negative for abdominal pain, heartburn, nausea and vomiting.  Genitourinary: Negative.   Musculoskeletal:  Negative for joint pain and myalgias.  Skin:  Negative for rash.  Neurological:  Negative for weakness.  Endo/Heme/Allergies: Negative.   Psychiatric/Behavioral: Negative.     Objective:   Vitals:   12/27/21 1106  BP: 128/80  Pulse: 84  SpO2: 98%  Weight: 185 lb 3.2 oz (84 kg)  Height: '5\' 8"'  (1.727 m)    Physical Exam Constitutional:      General: He is not in acute distress. HENT:     Head: Normocephalic and atraumatic.  Eyes:     Conjunctiva/sclera: Conjunctivae normal.  Cardiovascular:     Rate and Rhythm: Normal rate and regular rhythm.     Pulses: Normal pulses.     Heart sounds: Normal heart sounds. No murmur  heard. Pulmonary:     Effort: Pulmonary effort is normal.     Breath sounds: Decreased breath sounds present. No wheezing, rhonchi or rales.  Musculoskeletal:     Right lower leg: No edema.     Left lower leg: No edema.  Skin:    General: Skin is warm and dry.  Neurological:     General: No focal deficit present.     Mental Status: He is alert.  Psychiatric:        Mood and Affect: Mood normal.        Behavior: Behavior normal.        Thought Content: Thought content normal.        Judgment: Judgment normal.   CBC    Component Value Date/Time   WBC 8.1 12/27/2021 1230   RBC 4.45 12/27/2021 1230   HGB 13.5 12/27/2021 1230   HCT 41.1 12/27/2021 1230   PLT 286.0 12/27/2021 1230   MCV 92.4 12/27/2021 1230   MCV 93.7 12/02/2013 1003   MCH 30.4 09/17/2021 0155   MCHC 32.9 12/27/2021 1230   RDW 13.9 12/27/2021 1230   LYMPHSABS 2.6 12/27/2021 1230   MONOABS 0.6 12/27/2021 1230   EOSABS 1.3 (H) 12/27/2021 1230   BASOSABS 0.1 12/27/2021 1230      Latest Ref Rng & Units 12/16/2021    9:00 AM 09/29/2021    9:48 AM 09/17/2021    1:55 AM  BMP  Glucose 70 - 99 mg/dL 170   153   151    BUN 6 - 23 mg/dL '15   14   15    ' Creatinine 0.40 - 1.50 mg/dL 1.05   1.13   0.97    Sodium 135 - 145 mEq/L 137   135   134  Potassium 3.5 - 5.1 mEq/L 3.7   3.5   3.7    Chloride 96 - 112 mEq/L 100   97   101    CO2 19 - 32 mEq/L '27   29   26    ' Calcium 8.4 - 10.5 mg/dL 9.3   9.5   9.4     Chest imaging: CXR 09/17/21 1. Cardiomegaly. 2. Low lung volumes with mild atelectasis at the lung bases.  PFT:    Latest Ref Rng & Units 12/27/2021    9:39 AM  PFT Results  FVC-Pre L 2.47    FVC-Predicted Pre % 72    FVC-Post L 2.74    FVC-Predicted Post % 80    Pre FEV1/FVC % % 77    Post FEV1/FCV % % 76    FEV1-Pre L 1.90    FEV1-Predicted Pre % 76    FEV1-Post L 2.09    DLCO uncorrected ml/min/mmHg 18.59    DLCO UNC% % 80    DLCO corrected ml/min/mmHg 19.09    DLCO COR %Predicted % 82    DLVA  Predicted % 96    TLC L 5.73    TLC % Predicted % 86    RV % Predicted % 122      Labs:  Path:  Echo:  Heart Catheterization:  Assessment & Plan:   Mild persistent asthma without complication  Eosinophilia, unspecified type - Plan: CBC with Differential, IgE, IgE, CBC with Differential  Discussion: Romel Dumond is a 81 year old male, former smoker with GERD, hypertension and DMII who returns to pulmonary clinic for asthma.  He has significant smoking history along with occupational dust exposures that place him at risk for obstructive lung disease. His pulmonary function tests are within normal limits today. He appears to have moderate persistent asthma based on clinical history.  He is to start trelegy ellipta inhaler for ICS/LAMA/LABA therapy based on pharmacy review of insurance coverage.  His absolute eosinophil count was 1,800 and 1,300 on repeat labs today. His IgE level was 266 and 305 on repeat testing. 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 No evidence of pulmonary infiltrates on recent chest x-Maynard.   Follow up in 3 months.  Freda Jackson, MD [facility] Office: [phone]   Current Outpatient Medications:    albuterol (VENTOLIN HFA) 108 (90 Base) MCG/ACT inhaler, TAKE 2 PUFFS BY MOUTH EVERY 6 HOURS AS NEEDED FOR WHEEZE OR SHORTNESS OF BREATH, Disp: 18 each, Rfl: 1   atorvastatin (LIPITOR) 10 MG tablet, TAKE 1 TABLET EVERY DAY, Disp: 90 tablet, Rfl: 1   blood glucose meter kit and supplies, Dispense based on patient and insurance preference. Use up to four times daily as directed. (FOR ICD-10 E10.9, E11.9)., Disp: 1 each, Rfl: 0   finasteride (PROSCAR) 5 MG tablet, Take 1 tablet (5 mg total) by mouth daily., Disp: 90 tablet, Rfl: 3   fluticasone (FLOVENT HFA) 110 MCG/ACT inhaler, Inhale 1-2 puffs into the lungs in the morning and at bedtime., Disp: 1  each, Rfl: 5   losartan-hydrochlorothiazide (HYZAAR) 50-12.5 MG tablet, Take 1 tablet by mouth daily., Disp: 90 tablet, Rfl: 1   metFORMIN (GLUCOPHAGE) 500 MG tablet, Take 1 tablet (500 mg total) by mouth 2 (two) times daily with a meal., Disp: 90 tablet, Rfl: 1   oxyCODONE-acetaminophen (PERCOCET) 5-325 MG tablet, Take 1 tablet by mouth every 4 (four) hours as needed for  severe pain., Disp: 30 tablet, Rfl: 0   sildenafil (REVATIO) 20 MG tablet, Take 1 tablet (20 mg total) by mouth as needed., Disp: 30 tablet, Rfl: 6   sildenafil (VIAGRA) 100 MG tablet, Take 1 tablet (100 mg total) by mouth daily as needed for erectile dysfunction., Disp: 6 tablet, Rfl: 0   silodosin (RAPAFLO) 8 MG CAPS capsule, Take 1 capsule (8 mg total) by mouth at bedtime., Disp: 30 capsule, Rfl: 11   tamsulosin (FLOMAX) 0.4 MG CAPS capsule, Take 2 capsules (0.8 mg total) by mouth at bedtime., Disp: 60 capsule, Rfl: 11   Tiotropium Bromide-Olodaterol (STIOLTO RESPIMAT) 2.5-2.5 MCG/ACT AERS, Inhale 2 puffs into the lungs daily., Disp: 8 g, Rfl: 2

## 2021-12-27 NOTE — Progress Notes (Signed)
PFT done today. 

## 2021-12-27 NOTE — Patient Instructions (Addendum)
We will check with our pharmacy team about which inhaler is covered by your insurance.  ? ?We will check labs again today to monitor the eosinophil level. If it remains elevated we will refer you to a hematologist (blood doctor) ? ?Continue on flovent twice daily and as needed albuterol.  ? ?Follow up in 3 months ?

## 2021-12-29 LAB — IGE: IgE (Immunoglobulin E), Serum: 305 kU/L — ABNORMAL HIGH (ref <=114)

## 2022-01-03 ENCOUNTER — Telehealth: Payer: Self-pay | Admitting: Pharmacy Technician

## 2022-01-03 ENCOUNTER — Other Ambulatory Visit (HOSPITAL_COMMUNITY): Payer: Self-pay

## 2022-01-03 NOTE — Telephone Encounter (Signed)
-----   Message from Freddi Starr, MD sent at 12/27/2021 12:00 PM EDT ----- Regarding: inhaler costs Please check on inhaler options for LAMA/LABA such as stiolto, anoro or bevespi. Or if he qualifies for trelegy or breztri.  Thank you, Wille Glaser

## 2022-01-04 ENCOUNTER — Encounter: Payer: Self-pay | Admitting: Pulmonary Disease

## 2022-01-04 MED ORDER — ANORO ELLIPTA 62.5-25 MCG/ACT IN AEPB: 1.0000 | INHALATION_SPRAY | Freq: Every day | RESPIRATORY_TRACT | 3 refills | Status: DC

## 2022-01-04 MED ORDER — TRELEGY ELLIPTA 100-62.5-25 MCG/ACT IN AEPB: 1.0000 | INHALATION_SPRAY | Freq: Every day | RESPIRATORY_TRACT | 1 refills | Status: DC

## 2022-01-04 NOTE — Telephone Encounter (Signed)
Called and spoke with patient. He verbalized understanding of the Trelegy. He wishes to have this sent to [facility]. He is aware to not use the Trelegy and Flovent together.   I called [facility] to cancel the Anoro.   Nothing further needed at time of call.

## 2022-01-05 ENCOUNTER — Telehealth: Payer: Self-pay | Admitting: Physician Assistant

## 2022-01-05 NOTE — Telephone Encounter (Signed)
Scheduled appt per 5/23 referral. Pt is aware of appt date and time. Pt is aware to arrive 15 mins prior to appt time and to bring and updated insurance card. Pt is aware of appt location.   

## 2022-01-13 DIAGNOSIS — H02203 Unspecified lagophthalmos right eye, unspecified eyelid: Secondary | ICD-10-CM | POA: Diagnosis not present

## 2022-01-13 DIAGNOSIS — H25813 Combined forms of age-related cataract, bilateral: Secondary | ICD-10-CM | POA: Diagnosis not present

## 2022-01-13 DIAGNOSIS — H02206 Unspecified lagophthalmos left eye, unspecified eyelid: Secondary | ICD-10-CM | POA: Diagnosis not present

## 2022-01-13 DIAGNOSIS — H04123 Dry eye syndrome of bilateral lacrimal glands: Secondary | ICD-10-CM | POA: Diagnosis not present

## 2022-01-13 LAB — HM DIABETES EYE EXAM

## 2022-01-14 ENCOUNTER — Ambulatory Visit (INDEPENDENT_AMBULATORY_CARE_PROVIDER_SITE_OTHER): Payer: Medicare (Managed Care) | Admitting: Urology

## 2022-01-14 VITALS — BP 119/60 | HR 54

## 2022-01-14 DIAGNOSIS — N401 Enlarged prostate with lower urinary tract symptoms: Secondary | ICD-10-CM | POA: Diagnosis not present

## 2022-01-14 DIAGNOSIS — R339 Retention of urine, unspecified: Secondary | ICD-10-CM | POA: Diagnosis not present

## 2022-01-14 DIAGNOSIS — R310 Gross hematuria: Secondary | ICD-10-CM | POA: Diagnosis not present

## 2022-01-14 LAB — MICROSCOPIC EXAMINATION
Bacteria, UA: NONE SEEN
RBC, Urine: >30 /hpf — AB (ref 0–2)
Renal Epithel, UA: NONE SEEN /hpf

## 2022-01-14 LAB — URINALYSIS, ROUTINE W REFLEX MICROSCOPIC
Bilirubin, UA: NEGATIVE
Glucose, UA: NEGATIVE
Ketones, UA: NEGATIVE
Nitrite, UA: NEGATIVE
Protein,UA: NEGATIVE
Specific Gravity, UA: 1.02 (ref 1.005–1.030)
Urobilinogen, Ur: 0.2 mg/dL (ref 0.2–1.0)
pH, UA: 5.5 (ref 5.0–7.5)

## 2022-01-14 MED ORDER — SILODOSIN 8 MG PO CAPS: 8.0000 mg | ORAL_CAPSULE | Freq: Every day | ORAL | 11 refills | Status: DC

## 2022-01-14 NOTE — Progress Notes (Signed)
01/14/2022 1:01 PM   Phillip Maynard. 21-Oct-1940 [id number]  Referring provider: Wendie Agreste, MD [address] [place],  [place] [address]  Followupo bladder tumor excision   HPI: Phillip Maynard is a 81yo here for followup for BPH and after bladder tumor excision IPSS 7 QOl 1 on rapaflo 85m. Urine stream strong. No straining to urinate. Nocturia 0-2x depending on fluid consumption. Pathology from bladder tumor was T1G3.  No other complaints today  PMH: Past Medical History:  Diagnosis Date   Asthma    "when I was a boy" (04/08/2013)   Bronchitis    Colon polyps    Colovesical fistula    COPD (chronic obstructive pulmonary disease) (HCC)    Diverticulosis    GERD (gastroesophageal reflux disease)    Hepatitis C    treated with injections   High cholesterol    Hypertension    Type II diabetes mellitus (HCC)    hx of . No longer on medicine    Surgical History: Past Surgical History:  Procedure Laterality Date   COLONOSCOPY  last 09/06/2016   CYSTOSCOPY N/A 09/08/2016   Procedure: FIREFLY INJECTIONS;  Surgeon: PCleon Gustin MD;  Location: [facility];  Service: Urology;  Laterality: N/A;   CYSTOSCOPY W/ RETROGRADES Bilateral 11/18/2021   Procedure: CYSTOSCOPY WITH RETROGRADE PYELOGRAM;  Surgeon: MCleon Gustin MD;  Location: [facility];  Service: Urology;  Laterality: Bilateral;   INCISION AND DRAINAGE OF WOUND Right 1970's   "leg" (04/08/2013)   LIVER BIOPSY  ~ 2011   POLYPECTOMY     PROCTOSCOPY N/A 09/08/2016   Procedure: RIGID PROCTOSCOPY;  Surgeon: SMichael Boston MD;  Location: [facility];  Service: General;  Laterality: N/A;   TONSILLECTOMY     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   Procedure: TRANSURETHRAL RESECTION OF BLADDER TUMOR (TURBT);  Surgeon: MCleon Gustin MD;  Location: [facility];  Service: Urology;  Laterality: N/A;    Home Medications:  Allergies as of 01/14/2022       Reactions   Penicillins Anaphylaxis, Other (See  Comments)   Has patient had a PCN reaction causing immediate rash, facial/tongue/throat swelling, SOB or lightheadedness with hypotension: Yes Has patient had a PCN reaction causing severe rash involving mucus membranes or skin necrosis: No Has patient had a PCN reaction that required hospitalization No Has patient had a PCN reaction occurring within the last 10 years: No If all of the above answers are "NO", then may proceed with Cephalosporin use.   Shellfish Allergy Anaphylaxis        Medication List        Accurate as of January 14, 2022  1:01 PM. If you have any questions, ask your nurse or doctor.          albuterol 108 (90 Base) MCG/ACT inhaler Commonly known as: VENTOLIN HFA TAKE 2 PUFFS BY MOUTH EVERY 6 HOURS AS NEEDED FOR WHEEZE OR SHORTNESS OF BREATH   atorvastatin 10 MG tablet Commonly known as: LIPITOR TAKE 1 TABLET EVERY DAY   blood glucose meter kit and supplies Dispense based on patient and insurance preference. Use up to four times daily as directed. (FOR ICD-10 E10.9, E11.9).   finasteride 5 MG tablet Commonly known as: PROSCAR Take 1 tablet (5 mg total) by mouth daily.   losartan-hydrochlorothiazide 50-12.5 MG tablet Commonly known as: HYZAAR Take 1 tablet by mouth daily.   metFORMIN 500 MG tablet Commonly known as: GLUCOPHAGE Take 1 tablet (500  mg total) by mouth 2 (two) times daily with a meal.   oxyCODONE-acetaminophen 5-325 MG tablet Commonly known as: Percocet Take 1 tablet by mouth every 4 (four) hours as needed for severe pain.   sildenafil 100 MG tablet Commonly known as: VIAGRA Take 1 tablet (100 mg total) by mouth daily as needed for erectile dysfunction.   sildenafil 20 MG tablet Commonly known as: REVATIO Take 1 tablet (20 mg total) by mouth as needed.   silodosin 8 MG Caps capsule Commonly known as: RAPAFLO Take 1 capsule (8 mg total) by mouth at bedtime.   tamsulosin 0.4 MG Caps capsule Commonly known as: FLOMAX Take 2  capsules (0.8 mg total) by mouth at bedtime.   Trelegy Ellipta 100-62.5-25 MCG/ACT Aepb Generic drug: Fluticasone-Umeclidin-Vilant Inhale 1 puff into the lungs daily.        Allergies:  Allergies  Allergen Reactions   Penicillins Anaphylaxis and Other (See Comments)    Has patient had a PCN reaction causing immediate rash, facial/tongue/throat swelling, SOB or lightheadedness with hypotension: Yes Has patient had a PCN reaction causing severe rash involving mucus membranes or skin necrosis: No Has patient had a PCN reaction that required hospitalization No Has patient had a PCN reaction occurring within the last 10 years: No If all of the above answers are "NO", then may proceed with Cephalosporin use.   Shellfish Allergy Anaphylaxis    Family History: Family History  Problem Relation Age of Onset   Asthma Mother    Heart attack Mother    Colon polyps Neg Hx    Colon cancer Neg Hx    Esophageal cancer Neg Hx    Rectal cancer Neg Hx    Stomach cancer Neg Hx     Social History:  reports that he quit smoking about 10 months ago. His smoking use included cigarettes. He has a 11.25 pack-year smoking history. He has never used smokeless tobacco. He reports current alcohol use of about 12.0 standard drinks per week. He reports that he does not use drugs.  ROS: All other review of systems were reviewed and are negative except what is noted above in HPI  Physical Exam: BP 119/60   Pulse (!) 54   Constitutional:  Alert and oriented, No acute distress. HEENT: [place] AT, moist mucus membranes.  Trachea midline, no masses. Cardiovascular: No clubbing, cyanosis, or edema. Respiratory: Normal respiratory effort, no increased work of breathing. GI: Abdomen is soft, nontender, nondistended, no abdominal masses GU: No CVA tenderness.  Lymph: No cervical or inguinal lymphadenopathy. Skin: No rashes, bruises or suspicious lesions. Neurologic: Grossly intact, no focal deficits, moving all 4  extremities. Psychiatric: Normal mood and affect.  Laboratory Data: Lab Results  Component Value Date   WBC 8.1 12/27/2021   HGB 13.5 12/27/2021   HCT 41.1 12/27/2021   MCV 92.4 12/27/2021   PLT 286.0 12/27/2021    Lab Results  Component Value Date   CREATININE 1.05 12/16/2021    Lab Results  Component Value Date   PSA 0.9 05/23/2016   PSA 3.16 09/23/2015    No results found for: TESTOSTERONE  Lab Results  Component Value Date   HGBA1C 7.5 (H) 12/16/2021    Urinalysis    Component Value Date/Time   COLORURINE RED (A) 10/28/2021 1253   APPEARANCEUR Clear 12/16/2021 0900   LABSPEC  10/28/2021 1253    TEST NOT REPORTED DUE TO COLOR INTERFERENCE OF URINE PIGMENT   PHURINE  10/28/2021 1253    TEST NOT REPORTED  DUE TO COLOR INTERFERENCE OF URINE PIGMENT   GLUCOSEU Negative 12/16/2021 0900   HGBUR (A) 10/28/2021 1253    TEST NOT REPORTED DUE TO COLOR INTERFERENCE OF URINE PIGMENT   BILIRUBINUR Negative 12/16/2021 0900   KETONESUR (A) 10/28/2021 1253    TEST NOT REPORTED DUE TO COLOR INTERFERENCE OF URINE PIGMENT   PROTEINUR Trace 12/16/2021 0900   PROTEINUR (A) 10/28/2021 1253    TEST NOT REPORTED DUE TO COLOR INTERFERENCE OF URINE PIGMENT   UROBILINOGEN 0.2 06/23/2016 1441   UROBILINOGEN 1.0 12/17/2013 0528   NITRITE Negative 12/16/2021 0900   NITRITE (A) 10/28/2021 1253    TEST NOT REPORTED DUE TO COLOR INTERFERENCE OF URINE PIGMENT   LEUKOCYTESUR 2+ (A) 12/16/2021 0900   LEUKOCYTESUR (A) 10/28/2021 1253    TEST NOT REPORTED DUE TO COLOR INTERFERENCE OF URINE PIGMENT    Lab Results  Component Value Date   LABMICR See below: 12/16/2021   WBCUA >30 (A) 12/16/2021   LABEPIT 0-10 12/16/2021   MUCUS Present 11/03/2021   BACTERIA None seen 12/16/2021    Pertinent Imaging:  No results found for this or any previous visit.  No results found for this or any previous visit.  No results found for this or any previous visit.  No results found for this or any  previous visit.  No results found for this or any previous visit.  No results found for this or any previous visit.  No results found for this or any previous visit.  Results for orders placed during the hospital encounter of 10/28/21  CT Renal Stone Study  Narrative CLINICAL DATA:  Nephrolithiasis, hematuria.  EXAM: CT ABDOMEN AND PELVIS WITHOUT CONTRAST  TECHNIQUE: Multidetector CT imaging of the abdomen and pelvis was performed following the standard protocol without IV contrast.  RADIATION DOSE REDUCTION: This exam was performed according to the departmental dose-optimization program which includes automated exposure control, adjustment of the mA and/or kV according to patient size and/or use of iterative reconstruction technique.  COMPARISON:  CT Dec 17, 2013 and January 14, 2016  FINDINGS: Lower chest: Bibasilar atelectasis versus scarring.  Hepatobiliary: No suspicious hepatic lesion on this noncontrast examination. Cholelithiasis without findings of acute cholecystitis. No biliary ductal dilation.  Pancreas: No pancreatic ductal dilation or evidence of acute inflammation.  Spleen: No splenomegaly or focal splenic lesion.  Adrenals/Urinary Tract: Bilateral adrenal glands appear normal.  No hydronephrosis. No renal, ureteral or bladder calculi. Exophytic 1.2 cm left renal cyst.  Endophytic 2.3 cm soft tissue mass extending from the anterior urinary bladder wall on image 69/2.  Stomach/Bowel: No enteric contrast was administered. Stomach is unremarkable for degree of distension. Diverticulum from the third part of the duodenum. No pathologic dilation of small or large bowel. The appendix and terminal ileum appear normal. Colonic diverticulosis without findings of acute diverticulitis. No evidence of acute bowel inflammation.  Vascular/Lymphatic: Aortic atherosclerosis without aneurysmal dilation. No pathologically enlarged abdominal or pelvic  lymph nodes.  Reproductive: Heterogeneous enlargement of the prostate gland. Calcifications of the vas deferens and seminal vesicles.  Other: No significant abdominopelvic free fluid.  Musculoskeletal: Advanced multilevel degenerative changes spine. 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 No aggressive lytic or blastic lesion of bone.  IMPRESSION: 1. Endophytic 2.3 cm soft tissue mass extending from the anterior urinary bladder wall, suspicious for bladder neoplasm. Suggest urology consult in further evaluation with cystoscopy. 2. Heterogeneous enlargement of the prostate gland. 3. Colonic diverticulosis without findings  of acute diverticulitis. 4. Cholelithiasis without findings of acute cholecystitis. 5.  Aortic Atherosclerosis (ICD10-I70.0).   Electronically Signed By: Dahlia Bailiff M.D. On: 10/28/2021 13:35   Assessment & Plan:    1. Urinary retention -rapaflo 39m qhs - Urinalysis, Routine w reflex microscopic - BLADDER SCAN AMB NON-IMAGING  2. Benign localized prostatic hyperplasia with lower urinary tract symptoms (LUTS) -Rapaflo 836mqhs - BLADDER SCAN AMB NON-IMAGING  3. Bladder cancer -We will schedule to start BCG in the next 2 weeks.    No follow-ups on file.  PaNicolette BangMD  [facility] [place]

## 2022-01-14 NOTE — Progress Notes (Signed)
post void residual=28 

## 2022-01-18 NOTE — Progress Notes (Signed)
[facility] [phone]   Fax:([phone]) [place] NOTE  Patient Care Team: Wendie Agreste, MD as PCP - General (Family Medicine) Nickie Retort, MD (Inactive) as Consulting Physician (Urology) Michael Boston, MD as Consulting Physician (General Surgery) Mauri Pole, MD as Consulting Physician (Gastroenterology)  CHIEF COMPLAINTS/PURPOSE OF CONSULTATION:  "Eosinophilia "  HISTORY OF PRESENTING ILLNESS:  Phillip Maynard. 81 y.o. male with medical history significant for asthma, hypertension, T2DM, hyperlipidemia, hepatitis C that was treated, GERD and recently diagnosed bladder cancer.   On review of the previous records, there is evidence of eosinophilia starting June 2020. Most recent labs from 12/27/2021 showed normal WBC of 8.1, mild elevation of absolute eosinophil count measuring 1.3 K/uL.   On exam today, [name] is feeling well with no changes in his energy levels.  He feels like he can complete all his daily activities on his own.  His appetite is improving and he denies any recent weight loss.  He denies any nausea, vomiting or abdominal pain.  He reports some constipation with a bowel movement every 3 days.  He does not take any stool softeners or laxatives at this time.  Site of 1 episode of hematuria and that led to his recent diagnosis of bladder cancer, he denies any subsequent episodes of hematuria or other signs of bleeding.  He denies fevers, chills, night sweats, shortness of breath, chest pain or cough.  He has no other complaints.  Rest of the 10 point ROS is below.  MEDICAL HISTORY:  Past Medical History:  Diagnosis Date   Asthma    "when I was a boy" (04/08/2013)   Bronchitis    Colon polyps    Colovesical fistula    COPD (chronic obstructive pulmonary disease) (HCC)    Diverticulosis    GERD (gastroesophageal reflux disease)    Hepatitis C    treated with injections   High cholesterol     Hypertension    Type II diabetes mellitus (HCC)    hx of . No longer on medicine    SURGICAL HISTORY: Past Surgical History:  Procedure Laterality Date   COLONOSCOPY  last 09/06/2016   CYSTOSCOPY N/A 09/08/2016   Procedure: FIREFLY INJECTIONS;  Surgeon: Cleon Gustin, MD;  [facility]: [facility];  Service: Urology;  Laterality: N/A;   CYSTOSCOPY W/ RETROGRADES Bilateral 11/18/2021   Procedure: CYSTOSCOPY WITH RETROGRADE PYELOGRAM;  Surgeon: Cleon Gustin, MD;  Location: [facility];  Service: Urology;  Laterality: Bilateral;   INCISION AND DRAINAGE OF WOUND Right 1970's   "leg" (04/08/2013)   LIVER BIOPSY  ~ 2011   POLYPECTOMY     PROCTOSCOPY N/A 09/08/2016   Procedure: RIGID PROCTOSCOPY;  Surgeon: Michael Boston, MD;  Location: [facility];  Service: General;  Laterality: N/A;   TONSILLECTOMY     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   Procedure: TRANSURETHRAL RESECTION OF BLADDER TUMOR (TURBT);  Surgeon: Cleon Gustin, MD;  Location: [facility];  Service: Urology;  Laterality: N/A;    SOCIAL HISTORY: Social History   Socioeconomic History   Marital status: Married    Spouse name: Not on file   Number of children: 2   Years of education: Not on file   Highest education level: Not on file  Occupational History   Occupation: truck driver    Employer: [employer]    Comment: retired  Tobacco Use   Smoking status: Former    Packs/day: 0.25  Years: 45.00    Pack years: 11.25    Types: Cigarettes    Quit date: 03/05/2021    Years since quitting: 0.8   Smokeless tobacco: Never  Vaping Use   Vaping Use: Never used  Substance and Sexual Activity   Alcohol use: Yes    Alcohol/week: 12.0 standard drinks    Types: 12 Cans of beer per week    Comment: 12 pack a week.   Drug use: No   Sexual activity: Yes  Other Topics Concern   Not on file  Social History Narrative   Pt lives in 1 story home with his wife   Has 2 adult children   Highest level of education:  GED & Trade school   Retired Media planner.    Social Determinants of Health   Financial Resource Strain: Low Risk    Difficulty of Paying Living Expenses: Not hard at all  Food Insecurity: No Food Insecurity   Worried About Charity fundraiser in the Last Year: Never true   [place] in the Last Year: Never true  Transportation Needs: No Transportation Needs   Lack of Transportation (Medical): No   Lack of Transportation (Non-Medical): No  Physical Activity: Inactive   Days of Exercise per Week: 0 days   Minutes of Exercise per Session: 0 min  Stress: No Stress Concern Present   Feeling of Stress : Not at all  Social Connections: Moderately Integrated   Frequency of Communication with Friends and Family: Twice a week   Frequency of Social Gatherings with Friends and Family: Twice a week   Attends Religious Services: 1 to 4 times per year   Active Member of [employer] or Organizations: No   Attends Music therapist: Never   Marital Status: Married  Human resources officer Violence: Not At Risk   Fear of Current or Ex-Partner: No   Emotionally Abused: No   Physically Abused: No   Sexually Abused: No    FAMILY HISTORY: Family History  Problem Relation Age of Onset   Asthma Mother    Heart attack Mother    Colon polyps Neg Hx    Colon cancer Neg Hx    Esophageal cancer Neg Hx    Rectal cancer Neg Hx    Stomach cancer Neg Hx     ALLERGIES:  is allergic to penicillins and shellfish allergy.  MEDICATIONS:  Current Outpatient Medications  Medication Sig Dispense Refill   albuterol (VENTOLIN HFA) 108 (90 Base) MCG/ACT inhaler TAKE 2 PUFFS BY MOUTH EVERY 6 HOURS AS NEEDED FOR WHEEZE OR SHORTNESS OF BREATH 18 each 1   atorvastatin (LIPITOR) 10 MG tablet TAKE 1 TABLET EVERY DAY 90 tablet 1   blood glucose meter kit and supplies Dispense based on patient and insurance preference. Use up to four times daily as directed. (FOR ICD-10 E10.9, E11.9). 1 each 0    finasteride (PROSCAR) 5 MG tablet Take 1 tablet (5 mg total) by mouth daily. 90 tablet 3   Fluticasone-Umeclidin-Vilant (TRELEGY ELLIPTA) 100-62.5-25 MCG/ACT AEPB Inhale 1 puff into the lungs daily. 180 each 1   losartan-hydrochlorothiazide (HYZAAR) 50-12.5 MG tablet Take 1 tablet by mouth daily. 90 tablet 1   metFORMIN (GLUCOPHAGE) 500 MG tablet Take 1 tablet (500 mg total) by mouth 2 (two) times daily with a meal. 90 tablet 1   oxyCODONE-acetaminophen (PERCOCET) 5-325 MG tablet Take 1 tablet by mouth every 4 (four) hours as needed for severe pain. 30 tablet 0  tamsulosin (FLOMAX) 0.4 MG CAPS capsule Take 2 capsules (0.8 mg total) by mouth at bedtime. 60 capsule 11   sildenafil (REVATIO) 20 MG tablet Take 1 tablet (20 mg total) by mouth as needed. 30 tablet 6   sildenafil (VIAGRA) 100 MG tablet Take 1 tablet (100 mg total) by mouth daily as needed for erectile dysfunction. 6 tablet 0   silodosin (RAPAFLO) 8 MG CAPS capsule Take 1 capsule (8 mg total) by mouth daily with breakfast. 30 capsule 11   silodosin (RAPAFLO) 8 MG CAPS capsule Take 1 capsule (8 mg total) by mouth at bedtime. 30 capsule 11   No current facility-administered medications for this visit.    REVIEW OF SYSTEMS:   Constitutional: ( - ) fevers, ( - )  chills , ( - ) night sweats Eyes: ( - ) blurriness of vision, ( - ) double vision, ( - ) watery eyes Ears, nose, mouth, throat, and face: ( - ) mucositis, ( - ) sore throat Respiratory: ( - ) cough, ( - ) dyspnea, ( - ) wheezes Cardiovascular: ( - ) palpitation, ( - ) chest discomfort, ( - ) lower extremity swelling Gastrointestinal:  ( - ) nausea, ( - ) heartburn, ( - ) change in bowel habits Skin: ( - ) abnormal skin rashes Lymphatics: ( - ) new lymphadenopathy, ( - ) easy bruising Neurological: ( - ) numbness, ( - ) tingling, ( - ) new weaknesses Behavioral/Psych: ( - ) mood change, ( - ) new changes  All other systems were reviewed with the patient and are  negative.  PHYSICAL EXAMINATION: ECOG PERFORMANCE STATUS: 0 - Asymptomatic  Vitals:   01/19/22 1116  BP: (!) 143/66  Pulse: 64  Resp: 18  Temp: 98.2 F (36.8 C)  SpO2: 100%   Filed Weights   01/19/22 1116  Weight: 185 lb 9.6 oz (84.2 kg)    GENERAL: well appearing male in NAD  SKIN: skin color, texture, turgor are normal, no rashes or significant lesions EYES: conjunctiva are pink and non-injected, sclera clear OROPHARYNX: no exudate, no erythema; lips, buccal mucosa, and tongue normal  LYMPH:  no palpable lymphadenopathy in the cervical or supraclavicular lymph nodes.  LUNGS: clear to auscultation and percussion with normal breathing effort HEART: regular rate & rhythm and no murmurs and no lower extremity edema ABDOMEN: soft, non-tender, non-distended, normal bowel sounds Musculoskeletal: no cyanosis of digits and no clubbing  PSYCH: alert & oriented x 3, fluent speech NEURO: no focal motor/sensory deficits  LABORATORY DATA:  I have reviewed the data as listed    Latest Ref Rng & Units 01/19/2022   12:28 PM 12/27/2021   12:30 PM 12/16/2021    9:00 AM  CBC  WBC 4.0 - 10.5 K/uL 8.1   8.1   7.2    Hemoglobin 13.0 - 17.0 g/dL 14.5   13.5   13.7    Hematocrit 39.0 - 52.0 % 43.8   41.1   41.3    Platelets 150 - 400 K/uL 247   286.0   304.0         Latest Ref Rng & Units 01/19/2022   12:28 PM 12/16/2021    9:00 AM 09/29/2021    9:48 AM  CMP  Glucose 70 - 99 mg/dL 105   170   153    BUN 8 - 23 mg/dL _0 Creatinine 0.61 - 1.24 mg/dL 1.00   1.05   1.13  Sodium 135 - 145 mmol/L 138   137   135    Potassium 3.5 - 5.1 mmol/L 3.8   3.7   3.5    Chloride 98 - 111 mmol/L 101   100   97    CO2 22 - 32 mmol/L _0 Calcium 8.9 - 10.3 mg/dL 9.9   9.3   9.5    Total Protein 6.5 - 8.1 g/dL 7.9   7.1   8.9    Total Bilirubin 0.3 - 1.2 mg/dL 0.5   0.9   0.6    Alkaline Phos 38 - 126 U/L 54   47   49    AST 15 - 41 U/L _1 ALT 0 - 44 U/L _2 ASSESSMENT & PLAN Phillip Maynard. Is a 81 y.o. male who presents to the clinic for initial evaluation for eosinophilia.  We reviewed underlying causes for eosinophilia including parasitic infections, hypersensitivity reactions including dermatitis and asthma, and certain malignancies.  He has history of longstanding asthma which is managed by his pulmonologist, Dr. Erin Fulling from [facility]. Upon review of prior labs, there is mild eosinophilia most recently with absolute eosinophil count less than 1.5 K/uL which is very reassuring and not suggestive of hypereosinophilic syndrome. We will proceed with serologic workup today to check CBC, CMP, LDH, sed rate, CRP, peripheral smear, vitamin B12 and tryptase levels.   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  --RTC in 6 months to monitor labs unless above workup requires further intervention.   Orders Placed This Encounter  Procedures   CBC with Differential ([place] Only)    Standing Status:   Future    Number of Occurrences:   1    Standing Expiration Date:   01/20/2023   CMP ([place] only)    Standing Status:   Future    Number of Occurrences:   1    Standing Expiration Date:   01/20/2023   Lactate dehydrogenase (LDH)    Standing Status:   Future    Number of Occurrences:   1    Standing Expiration Date:   01/19/2023   Save Smear (SSMR)    Standing Status:   Future    Number of Occurrences:   1    Standing Expiration Date:   01/20/2023   Sedimentation rate    Standing Status:   Future    Number of Occurrences:   1    Standing Expiration Date:   01/19/2023   C-reactive protein    Standing Status:   Future    Number of Occurrences:   1    Standing Expiration Date:   01/19/2023   Tryptase    Standing Status:   Future    Number of Occurrences:   1    Standing Expiration Date:   01/19/2023    Vitamin B12    Standing Status:   Future    Number of Occurrences:   1    Standing Expiration Date:   01/19/2023   Strongyloides, Ab, IgG    All questions were answered. The patient knows to call the clinic with any problems, questions or concerns.  I have spent a total of 60 minutes minutes of face-to-face  and non-face-to-face time, preparing to see the patient, obtaining and/or reviewing separately obtained history, performing a medically appropriate examination, counseling and educating the patient, ordering tests, documenting clinical information in the electronic health record, and care coordination.   Dede Query, PA-C Department of Hematology/Oncology [place] at [facility] Phone: [phone]  Patient was seen with Dr. Lorenso Courier  I have read the above note and personally examined the patient. I agree with the assessment and plan as noted above.  Briefly [name] is a 81 year old male who presents for evaluation of a mild eosinophilia.  The patient does have a history of asthma which is likely contributing to his increased eosinophil count.  His levels are not high enough to be concerning for HES.  We will order vitamin B12 and tryptase levels just to be certain.  Patient also has no travel or no symptoms concerning for parasitic infection.  Overall findings are quite mild and can be well explained by his asthma.  Would recommend routine follow-up in 6 months to assure no major changes in the eosinophil count, afterwards he can be followed by his PCP if no concerning findings are seen in the above work-up.   Ledell Peoples, MD Department of Hematology/Oncology [place] at [facility] Phone: [phone] Pager: [phone] Email: [email or web address]

## 2022-01-19 ENCOUNTER — Inpatient Hospital Stay: Payer: Medicare (Managed Care)

## 2022-01-19 ENCOUNTER — Other Ambulatory Visit: Payer: Self-pay

## 2022-01-19 ENCOUNTER — Encounter: Payer: Self-pay | Admitting: Physician Assistant

## 2022-01-19 ENCOUNTER — Inpatient Hospital Stay: Payer: Medicare (Managed Care) | Attending: Physician Assistant | Admitting: Physician Assistant

## 2022-01-19 VITALS — BP 143/66 | HR 64 | Temp 98.2°F | Resp 18 | Ht 68.0 in | Wt 185.6 lb

## 2022-01-19 DIAGNOSIS — D7219 Other eosinophilia: Secondary | ICD-10-CM

## 2022-01-19 DIAGNOSIS — Z87891 Personal history of nicotine dependence: Secondary | ICD-10-CM | POA: Insufficient documentation

## 2022-01-19 DIAGNOSIS — E7800 Pure hypercholesterolemia, unspecified: Secondary | ICD-10-CM | POA: Insufficient documentation

## 2022-01-19 DIAGNOSIS — R319 Hematuria, unspecified: Secondary | ICD-10-CM | POA: Diagnosis not present

## 2022-01-19 DIAGNOSIS — E785 Hyperlipidemia, unspecified: Secondary | ICD-10-CM | POA: Diagnosis not present

## 2022-01-19 DIAGNOSIS — Z7984 Long term (current) use of oral hypoglycemic drugs: Secondary | ICD-10-CM | POA: Insufficient documentation

## 2022-01-19 DIAGNOSIS — J449 Chronic obstructive pulmonary disease, unspecified: Secondary | ICD-10-CM | POA: Insufficient documentation

## 2022-01-19 DIAGNOSIS — K5900 Constipation, unspecified: Secondary | ICD-10-CM | POA: Insufficient documentation

## 2022-01-19 DIAGNOSIS — Z8719 Personal history of other diseases of the digestive system: Secondary | ICD-10-CM | POA: Diagnosis not present

## 2022-01-19 DIAGNOSIS — I10 Essential (primary) hypertension: Secondary | ICD-10-CM | POA: Diagnosis not present

## 2022-01-19 DIAGNOSIS — D721 Eosinophilia, unspecified: Secondary | ICD-10-CM | POA: Insufficient documentation

## 2022-01-19 DIAGNOSIS — E119 Type 2 diabetes mellitus without complications: Secondary | ICD-10-CM | POA: Insufficient documentation

## 2022-01-19 DIAGNOSIS — Z79899 Other long term (current) drug therapy: Secondary | ICD-10-CM | POA: Insufficient documentation

## 2022-01-19 DIAGNOSIS — K219 Gastro-esophageal reflux disease without esophagitis: Secondary | ICD-10-CM | POA: Diagnosis not present

## 2022-01-19 DIAGNOSIS — J45909 Unspecified asthma, uncomplicated: Secondary | ICD-10-CM | POA: Diagnosis not present

## 2022-01-19 LAB — CMP (CANCER CENTER ONLY)
ALT: 10 U/L (ref 0–44)
AST: 13 U/L — ABNORMAL LOW (ref 15–41)
Albumin: 4.3 g/dL (ref 3.5–5.0)
Alkaline Phosphatase: 54 U/L (ref 38–126)
Anion gap: 6 (ref 5–15)
BUN: 10 mg/dL (ref 8–23)
CO2: 31 mmol/L (ref 22–32)
Calcium: 9.9 mg/dL (ref 8.9–10.3)
Chloride: 101 mmol/L (ref 98–111)
Creatinine: 1 mg/dL (ref 0.61–1.24)
GFR, Estimated: >60 mL/min (ref >60)
Glucose, Bld: 105 mg/dL — ABNORMAL HIGH (ref 70–99)
Potassium: 3.8 mmol/L (ref 3.5–5.1)
Sodium: 138 mmol/L (ref 135–145)
Total Bilirubin: 0.5 mg/dL (ref 0.3–1.2)
Total Protein: 7.9 g/dL (ref 6.5–8.1)

## 2022-01-19 LAB — CBC WITH DIFFERENTIAL (CANCER CENTER ONLY)
Abs Immature Granulocytes: 0.02 10*3/uL (ref 0.00–0.07)
Basophils Absolute: 0.1 10*3/uL (ref 0.0–0.1)
Basophils Relative: 1 %
Eosinophils Absolute: 1.1 10*3/uL — ABNORMAL HIGH (ref 0.0–0.5)
Eosinophils Relative: 14 %
HCT: 43.8 % (ref 39.0–52.0)
Hemoglobin: 14.5 g/dL (ref 13.0–17.0)
Immature Granulocytes: 0 %
Lymphocytes Relative: 33 %
Lymphs Abs: 2.7 10*3/uL (ref 0.7–4.0)
MCH: 30.4 pg (ref 26.0–34.0)
MCHC: 33.1 g/dL (ref 30.0–36.0)
MCV: 91.8 fL (ref 80.0–100.0)
Monocytes Absolute: 0.5 10*3/uL (ref 0.1–1.0)
Monocytes Relative: 6 %
Neutro Abs: 3.6 10*3/uL (ref 1.7–7.7)
Neutrophils Relative %: 46 %
Platelet Count: 247 10*3/uL (ref 150–400)
RBC: 4.77 MIL/uL (ref 4.22–5.81)
RDW: 14.2 % (ref 11.5–15.5)
WBC Count: 8.1 10*3/uL (ref 4.0–10.5)
nRBC: 0 % (ref 0.0–0.2)

## 2022-01-19 LAB — VITAMIN B12: Vitamin B-12: 467 pg/mL (ref 180–914)

## 2022-01-19 LAB — C-REACTIVE PROTEIN: CRP: 1.1 mg/dL — ABNORMAL HIGH (ref <1.0)

## 2022-01-19 LAB — LACTATE DEHYDROGENASE: LDH: 137 U/L (ref 98–192)

## 2022-01-19 LAB — SAVE SMEAR(SSMR), FOR PROVIDER SLIDE REVIEW

## 2022-01-19 LAB — SEDIMENTATION RATE: Sed Rate: 23 mm/hr — ABNORMAL HIGH (ref 0–16)

## 2022-01-20 ENCOUNTER — Encounter: Payer: Self-pay | Admitting: Family Medicine

## 2022-01-20 ENCOUNTER — Telehealth: Payer: Self-pay | Admitting: Hematology and Oncology

## 2022-01-20 LAB — STRONGYLOIDES, AB, IGG: Strongyloides, Ab, IgG: NEGATIVE

## 2022-01-20 NOTE — Telephone Encounter (Signed)
Scheduled per 6/7 los, message has been left with pt

## 2022-01-21 LAB — TRYPTASE: Tryptase: 6.2 ug/L (ref 2.2–13.2)

## 2022-01-25 ENCOUNTER — Encounter: Payer: Self-pay | Admitting: Physician Assistant

## 2022-01-25 ENCOUNTER — Encounter: Payer: Self-pay | Admitting: Urology

## 2022-01-25 NOTE — Patient Instructions (Signed)

## 2022-01-28 ENCOUNTER — Ambulatory Visit (INDEPENDENT_AMBULATORY_CARE_PROVIDER_SITE_OTHER): Payer: Medicare (Managed Care) | Admitting: Physician Assistant

## 2022-01-28 VITALS — BP 130/64 | HR 67 | Ht 68.0 in | Wt 186.0 lb

## 2022-01-28 DIAGNOSIS — C679 Malignant neoplasm of bladder, unspecified: Secondary | ICD-10-CM

## 2022-01-28 DIAGNOSIS — N401 Enlarged prostate with lower urinary tract symptoms: Secondary | ICD-10-CM | POA: Diagnosis not present

## 2022-01-28 LAB — URINALYSIS, ROUTINE W REFLEX MICROSCOPIC
Bilirubin, UA: NEGATIVE
Glucose, UA: NEGATIVE
Ketones, UA: NEGATIVE
Leukocytes,UA: NEGATIVE
Nitrite, UA: NEGATIVE
Protein,UA: NEGATIVE
RBC, UA: NEGATIVE
Specific Gravity, UA: 1.02 (ref 1.005–1.030)
Urobilinogen, Ur: 0.2 mg/dL (ref 0.2–1.0)
pH, UA: 5 (ref 5.0–7.5)

## 2022-01-28 MED ORDER — BCG LIVE 50 MG IS SUSR
3.2400 mL | Freq: Once | INTRAVESICAL | Status: AC
  Administered 2022-01-28: 81 mg via INTRAVESICAL

## 2022-01-28 NOTE — Progress Notes (Signed)
BCG Bladder Instillation  BCG # 1 of 6  Due to Bladder Cancer patient is present today for a BCG treatment. Patient was cleaned and prepped in a sterile fashion with betadine. A 14FR catheter was inserted, urine return was noted 55m, urine was yellow in color.  572mof reconstituted BCG was instilled into the bladder. The catheter was then removed. Patient tolerated well, no complications were noted  Performed by: KoLevi AlandCMA  Follow up/ Additional notes: Follow up as scheduled.

## 2022-01-28 NOTE — Progress Notes (Signed)
Assessment: 1. Malignant neoplasm of urinary bladder, unspecified site ([place])  2. Benign localized prostatic hyperplasia with lower urinary tract symptoms (LUTS) - bcg vaccine injection 81 mg - Urinalysis, Routine w reflex microscopic    Plan: Patient will follow-up next week for BCG 2/6  Chief Complaint: No chief complaint on file.   HPI: Phillip Maynard. is a 81 y.o. male who presents for BCG treatment 1/6 and continued evaluation of bladder cancer.  He has no complaints today and continues with strong stream and minimal LUTS.  All questions concerning today's treatment answered.  UA is clear  01/14/22 Phillip Maynard is a 81yo here for followup for BPH and after bladder tumor excision IPSS 7 QOl 1 on rapaflo '8mg'$ . Urine stream strong. No straining to urinate. Nocturia 0-2x depending on fluid consumption. Pathology from bladder tumor was T1G3.  No other complaints today  Portions of the above documentation were copied from a prior visit for review purposes only.  Allergies: Allergies  Allergen Reactions   Penicillins Anaphylaxis and Other (See Comments)    Has patient had a PCN reaction causing immediate rash, facial/tongue/throat swelling, SOB or lightheadedness with hypotension: Yes Has patient had a PCN reaction causing severe rash involving mucus membranes or skin necrosis: No Has patient had a PCN reaction that required hospitalization No Has patient had a PCN reaction occurring within the last 10 years: No If all of the above answers are "NO", then may proceed with Cephalosporin use.   Shellfish Allergy Anaphylaxis    PMH: Past Medical History:  Diagnosis Date   Asthma    "when I was a boy" (04/08/2013)   Bronchitis    Colon polyps    Colovesical fistula    COPD (chronic obstructive pulmonary disease) (HCC)    Diverticulosis    GERD (gastroesophageal reflux disease)    Hepatitis C    treated with injections   High cholesterol    Hypertension    Type II  diabetes mellitus (HCC)    hx of . No longer on medicine    PSH: Past Surgical History:  Procedure Laterality Date   COLONOSCOPY  last 09/06/2016   CYSTOSCOPY N/A 09/08/2016   Procedure: FIREFLY INJECTIONS;  Surgeon: Cleon Gustin, MD;  Location: [facility];  Service: Urology;  Laterality: N/A;   CYSTOSCOPY W/ RETROGRADES Bilateral 11/18/2021   Procedure: CYSTOSCOPY WITH RETROGRADE PYELOGRAM;  Surgeon: Cleon Gustin, MD;  Location: [facility];  Service: Urology;  Laterality: Bilateral;   INCISION AND DRAINAGE OF WOUND Right 1970's   "leg" (04/08/2013)   LIVER BIOPSY  ~ 2011   POLYPECTOMY     PROCTOSCOPY N/A 09/08/2016   Procedure: RIGID PROCTOSCOPY;  Surgeon: Michael Boston, MD;  Location: [facility];  Service: General;  Laterality: N/A;   TONSILLECTOMY     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   Procedure: TRANSURETHRAL RESECTION OF BLADDER TUMOR (TURBT);  Surgeon: Cleon Gustin, MD;  Location: [facility];  Service: Urology;  Laterality: N/A;    SH: Social History   Tobacco Use   Smoking status: Former    Packs/day: 0.25    Years: 45.00    Total pack years: 11.25    Types: Cigarettes    Quit date: 03/05/2021    Years since quitting: 0.9   Smokeless tobacco: Never  Vaping Use   Vaping Use: Never used  Substance Use Topics   Alcohol use: Yes    Alcohol/week: 12.0 standard drinks of alcohol  Types: 12 Cans of beer per week    Comment: 12 pack a week.   Drug use: No    ROS: See HPI  PE: BP 130/64   Pulse 67   Ht '5\' 8"'$  (1.727 m)   Wt 186 lb (84.4 kg)   BMI 28.28 kg/m  GENERAL APPEARANCE:  Well appearing, well developed, well nourished, NAD HEENT:  Atraumatic, normocephalic NECK:  Supple. Trachea midline ABDOMEN:  Soft, non-tender, no masses EXTREMITIES:  Moves all extremities well, without clubbing, cyanosis, or edema NEUROLOGIC:  Alert and oriented x 3, normal gait, CN II-XII grossly intact MENTAL STATUS:  appropriate BACK:  Non-tender to  palpation, No CVAT SKIN:  Warm, dry, and intact   Results: Laboratory Data: Lab Results  Component Value Date   WBC 8.1 01/19/2022   HGB 14.5 01/19/2022   HCT 43.8 01/19/2022   MCV 91.8 01/19/2022   PLT 247 01/19/2022    Lab Results  Component Value Date   CREATININE 1.00 01/19/2022    Lab Results  Component Value Date   PSA 0.9 05/23/2016   PSA 3.16 09/23/2015    No results found for: "TESTOSTERONE"  Lab Results  Component Value Date   HGBA1C 7.5 (H) 12/16/2021    Urinalysis    Component Value Date/Time   COLORURINE RED (A) 10/28/2021 1253   APPEARANCEUR Clear 01/14/2022 1249   LABSPEC  10/28/2021 1253    TEST NOT REPORTED DUE TO COLOR INTERFERENCE OF URINE PIGMENT   PHURINE  10/28/2021 1253    TEST NOT REPORTED DUE TO COLOR INTERFERENCE OF URINE PIGMENT   GLUCOSEU Negative 01/14/2022 1249   HGBUR (A) 10/28/2021 1253    TEST NOT REPORTED DUE TO COLOR INTERFERENCE OF URINE PIGMENT   BILIRUBINUR Negative 01/14/2022 1249   KETONESUR (A) 10/28/2021 1253    TEST NOT REPORTED DUE TO COLOR INTERFERENCE OF URINE PIGMENT   PROTEINUR Negative 01/14/2022 1249   PROTEINUR (A) 10/28/2021 1253    TEST NOT REPORTED DUE TO COLOR INTERFERENCE OF URINE PIGMENT   UROBILINOGEN 0.2 06/23/2016 1441   UROBILINOGEN 1.0 12/17/2013 0528   NITRITE Negative 01/14/2022 1249   NITRITE (A) 10/28/2021 1253    TEST NOT REPORTED DUE TO COLOR INTERFERENCE OF URINE PIGMENT   LEUKOCYTESUR Trace (A) 01/14/2022 1249   LEUKOCYTESUR (A) 10/28/2021 1253    TEST NOT REPORTED DUE TO COLOR INTERFERENCE OF URINE PIGMENT    Lab Results  Component Value Date   LABMICR See below: 01/14/2022   WBCUA 0-5 01/14/2022   LABEPIT 0-10 01/14/2022   MUCUS Present 11/03/2021   BACTERIA None seen 01/14/2022    Pertinent Imaging:  No results found for this or any previous visit.  No results found for this or any previous visit.  No results found for this or any previous visit.  No results found for  this or any previous visit.  No results found for this or any previous visit.  No results found for this or any previous visit.  No results found for this or any previous visit.  Results for orders placed during the hospital encounter of 10/28/21  CT Renal Stone Study  Narrative CLINICAL DATA:  Nephrolithiasis, hematuria.  EXAM: CT ABDOMEN AND PELVIS WITHOUT CONTRAST  TECHNIQUE: Multidetector CT imaging of the abdomen and pelvis was performed following the standard protocol without IV contrast.  RADIATION DOSE REDUCTION: This exam was performed according to the departmental dose-optimization program which includes automated exposure control, adjustment of the mA and/or kV according to patient  size and/or use of iterative reconstruction technique.  COMPARISON:  CT Dec 17, 2013 and January 14, 2016  FINDINGS: Lower chest: Bibasilar atelectasis versus scarring.  Hepatobiliary: No suspicious hepatic lesion on this noncontrast examination. Cholelithiasis without findings of acute cholecystitis. No biliary ductal dilation.  Pancreas: No pancreatic ductal dilation or evidence of acute inflammation.  Spleen: No splenomegaly or focal splenic lesion.  Adrenals/Urinary Tract: Bilateral adrenal glands appear normal.  No hydronephrosis. No renal, ureteral or bladder calculi. Exophytic 1.2 cm left renal cyst.  Endophytic 2.3 cm soft tissue mass extending from the anterior urinary bladder wall on image 69/2.  Stomach/Bowel: No enteric contrast was administered. Stomach is unremarkable for degree of distension. Diverticulum from the third part of the duodenum. No pathologic dilation of small or large bowel. The appendix and terminal ileum appear normal. Colonic diverticulosis without findings of acute diverticulitis. No evidence of acute bowel inflammation.  Vascular/Lymphatic: Aortic atherosclerosis without aneurysmal dilation. No pathologically enlarged abdominal or pelvic  lymph nodes.  Reproductive: Heterogeneous enlargement of the prostate gland. Calcifications of the vas deferens and seminal vesicles.  Other: No significant abdominopelvic free fluid.  Musculoskeletal: Advanced multilevel degenerative changes spine. 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 No aggressive lytic or blastic lesion of bone.  IMPRESSION: 1. Endophytic 2.3 cm soft tissue mass extending from the anterior urinary bladder wall, suspicious for bladder neoplasm. Suggest urology consult in further evaluation with cystoscopy. 2. Heterogeneous enlargement of the prostate gland. 3. Colonic diverticulosis without findings of acute diverticulitis. 4. Cholelithiasis without findings of acute cholecystitis. 5.  Aortic Atherosclerosis (ICD10-I70.0).   Electronically Signed By: Dahlia Bailiff M.D. On: 10/28/2021 13:35  No results found for this or any previous visit (from the past 24 hour(s)).

## 2022-01-28 NOTE — Patient Instructions (Signed)

## 2022-01-31 DIAGNOSIS — H25813 Combined forms of age-related cataract, bilateral: Secondary | ICD-10-CM | POA: Diagnosis not present

## 2022-01-31 DIAGNOSIS — H02206 Unspecified lagophthalmos left eye, unspecified eyelid: Secondary | ICD-10-CM | POA: Diagnosis not present

## 2022-01-31 DIAGNOSIS — H04123 Dry eye syndrome of bilateral lacrimal glands: Secondary | ICD-10-CM | POA: Diagnosis not present

## 2022-01-31 DIAGNOSIS — H02203 Unspecified lagophthalmos right eye, unspecified eyelid: Secondary | ICD-10-CM | POA: Diagnosis not present

## 2022-02-04 ENCOUNTER — Ambulatory Visit (INDEPENDENT_AMBULATORY_CARE_PROVIDER_SITE_OTHER): Payer: Medicare (Managed Care) | Admitting: Physician Assistant

## 2022-02-04 VITALS — BP 150/76 | HR 67 | Ht 68.0 in | Wt 184.0 lb

## 2022-02-04 DIAGNOSIS — C679 Malignant neoplasm of bladder, unspecified: Secondary | ICD-10-CM | POA: Diagnosis not present

## 2022-02-04 LAB — URINALYSIS, ROUTINE W REFLEX MICROSCOPIC
Bilirubin, UA: NEGATIVE
Glucose, UA: NEGATIVE
Ketones, UA: NEGATIVE
Leukocytes,UA: NEGATIVE
Nitrite, UA: NEGATIVE
Protein,UA: NEGATIVE
RBC, UA: NEGATIVE
Specific Gravity, UA: 1.025 (ref 1.005–1.030)
Urobilinogen, Ur: 0.2 mg/dL (ref 0.2–1.0)
pH, UA: 5.5 (ref 5.0–7.5)

## 2022-02-04 MED ORDER — BCG LIVE 50 MG IS SUSR
3.2400 mL | Freq: Once | INTRAVESICAL | Status: AC
  Administered 2022-02-04: 81 mg via INTRAVESICAL

## 2022-02-11 ENCOUNTER — Ambulatory Visit (INDEPENDENT_AMBULATORY_CARE_PROVIDER_SITE_OTHER): Payer: Medicare (Managed Care) | Admitting: Urology

## 2022-02-11 DIAGNOSIS — C679 Malignant neoplasm of bladder, unspecified: Secondary | ICD-10-CM

## 2022-02-11 LAB — MICROSCOPIC EXAMINATION
RBC, Urine: NONE SEEN /hpf (ref 0–2)
Renal Epithel, UA: NONE SEEN /hpf

## 2022-02-11 LAB — URINALYSIS, ROUTINE W REFLEX MICROSCOPIC
Bilirubin, UA: NEGATIVE
Glucose, UA: NEGATIVE
Ketones, UA: NEGATIVE
Nitrite, UA: NEGATIVE
RBC, UA: NEGATIVE
Specific Gravity, UA: 1.02 (ref 1.005–1.030)
Urobilinogen, Ur: 0.2 mg/dL (ref 0.2–1.0)
pH, UA: 5 (ref 5.0–7.5)

## 2022-02-11 MED ORDER — BCG LIVE 50 MG IS SUSR
3.2400 mL | Freq: Once | INTRAVESICAL | Status: AC
  Administered 2022-02-11: 81 mg via INTRAVESICAL

## 2022-02-11 NOTE — Progress Notes (Signed)
BCG Bladder Instillation  BCG # 3 of 6  Due to Bladder Cancer patient is present today for a BCG treatment. Patient was cleaned and prepped in a sterile fashion with betadine. A 14FR catheter was inserted, urine return was noted 23m, urine was yellow in color.  518mof reconstituted BCG was instilled into the bladder. The catheter was then removed. Patient tolerated well, no complications were noted  Performed by: Langdon Crosson LPN  Follow up/ Additional notes: keep scheduled NV

## 2022-02-18 ENCOUNTER — Ambulatory Visit (INDEPENDENT_AMBULATORY_CARE_PROVIDER_SITE_OTHER): Payer: Medicare (Managed Care) | Admitting: Physician Assistant

## 2022-02-18 VITALS — BP 132/72 | HR 66

## 2022-02-18 DIAGNOSIS — C679 Malignant neoplasm of bladder, unspecified: Secondary | ICD-10-CM

## 2022-02-18 LAB — MICROSCOPIC EXAMINATION
RBC, Urine: NONE SEEN /hpf (ref 0–2)
Renal Epithel, UA: NONE SEEN /hpf
WBC, UA: >30 /hpf — AB (ref 0–5)

## 2022-02-18 LAB — URINALYSIS, ROUTINE W REFLEX MICROSCOPIC
Bilirubin, UA: NEGATIVE
Glucose, UA: NEGATIVE
Ketones, UA: NEGATIVE
Nitrite, UA: NEGATIVE
Protein,UA: NEGATIVE
RBC, UA: NEGATIVE
Specific Gravity, UA: 1.015 (ref 1.005–1.030)
Urobilinogen, Ur: 0.2 mg/dL (ref 0.2–1.0)
pH, UA: 5 (ref 5.0–7.5)

## 2022-02-18 MED ORDER — BCG LIVE 50 MG IS SUSR
3.2400 mL | Freq: Once | INTRAVESICAL | Status: AC
  Administered 2022-02-18: 81 mg via INTRAVESICAL

## 2022-02-18 NOTE — Patient Instructions (Addendum)
Alternate ibuprofen and tylenol for relief of fever, chills and body aches. Take 2 ibuprofen, then 2 hours later, take 2 tylenol    Patient Education: (BCG) Into the Bladder (Intravesical Chemotherapy)  BCG is a vaccine which is used to prevent tuberculosis (TB).  But it's also a helpful treatment for some early bladder cancers.  When BCG goes directly into the bladder the treatment is described as intravesical.  BCG is a type of immunotherapy.  Immunotherapy stimulates the body's immune system to destroy cancer cells.  How it's given BCG treatment is given to you in an outpatient setting.  It takes a few minutes to administer and you can go home as soon as it's finished.  It might be a good idea to ask someone to bring you, particularly the fist time.  Unlike chemotherapy into the bladder, BCG treatment is never given immediately after surgery to remove bladder tumors.  There needs to be a delay usually of at least two weeks after surgery, before you can have it.  You won't be given treatment with BCG if you are unwell or have an infection in your urine.  You're usually asked to limit the amount you drink before your treatment.  This will help to increase the concentration of BCG in your bladder.  Drinking too much before your treatment may make your bladder feel uncomfortably full.  If you normally take water tablets (diuretics) take them later in the day after your treatment.  Your nurse or doctor will give you more advise about preparing for your treatment.  You will have a small tube (catheter) placed into your bladder.  Your doctor will then put the liquid vaccine directly into your bladder through the catheter and remove the catheter.  You will need to hold your urine for two hours afterwards.  This can be difficult but it's to give the treatment time to work.  You can walk around during this time.  When the treatment is over you can go to the toilet.  After your treatment there are some  precautions you'll need to take.  This is because BCG is a live vaccine and other people shouldn't be exposed to it.  For the next six hours, you'll need to avoid your urine splashing on the toilet seat and getting any urine on your hands.  It might be easer for men to sit down when they're using an ordinary toilet although using a stand up urinal should be alright.  The main this is to avoid splashing urine and spreading the vaccine.  You will also be asked to put 1/2 cup undiluted bleach into the toilet to destroy any live vaccine and leave it for 15 minutes until you flush.  Side Effects Because BCG goes directly into the bladder most of the side effects are linked with the bladder.  They usually go away within one to two days after your treatment.  The most common ones are: -needing to pass urine often -pain when you pass urine -blood in urine -flu-like symptoms (tiredness, general aching and raised temperature)  Theses side effects should settle down within a day or two.  If they don't get better contact your doctor.  Drinking lots of fluids can help flush the drug out of your bladder and reduce some of these effects.  Taking Ibuprofen or Aleve is encouraged unless you have a condition that would make these medications unsafe to take (renal failure, diabetes, gerd)  Rare side effects can include a continuing high  temperature (fever), pain in your joints and a cough.  If you have any of these symptoms, or if you feel generally unwell, contact your doctor.  These symptoms could be a sign of a more serious infection (due to BCG) that needs to be treated immediately.  If this happens you'll be treated with the same drugs (antibiotics) that are used to treat TB.  Contraception Men should use a condom during sex for the first 48 hours after their treatment.  If you are a women who has had BCG treatment then your partner should use a condom.  Using a condom will protect your partner from any vaccine  present in your semen or vaginal fluid.  We don't know how BCG may affect a developing fetus so it's not advisable to become pregnant or father a child while having it.  It is important to use effective contraception during your treatment and for six weeks afterwards.  You can discuss this with your doctor or specialist nurse.

## 2022-02-18 NOTE — Progress Notes (Unsigned)
BCG Bladder Instillation  BCG # 4 of 6  Due to Bladder Cancer patient is present today for a BCG treatment. Patient was cleaned and prepped in a sterile fashion with betadine. A 14FR catheter was inserted, urine return was noted 57m, urine was yellow in color.  554mof reconstituted BCG was instilled into the bladder. The catheter was then removed. Patient tolerated well, no complications were noted  Performed by: KoLevi AlandCMA  Follow up/ Additional notes: Follow up as scheduled.

## 2022-02-18 NOTE — Progress Notes (Signed)
Assessment: 1. Malignant neoplasm of urinary bladder, unspecified site ([facility]) - Urinalysis, Routine w reflex microscopic - bcg vaccine injection 81 mg    Plan: FU next week for BCG tx 5/6. Discussed expectations post tx today and pt will let us know if he has concerns before next week.  Chief Complaint: No chief complaint on file.   HPI: Phillip Maynard. is a 81 y.o. male who presents for continued evaluation of bladder cancer. He is scheduled for BCG tx 4/6 today and has tolerated treatments well. No gross hematuria  UA=6-10WBC, few bacteria, nitrite negative   Portions of the above documentation were copied from a prior visit for review purposes only.  Allergies: Allergies  Allergen Reactions   Penicillins Anaphylaxis and Other (See Comments)    Has patient had a PCN reaction causing immediate rash, facial/tongue/throat swelling, SOB or lightheadedness with hypotension: Yes Has patient had a PCN reaction causing severe rash involving mucus membranes or skin necrosis: No Has patient had a PCN reaction that required hospitalization No Has patient had a PCN reaction occurring within the last 10 years: No If all of the above answers are "NO", then may proceed with Cephalosporin use.   Shellfish Allergy Anaphylaxis    PMH: Past Medical History:  Diagnosis Date   Asthma    "when I was a boy" (04/08/2013)   Bronchitis    Colon polyps    Colovesical fistula    COPD (chronic obstructive pulmonary disease) (HCC)    Diverticulosis    GERD (gastroesophageal reflux disease)    Hepatitis C    treated with injections   High cholesterol    Hypertension    Type II diabetes mellitus (H[facility])    hx of . No longer on medicine    PSH: Past Surgical History:  Procedure Laterality Date   COLONOSCOPY  last 09/06/2016   CYSTOSCOPY N/A 09/08/2016   Procedure: FIREFLY INJECTIONS;  Surgeon: Cleon Gustin, MD;  Location: [facility];  Service: Urology;  Laterality: N/A;   CYSTOSCOPY  W/ RETROGRADES Bilateral 11/18/2021   Procedure: CYSTOSCOPY WITH RETROGRADE PYELOGRAM;  Surgeon: Cleon Gustin, MD;  Location: [facility];  Service: Urology;  Laterality: Bilateral;   INCISION AND DRAINAGE OF WOUND Right 1970's   "leg" (04/08/2013)   LIVER BIOPSY  ~ 2011   POLYPECTOMY     PROCTOSCOPY N/A 09/08/2016   Procedure: RIGID PROCTOSCOPY;  Surgeon: Michael Boston, MD;  Location: [facility];  Service: General;  Laterality: N/A;   TONSILLECTOMY     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   Procedure: TRANSURETHRAL RESECTION OF BLADDER TUMOR (TURBT);  Surgeon: Cleon Gustin, MD;  Location: [facility];  Service: Urology;  Laterality: N/A;    SH: Social History   Tobacco Use   Smoking status: Former    Packs/day: 0.25    Years: 45.00    Total pack years: 11.25    Types: Cigarettes    Quit date: 03/05/2021    Years since quitting: 0.9   Smokeless tobacco: Never  Vaping Use   Vaping Use: Never used  Substance Use Topics   Alcohol use: Yes    Alcohol/week: 12.0 standard drinks of alcohol    Types: 12 Cans of beer per week    Comment: 12 pack a week.   Drug use: No    ROS: All other review of systems were reviewed and are negative except what is noted above in HPI  PE: BP 132/72   Pulse 66  GENERAL APPEARANCE:  Well appearing, well developed, well nourished, NAD HEENT:  Atraumatic, normocephalic NECK:  Supple. Trachea midline Lungs: Unlabored respirations ABDOMEN:  Soft, non-tender, no masses  Results:   Urinalysis    Component Value Date/Time   COLORURINE RED (A) 10/28/2021 1253   APPEARANCEUR Clear 02/11/2022 0943   LABSPEC  10/28/2021 1253    TEST NOT REPORTED DUE TO COLOR INTERFERENCE OF URINE PIGMENT   PHURINE  10/28/2021 1253    TEST NOT REPORTED DUE TO COLOR INTERFERENCE OF URINE PIGMENT   GLUCOSEU Negative 02/11/2022 0943   HGBUR (A) 10/28/2021 1253    TEST NOT REPORTED DUE TO COLOR INTERFERENCE OF URINE PIGMENT   BILIRUBINUR Negative  02/11/2022 0943   KETONESUR (A) 10/28/2021 1253    TEST NOT REPORTED DUE TO COLOR INTERFERENCE OF URINE PIGMENT   PROTEINUR Trace (A) 02/11/2022 0943   PROTEINUR (A) 10/28/2021 1253    TEST NOT REPORTED DUE TO COLOR INTERFERENCE OF URINE PIGMENT   UROBILINOGEN 0.2 06/23/2016 1441   UROBILINOGEN 1.0 12/17/2013 0528   NITRITE Negative 02/11/2022 0943   NITRITE (A) 10/28/2021 1253    TEST NOT REPORTED DUE TO COLOR INTERFERENCE OF URINE PIGMENT   LEUKOCYTESUR Trace (A) 02/11/2022 0943   LEUKOCYTESUR (A) 10/28/2021 1253    TEST NOT REPORTED DUE TO COLOR INTERFERENCE OF URINE PIGMENT    Lab Results  Component Value Date   LABMICR See below: 02/11/2022   WBCUA 6-10 (A) 02/11/2022   LABEPIT 0-10 02/11/2022   MUCUS Present 02/11/2022   BACTERIA Few 02/11/2022    Pertinent Imaging: No results found for this or any previous visit.  No results found for this or any previous visit.  No results found for this or any previous visit.  No results found for this or any previous visit.  No results found for this or any previous visit.  No results found for this or any previous visit.  No results found for this or any previous visit.  Results for orders placed during the hospital encounter of 10/28/21  CT Renal Stone Study  Narrative CLINICAL DATA:  Nephrolithiasis, hematuria.  EXAM: CT ABDOMEN AND PELVIS WITHOUT CONTRAST  TECHNIQUE: Multidetector CT imaging of the abdomen and pelvis was performed following the standard protocol without IV contrast.  RADIATION DOSE REDUCTION: This exam was performed according to the departmental dose-optimization program which includes automated exposure control, adjustment of the mA and/or kV according to patient size and/or use of iterative reconstruction technique.  COMPARISON:  CT Dec 17, 2013 and January 14, 2016  FINDINGS: Lower chest: Bibasilar atelectasis versus scarring.  Hepatobiliary: No suspicious hepatic lesion on this  noncontrast examination. Cholelithiasis without findings of acute cholecystitis. No biliary ductal dilation.  Pancreas: No pancreatic ductal dilation or evidence of acute inflammation.  Spleen: No splenomegaly or focal splenic lesion.  Adrenals/Urinary Tract: Bilateral adrenal glands appear normal.  No hydronephrosis. No renal, ureteral or bladder calculi. Exophytic 1.2 cm left renal cyst.  Endophytic 2.3 cm soft tissue mass extending from the anterior urinary bladder wall on image 69/2.  Stomach/Bowel: No enteric contrast was administered. Stomach is unremarkable for degree of distension. Diverticulum from the third part of the duodenum. No pathologic dilation of small or large bowel. The appendix and terminal ileum appear normal. Colonic diverticulosis without findings of acute diverticulitis. No evidence of acute bowel inflammation.  Vascular/Lymphatic: Aortic atherosclerosis without aneurysmal dilation. No pathologically enlarged abdominal or pelvic lymph nodes.  Reproductive: Heterogeneous enlargement of the prostate gland. Calcifications of the  vas deferens and seminal vesicles.  Other: No significant abdominopelvic free fluid.  Musculoskeletal: Advanced multilevel degenerative changes spine. 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 No aggressive lytic or blastic lesion of bone.  IMPRESSION: 1. Endophytic 2.3 cm soft tissue mass extending from the anterior urinary bladder wall, suspicious for bladder neoplasm. Suggest urology consult in further evaluation with cystoscopy. 2. Heterogeneous enlargement of the prostate gland. 3. Colonic diverticulosis without findings of acute diverticulitis. 4. Cholelithiasis without findings of acute cholecystitis. 5.  Aortic Atherosclerosis (ICD10-I70.0).   Electronically Signed By: Dahlia Bailiff M.D. On: 10/28/2021 13:35  No results found for this or any previous visit  (from the past 24 hour(s)).

## 2022-02-21 ENCOUNTER — Other Ambulatory Visit: Payer: Self-pay

## 2022-02-21 ENCOUNTER — Telehealth: Payer: Self-pay | Admitting: Family Medicine

## 2022-02-21 ENCOUNTER — Encounter: Payer: Self-pay | Admitting: Physician Assistant

## 2022-02-21 DIAGNOSIS — E1165 Type 2 diabetes mellitus with hyperglycemia: Secondary | ICD-10-CM

## 2022-02-21 MED ORDER — METFORMIN HCL 500 MG PO TABS: 500.0000 mg | ORAL_TABLET | Freq: Two times a day (BID) | ORAL | 1 refills | Status: DC

## 2022-02-21 NOTE — Telephone Encounter (Signed)
Medication was sent into the pharmacy.  

## 2022-02-21 NOTE — Telephone Encounter (Signed)
[employer] called stating pt stating that pt want to increase his disp on his metformin 45 to 90 tablets. Pt changed his pharmacy to [facility] on [address]   I see all changes were had, just noting the call

## 2022-02-25 ENCOUNTER — Ambulatory Visit (INDEPENDENT_AMBULATORY_CARE_PROVIDER_SITE_OTHER): Payer: Medicare (Managed Care) | Admitting: Physician Assistant

## 2022-02-25 ENCOUNTER — Telehealth: Payer: Self-pay

## 2022-02-25 VITALS — BP 129/64 | HR 63

## 2022-02-25 DIAGNOSIS — R8271 Bacteriuria: Secondary | ICD-10-CM | POA: Diagnosis not present

## 2022-02-25 DIAGNOSIS — C679 Malignant neoplasm of bladder, unspecified: Secondary | ICD-10-CM

## 2022-02-25 LAB — MICROSCOPIC EXAMINATION
Epithelial Cells (non renal): NONE SEEN /hpf (ref 0–10)
Renal Epithel, UA: NONE SEEN /hpf
WBC, UA: >30 /hpf — AB (ref 0–5)

## 2022-02-25 LAB — URINALYSIS, ROUTINE W REFLEX MICROSCOPIC
Bilirubin, UA: NEGATIVE
Glucose, UA: NEGATIVE
Ketones, UA: NEGATIVE
Nitrite, UA: NEGATIVE
Specific Gravity, UA: 1.02 (ref 1.005–1.030)
Urobilinogen, Ur: 0.2 mg/dL (ref 0.2–1.0)
pH, UA: 5.5 (ref 5.0–7.5)

## 2022-02-25 MED ORDER — BCG LIVE 50 MG IS SUSR
3.2400 mL | Freq: Once | INTRAVESICAL | Status: AC
  Administered 2022-02-25: 81 mg via INTRAVESICAL

## 2022-02-25 NOTE — Telephone Encounter (Signed)
We received surgical clearance form via fax. Pt has not been seen since April and will need a new appointment for [place] to complete these forms. Please schedule in office

## 2022-02-25 NOTE — Progress Notes (Signed)
Assessment: 1. Malignant neoplasm of urinary bladder, unspecified site (HCC) - Urinalysis, Routine w reflex microscopic - bcg vaccine injection 81 mg - Insert,non-indwelling bladder catheter  2. Bacteria in urine - Urine Culture    Plan: FU next week for BCG tx 6/6. Discussed expectations post tx today and pt will let us know if he has concerns before next week. Urine cx ordered. No tx. Pt not expecting a call unless cx is positive.  Chief Complaint: No chief complaint on file.   HPI: Phillip Maynard. is a 81 y.o. male who presents for continued evaluation of bladder cancer. He is scheduled for BCG tx 5/6 today and has tolerated treatments well. No gross hematuria  UA=>30 WBC, few bacteria   Portions of the above documentation were copied from a prior visit for review purposes only.  Allergies: Allergies  Allergen Reactions   Penicillins Anaphylaxis and Other (See Comments)    Has patient had a PCN reaction causing immediate rash, facial/tongue/throat swelling, SOB or lightheadedness with hypotension: Yes Has patient had a PCN reaction causing severe rash involving mucus membranes or skin necrosis: No Has patient had a PCN reaction that required hospitalization No Has patient had a PCN reaction occurring within the last 10 years: No If all of the above answers are "NO", then may proceed with Cephalosporin use.   Shellfish Allergy Anaphylaxis    PMH: Past Medical History:  Diagnosis Date   Asthma    "when I was a boy" (04/08/2013)   Bronchitis    Colon polyps    Colovesical fistula    COPD (chronic obstructive pulmonary disease) (HCC)    Diverticulosis    GERD (gastroesophageal reflux disease)    Hepatitis C    treated with injections   High cholesterol    Hypertension    Type II diabetes mellitus (HCC)    hx of . No longer on medicine    PSH: Past Surgical History:  Procedure Laterality Date   COLONOSCOPY  last 09/06/2016   CYSTOSCOPY N/A 09/08/2016    Procedure: FIREFLY INJECTIONS;  Surgeon: Cleon Gustin, MD;  Location: [facility];  Service: Urology;  Laterality: N/A;   CYSTOSCOPY W/ RETROGRADES Bilateral 11/18/2021   Procedure: CYSTOSCOPY WITH RETROGRADE PYELOGRAM;  Surgeon: Cleon Gustin, MD;  Location: [facility];  Service: Urology;  Laterality: Bilateral;   INCISION AND DRAINAGE OF WOUND Right 1970's   "leg" (04/08/2013)   LIVER BIOPSY  ~ 2011   POLYPECTOMY     PROCTOSCOPY N/A 09/08/2016   Procedure: RIGID PROCTOSCOPY;  Surgeon: Michael Boston, MD;  Location: [facility];  Service: General;  Laterality: N/A;   TONSILLECTOMY     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   Procedure: TRANSURETHRAL RESECTION OF BLADDER TUMOR (TURBT);  Surgeon: Cleon Gustin, MD;  Location: [facility];  Service: Urology;  Laterality: N/A;    SH: Social History   Tobacco Use   Smoking status: Former    Packs/day: 0.25    Years: 45.00    Total pack years: 11.25    Types: Cigarettes    Quit date: 03/05/2021    Years since quitting: 0.9   Smokeless tobacco: Never  Vaping Use   Vaping Use: Never used  Substance Use Topics   Alcohol use: Yes    Alcohol/week: 12.0 standard drinks of alcohol    Types: 12 Cans of beer per week    Comment: 12 pack a week.   Drug use: No    ROS:  All other review of systems were reviewed and are negative except what is noted above in HPI  PE: There were no vitals taken for this visit. GENERAL APPEARANCE:  Well appearing, well developed, well nourished, NAD HEENT:  Atraumatic, normocephalic NECK:  Supple. Trachea midline Lungs: Unlabored respirations ABDOMEN:  Soft, non-tender, no masses  Results:   Urinalysis    Component Value Date/Time   COLORURINE RED (A) 10/28/2021 1253   APPEARANCEUR Clear 02/18/2022 0938   LABSPEC  10/28/2021 1253    TEST NOT REPORTED DUE TO COLOR INTERFERENCE OF URINE PIGMENT   PHURINE  10/28/2021 1253    TEST NOT REPORTED DUE TO COLOR INTERFERENCE OF URINE PIGMENT    GLUCOSEU Negative 02/18/2022 0938   HGBUR (A) 10/28/2021 1253    TEST NOT REPORTED DUE TO COLOR INTERFERENCE OF URINE PIGMENT   BILIRUBINUR Negative 02/18/2022 0938   KETONESUR (A) 10/28/2021 1253    TEST NOT REPORTED DUE TO COLOR INTERFERENCE OF URINE PIGMENT   PROTEINUR Negative 02/18/2022 0938   PROTEINUR (A) 10/28/2021 1253    TEST NOT REPORTED DUE TO COLOR INTERFERENCE OF URINE PIGMENT   UROBILINOGEN 0.2 06/23/2016 1441   UROBILINOGEN 1.0 12/17/2013 0528   NITRITE Negative 02/18/2022 0938   NITRITE (A) 10/28/2021 1253    TEST NOT REPORTED DUE TO COLOR INTERFERENCE OF URINE PIGMENT   LEUKOCYTESUR 1+ (A) 02/18/2022 0938   LEUKOCYTESUR (A) 10/28/2021 1253    TEST NOT REPORTED DUE TO COLOR INTERFERENCE OF URINE PIGMENT    Lab Results  Component Value Date   LABMICR See below: 02/18/2022   WBCUA >30 (A) 02/18/2022   LABEPIT 0-10 02/18/2022   MUCUS Present 02/11/2022   BACTERIA Few 02/18/2022    Pertinent Imaging: No results found for this or any previous visit.  No results found for this or any previous visit.  No results found for this or any previous visit.  No results found for this or any previous visit.  No results found for this or any previous visit.  No results found for this or any previous visit.  No results found for this or any previous visit.  Results for orders placed during the hospital encounter of 10/28/21  CT Renal Stone Study  Narrative CLINICAL DATA:  Nephrolithiasis, hematuria.  EXAM: CT ABDOMEN AND PELVIS WITHOUT CONTRAST  TECHNIQUE: Multidetector CT imaging of the abdomen and pelvis was performed following the standard protocol without IV contrast.  RADIATION DOSE REDUCTION: This exam was performed according to the departmental dose-optimization program which includes automated exposure control, adjustment of the mA and/or kV according to patient size and/or use of iterative reconstruction technique.  COMPARISON:  CT Dec 17, 2013 and  January 14, 2016  FINDINGS: Lower chest: Bibasilar atelectasis versus scarring.  Hepatobiliary: No suspicious hepatic lesion on this noncontrast examination. Cholelithiasis without findings of acute cholecystitis. No biliary ductal dilation.  Pancreas: No pancreatic ductal dilation or evidence of acute inflammation.  Spleen: No splenomegaly or focal splenic lesion.  Adrenals/Urinary Tract: Bilateral adrenal glands appear normal.  No hydronephrosis. No renal, ureteral or bladder calculi. Exophytic 1.2 cm left renal cyst.  Endophytic 2.3 cm soft tissue mass extending from the anterior urinary bladder wall on image 69/2.  Stomach/Bowel: No enteric contrast was administered. Stomach is unremarkable for degree of distension. Diverticulum from the third part of the duodenum. No pathologic dilation of small or large bowel. The appendix and terminal ileum appear normal. Colonic diverticulosis without findings of acute diverticulitis. No evidence of acute bowel inflammation.  Vascular/Lymphatic: Aortic atherosclerosis without aneurysmal dilation. No pathologically enlarged abdominal or pelvic lymph nodes.  Reproductive: Heterogeneous enlargement of the prostate gland. Calcifications of the vas deferens and seminal vesicles.  Other: No significant abdominopelvic free fluid.  Musculoskeletal: Advanced multilevel degenerative changes spine. 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 No aggressive lytic or blastic lesion of bone.  IMPRESSION: 1. Endophytic 2.3 cm soft tissue mass extending from the anterior urinary bladder wall, suspicious for bladder neoplasm. Suggest urology consult in further evaluation with cystoscopy. 2. Heterogeneous enlargement of the prostate gland. 3. Colonic diverticulosis without findings of acute diverticulitis. 4. Cholelithiasis without findings of acute cholecystitis. 5.  Aortic Atherosclerosis  (ICD10-I70.0).   Electronically Signed By: Dahlia Bailiff M.D. On: 10/28/2021 13:35  No results found for this or any previous visit (from the past 24 hour(s)).

## 2022-02-25 NOTE — Progress Notes (Signed)
BCG Bladder Instillation  BCG # 5 of 6  Due to Bladder Cancer patient is present today for a BCG treatment. Patient was cleaned and prepped in a sterile fashion with betadine. A 14FR catheter was inserted, urine return was noted 215m, urine was yellow in color.  54mof reconstituted BCG was instilled into the bladder. The catheter was then removed. Patient tolerated well, no complications were noted  Performed by: ShMarisue BrooklynCMA  Follow up/ Additional notes: Keep next schedule nurse visit

## 2022-02-27 LAB — URINE CULTURE

## 2022-03-01 NOTE — Telephone Encounter (Signed)
Called Pt twice no answer, Pt doesn't have a voice mail box set up.

## 2022-03-04 ENCOUNTER — Ambulatory Visit: Payer: Medicare (Managed Care) | Admitting: Urology

## 2022-03-04 ENCOUNTER — Ambulatory Visit: Payer: Medicare (Managed Care)

## 2022-03-04 DIAGNOSIS — H2181 Floppy iris syndrome: Secondary | ICD-10-CM | POA: Diagnosis not present

## 2022-03-04 DIAGNOSIS — H25812 Combined forms of age-related cataract, left eye: Secondary | ICD-10-CM | POA: Diagnosis not present

## 2022-03-07 ENCOUNTER — Ambulatory Visit: Payer: Medicare (Managed Care) | Admitting: Physician Assistant

## 2022-03-07 ENCOUNTER — Ambulatory Visit (INDEPENDENT_AMBULATORY_CARE_PROVIDER_SITE_OTHER): Payer: Medicare (Managed Care) | Admitting: Physician Assistant

## 2022-03-07 DIAGNOSIS — C679 Malignant neoplasm of bladder, unspecified: Secondary | ICD-10-CM

## 2022-03-07 LAB — URINALYSIS, ROUTINE W REFLEX MICROSCOPIC
Bilirubin, UA: NEGATIVE
Glucose, UA: NEGATIVE
Ketones, UA: NEGATIVE
Nitrite, UA: NEGATIVE
Specific Gravity, UA: 1.025 (ref 1.005–1.030)
Urobilinogen, Ur: 0.2 mg/dL (ref 0.2–1.0)
pH, UA: 5 (ref 5.0–7.5)

## 2022-03-07 LAB — MICROSCOPIC EXAMINATION
Renal Epithel, UA: NONE SEEN /hpf
WBC, UA: >30 /hpf — AB (ref 0–5)

## 2022-03-07 MED ORDER — BCG LIVE 50 MG IS SUSR
3.2400 mL | Freq: Once | INTRAVESICAL | Status: AC
  Administered 2022-03-07: 81 mg via INTRAVESICAL

## 2022-03-07 NOTE — Progress Notes (Signed)
BCG Bladder Instillation  BCG # 6 of 6  Due to Bladder Cancer patient is present today for a BCG treatment. Patient was cleaned and prepped in a sterile fashion with betadine. A 14FR catheter was inserted, urine return was noted 10 ml, urine was yellow in color.  24m of reconstituted BCG was instilled into the bladder. The catheter was then removed. Patient tolerated well, no complications were noted  Performed by: KLevi Aland CMA, assisted [email or web address] Follow up/ Additional notes: Followed up as schedule

## 2022-03-07 NOTE — Patient Instructions (Signed)

## 2022-03-12 ENCOUNTER — Encounter (HOSPITAL_COMMUNITY): Payer: Self-pay | Admitting: Emergency Medicine

## 2022-03-12 ENCOUNTER — Emergency Department (HOSPITAL_COMMUNITY)
Admission: EM | Admit: 2022-03-12 | Discharge: 2022-03-12 | Disposition: A | Discharge status: Home / Self Care | Payer: Medicare (Managed Care) | Attending: Emergency Medicine | Admitting: Emergency Medicine

## 2022-03-12 ENCOUNTER — Other Ambulatory Visit: Payer: Self-pay

## 2022-03-12 ENCOUNTER — Emergency Department (HOSPITAL_COMMUNITY): Payer: Medicare (Managed Care)

## 2022-03-12 DIAGNOSIS — E119 Type 2 diabetes mellitus without complications: Secondary | ICD-10-CM | POA: Diagnosis not present

## 2022-03-12 DIAGNOSIS — Z79899 Other long term (current) drug therapy: Secondary | ICD-10-CM | POA: Diagnosis not present

## 2022-03-12 DIAGNOSIS — J449 Chronic obstructive pulmonary disease, unspecified: Secondary | ICD-10-CM | POA: Insufficient documentation

## 2022-03-12 DIAGNOSIS — N3001 Acute cystitis with hematuria: Secondary | ICD-10-CM | POA: Insufficient documentation

## 2022-03-12 DIAGNOSIS — R059 Cough, unspecified: Secondary | ICD-10-CM | POA: Diagnosis not present

## 2022-03-12 DIAGNOSIS — I10 Essential (primary) hypertension: Secondary | ICD-10-CM | POA: Diagnosis not present

## 2022-03-12 DIAGNOSIS — Z8551 Personal history of malignant neoplasm of bladder: Secondary | ICD-10-CM | POA: Diagnosis not present

## 2022-03-12 DIAGNOSIS — R300 Dysuria: Secondary | ICD-10-CM | POA: Diagnosis present

## 2022-03-12 DIAGNOSIS — Z7984 Long term (current) use of oral hypoglycemic drugs: Secondary | ICD-10-CM | POA: Insufficient documentation

## 2022-03-12 DIAGNOSIS — Z7951 Long term (current) use of inhaled steroids: Secondary | ICD-10-CM | POA: Insufficient documentation

## 2022-03-12 DIAGNOSIS — R051 Acute cough: Secondary | ICD-10-CM | POA: Diagnosis not present

## 2022-03-12 LAB — URINALYSIS, ROUTINE W REFLEX MICROSCOPIC
Bilirubin Urine: NEGATIVE
Glucose, UA: NEGATIVE mg/dL
Ketones, ur: NEGATIVE mg/dL
Nitrite: NEGATIVE
Protein, ur: 100 mg/dL — AB
RBC / HPF: >50 RBC/hpf — ABNORMAL HIGH (ref 0–5)
Specific Gravity, Urine: 1.021 (ref 1.005–1.030)
WBC, UA: >50 WBC/hpf — ABNORMAL HIGH (ref 0–5)
pH: 5 (ref 5.0–8.0)

## 2022-03-12 LAB — CBC WITH DIFFERENTIAL/PLATELET
Abs Immature Granulocytes: 0.02 10*3/uL (ref 0.00–0.07)
Basophils Absolute: 0.1 10*3/uL (ref 0.0–0.1)
Basophils Relative: 1 %
Eosinophils Absolute: 1.4 10*3/uL — ABNORMAL HIGH (ref 0.0–0.5)
Eosinophils Relative: 15 %
HCT: 43.8 % (ref 39.0–52.0)
Hemoglobin: 14.3 g/dL (ref 13.0–17.0)
Immature Granulocytes: 0 %
Lymphocytes Relative: 33 %
Lymphs Abs: 3.1 10*3/uL (ref 0.7–4.0)
MCH: 30.1 pg (ref 26.0–34.0)
MCHC: 32.6 g/dL (ref 30.0–36.0)
MCV: 92.2 fL (ref 80.0–100.0)
Monocytes Absolute: 0.7 10*3/uL (ref 0.1–1.0)
Monocytes Relative: 8 %
Neutro Abs: 3.9 10*3/uL (ref 1.7–7.7)
Neutrophils Relative %: 43 %
Platelets: 354 10*3/uL (ref 150–400)
RBC: 4.75 MIL/uL (ref 4.22–5.81)
RDW: 13.4 % (ref 11.5–15.5)
WBC: 9.1 10*3/uL (ref 4.0–10.5)
nRBC: 0 % (ref 0.0–0.2)

## 2022-03-12 LAB — COMPREHENSIVE METABOLIC PANEL
ALT: 16 U/L (ref 0–44)
AST: 17 U/L (ref 15–41)
Albumin: 4 g/dL (ref 3.5–5.0)
Alkaline Phosphatase: 51 U/L (ref 38–126)
Anion gap: 7 (ref 5–15)
BUN: 21 mg/dL (ref 8–23)
CO2: 29 mmol/L (ref 22–32)
Calcium: 9.7 mg/dL (ref 8.9–10.3)
Chloride: 101 mmol/L (ref 98–111)
Creatinine, Ser: 1.48 mg/dL — ABNORMAL HIGH (ref 0.61–1.24)
GFR, Estimated: 48 mL/min — ABNORMAL LOW (ref >60)
Glucose, Bld: 112 mg/dL — ABNORMAL HIGH (ref 70–99)
Potassium: 4.1 mmol/L (ref 3.5–5.1)
Sodium: 137 mmol/L (ref 135–145)
Total Bilirubin: 0.6 mg/dL (ref 0.3–1.2)
Total Protein: 8.6 g/dL — ABNORMAL HIGH (ref 6.5–8.1)

## 2022-03-12 MED ORDER — CIPROFLOXACIN HCL 500 MG PO TABS: 500.0000 mg | ORAL_TABLET | Freq: Two times a day (BID) | ORAL | 0 refills | Status: DC

## 2022-03-12 MED ORDER — LACTATED RINGERS IV BOLUS
1000.0000 mL | Freq: Once | INTRAVENOUS | Status: AC
  Administered 2022-03-12: 1000 mL via INTRAVENOUS

## 2022-03-12 NOTE — ED Notes (Signed)
Bladder scan 45 ml

## 2022-03-12 NOTE — Discharge Instructions (Addendum)
You were seen in the emergency department for evaluation of your pain with urination as well as trouble starting urinary stream.  I spoke with the urologist, Dr. Louis Meckel who is affiliated with your practice and he wants to start you on Cipro which is a antibiotic that you will take twice daily for the next 2 weeks.  He would like for you to follow-up with a urologist office on Monday for reevaluation.  For your cough, your chest x-ray was normal.  I like for you to follow-up with your primary care doctor about this.  I suggest not sleeping in front of the fan as this may be causing some drainage into the back your throat causing a cough.  Your lung sounds were great.  I have included information on urinary tract infections.  Additionally, have included additional information on ciprofloxacin that she should read.  Please make sure you read this information.  If you have any concern, new or worsening symptoms, please return the nearest emergency department for reevaluation.  Contact a health care provider if: Your symptoms do not get better after 1-2 days. Your symptoms go away and then return. Get help right away if: You have severe pain in your back or your lower abdomen. You have a fever or chills. You have nausea or vomiting.

## 2022-03-12 NOTE — ED Provider Notes (Signed)
[facility] DEPT Provider Note   CSN: [id number] Arrival date & time: 03/12/22  1252     History Chief Complaint  Patient presents with   Dysuria   Cough    Phillip Maynard. is a 81 y.o. male.   Dysuria Presenting symptoms: dysuria   Cough      Home Medications Prior to Admission medications   Medication Sig Start Date End Date Taking? Authorizing Provider  albuterol (VENTOLIN HFA) 108 (90 Base) MCG/ACT inhaler TAKE 2 PUFFS BY MOUTH EVERY 6 HOURS AS NEEDED FOR WHEEZE OR SHORTNESS OF BREATH 12/09/21   Wendie Agreste, MD  atorvastatin (LIPITOR) 10 MG tablet TAKE 1 TABLET EVERY DAY 07/26/21   Wendie Agreste, MD  blood glucose meter kit and supplies Dispense based on patient and insurance preference. Use up to four times daily as directed. (FOR ICD-10 E10.9, E11.9). 09/29/21   Wendie Agreste, MD  finasteride (PROSCAR) 5 MG tablet Take 1 tablet (5 mg total) by mouth daily. 01/13/21   McKenzie, Candee Furbish, MD  Fluticasone-Umeclidin-Vilant (TRELEGY ELLIPTA) 100-62.5-25 MCG/ACT AEPB Inhale 1 puff into the lungs daily. 01/04/22   Freddi Starr, MD  losartan-hydrochlorothiazide (HYZAAR) 50-12.5 MG tablet Take 1 tablet by mouth daily. 09/29/21   Wendie Agreste, MD  metFORMIN (GLUCOPHAGE) 500 MG tablet Take 1 tablet (500 mg total) by mouth 2 (two) times daily with a meal. 02/21/22   Wendie Agreste, MD  oxyCODONE-acetaminophen (PERCOCET) 5-325 MG tablet Take 1 tablet by mouth every 4 (four) hours as needed for severe pain. 11/18/21 11/18/22  Cleon Gustin, MD  tamsulosin (FLOMAX) 0.4 MG CAPS capsule Take 2 capsules (0.8 mg total) by mouth at bedtime. 01/13/21   McKenzie, Candee Furbish, MD      Allergies    Penicillins and Shellfish allergy    Review of Systems   Review of Systems  Respiratory:  Positive for cough.   Genitourinary:  Positive for dysuria.    Physical Exam Updated Vital Signs BP 125/68 (BP Location: Right Arm)   Pulse 65    Temp 98.4 F (36.9 C) (Oral)   Resp 18   SpO2 100%  Physical Exam  ED Results / Procedures / Treatments   Labs (all labs ordered are listed, but only abnormal results are displayed) Labs Reviewed  URINALYSIS, ROUTINE W REFLEX MICROSCOPIC - Abnormal; Notable for the following components:      Result Value   Color, Urine AMBER (*)    APPearance CLOUDY (*)    Hgb urine dipstick LARGE (*)    Protein, ur 100 (*)    Leukocytes,Ua LARGE (*)    RBC / HPF >50 (*)    WBC, UA >50 (*)    Bacteria, UA MANY (*)    All other components within normal limits  CBC WITH DIFFERENTIAL/PLATELET - Abnormal; Notable for the following components:   Eosinophils Absolute 1.4 (*)    All other components within normal limits  COMPREHENSIVE METABOLIC PANEL - Abnormal; Notable for the following components:   Glucose, Bld 112 (*)    Creatinine, Ser 1.48 (*)    Total Protein 8.6 (*)    GFR, Estimated 48 (*)    All other components within normal limits    EKG None  Radiology DG Chest 2 View  Result Date: 03/12/2022 CLINICAL DATA:  Cough. Undergoing chemotherapy for bladder carcinoma. Dysuria. EXAM: CHEST - 2 VIEW COMPARISON:  09/17/2021 FINDINGS: The heart size and mediastinal contours are within normal  limits. Both lungs are clear. The visualized skeletal structures are unremarkable. IMPRESSION: No active cardiopulmonary disease. Electronically Signed   By: Marlaine Hind M.D.   On: 03/12/2022 14:36    Procedures Procedures   Medications Ordered in ED Medications  lactated ringers bolus 1,000 mL (1,000 mLs Intravenous New Bag/Given 03/12/22 1721)    ED Course/ Medical Decision Making/ A&P                           Medical Decision Making Amount and/or Complexity of Data Reviewed Labs: ordered. Radiology: ordered.   ***  Discussed with the patient's urology group and spoke with Dr. Louis Meckel.  Discussed with him the urology symptoms.  He would like for me to place the patient on Cipro for 2  weeks.  Follow-up with urology.  I discussed the patient's labs and imaging with the patient and wife at bedside.  We discussed the new antibiotic and for follow-up for urology as soon as possible.  I discussed to follow-up with her primary care doctor for a cough given his normal chest x-ray.  We discussed return precautions and red flag symptoms.  Patient verbalized understanding agrees to the plan.  Patient is stable be discharged home in good condition.  {Document critical care time when appropriate:1} {Document review of labs and clinical decision tools ie heart score, Chads2Vasc2 etc:1}  {Document your independent review of radiology images, and any outside records:1} {Document your discussion with family members, caretakers, and with consultants:1} {Document social determinants of health affecting pt's care:1} {Document your decision making why or why not admission, treatments were needed:1} Final Clinical Impression(s) / ED Diagnoses Final diagnoses:  Acute cystitis with hematuria  Acute cough    Rx / DC Orders ED Discharge Orders     None

## 2022-03-12 NOTE — ED Provider Triage Note (Signed)
Emergency Medicine Provider Triage Evaluation Note  Phillip Maynard. , a 81 y.o. male  was evaluated in triage.  Pt complains of trouble initiating urination as well as dysuria. The patient has bladder cancer and receives treatments via a catheter once weekly. The catheter is placed and removed same day. Last treatment was Monday and the patient has been having symptoms since. Denies any fever, nausea, vomiting, or abdominal pain. The patient mentions he has also had a cough.  Review of Systems  Positive:  Negative:   Physical Exam  BP 135/66 (BP Location: Left Arm)   Pulse 65   Temp 98.5 F (36.9 C) (Oral)   Resp 18   SpO2 100%  Gen:   Awake, no distress   Resp:  Normal effort  MSK:   Moves extremities without difficulty  Other:    Medical Decision Making  Medically screening exam initiated at 2:17 PM.  Appropriate orders placed.  Phillip Maynard. was informed that the remainder of the evaluation will be completed by another provider, this initial triage assessment does not replace that evaluation, and the importance of remaining in the ED until their evaluation is complete.     Sherrell Puller, PA-C 03/12/22 1419

## 2022-03-12 NOTE — ED Triage Notes (Signed)
Pt reports catheter being placed And ever since it was removed on Monday, he has been having difficulty urinating, painful urination, a cough, and chills. Pt is concerned for infection and would like to get checked for that.

## 2022-03-14 ENCOUNTER — Ambulatory Visit (INDEPENDENT_AMBULATORY_CARE_PROVIDER_SITE_OTHER): Payer: Medicare (Managed Care) | Admitting: Urology

## 2022-03-14 ENCOUNTER — Encounter: Payer: Self-pay | Admitting: Urology

## 2022-03-14 VITALS — BP 150/65 | HR 81

## 2022-03-14 DIAGNOSIS — N401 Enlarged prostate with lower urinary tract symptoms: Secondary | ICD-10-CM

## 2022-03-14 DIAGNOSIS — N3001 Acute cystitis with hematuria: Secondary | ICD-10-CM

## 2022-03-14 DIAGNOSIS — R339 Retention of urine, unspecified: Secondary | ICD-10-CM

## 2022-03-14 LAB — MICROSCOPIC EXAMINATION
Bacteria, UA: NONE SEEN
Epithelial Cells (non renal): NONE SEEN /hpf (ref 0–10)
Renal Epithel, UA: NONE SEEN /hpf
WBC, UA: >30 /hpf — AB (ref 0–5)

## 2022-03-14 LAB — URINALYSIS, ROUTINE W REFLEX MICROSCOPIC
Bilirubin, UA: NEGATIVE
Glucose, UA: NEGATIVE
Ketones, UA: NEGATIVE
Nitrite, UA: NEGATIVE
Specific Gravity, UA: 1.025 (ref 1.005–1.030)
Urobilinogen, Ur: 0.2 mg/dL (ref 0.2–1.0)
pH, UA: 5 (ref 5.0–7.5)

## 2022-03-14 MED ORDER — TAMSULOSIN HCL 0.4 MG PO CAPS: 0.8000 mg | ORAL_CAPSULE | Freq: Every day | ORAL | 11 refills | Status: DC

## 2022-03-14 MED ORDER — NITROFURANTOIN MONOHYD MACRO 100 MG PO CAPS: 100.0000 mg | ORAL_CAPSULE | Freq: Two times a day (BID) | ORAL | 0 refills | Status: DC

## 2022-03-14 MED ORDER — FINASTERIDE 5 MG PO TABS: 5.0000 mg | ORAL_TABLET | Freq: Every day | ORAL | 3 refills | Status: DC

## 2022-03-14 NOTE — Patient Instructions (Signed)
Urinary Tract Infection, Adult  A urinary tract infection (UTI) is an infection of any part of the urinary tract. The urinary tract includes the kidneys, ureters, bladder, and urethra. These organs make, store, and get rid of urine in the body. An upper UTI affects the ureters and kidneys. A lower UTI affects the bladder and urethra. What are the causes? Most urinary tract infections are caused by bacteria in your genital area around your urethra, where urine leaves your body. These bacteria grow and cause inflammation of your urinary tract. What increases the risk? You are more likely to develop this condition if: You have a urinary catheter that stays in place. You are not able to control when you urinate or have a bowel movement (incontinence). You are male and you: Use a spermicide or diaphragm for birth control. Have low estrogen levels. Are pregnant. You have certain genes that increase your risk. You are sexually active. You take antibiotic medicines. You have a condition that causes your flow of urine to slow down, such as: An enlarged prostate, if you are male. Blockage in your urethra. A kidney stone. A nerve condition that affects your bladder control (neurogenic bladder). Not getting enough to drink, or not urinating often. You have certain medical conditions, such as: Diabetes. A weak disease-fighting system (immunesystem). Sickle cell disease. Gout. Spinal cord injury. What are the signs or symptoms? Symptoms of this condition include: Needing to urinate right away (urgency). Frequent urination. This may include small amounts of urine each time you urinate. Pain or burning with urination. Blood in the urine. Urine that smells bad or unusual. Trouble urinating. Cloudy urine. Vaginal discharge, if you are male. Pain in the abdomen or the lower back. You may also have: Vomiting or a decreased appetite. Confusion. Irritability or tiredness. A fever or  chills. Diarrhea. The first symptom in older adults may be confusion. In some cases, they may not have any symptoms until the infection has worsened. How is this diagnosed? This condition is diagnosed based on your medical history and a physical exam. You may also have other tests, including: Urine tests. Blood tests. Tests for STIs (sexually transmitted infections). If you have had more than one UTI, a cystoscopy or imaging studies may be done to determine the cause of the infections. How is this treated? Treatment for this condition includes: Antibiotic medicine. Over-the-counter medicines to treat discomfort. Drinking enough water to stay hydrated. If you have frequent infections or have other conditions such as a kidney stone, you may need to see a health care provider who specializes in the urinary tract (urologist). In rare cases, urinary tract infections can cause sepsis. Sepsis is a life-threatening condition that occurs when the body responds to an infection. Sepsis is treated in the hospital with IV antibiotics, fluids, and other medicines. Follow these instructions at home:  Medicines Take over-the-counter and prescription medicines only as told by your health care provider. If you were prescribed an antibiotic medicine, take it as told by your health care provider. Do not stop using the antibiotic even if you start to feel better. General instructions Make sure you: Empty your bladder often and completely. Do not hold urine for long periods of time. Empty your bladder after sex. Wipe from front to back after urinating or having a bowel movement if you are male. Use each tissue only one time when you wipe. Drink enough fluid to keep your urine pale yellow. Keep all follow-up visits. This is important. Contact a health   care provider if: Your symptoms do not get better after 1-2 days. Your symptoms go away and then return. Get help right away if: You have severe pain in  your back or your lower abdomen. You have a fever or chills. You have nausea or vomiting. Summary A urinary tract infection (UTI) is an infection of any part of the urinary tract, which includes the kidneys, ureters, bladder, and urethra. Most urinary tract infections are caused by bacteria in your genital area. Treatment for this condition often includes antibiotic medicines. If you were prescribed an antibiotic medicine, take it as told by your health care provider. Do not stop using the antibiotic even if you start to feel better. Keep all follow-up visits. This is important. This information is not intended to replace advice given to you by your health care provider. Make sure you discuss any questions you have with your health care provider. Document Revised: 03/13/2020 Document Reviewed: 03/13/2020 Elsevier Patient Education  2023 [employer]  

## 2022-03-14 NOTE — Progress Notes (Signed)
03/14/2022 3:23 PM   Phillip Maynard. 08-11-1941 [id number]  Referring provider: Wendie Agreste, MD [address] [place],  [place] [address]  Followup UTI   HPI: [name] is a 81yo here for evaluation of UTI and difficulty urinating. He was seen in the ER Saturday for UTI and started on cipro. He notes no improvement in his LUTS and dysuria. UA remains concerning for infection. Urine culture not sent in ER. IPSS 25 QOl 5. He ran out of flomax last week.    PMH: Past Medical History:  Diagnosis Date   Asthma    "when I was a boy" (04/08/2013)   Bronchitis    Colon polyps    Colovesical fistula    COPD (chronic obstructive pulmonary disease) (HCC)    Diverticulosis    GERD (gastroesophageal reflux disease)    Hepatitis C    treated with injections   High cholesterol    Hypertension    Type II diabetes mellitus (HCC)    hx of . No longer on medicine    Surgical History: Past Surgical History:  Procedure Laterality Date   COLONOSCOPY  last 09/06/2016   CYSTOSCOPY N/A 09/08/2016   Procedure: FIREFLY INJECTIONS;  Surgeon: Cleon Gustin, MD;  Location: [facility];  Service: Urology;  Laterality: N/A;   CYSTOSCOPY W/ RETROGRADES Bilateral 11/18/2021   Procedure: CYSTOSCOPY WITH RETROGRADE PYELOGRAM;  Surgeon: Cleon Gustin, MD;  Location: [facility];  Service: Urology;  Laterality: Bilateral;   INCISION AND DRAINAGE OF WOUND Right 1970's   "leg" (04/08/2013)   LIVER BIOPSY  ~ 2011   POLYPECTOMY     PROCTOSCOPY N/A 09/08/2016   Procedure: RIGID PROCTOSCOPY;  Surgeon: Michael Boston, MD;  Location: [facility];  Service: General;  Laterality: N/A;   TONSILLECTOMY     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   Procedure: TRANSURETHRAL RESECTION OF BLADDER TUMOR (TURBT);  Surgeon: Cleon Gustin, MD;  Location: [facility];  Service: Urology;  Laterality: N/A;    Home Medications:  Allergies as of 03/14/2022       Reactions   Penicillins Anaphylaxis,  Other (See Comments)   Has patient had a PCN reaction causing immediate rash, facial/tongue/throat swelling, SOB or lightheadedness with hypotension: Yes Has patient had a PCN reaction causing severe rash involving mucus membranes or skin necrosis: No Has patient had a PCN reaction that required hospitalization No Has patient had a PCN reaction occurring within the last 10 years: No If all of the above answers are "NO", then may proceed with Cephalosporin use.   Shellfish Allergy Anaphylaxis        Medication List        Accurate as of March 14, 2022  3:23 PM. If you have any questions, ask your nurse or doctor.          albuterol 108 (90 Base) MCG/ACT inhaler Commonly known as: VENTOLIN HFA TAKE 2 PUFFS BY MOUTH EVERY 6 HOURS AS NEEDED FOR WHEEZE OR SHORTNESS OF BREATH   atorvastatin 10 MG tablet Commonly known as: LIPITOR TAKE 1 TABLET EVERY DAY   blood glucose meter kit and supplies Dispense based on patient and insurance preference. Use up to four times daily as directed. (FOR ICD-10 E10.9, E11.9).   ciprofloxacin 500 MG tablet Commonly known as: CIPRO Take 1 tablet (500 mg total) by mouth every 12 (twelve) hours.   finasteride 5 MG tablet Commonly known as: PROSCAR Take 1 tablet (5 mg total)  by mouth daily.   losartan-hydrochlorothiazide 50-12.5 MG tablet Commonly known as: HYZAAR Take 1 tablet by mouth daily.   metFORMIN 500 MG tablet Commonly known as: GLUCOPHAGE Take 1 tablet (500 mg total) by mouth 2 (two) times daily with a meal.   nitrofurantoin (macrocrystal-monohydrate) 100 MG capsule Commonly known as: MACROBID Take 1 capsule (100 mg total) by mouth every 12 (twelve) hours.   oxyCODONE-acetaminophen 5-325 MG tablet Commonly known as: Percocet Take 1 tablet by mouth every 4 (four) hours as needed for severe pain.   tamsulosin 0.4 MG Caps capsule Commonly known as: FLOMAX Take 2 capsules (0.8 mg total) by mouth at bedtime.   Trelegy Ellipta  100-62.5-25 MCG/ACT Aepb Generic drug: Fluticasone-Umeclidin-Vilant Inhale 1 puff into the lungs daily.        Allergies:  Allergies  Allergen Reactions   Penicillins Anaphylaxis and Other (See Comments)    Has patient had a PCN reaction causing immediate rash, facial/tongue/throat swelling, SOB or lightheadedness with hypotension: Yes Has patient had a PCN reaction causing severe rash involving mucus membranes or skin necrosis: No Has patient had a PCN reaction that required hospitalization No Has patient had a PCN reaction occurring within the last 10 years: No If all of the above answers are "NO", then may proceed with Cephalosporin use.   Shellfish Allergy Anaphylaxis    Family History: Family History  Problem Relation Age of Onset   Asthma Mother    Heart attack Mother    Colon polyps Neg Hx    Colon cancer Neg Hx    Esophageal cancer Neg Hx    Rectal cancer Neg Hx    Stomach cancer Neg Hx     Social History:  reports that he quit smoking about a year ago. His smoking use included cigarettes. He has a 11.25 pack-year smoking history. He has never used smokeless tobacco. He reports current alcohol use of about 12.0 standard drinks of alcohol per week. He reports that he does not use drugs.  ROS: All other review of systems were reviewed and are negative except what is noted above in HPI  Physical Exam: BP (!) 150/65   Pulse 81   Constitutional:  Alert and oriented, No acute distress. HEENT: [place] AT, moist mucus membranes.  Trachea midline, no masses. Cardiovascular: No clubbing, cyanosis, or edema. Respiratory: Normal respiratory effort, no increased work of breathing. GI: Abdomen is soft, nontender, nondistended, no abdominal masses GU: No CVA tenderness.  Lymph: No cervical or inguinal lymphadenopathy. Skin: No rashes, bruises or suspicious lesions. Neurologic: Grossly intact, no focal deficits, moving all 4 extremities. Psychiatric: Normal mood and  affect.  Laboratory Data: Lab Results  Component Value Date   WBC 9.1 03/12/2022   HGB 14.3 03/12/2022   HCT 43.8 03/12/2022   MCV 92.2 03/12/2022   PLT 354 03/12/2022    Lab Results  Component Value Date   CREATININE 1.48 (H) 03/12/2022    Lab Results  Component Value Date   PSA 0.9 05/23/2016   PSA 3.16 09/23/2015    No results found for: "TESTOSTERONE"  Lab Results  Component Value Date   HGBA1C 7.5 (H) 12/16/2021    Urinalysis    Component Value Date/Time   COLORURINE AMBER (A) 03/12/2022 1416   APPEARANCEUR CLOUDY (A) 03/12/2022 1416   APPEARANCEUR Clear 03/07/2022 0903   LABSPEC 1.021 03/12/2022 1416   PHURINE 5.0 03/12/2022 1416   GLUCOSEU NEGATIVE 03/12/2022 1416   [place]ler (A) 03/12/2022 1416   BILIRUBINUR NEGATIVE 03/12/2022  Taliaferro Negative 03/07/2022 0903   KETONESUR NEGATIVE 03/12/2022 1416   PROTEINUR 100 (A) 03/12/2022 1416   UROBILINOGEN 0.2 06/23/2016 1441   UROBILINOGEN 1.0 12/17/2013 0528   NITRITE NEGATIVE 03/12/2022 1416   LEUKOCYTESUR LARGE (A) 03/12/2022 1416    Lab Results  Component Value Date   LABMICR See below: 03/07/2022   WBCUA >30 (A) 03/07/2022   LABEPIT 0-10 03/07/2022   MUCUS Present 02/25/2022   BACTERIA MANY (A) 03/12/2022    Pertinent Imaging:  No results found for this or any previous visit.  No results found for this or any previous visit.  No results found for this or any previous visit.  No results found for this or any previous visit.  No results found for this or any previous visit.  No results found for this or any previous visit.  No results found for this or any previous visit.  Results for orders placed during the hospital encounter of 10/28/21  CT Renal Stone Study  Narrative CLINICAL DATA:  Nephrolithiasis, hematuria.  EXAM: CT ABDOMEN AND PELVIS WITHOUT CONTRAST  TECHNIQUE: Multidetector CT imaging of the abdomen and pelvis was performed following the standard  protocol without IV contrast.  RADIATION DOSE REDUCTION: This exam was performed according to the departmental dose-optimization program which includes automated exposure control, adjustment of the mA and/or kV according to patient size and/or use of iterative reconstruction technique.  COMPARISON:  CT Dec 17, 2013 and January 14, 2016  FINDINGS: Lower chest: Bibasilar atelectasis versus scarring.  Hepatobiliary: No suspicious hepatic lesion on this noncontrast examination. Cholelithiasis without findings of acute cholecystitis. No biliary ductal dilation.  Pancreas: No pancreatic ductal dilation or evidence of acute inflammation.  Spleen: No splenomegaly or focal splenic lesion.  Adrenals/Urinary Tract: Bilateral adrenal glands appear normal.  No hydronephrosis. No renal, ureteral or bladder calculi. Exophytic 1.2 cm left renal cyst.  Endophytic 2.3 cm soft tissue mass extending from the anterior urinary bladder wall on image 69/2.  Stomach/Bowel: No enteric contrast was administered. Stomach is unremarkable for degree of distension. Diverticulum from the third part of the duodenum. No pathologic dilation of small or large bowel. The appendix and terminal ileum appear normal. Colonic diverticulosis without findings of acute diverticulitis. No evidence of acute bowel inflammation.  Vascular/Lymphatic: Aortic atherosclerosis without aneurysmal dilation. No pathologically enlarged abdominal or pelvic lymph nodes.  Reproductive: Heterogeneous enlargement of the prostate gland. Calcifications of the vas deferens and seminal vesicles.  Other: No significant abdominopelvic free fluid.  Musculoskeletal: Advanced multilevel degenerative changes spine. 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 No aggressive lytic or blastic lesion of bone.  IMPRESSION: 1. Endophytic 2.3 cm soft tissue mass extending from the anterior urinary  bladder wall, suspicious for bladder neoplasm. Suggest urology consult in further evaluation with cystoscopy. 2. Heterogeneous enlargement of the prostate gland. 3. Colonic diverticulosis without findings of acute diverticulitis. 4. Cholelithiasis without findings of acute cholecystitis. 5.  Aortic Atherosclerosis (ICD10-I70.0).   Electronically Signed By: Dahlia Bailiff M.D. On: 10/28/2021 13:35   Assessment & Plan:    1. Acute cystitis with hematuria -urine for culture -macrobid 147m bid for 7 days - Urinalysis, Routine w reflex microscopic - Urine Culture  2. Benign localized prostatic hyperplasia with lower urinary tract symptoms (LUTS) -restart flomax - tamsulosin (FLOMAX) 0.4 MG CAPS capsule; Take 2 capsules (0.8 mg total) by mouth at bedtime.  Dispense: 60 capsule; Refill: 11 - finasteride (PROSCAR) 5 MG  tablet; Take 1 tablet (5 mg total) by mouth daily.  Dispense: 90 tablet; Refill: 3  3. Urinary retention -restart flomax 0.37m daily.    No follow-ups on file.  PNicolette Bang MD  [facility] [place]

## 2022-03-21 ENCOUNTER — Ambulatory Visit (INDEPENDENT_AMBULATORY_CARE_PROVIDER_SITE_OTHER): Payer: Medicare (Managed Care) | Admitting: Family Medicine

## 2022-03-21 ENCOUNTER — Other Ambulatory Visit: Payer: Self-pay | Admitting: Family Medicine

## 2022-03-21 ENCOUNTER — Encounter: Payer: Self-pay | Admitting: Family Medicine

## 2022-03-21 VITALS — BP 124/76 | HR 76 | Temp 98.1°F | Resp 20 | Ht 68.0 in | Wt 175.4 lb

## 2022-03-21 DIAGNOSIS — R7989 Other specified abnormal findings of blood chemistry: Secondary | ICD-10-CM | POA: Diagnosis not present

## 2022-03-21 DIAGNOSIS — R062 Wheezing: Secondary | ICD-10-CM

## 2022-03-21 DIAGNOSIS — E1165 Type 2 diabetes mellitus with hyperglycemia: Secondary | ICD-10-CM

## 2022-03-21 DIAGNOSIS — R0609 Other forms of dyspnea: Secondary | ICD-10-CM | POA: Diagnosis not present

## 2022-03-21 DIAGNOSIS — E785 Hyperlipidemia, unspecified: Secondary | ICD-10-CM | POA: Diagnosis not present

## 2022-03-21 DIAGNOSIS — I10 Essential (primary) hypertension: Secondary | ICD-10-CM

## 2022-03-21 DIAGNOSIS — J449 Chronic obstructive pulmonary disease, unspecified: Secondary | ICD-10-CM

## 2022-03-21 LAB — COMPREHENSIVE METABOLIC PANEL
ALT: 12 U/L (ref 0–53)
AST: 15 U/L (ref 0–37)
Albumin: 4.3 g/dL (ref 3.5–5.2)
Alkaline Phosphatase: 59 U/L (ref 39–117)
BUN: 13 mg/dL (ref 6–23)
CO2: 29 mEq/L (ref 19–32)
Calcium: 9.9 mg/dL (ref 8.4–10.5)
Chloride: 98 mEq/L (ref 96–112)
Creatinine, Ser: 1.1 mg/dL (ref 0.40–1.50)
GFR: 64.02 mL/min (ref >60.00)
Glucose, Bld: 143 mg/dL — ABNORMAL HIGH (ref 70–99)
Potassium: 4 mEq/L (ref 3.5–5.1)
Sodium: 137 mEq/L (ref 135–145)
Total Bilirubin: 0.8 mg/dL (ref 0.2–1.2)
Total Protein: 8.2 g/dL (ref 6.0–8.3)

## 2022-03-21 LAB — LIPID PANEL
Cholesterol: 116 mg/dL (ref 0–200)
HDL: 39.6 mg/dL (ref >39.00)
LDL Cholesterol: 56 mg/dL (ref 0–99)
NonHDL: 76.36
Total CHOL/HDL Ratio: 3
Triglycerides: 100 mg/dL (ref 0.0–149.0)
VLDL: 20 mg/dL (ref 0.0–40.0)

## 2022-03-21 LAB — BRAIN NATRIURETIC PEPTIDE: Pro B Natriuretic peptide (BNP): 29 pg/mL (ref 0.0–100.0)

## 2022-03-21 MED ORDER — LOSARTAN POTASSIUM-HCTZ 50-12.5 MG PO TABS: 1.0000 | ORAL_TABLET | Freq: Every day | ORAL | 1 refills | Status: DC

## 2022-03-21 MED ORDER — PREDNISONE 20 MG PO TABS: 40.0000 mg | ORAL_TABLET | Freq: Every day | ORAL | 0 refills | Status: DC

## 2022-03-21 MED ORDER — GLUCOSE BLOOD VI STRP: ORAL_STRIP | 12 refills | Status: DC

## 2022-03-21 MED ORDER — ATORVASTATIN CALCIUM 10 MG PO TABS: 10.0000 mg | ORAL_TABLET | Freq: Every day | ORAL | 1 refills | Status: DC

## 2022-03-21 MED ORDER — ACCU-CHEK SOFTCLIX LANCETS MISC: 6 refills | Status: AC

## 2022-03-21 MED ORDER — ALBUTEROL SULFATE HFA 108 (90 BASE) MCG/ACT IN AERS: INHALATION_SPRAY | RESPIRATORY_TRACT | 1 refills | Status: DC

## 2022-03-21 NOTE — Patient Instructions (Addendum)
Blood pressure ok, I will check kidney test that was elevated in ER.  Cough could be a flare of COPD.  Start prednisone 2 pills/day for the next 5 days, continue Trelegy, albuterol if needed.  If cough is not improving in the next few days or any worsening symptoms return for recheck - here, or urgent care/emergency room if needed. If any other concerns on labs I will let you know.  Glad to hear the urinary symptoms are better.  Take care.

## 2022-03-21 NOTE — Progress Notes (Signed)
Subjective:  Patient ID: Phillip Maynard., male    DOB: 1941-02-25  Age: 81 y.o. MRN: [id number]  CC:  Chief Complaint  Patient presents with   Hyperlipidemia   Hypertension    Follow up on meds, pt is fasting    HPI Phillip Maynard. presents for   Hypertension: Losartan HCTZ 50/12.5 mg daily.no new side effects.  Home readings: 135-150/70's.  BP Readings from Last 3 Encounters:  03/21/22 124/76  03/14/22 (!) 150/65  03/12/22 (!) 154/68   Lab Results  Component Value Date   CREATININE 1.48 (H) 03/12/2022   Acute cystitis with hematuria, BPH with LUTS Treated by urology, note from Dr. Alyson Ingles 03/14/2022 after initial ER visit July 29.  Creatinine was elevated at that time from 1.00 baseline to 1.48 on July 29.  Treated initially with Cipro from the ER. Prior cystoscopy with transurethral resection of bladder tumor in April.  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 Initially pain with urination, small amounts. Now doing better, better flow, no fever, pain resolved.  New abx started by Dr. Alyson Ingles. Doing well and has follow up at end of month, planned repeat ultrasound.   Cough Also discussed at ER visit July 29.  Chest x-ray without concerns at that time.  (IMPRESSION:No active cardiopulmonary disease). History of COPD.  Pulmonologist Dr. Erin Fulling.  Treated with Stiolto previously, with prior flare of cough/wheeze off Stiolto and only using albuterol at his April 27 visit. Current cough past few weeks. Clear congestion at times. On Treglegy - uses one puff Qam. No missed doses. Albuterol use increased - 2 times per day. Wheezing, congestion that improves with albuterol. Some dyspnea with cough at times.  Tx: cough syrup DM at night - helps nighttime cough.  No chest pain, no swelling.   Diabetes: With history of hyperglycemia.  Intolerant to higher dose metformin.  Borderline control with A1c 7.5 in May on once daily  metformin.  On ARB and statin. Still taking metformin once per day. Needs strips.  Home readings - "good", unknown reading.  Microalbumin: 5.2 on 04/07/21.  Optho, foot exam, pneumovax: up to date.   Lab Results  Component Value Date   HGBA1C 7.5 (H) 12/16/2021   HGBA1C 7.8 (H) 09/29/2021   HGBA1C 7.5 (H) 04/07/2021   Lab Results  Component Value Date   MICROALBUR 5.2 (H) 04/07/2021   LDLCALC 64 09/29/2021   CREATININE 1.48 (H) 03/12/2022   Hyperlipidemia: Lipitor 10 mg daily.  Recent CMP noted with normal LFTs on July 29.  Total protein was slightly elevated 8.6 at that time. 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   Bladder cancer ([place]) 12/07/2021   COPD (chronic obstructive pulmonary disease) ([place]) 10/13/2021   Benign localized prostatic hyperplasia with lower urinary tract symptoms (LUTS) 01/13/2021   Acute cystitis with hematuria 01/13/2021   Nocturia 01/13/2021   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/13/2021   Skin lesion 10/12/2018   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 No longer on medicine   Past Surgical History:  Procedure Laterality Date   COLONOSCOPY  last 09/06/2016   CYSTOSCOPY N/A 09/08/2016   Procedure: FIREFLY INJECTIONS;  Surgeon: Cleon Gustin, MD;  Location: [facility];  Service: Urology;  Laterality: N/A;   CYSTOSCOPY W/ RETROGRADES Bilateral 11/18/2021   Procedure: CYSTOSCOPY WITH RETROGRADE PYELOGRAM;  Surgeon: Cleon Gustin, MD;  Location: [facility];  Service: Urology;  Laterality: Bilateral;   INCISION AND DRAINAGE OF WOUND Right 1970's   "leg" (04/08/2013)   LIVER BIOPSY  ~ 2011   POLYPECTOMY     PROCTOSCOPY N/A 09/08/2016   Procedure: RIGID PROCTOSCOPY;  Surgeon: Michael Boston, MD;  Location: [facility];  Service: General;  Laterality: N/A;   TONSILLECTOMY     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   Procedure: TRANSURETHRAL RESECTION OF BLADDER TUMOR (TURBT);  Surgeon: Cleon Gustin, MD;  Location: [facility];  Service: Urology;  Laterality: N/A;   Allergies  Allergen Reactions   Penicillins Anaphylaxis and Other (See Comments)    Has patient had a PCN reaction causing immediate rash, facial/tongue/throat swelling, SOB or lightheadedness with hypotension: Yes Has patient had a PCN reaction causing severe rash involving mucus membranes or skin necrosis: No Has patient had a PCN reaction that required hospitalization No Has patient had a PCN reaction occurring within the last 10 years: No If all of the above answers are "NO", then may proceed with Cephalosporin use.   Shellfish Allergy Anaphylaxis   Prior to Admission medications   Medication Sig Start Date End Date Taking? Authorizing Provider  albuterol (VENTOLIN HFA) 108 (90 Base) MCG/ACT inhaler TAKE 2 PUFFS BY MOUTH EVERY 6 HOURS AS NEEDED FOR WHEEZE OR SHORTNESS OF BREATH 12/09/21  Yes Wendie Agreste, MD  atorvastatin (LIPITOR) 10 MG tablet TAKE 1 TABLET EVERY DAY 07/26/21  Yes Wendie Agreste, MD  blood glucose meter kit and supplies Dispense based on patient and insurance preference. Use up to four times daily as directed. (FOR ICD-10 E10.9, E11.9). 09/29/21  Yes Wendie Agreste, MD  ciprofloxacin (CIPRO) 500 MG tablet Take 1 tablet (500 mg total) by mouth every 12 (twelve) hours. 03/12/22  Yes Sherrell Puller, PA-C  finasteride (PROSCAR) 5 MG tablet Take 1 tablet (5 mg total) by mouth daily. 03/14/22  Yes McKenzie, Candee Furbish, MD  Fluticasone-Umeclidin-Vilant (TRELEGY ELLIPTA) 100-62.5-25 MCG/ACT AEPB Inhale 1 puff into the lungs daily. 01/04/22  Yes Freddi Starr, MD  ketotifen (ZADITOR) 0.025 % ophthalmic solution 1 drop 2 (two) times daily.   Yes [provider]  losartan-hydrochlorothiazide (HYZAAR) 50-12.5 MG tablet Take 1 tablet by mouth daily. 09/29/21  Yes Wendie Agreste, MD  metFORMIN (GLUCOPHAGE) 500 MG tablet Take 1 tablet (500 mg total) by mouth 2 (two) times daily with a meal. 02/21/22  Yes Wendie Agreste, MD  prednisoLONE acetate (PRED MILD) 0.12 % ophthalmic suspension 1 drop 1 day or 1 dose.   Yes [provider]  tamsulosin (FLOMAX) 0.4 MG CAPS capsule Take 2 capsules (0.8 mg total) by mouth at bedtime. 03/14/22  Yes McKenzie, Candee Furbish, MD  nitrofurantoin, macrocrystal-monohydrate, (MACROBID) 100 MG capsule Take 1 capsule (100 mg total) by mouth every 12 (twelve) hours. Patient not taking: Reported on 03/21/2022 03/14/22   Cleon Gustin, MD  oxyCODONE-acetaminophen (PERCOCET) 5-325 MG tablet Take 1 tablet by mouth every 4 (four) hours as needed for severe pain. Patient not taking: Reported on 03/21/2022 11/18/21 11/18/22  Cleon Gustin, MD   Social History   Socioeconomic History   Marital status: Married    Spouse name: Not on file   Number of children: 2   Years of education: Not on file   Highest education level: Not on file  Occupational History   Occupation: truck driver    Employer: [employer]    Comment: retired   Tobacco Use   Smoking status: Former    Packs/day: 0.25    Years: 45.00    Total pack years: 11.25    Types: Cigarettes    Quit date: 03/05/2021    Years since quitting: 1.0   Smokeless tobacco: Never  Vaping Use   Vaping Use: Never used  Substance and Sexual Activity   Alcohol use: Yes    Alcohol/week: 12.0 standard drinks of alcohol    Types: 12 Cans of beer per week    Comment: 12 pack a week.   Drug use: No   Sexual activity: Yes  Other Topics Concern   Not on file  Social History Narrative   Pt lives in 1 story home with his wife   Has 2 adult children   Highest level of education: GED & Trade school   Retired Media planner.    Social Determinants of Health   Financial Resource Strain: Low Risk  (07/15/2021)   Overall Financial Resource Strain (CARDIA)    Difficulty of Paying Living Expenses: Not hard at all  Food Insecurity: No Food Insecurity (07/15/2021)   Hunger Vital Sign    Worried About Running Out of Food in the Last Year: Never true    Ran Out of Food in the Last Year: Never true  Transportation Needs: No Transportation Needs (07/15/2021)   PRAPARE - Hydrologist (Medical): No    Lack of Transportation (Non-Medical): No  Physical Activity: Inactive (07/15/2021)   Exercise Vital Sign    Days of Exercise per Week: 0 days    Minutes of Exercise per Session: 0 min  Stress: No Stress Concern Present (07/15/2021)   [place]    Feeling of Stress : Not at all  Social Connections: Moderately Integrated (07/15/2021)   Social Connection and Isolation Panel [NHANES]    Frequency of Communication with Friends and Family: Twice a week    Frequency of Social Gatherings with Friends and Family: Twice a week    Attends Religious Services: 1 to 4 times per year    Active Member of [employer] or Organizations: No    Attends Archivist Meetings: Never    Marital  Status: Married  Human resources officer Violence: Not At Risk (07/15/2021)   Humiliation, Afraid, Rape, and Kick questionnaire    Fear of Current or Ex-Partner: No    Emotionally Abused: No    Physically Abused:  No    Sexually Abused: No    Review of Systems  Constitutional:  Negative for fatigue and unexpected weight change.  Eyes:  Negative for visual disturbance.  Respiratory:  Negative for cough, chest tightness and shortness of breath (no new sx's, chronic cough.).   Cardiovascular:  Negative for chest pain, palpitations and leg swelling.  Gastrointestinal:  Negative for abdominal pain and blood in stool.  Neurological:  Negative for dizziness, light-headedness and headaches.     Objective:   Vitals:   03/21/22 0909  BP: 124/76  Pulse: 76  Resp: 20  Temp: 98.1 F (36.7 C)  SpO2: 96%  Weight: 175 lb 6.4 oz (79.6 kg)  Height: _0  (1.727 m)     Physical Exam Vitals reviewed.  Constitutional:      Appearance: He is well-developed.  HENT:     Head: Normocephalic and atraumatic.  Neck:     Vascular: No carotid bruit or JVD.  Cardiovascular:     Rate and Rhythm: Normal rate and regular rhythm.     Heart sounds: Normal heart sounds. No murmur heard. Pulmonary:     Effort: Pulmonary effort is normal.     Breath sounds: Wheezing (Coarse breath sounds lower but diffuse expiratory wheeze.  Normal effort, no distress, speaking in full sentences.) and rales present.  Abdominal:     General: There is no distension.     Tenderness: There is no abdominal tenderness. There is no right CVA tenderness, left CVA tenderness or guarding.  Musculoskeletal:     Right lower leg: No edema.     Left lower leg: No edema.  Skin:    General: Skin is warm and dry.  Neurological:     Mental Status: He is alert and oriented to person, place, and time.  Psychiatric:        Mood and Affect: Mood normal.        Assessment & Plan:  Aedon Deason. is a 81 y.o. male . Type 2  diabetes mellitus with hyperglycemia, without long-term current use of insulin (HCC) - Plan: Accu-Chek Softclix Lancets lancets  Tolerating current dose metformin, continue same.  Check glucose on CMP, watch readings closely on prednisone with RTC precautions discussed.  Chronic obstructive pulmonary disease, unspecified COPD type ([place]) - Plan: predniSONE (DELTASONE) 20 MG tablet, Accu-Chek Softclix Lancets lancets Wheezing - Plan: albuterol (VENTOLIN HFA) 108 (90 Base) MCG/ACT inhaler, Accu-Chek Softclix Lancets lancets, Brain natriuretic peptide DOE (dyspnea on exertion) - Plan: albuterol (VENTOLIN HFA) 108 (90 Base) MCG/ACT inhaler, Accu-Chek Softclix Lancets lancets, Brain natriuretic peptide  -Suspect COPD flare, short-term prednisone prescribed with potential side effects discussed.  Check BNP but no sign of fluid overload on exam.  Continue albuterol as needed for breakthrough wheezing with RTC/ER precautions given.  Essential hypertension - Plan: losartan-hydrochlorothiazide (HYZAAR) 50-12.5 MG tablet, Comprehensive metabolic panel  -  Stable, tolerating current regimen. Medications refilled. Labs pending as above.   Hyperlipidemia, unspecified hyperlipidemia type - Plan: atorvastatin (LIPITOR) 10 MG tablet, Lipid panel  -  Stable, tolerating current regimen. Medications refilled. Labs pending as above.   Elevated serum creatinine - Plan: Comprehensive metabolic panel  -Recent cystitis, urinary symptoms have improved.  Check updated creatinine.  Continue follow-up with urology.  Meds ordered this encounter  Medications   albuterol (VENTOLIN HFA) 108 (90 Base) MCG/ACT inhaler    Sig: TAKE 2 PUFFS BY MOUTH EVERY 6 HOURS AS NEEDED FOR WHEEZE OR SHORTNESS OF BREATH    Dispense:  18 each    Refill:  1    PATIENT NEEDS REFILL   losartan-hydrochlorothiazide (HYZAAR) 50-12.5 MG tablet    Sig: Take 1 tablet by mouth daily.    Dispense:  90 tablet    Refill:  1   atorvastatin (LIPITOR) 10  MG tablet    Sig: Take 1 tablet (10 mg total) by mouth daily.    Dispense:  90 tablet    Refill:  1   predniSONE (DELTASONE) 20 MG tablet    Sig: Take 2 tablets (40 mg total) by mouth daily with breakfast.    Dispense:  10 tablet    Refill:  0   Accu-Chek Softclix Lancets lancets    Sig: Use as instructed    Dispense:  100 each    Refill:  6   Patient Instructions  Blood pressure ok, I will check kidney test that was elevated in ER.  Cough could be a flare of COPD.  Start prednisone 2 pills/day for the next 5 days, continue Trelegy, albuterol if needed.  If cough is not improving in the next few days or any worsening symptoms return for recheck - here, or urgent care/emergency room if needed. If any other concerns on labs I will let you know.  Glad to hear the urinary symptoms are better.  Take care.     Signed,   [name], MD [place] 03/21/22 9:48 AM

## 2022-04-11 ENCOUNTER — Encounter: Payer: Self-pay | Admitting: Pulmonary Disease

## 2022-04-11 ENCOUNTER — Ambulatory Visit (INDEPENDENT_AMBULATORY_CARE_PROVIDER_SITE_OTHER): Payer: Medicare (Managed Care) | Admitting: Pulmonary Disease

## 2022-04-11 VITALS — BP 126/74 | HR 56 | Temp 97.7°F | Ht 69.0 in | Wt 177.0 lb

## 2022-04-11 DIAGNOSIS — D721 Eosinophilia, unspecified: Secondary | ICD-10-CM | POA: Diagnosis not present

## 2022-04-11 DIAGNOSIS — J4540 Moderate persistent asthma, uncomplicated: Secondary | ICD-10-CM

## 2022-04-11 MED ORDER — IPRATROPIUM-ALBUTEROL 0.5-2.5 (3) MG/3ML IN SOLN: 3.0000 mL | RESPIRATORY_TRACT | 2 refills | Status: DC | PRN

## 2022-04-11 MED ORDER — MONTELUKAST SODIUM 10 MG PO TABS: 10.0000 mg | ORAL_TABLET | Freq: Every day | ORAL | 11 refills | Status: DC

## 2022-04-11 NOTE — Progress Notes (Signed)
Synopsis: Referred in March 2023 for COPD by [name], MD  Subjective:   PATIENT ID: Phillip Maynard. GENDER: male DOB: [birth year], MRN: [id number]  HPI  Chief Complaint  Patient presents with   Follow-up    Breathing is doing well overall. He has some cough with thick, clear sputum- mainly at night and early am. He has also noticed some wheezing at night- using albuterol 4-5 x per wk on average.    [name] is a 81 year old male, former smoker with GERD, hypertension and DMII who returns to pulmonary clinic for asthma.  He was started on trelelgy ellipta at last visit with improvement of his symptoms. He continues to have cough, mucous production and intermittent wheezing though.  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 Evaluation is not concerning for other etiologies of elevated eosinophils at this time other than what is likely related to his asthma.   OV 12/27/21 He is using flovent 121mg 2 puffs twice daily and did notice improvement in his breathing with the addition of stiolto inhaler after last visit. He is using albuterol a couple of times per week.   PFTs are within normal limits today.   OV 10/26/21 Patient is having exertional dyspnea when walking to his mail box that has a slight incline in the driveway. Over recent weeks he is needing to stop to rest due to the shortness of breath. He does experience intermittent wheezing, mainly dry cough but will have some clear mucous production every once in a while. He denies night time awakenings due to shortness of breath, cough or wheezing. He has gained about 12lbs over the last year.   He has been using flovent 1157m 2 puffs twice daily and spiriva have helped improve his breathing. He reports taking prednisone 3 weeks ago which helped his breathing and cough.   He quit smoking last year. He was smoking 0.5 to 1 pack per day for 30 years. 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 He lives with his wife. His mother had asthma and his brother and sisters have asthma.   He is having dental surgery in the near future. No issues with anesthesia in the past.   Past Medical History:  Diagnosis Date   Asthma    "when I was a boy" (04/08/2013)   Bronchitis    Colon polyps    Colovesical fistula    COPD (chronic obstructive pulmonary disease) (HCC)    Diverticulosis    GERD (gastroesophageal reflux disease)    Hepatitis C    treated with injections   High cholesterol    Hypertension    Type II diabetes mellitus (HCC)    hx of . No longer on medicine     Family History  Problem Relation Age of Onset   Asthma Mother    Heart attack Mother    Colon polyps Neg Hx    Colon cancer Neg Hx    Esophageal cancer Neg Hx    Rectal cancer Neg Hx    Stomach cancer Neg Hx      Social History   Socioeconomic History   Marital status: Married    Spouse name: Not on file   Number of children: 2   Years of education: Not on file   Highest education level: Not on file  Occupational History   Occupation:  truck Education administrator: [employer]    Comment: retired  Tobacco Use   Smoking status: Former    Packs/day: 0.25    Years: 45.00    Total pack years: 11.25    Types: Cigarettes    Quit date: 03/05/2021    Years since quitting: 1.1   Smokeless tobacco: Never  Vaping Use   Vaping Use: Never used  Substance and Sexual Activity   Alcohol use: Yes    Alcohol/week: 12.0 standard drinks of alcohol    Types: 12 Cans of beer per week    Comment: 12 pack a week.   Drug use: No   Sexual activity: Yes  Other Topics Concern   Not on file  Social History Narrative   Pt lives in 1 story home with his wife   Has 2 adult children   Highest level of education: GED & Trade school   Retired Media planner.    Social Determinants of Health   Financial Resource Strain:  Low Risk  (07/15/2021)   Overall Financial Resource Strain (CARDIA)    Difficulty of Paying Living Expenses: Not hard at all  Food Insecurity: No Food Insecurity (07/15/2021)   Hunger Vital Sign    Worried About Running Out of Food in the Last Year: Never true    Ran Out of Food in the Last Year: Never true  Transportation Needs: No Transportation Needs (07/15/2021)   PRAPARE - Hydrologist (Medical): No    Lack of Transportation (Non-Medical): No  Physical Activity: Inactive (07/15/2021)   Exercise Vital Sign    Days of Exercise per Week: 0 days    Minutes of Exercise per Session: 0 min  Stress: No Stress Concern Present (07/15/2021)   [place]    Feeling of Stress : Not at all  Social Connections: Moderately Integrated (07/15/2021)   Social Connection and Isolation Panel [NHANES]    Frequency of Communication with Friends and Family: Twice a week    Frequency of Social Gatherings with Friends and Family: Twice a week    Attends Religious Services: 1 to 4 times per year    Active Member of [employer] or Organizations: No    Attends Archivist Meetings: Never    Marital Status: Married  Human resources officer Violence: Not At Risk (07/15/2021)   Humiliation, Afraid, Rape, and Kick questionnaire    Fear of Current or Ex-Partner: No    Emotionally Abused: No    Physically Abused: No    Sexually Abused: No     Allergies  Allergen Reactions   Penicillins Anaphylaxis and Other (See Comments)    Has patient had a PCN reaction causing immediate rash, facial/tongue/throat swelling, SOB or lightheadedness with hypotension: Yes Has patient had a PCN reaction causing severe rash involving mucus membranes or skin necrosis: No Has patient had a PCN reaction that required hospitalization No Has patient had a PCN reaction occurring within the last 10 years: No If all of the above answers are "NO", then  may proceed with Cephalosporin use.   Shellfish Allergy Anaphylaxis     Outpatient Medications Prior to Visit  Medication Sig Dispense Refill   Accu-Chek Softclix Lancets lancets Use as instructed 100 each 6   albuterol (VENTOLIN HFA) 108 (90 Base) MCG/ACT inhaler TAKE 2 PUFFS BY MOUTH EVERY 6 HOURS AS NEEDED FOR WHEEZE OR SHORTNESS OF BREATH 18 each 1   atorvastatin (  LIPITOR) 10 MG tablet Take 1 tablet (10 mg total) by mouth daily. 90 tablet 1   blood glucose meter kit and supplies Dispense based on patient and insurance preference. Use up to four times daily as directed. (FOR ICD-10 E10.9, E11.9). 1 each 0   finasteride (PROSCAR) 5 MG tablet Take 1 tablet (5 mg total) by mouth daily. 90 tablet 3   Fluticasone-Umeclidin-Vilant (TRELEGY ELLIPTA) 100-62.5-25 MCG/ACT AEPB Inhale 1 puff into the lungs daily. 180 each 1   glucose blood test strip Use as instructed 100 each 12   ketotifen (ZADITOR) 0.025 % ophthalmic solution 1 drop 2 (two) times daily.     losartan-hydrochlorothiazide (HYZAAR) 50-12.5 MG tablet Take 1 tablet by mouth daily. 90 tablet 1   metFORMIN (GLUCOPHAGE) 500 MG tablet Take 1 tablet (500 mg total) by mouth 2 (two) times daily with a meal. 90 tablet 1   oxyCODONE-acetaminophen (PERCOCET) 5-325 MG tablet Take 1 tablet by mouth every 4 (four) hours as needed for severe pain. 30 tablet 0   prednisoLONE acetate (PRED MILD) 0.12 % ophthalmic suspension 1 drop 1 day or 1 dose.     tamsulosin (FLOMAX) 0.4 MG CAPS capsule Take 2 capsules (0.8 mg total) by mouth at bedtime. 60 capsule 11   ciprofloxacin (CIPRO) 500 MG tablet Take 1 tablet (500 mg total) by mouth every 12 (twelve) hours. 28 tablet 0   nitrofurantoin, macrocrystal-monohydrate, (MACROBID) 100 MG capsule Take 1 capsule (100 mg total) by mouth every 12 (twelve) hours. (Patient not taking: Reported on 03/21/2022) 14 capsule 0   predniSONE (DELTASONE) 20 MG tablet Take 2 tablets (40 mg total) by mouth daily with breakfast. 10  tablet 0   No facility-administered medications prior to visit.   Review of Systems  Constitutional:  Negative for chills, fever, malaise/fatigue and weight loss.  HENT:  Negative for congestion, sinus pain and sore throat.   Eyes: Negative.   Respiratory:  Positive for cough, sputum production, shortness of breath and wheezing. Negative for hemoptysis.   Cardiovascular:  Negative for chest pain, palpitations, orthopnea, claudication and leg swelling.  Gastrointestinal:  Negative for abdominal pain, heartburn, nausea and vomiting.  Genitourinary: Negative.   Musculoskeletal:  Negative for joint pain and myalgias.  Skin:  Negative for rash.  Neurological:  Negative for weakness.  Endo/Heme/Allergies: Negative.   Psychiatric/Behavioral: Negative.      Objective:   Vitals:   04/11/22 0910  BP: 126/74  Pulse: (!) 56  Temp: 97.7 F (36.5 C)  TempSrc: Oral  SpO2: 97%  Weight: 177 lb (80.3 kg)  Height: '5\' 9"'  (1.753 m)    Physical Exam Constitutional:      General: He is not in acute distress. HENT:     Head: Normocephalic and atraumatic.  Cardiovascular:     Rate and Rhythm: Normal rate and regular rhythm.     Pulses: Normal pulses.     Heart sounds: Normal heart sounds. No murmur heard. Pulmonary:     Effort: Pulmonary effort is normal.     Breath sounds: Decreased breath sounds present. No wheezing, rhonchi or rales.  Musculoskeletal:     Right lower leg: No edema.     Left lower leg: No edema.  Skin:    General: Skin is warm and dry.  Neurological:     General: No focal deficit present.     Mental Status: He is alert.  Psychiatric:        Mood and Affect: Mood normal.  Behavior: Behavior normal.        Thought Content: Thought content normal.        Judgment: Judgment normal.    CBC    Component Value Date/Time   WBC 9.1 03/12/2022 1415   RBC 4.75 03/12/2022 1415   HGB 14.3 03/12/2022 1415   HGB 14.5 01/19/2022 1228   HCT 43.8 03/12/2022 1415    PLT 354 03/12/2022 1415   PLT 247 01/19/2022 1228   MCV 92.2 03/12/2022 1415   MCV 93.7 12/02/2013 1003   MCH 30.1 03/12/2022 1415   MCHC 32.6 03/12/2022 1415   RDW 13.4 03/12/2022 1415   LYMPHSABS 3.1 03/12/2022 1415   MONOABS 0.7 03/12/2022 1415   EOSABS 1.4 (H) 03/12/2022 1415   BASOSABS 0.1 03/12/2022 1415      Latest Ref Rng & Units 03/21/2022    9:50 AM 03/12/2022    2:15 PM 01/19/2022   12:28 PM  BMP  Glucose 70 - 99 mg/dL 143  112  105   BUN 6 - 23 mg/dL '13  21  10   ' Creatinine 0.40 - 1.50 mg/dL 1.10  1.48  1.00   Sodium 135 - 145 mEq/L 137  137  138   Potassium 3.5 - 5.1 mEq/L 4.0  4.1  3.8   Chloride 96 - 112 mEq/L 98  101  101   CO2 19 - 32 mEq/L '29  29  31   ' Calcium 8.4 - 10.5 mg/dL 9.9  9.7  9.9    Chest imaging: CXR 09/17/21 1. Cardiomegaly. 2. Low lung volumes with mild atelectasis at the lung bases.  PFT:    Latest Ref Rng & Units 12/27/2021    9:39 AM  PFT Results  FVC-Pre L 2.47   FVC-Predicted Pre % 72   FVC-Post L 2.74   FVC-Predicted Post % 80   Pre FEV1/FVC % % 77   Post FEV1/FCV % % 76   FEV1-Pre L 1.90   FEV1-Predicted Pre % 76   FEV1-Post L 2.09   DLCO uncorrected ml/min/mmHg 18.59   DLCO UNC% % 80   DLCO corrected ml/min/mmHg 19.09   DLCO COR %Predicted % 82   DLVA Predicted % 96   TLC L 5.73   TLC % Predicted % 86   RV % Predicted % 122     Labs:  Path:  Echo:  Heart Catheterization:  Assessment & Plan:   Moderate persistent asthma without complication - Plan: montelukast (SINGULAIR) 10 MG tablet, Flutter valve, Ambulatory Referral for DME, ipratropium-albuterol (DUONEB) 0.5-2.5 (3) MG/3ML SOLN  Eosinophilia, unspecified type - Plan: Ambulatory Referral for DME  Discussion: Phillip Maynard is a 81 year old male, former smoker with GERD, hypertension and DMII who returns to pulmonary clinic for asthma.  He has significant smoking history along with occupational dust exposures that place him at risk for obstructive lung  disease. His pulmonary function tests are within normal limits. He appears to have moderate persistent asthma based on clinical history.  He is to continue trelegy ellipta 100, 1 puff daily. We will start him on duoneb nebulizer treatment twice daily followed by flutter valve therapy for airway clearance. We will also start him on montelukast 61m each evening.   He is to call our office in 1 month if he continues to experience cough, wheezing and sputum production. We will increase his trelegy ellipta dose then.   Appreciate hematology input regarding his elevated eosinophil level. He has follow up in December with them.  Follow up in 6 months.  Freda Jackson, MD [facility] Office: [phone]   Current Outpatient Medications:    Accu-Chek Softclix Lancets lancets, Use as instructed, Disp: 100 each, Rfl: 6   albuterol (VENTOLIN HFA) 108 (90 Base) MCG/ACT inhaler, TAKE 2 PUFFS BY MOUTH EVERY 6 HOURS AS NEEDED FOR WHEEZE OR SHORTNESS OF BREATH, Disp: 18 each, Rfl: 1   atorvastatin (LIPITOR) 10 MG tablet, Take 1 tablet (10 mg total) by mouth daily., Disp: 90 tablet, Rfl: 1   blood glucose meter kit and supplies, Dispense based on patient and insurance preference. Use up to four times daily as directed. (FOR ICD-10 E10.9, E11.9)., Disp: 1 each, Rfl: 0   finasteride (PROSCAR) 5 MG tablet, Take 1 tablet (5 mg total) by mouth daily., Disp: 90 tablet, Rfl: 3   Fluticasone-Umeclidin-Vilant (TRELEGY ELLIPTA) 100-62.5-25 MCG/ACT AEPB, Inhale 1 puff into the lungs daily., Disp: 180 each, Rfl: 1   glucose blood test strip, Use as instructed, Disp: 100 each, Rfl: 12   ipratropium-albuterol (DUONEB) 0.5-2.5 (3) MG/3ML SOLN, Take 3 mLs by nebulization every 4 (four) hours as needed., Disp: 360 mL, Rfl: 2   ketotifen (ZADITOR) 0.025 % ophthalmic solution, 1 drop 2 (two) times daily., Disp: , Rfl:    losartan-hydrochlorothiazide (HYZAAR) 50-12.5 MG tablet, Take 1 tablet by mouth  daily., Disp: 90 tablet, Rfl: 1   metFORMIN (GLUCOPHAGE) 500 MG tablet, Take 1 tablet (500 mg total) by mouth 2 (two) times daily with a meal., Disp: 90 tablet, Rfl: 1   montelukast (SINGULAIR) 10 MG tablet, Take 1 tablet (10 mg total) by mouth at bedtime., Disp: 30 tablet, Rfl: 11   oxyCODONE-acetaminophen (PERCOCET) 5-325 MG tablet, Take 1 tablet by mouth every 4 (four) hours as needed for severe pain., Disp: 30 tablet, Rfl: 0   prednisoLONE acetate (PRED MILD) 0.12 % ophthalmic suspension, 1 drop 1 day or 1 dose., Disp: , Rfl:    tamsulosin (FLOMAX) 0.4 MG CAPS capsule, Take 2 capsules (0.8 mg total) by mouth at bedtime., Disp: 60 capsule, Rfl: 11

## 2022-04-11 NOTE — Patient Instructions (Addendum)
Continue trelegy ellipta 1 puff daily - rinse mouth out after each use  We will order you a nebulizer machine and supplies along with a flutter valve.   Use duoneb nebulizer treatment morning and evening time followed by flutter valve for mucous clearance  Start montelukast '10mg'$  each evening for allergies/asthma  Continue to use albuterol inhaler or duoneb nebulizer treatment every 4-6 hours as needed  Please call in 1 month if symptoms continue and we will increase your trelegy dose  Follow up in 6 months

## 2022-04-12 DIAGNOSIS — J4540 Moderate persistent asthma, uncomplicated: Secondary | ICD-10-CM | POA: Diagnosis not present

## 2022-04-12 DIAGNOSIS — D721 Eosinophilia, unspecified: Secondary | ICD-10-CM | POA: Diagnosis not present

## 2022-04-13 ENCOUNTER — Telehealth: Payer: Self-pay | Admitting: Pulmonary Disease

## 2022-04-13 ENCOUNTER — Encounter: Payer: Self-pay | Admitting: Urology

## 2022-04-13 ENCOUNTER — Ambulatory Visit (INDEPENDENT_AMBULATORY_CARE_PROVIDER_SITE_OTHER): Payer: Medicare (Managed Care) | Admitting: Urology

## 2022-04-13 VITALS — BP 162/70 | HR 63

## 2022-04-13 DIAGNOSIS — C679 Malignant neoplasm of bladder, unspecified: Secondary | ICD-10-CM | POA: Diagnosis not present

## 2022-04-13 DIAGNOSIS — Z8551 Personal history of malignant neoplasm of bladder: Secondary | ICD-10-CM | POA: Diagnosis not present

## 2022-04-13 LAB — URINALYSIS, ROUTINE W REFLEX MICROSCOPIC
Bilirubin, UA: NEGATIVE
Glucose, UA: NEGATIVE
Ketones, UA: NEGATIVE
Leukocytes,UA: NEGATIVE
Nitrite, UA: NEGATIVE
Protein,UA: NEGATIVE
RBC, UA: NEGATIVE
Specific Gravity, UA: 1.02 (ref 1.005–1.030)
Urobilinogen, Ur: 0.2 mg/dL (ref 0.2–1.0)
pH, UA: 5 (ref 5.0–7.5)

## 2022-04-13 MED ORDER — CIPROFLOXACIN HCL 500 MG PO TABS
500.0000 mg | ORAL_TABLET | Freq: Once | ORAL | Status: AC
  Administered 2022-04-13: 500 mg via ORAL

## 2022-04-13 NOTE — Patient Instructions (Signed)

## 2022-04-13 NOTE — Progress Notes (Signed)
   04/13/22  CC: followup bladder cancer   HPI: Phillip Maynard is 81yo here for followup for bladder cancer Blood pressure (!) 162/70, pulse 63. NED. A&Ox3.   No respiratory distress   Abd soft, NT, ND Normal phallus with bilateral descended testicles  Cystoscopy Procedure Note  Patient identification was confirmed, informed consent was obtained, and patient was prepped using Betadine solution.  Lidocaine jelly was administered per urethral meatus.     Pre-Procedure: - Inspection reveals a normal caliber ureteral meatus.  Procedure: The flexible cystoscope was introduced without difficulty - No urethral strictures/lesions are present. - Enlarged prostate  - Normal bladder neck - Bilateral ureteral orifices identified - Bladder mucosa  reveals no ulcers, tumors, or lesions - No bladder stones - No trabeculation   Post-Procedure: - Patient tolerated the procedure well  Assessment/ Plan: RTC 3 months for cystoscopy  No follow-ups on file.  Nicolette Bang, MD

## 2022-04-15 NOTE — Telephone Encounter (Signed)
Spoke with the pt  He states that his insurance company told him that the [employer] we ordered needs PA  Routing to PA team  Thanks!

## 2022-04-19 ENCOUNTER — Telehealth: Payer: Self-pay | Admitting: Family Medicine

## 2022-04-19 ENCOUNTER — Other Ambulatory Visit (HOSPITAL_COMMUNITY): Payer: Self-pay

## 2022-04-19 NOTE — Telephone Encounter (Signed)
Unable to reach pt no answer and no VM

## 2022-04-19 NOTE — Telephone Encounter (Signed)
Pt has to have appt to have clearance done  (This appt has been requested 3 times previous to this call)

## 2022-04-19 NOTE — Telephone Encounter (Signed)
Caller name: Gwen @ Dr. Lupita Leash office  On DPR? :yes/no: Yes  Call back number: [phone]  Provider they see: Carlota Raspberry  Reason for call: Calling to get medical clearance for surgery so he can be scheduled.

## 2022-04-20 NOTE — Telephone Encounter (Signed)
Iran Planas, CPhT to Me  Rx Prior Auth Team      04/19/22  8:19 AM Test claims for Duoneb come back with a $4.20 co-pay. The Nebulizer machine and it's equipment will be billed under Medicare Part B if patient has. PA Team does not have ability to run test claims for Medicare Part B.   Tried calling pt, no answer and no VM picked up

## 2022-04-22 ENCOUNTER — Telehealth: Payer: Self-pay | Admitting: *Deleted

## 2022-04-22 NOTE — Patient Outreach (Signed)
  Care Coordination   04/22/2022 Name: Phillip Maynard. MRN: [id number] DOB: [birth year]   Care Coordination Outreach Attempts:  An unsuccessful telephone outreach was attempted today to offer the patient information about available care coordination services as a benefit of their health plan.   Follow Up Plan:  Additional outreach attempts will be made to offer the patient care coordination information and services.   Encounter Outcome:  No Answer  Care Coordination Interventions Activated:  No   Care Coordination Interventions:  No, not indicated    Raina Mina, RN Care Management Coordinator [place] Office [phone]

## 2022-05-13 ENCOUNTER — Telehealth: Payer: Self-pay | Admitting: *Deleted

## 2022-05-13 NOTE — Patient Outreach (Signed)
  Care Coordination   Initial Visit Note   05/13/2022 Name: Phillip Maynard. MRN: [id number] DOB: Jan 10, 1941  Phillip Maynard. is a 81 y.o. year old male who sees Wendie Agreste, MD for primary care. I spoke with  Phillip Maynard. by phone today.  What matters to the patients health and wellness today?  No needs    Goals Addressed               This Visit's Progress     COMPLETED: No needs (pt-stated)        Care Coordination Interventions: Advised patient to Encouraged pt to scheduled his AWV as pt reported it has been scheduled for Dec 2023 with his primary provider. Reviewed medications with patient and discussed adherence with no needed refills or issues. Reviewed scheduled/upcoming provider appointments including pending appointments Screening for signs and symptoms of depression related to chronic disease state            SDOH assessments and interventions completed:  Yes  SDOH Interventions Today    Flowsheet Row Most Recent Value  SDOH Interventions   Food Insecurity Interventions Intervention Not Indicated  Housing Interventions Intervention Not Indicated  Transportation Interventions Intervention Not Indicated  Utilities Interventions Intervention Not Indicated        Care Coordination Interventions Activated:  Yes  Care Coordination Interventions:  Yes, provided   Follow up plan: No further intervention required.   Encounter Outcome:  Pt. Visit Completed   Raina Mina, RN Care Management Coordinator [place] Office [phone]

## 2022-05-13 NOTE — Patient Instructions (Signed)
Visit Information  Thank you for taking time to visit with me today. Please don't hesitate to contact me if I can be of assistance to you.   Following are the goals we discussed today:   Goals Addressed               This Visit's Progress     COMPLETED: No needs (pt-stated)        Care Coordination Interventions: Advised patient to Encouraged pt to scheduled his AWV as pt reported it has been scheduled for Dec 2023 with his primary provider. Reviewed medications with patient and discussed adherence with no needed refills or issues. Reviewed scheduled/upcoming provider appointments including pending appointments Screening for signs and symptoms of depression related to chronic disease state            Please call the care guide team at [phone] if you need to cancel or reschedule your appointment.   If you are experiencing a Mental Health or [place] or need someone to talk to, please call the Suicide and Crisis Lifeline: 988  Patient verbalizes understanding of instructions and care plan provided today and agrees to view in [place]. Active MyChart status and patient understanding of how to access instructions and care plan via MyChart confirmed with patient.     No further follow up required: No needs     Raina Mina, RN Care Management Coordinator [place] Office [phone]

## 2022-05-15 DIAGNOSIS — D721 Eosinophilia, unspecified: Secondary | ICD-10-CM | POA: Diagnosis not present

## 2022-05-15 DIAGNOSIS — J4540 Moderate persistent asthma, uncomplicated: Secondary | ICD-10-CM | POA: Diagnosis not present

## 2022-07-13 DIAGNOSIS — J4540 Moderate persistent asthma, uncomplicated: Secondary | ICD-10-CM | POA: Diagnosis not present

## 2022-07-13 DIAGNOSIS — D721 Eosinophilia, unspecified: Secondary | ICD-10-CM | POA: Diagnosis not present

## 2022-07-15 ENCOUNTER — Ambulatory Visit (INDEPENDENT_AMBULATORY_CARE_PROVIDER_SITE_OTHER): Payer: Medicare (Managed Care) | Admitting: Urology

## 2022-07-15 VITALS — BP 144/66 | HR 75

## 2022-07-15 DIAGNOSIS — C679 Malignant neoplasm of bladder, unspecified: Secondary | ICD-10-CM

## 2022-07-15 LAB — URINALYSIS, ROUTINE W REFLEX MICROSCOPIC
Bilirubin, UA: NEGATIVE
Glucose, UA: NEGATIVE
Ketones, UA: NEGATIVE
Leukocytes,UA: NEGATIVE
Nitrite, UA: NEGATIVE
Protein,UA: NEGATIVE
RBC, UA: NEGATIVE
Specific Gravity, UA: 1.02 (ref 1.005–1.030)
Urobilinogen, Ur: 0.2 mg/dL (ref 0.2–1.0)
pH, UA: 5 (ref 5.0–7.5)

## 2022-07-15 MED ORDER — CIPROFLOXACIN HCL 500 MG PO TABS
500.0000 mg | ORAL_TABLET | Freq: Once | ORAL | Status: AC
  Administered 2022-07-15: 500 mg via ORAL

## 2022-07-15 NOTE — Progress Notes (Signed)
   07/15/22  CC: followup bladder tumor   HPI: Mr Chew is a 81yo here for followup for bladder cancer Blood pressure (!) 144/66, pulse 75. NED. A&Ox3.   No respiratory distress   Abd soft, NT, ND Normal phallus with bilateral descended testicles  Cystoscopy Procedure Note  Patient identification was confirmed, informed consent was obtained, and patient was prepped using Betadine solution.  Lidocaine jelly was administered per urethral meatus.     Pre-Procedure: - Inspection reveals a normal caliber ureteral meatus.  Procedure: The flexible cystoscope was introduced without difficulty - No urethral strictures/lesions are present. - Enlarged prostate  - Normal bladder neck - Bilateral ureteral orifices identified - Bladder mucosa  reveals no ulcers, tumors, or lesions - No bladder stones - No trabeculation    Post-Procedure: - Patient tolerated the procedure well  Assessment/ Plan: 3 months for cystoscopy  No follow-ups on file.  Nicolette Bang, MD

## 2022-07-20 ENCOUNTER — Inpatient Hospital Stay: Payer: Medicare (Managed Care)

## 2022-07-20 ENCOUNTER — Inpatient Hospital Stay: Payer: Medicare (Managed Care) | Admitting: Hematology and Oncology

## 2022-07-21 ENCOUNTER — Telehealth: Payer: Self-pay | Admitting: *Deleted

## 2022-07-21 ENCOUNTER — Ambulatory Visit (INDEPENDENT_AMBULATORY_CARE_PROVIDER_SITE_OTHER): Payer: Medicare (Managed Care) | Admitting: *Deleted

## 2022-07-21 DIAGNOSIS — Z0000 Encounter for general adult medical examination without abnormal findings: Secondary | ICD-10-CM

## 2022-07-21 DIAGNOSIS — E1165 Type 2 diabetes mellitus with hyperglycemia: Secondary | ICD-10-CM

## 2022-07-21 MED ORDER — GLUCOSE BLOOD VI STRP: ORAL_STRIP | 12 refills | Status: DC

## 2022-07-21 NOTE — Telephone Encounter (Signed)
[name],  Phillip Maynard said a meter was prescribed back  to him in 09-29-2021 , he stated he went to get refills on his strips and when he picked them up they did not work.     I tried today in between patients to try to speak with someone at the pharmacy and was on hold forever and was unable to speak to anyone.      The Patient stated he called the pharmacy when this happened and they stated he needed a new prescription for strips and he just never followed up.      He has not checked his blood sugar since this happened. -- He said it was [facility] on file --[phone].

## 2022-07-21 NOTE — Progress Notes (Signed)
Subjective:   Phillip Maynard. is a 81 y.o. male who presents for Medicare Annual/Subsequent preventive examination.  I connected with  Phillip Maynard. on 07/21/22 by a telephone enabled telemedicine application and verified that I am speaking with the correct person using two identifiers.   I discussed the limitations of evaluation and management by telemedicine. The patient expressed understanding and agreed to proceed.  Patient location: home  Provider location: Tele-health-home    Review of Systems     Cardiac Risk Factors include: advanced age (>6mn, >>53women);diabetes mellitus;male gender;hypertension     Objective:    Today's Vitals   There is no height or weight on file to calculate BMI.     07/21/2022   12:13 PM 03/12/2022    1:41 PM 11/18/2021    1:33 PM 11/16/2021    8:24 AM 10/28/2021   11:23 AM 09/17/2021    1:34 AM 07/15/2021   11:29 AM  Advanced Directives  Does Patient Have a Medical Advance Directive? _0  No No  Would patient like information on creating a medical advance directive? No - Patient declined Yes (ED - Information included in AVS) No - Patient declined No - Patient declined No - Patient declined No - Patient declined No - Patient declined    Current Medications (verified) Outpatient Encounter Medications as of 07/21/2022  Medication Sig   Accu-Chek Softclix Lancets lancets Use as instructed   albuterol (VENTOLIN HFA) 108 (90 Base) MCG/ACT inhaler TAKE 2 PUFFS BY MOUTH EVERY 6 HOURS AS NEEDED FOR WHEEZE OR SHORTNESS OF BREATH   atorvastatin (LIPITOR) 10 MG tablet Take 1 tablet (10 mg total) by mouth daily.   finasteride (PROSCAR) 5 MG tablet Take 1 tablet (5 mg total) by mouth daily.   Fluticasone-Umeclidin-Vilant (TRELEGY ELLIPTA) 100-62.5-25 MCG/ACT AEPB Inhale 1 puff into the lungs daily.   glucose blood test strip Use as instructed   ipratropium-albuterol (DUONEB) 0.5-2.5 (3) MG/3ML SOLN Take 3 mLs by nebulization every 4  (four) hours as needed.   losartan-hydrochlorothiazide (HYZAAR) 50-12.5 MG tablet Take 1 tablet by mouth daily.   metFORMIN (GLUCOPHAGE) 500 MG tablet Take 1 tablet (500 mg total) by mouth 2 (two) times daily with a meal.   montelukast (SINGULAIR) 10 MG tablet Take 1 tablet (10 mg total) by mouth at bedtime.   tamsulosin (FLOMAX) 0.4 MG CAPS capsule Take 2 capsules (0.8 mg total) by mouth at bedtime.   blood glucose meter kit and supplies Dispense based on patient and insurance preference. Use up to four times daily as directed. (FOR ICD-10 E10.9, E11.9). (Patient not taking: Reported on 07/21/2022)   ketotifen (ZADITOR) 0.025 % ophthalmic solution 1 drop 2 (two) times daily. (Patient not taking: Reported on 07/21/2022)   oxyCODONE-acetaminophen (PERCOCET) 5-325 MG tablet Take 1 tablet by mouth every 4 (four) hours as needed for severe pain. (Patient not taking: Reported on 07/15/2022)   prednisoLONE acetate (PRED MILD) 0.12 % ophthalmic suspension 1 drop 1 day or 1 dose. (Patient not taking: Reported on 07/21/2022)   No facility-administered encounter medications on file as of 07/21/2022.    Allergies (verified) Penicillins and Shellfish allergy   History: Past Medical History:  Diagnosis Date   Asthma    "when I was a boy" (04/08/2013)   Bronchitis    Colon polyps    Colovesical fistula    COPD (chronic obstructive pulmonary disease) (HCC)    Diverticulosis    GERD (gastroesophageal reflux disease)  Hepatitis C    treated with injections   High cholesterol    Hypertension    Type II diabetes mellitus (HCC)    hx of . No longer on medicine   Past Surgical History:  Procedure Laterality Date   COLONOSCOPY  last 09/06/2016   CYSTOSCOPY N/A 09/08/2016   Procedure: FIREFLY INJECTIONS;  Surgeon: Cleon Gustin, MD;  Location: [facility];  Service: Urology;  Laterality: N/A;   CYSTOSCOPY W/ RETROGRADES Bilateral 11/18/2021   Procedure: CYSTOSCOPY WITH RETROGRADE PYELOGRAM;  Surgeon:  Cleon Gustin, MD;  Location: [facility];  Service: Urology;  Laterality: Bilateral;   INCISION AND DRAINAGE OF WOUND Right 1970's   "leg" (04/08/2013)   LIVER BIOPSY  ~ 2011   POLYPECTOMY     PROCTOSCOPY N/A 09/08/2016   Procedure: RIGID PROCTOSCOPY;  Surgeon: Michael Boston, MD;  Location: [facility];  Service: General;  Laterality: N/A;   TONSILLECTOMY     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   Procedure: TRANSURETHRAL RESECTION OF BLADDER TUMOR (TURBT);  Surgeon: Cleon Gustin, MD;  Location: [facility];  Service: Urology;  Laterality: N/A;   Family History  Problem Relation Age of Onset   Asthma Mother    Heart attack Mother    Colon polyps Neg Hx    Colon cancer Neg Hx    Esophageal cancer Neg Hx    Rectal cancer Neg Hx    Stomach cancer Neg Hx    Social History   Socioeconomic History   Marital status: Married    Spouse name: Not on file   Number of children: 2   Years of education: Not on file   Highest education level: Not on file  Occupational History   Occupation: truck Education administrator: [employer]    Comment: retired  Tobacco Use   Smoking status: Former    Packs/day: 0.25    Years: 45.00    Total pack years: 11.25    Types: Cigarettes    Quit date: 03/05/2021    Years since quitting: 1.3   Smokeless tobacco: Never  Vaping Use   Vaping Use: Never used  Substance and Sexual Activity   Alcohol use: Yes    Alcohol/week: 12.0 standard drinks of alcohol    Types: 12 Cans of beer per week    Comment: 12 pack a week.   Drug use: No   Sexual activity: Yes  Other Topics Concern   Not on file  Social History Narrative   Pt lives in 1 story home with his wife   Has 2 adult children   Highest level of education: GED & Trade school   Retired Media planner.    Social Determinants of Health   Financial Resource Strain: Low Risk  (07/21/2022)   Overall Financial Resource Strain (CARDIA)    Difficulty of Paying Living Expenses: Not  hard at all  Food Insecurity: No Food Insecurity (07/21/2022)   Hunger Vital Sign    Worried About Running Out of Food in the Last Year: Never true    Ran Out of Food in the Last Year: Never true  Transportation Needs: No Transportation Needs (07/21/2022)   PRAPARE - Hydrologist (Medical): No    Lack of Transportation (Non-Medical): No  Physical Activity: Insufficiently Active (07/21/2022)   Exercise Vital Sign    Days of Exercise per Week: 3 days    Minutes of Exercise per Session: 30 min  Stress: No Stress Concern Present (07/21/2022)   [place]    Feeling of Stress : Not at all  Social Connections: Moderately Isolated (07/21/2022)   Social Connection and Isolation Panel [NHANES]    Frequency of Communication with Friends and Family: More than three times a week    Frequency of Social Gatherings with Friends and Family: Once a week    Attends Religious Services: Never    Marine scientist or Organizations: No    Attends Music therapist: Never    Marital Status: Married    Tobacco Counseling Counseling given: Not Answered   Clinical Intake:  Pre-visit preparation completed: Yes  Pain : No/denies pain     Diabetes: Yes CBG done?: No Did pt. bring in CBG monitor from home?: No  How often do you need to have someone help you when you read instructions, pamphlets, or other written materials from your doctor or pharmacy?: 1 - Never  Diabetic?  Yes  Nutrition Risk Assessment:  Has the patient had any N/V/D within the last 2 months?  No  Does the patient have any non-healing wounds?  No  Has the patient had any unintentional weight loss or weight gain?  No   Diabetes:  Is the patient diabetic?  Yes  If diabetic, was a CBG obtained today?  No  Did the patient bring in their glucometer from home?  No  How often do you monitor your CBG's?  Does not check.    Financial Strains and Diabetes Management:  Are you having any financial strains with the device, your supplies or your medication? No .  Does the patient want to be seen by Chronic Care Management for management of their diabetes?  No  Would the patient like to be referred to a Nutritionist or for Diabetic Management?  No   Diabetic Exams:  Diabetic Eye Exam:Pt has been advised about the importance in completing this exam  Diabetic Foot Exam: Completed . Pt has been advised about the importance in completing this exam. .    Interpreter Needed?: No  Information entered by :: Leroy Kennedy LPN   Activities of Daily Living    07/21/2022   11:57 AM 11/16/2021    8:26 AM  In your present state of health, do you have any difficulty performing the following activities:  Hearing? 0   Vision? 0   Difficulty concentrating or making decisions? 0   Walking or climbing stairs? 0   Dressing or bathing? 0   Doing errands, shopping? 0 0  Preparing Food and eating ? N   Using the Toilet? N   In the past six months, have you accidently leaked urine? N   Do you have problems with loss of bowel control? N   Managing your Medications? N   Managing your Finances? N   Housekeeping or managing your Housekeeping? N     Patient Care Team: Wendie Agreste, MD as PCP - General (Family Medicine) Nickie Retort, MD (Inactive) as Consulting Physician (Urology) Michael Boston, MD as Consulting Physician (General Surgery) Mauri Pole, MD as Consulting Physician (Gastroenterology)  Indicate any recent Medical Services you may have received from other than [facility] providers in the past year (date may be approximate).     Assessment:   This is a routine wellness examination for Phillip Maynard.  Hearing/Vision screen Hearing Screening - Comments:: No trouble hearing Vision Screening - Comments:: Up  to date  [facility]  Dietary issues and exercise activities discussed: Current Exercise Habits: Home  exercise routine, Type of exercise: strength training/weights;walking, Time (Minutes): 30, Frequency (Times/Week): 3, Weekly Exercise (Minutes/Week): 90, Intensity: Mild   Goals Addressed             This Visit's Progress    Patient Stated       Would like to get off metformin       Depression Screen    07/21/2022   11:59 AM 03/21/2022    9:07 AM 12/09/2021   10:33 AM 12/09/2021   10:32 AM 11/15/2021    9:14 AM 09/29/2021    8:57 AM 07/15/2021   11:30 AM  PHQ 2/9 Scores  PHQ - 2 Score 0 0 0 0 0 0 0  PHQ- 9 Score 2 0 0   0     Fall Risk    07/21/2022   11:52 AM 03/21/2022    9:07 AM 11/15/2021    9:14 AM 09/29/2021    8:56 AM 07/15/2021   11:31 AM  Fall Risk   Falls in the past year? 0 0 0 0 0  Number falls in past yr: 0 0 0 0   Injury with Fall? 0 0 0 0 0  Risk for fall due to :  No Fall Risks No Fall Risks No Fall Risks   Follow up Falls evaluation completed;Education provided;Falls prevention discussed Falls evaluation completed Falls evaluation completed Falls evaluation completed Falls evaluation completed    FALL RISK PREVENTION PERTAINING TO THE HOME:  Any stairs in or around the home? No  If so, are there any without handrails? No  Home free of loose throw rugs in walkways, pet beds, electrical cords, etc? Yes  Adequate lighting in your home to reduce risk of falls? Yes   ASSISTIVE DEVICES UTILIZED TO PREVENT FALLS:  Life alert? No  Use of a cane, walker or w/c? No  Grab bars in the bathroom? No  Shower chair or bench in shower? No  Elevated toilet seat or a handicapped toilet? No   TIMED UP AND GO:  Was the test performed? No .    Cognitive Function:      08/04/2017    2:00 PM  Montreal Cognitive Assessment   Visuospatial/ Executive (0/5) 5  Naming (0/3) 3  Attention: Read list of digits (0/2) 2  Attention: Read list of letters (0/1) 1  Attention: Serial 7 subtraction starting at 100 (0/3) 2  Language: Repeat phrase (0/2) 2  Language : Fluency  (0/1) 1  Abstraction (0/2) 2  Delayed Recall (0/5) 5  Orientation (0/6) 6  Total 29      07/21/2022   11:56 AM 12/25/2019    9:16 AM 11/19/2018    3:08 PM 05/02/2017    4:50 PM  6CIT Screen  What Year? 0 points 0 points 0 points 0 points  What month? 0 points 0 points 0 points 0 points  What time? 0 points 0 points 0 points 0 points  Count back from 20 0 points 0 points 0 points 0 points  Months in reverse 0 points 0 points 0 points 0 points  Repeat phrase 2 points 0 points 0 points 0 points  Total Score 2 points 0 points 0 points 0 points    Immunizations Immunization History  Administered Date(s) Administered   Fluad Quad(high Dose 65+) 05/06/2019, 05/04/2020, 05/06/2021   Hepatitis A, Ped/Adol-2 Dose 02/20/2009, 03/26/2009   Hepatitis B, PED/ADOLESCENT 02/19/2009, 03/26/2009   Influenza,  High Dose Seasonal PF 09/05/2018   Influenza,inj,Quad PF,6+ Mos 05/27/2013, 09/22/2014, 05/23/2016   PFIZER(Purple Top)SARS-COV-2 Vaccination 10/06/2019, 10/30/2019, 07/23/2020   Pneumococcal Conjugate-13 01/26/2015   Pneumococcal Polysaccharide-23 01/18/2010   Pneumococcal-Unspecified 04/16/2011   Tdap 06/13/2011, 05/13/2017    TDAP status: Up to date  Flu Vaccine status: Due, Education has been provided regarding the importance of this vaccine. Advised may receive this vaccine at local pharmacy or Health Dept. Aware to provide a copy of the vaccination record if obtained from local pharmacy or Health Dept. Verbalized acceptance and understanding.  Pneumococcal vaccine status: Up to date  Covid-19 vaccine status: Information provided on how to obtain vaccines.   Qualifies for Shingles Vaccine? Yes   Zostavax completed No   Shingrix Completed?: No.    Education has been provided regarding the importance of this vaccine. Patient has been advised to call insurance company to determine out of pocket expense if they have not yet received this vaccine. Advised may also receive vaccine at local  pharmacy or Health Dept. Verbalized acceptance and understanding.  Screening Tests Health Maintenance  Topic Date Due   INFLUENZA VACCINE  03/15/2022   Diabetic kidney evaluation - Urine ACR  04/07/2022   HEMOGLOBIN A1C  06/18/2022   COVID-19 Vaccine (4 - 2023-24 season) 08/06/2022 (Originally 04/15/2022)   Zoster Vaccines- Shingrix (1 of 2) 10/20/2022 (Originally 01/31/1960)   FOOT EXAM  09/29/2022   OPHTHALMOLOGY EXAM  01/14/2023   Diabetic kidney evaluation - GFR measurement  03/22/2023   Medicare Annual Wellness (AWV)  07/22/2023   DTaP/Tdap/Td (3 - Td or Tdap) 05/14/2027   Pneumonia Vaccine 60+ Years old  Completed   Hepatitis C Screening  Completed   HPV VACCINES  Aged Out   COLONOSCOPY (Pts 45-57yr Insurance coverage will need to be confirmed)  Discontinued    Health Maintenance  Health Maintenance Due  Topic Date Due   INFLUENZA VACCINE  03/15/2022   Diabetic kidney evaluation - Urine ACR  04/07/2022   HEMOGLOBIN A1C  06/18/2022    Colorectal cancer screening: No longer required.   Lung Cancer Screening: (Low Dose CT Chest recommended if Age 79-80years, 30 pack-year currently smoking OR have quit w/in 15years.)  qualify.   Lung Cancer Screening Referral:   Additional Screening:  Hepatitis C Screening  chronic Hepatitus that was treated  Vision Screening: Recommended annual ophthalmology exams for early detection of glaucoma and other disorders of the eye. Is the patient up to date with their annual eye exam?  Yes  Who is the provider or what is the name of the office in which the patient attends annual eye exams? [facility] If pt is not established with a provider, would they like to be referred to a provider to establish care? No .   Dental Screening: Recommended annual dental exams for proper oral hygiene  Community Resource Referral / Chronic Care Management: CRR required this visit?  No   CCM required this visit?  No      Plan:     I have personally  reviewed and noted the following in the patient's chart:   Medical and social history Use of alcohol, tobacco or illicit drugs  Current medications and supplements including opioid prescriptions. Patient is not currently taking opioid prescriptions. Functional ability and status Nutritional status Physical activity Advanced directives List of other physicians Hospitalizations, surgeries, and ER visits in previous 12 months Vitals Screenings to include cognitive, depression, and falls Referrals and appointments  In addition, I have reviewed and  discussed with patient certain preventive protocols, quality metrics, and best practice recommendations. A written personalized care plan for preventive services as well as general preventive health recommendations were provided to patient.     Leroy Kennedy, LPN   73/09/2023   Nurse Notes:

## 2022-07-21 NOTE — Telephone Encounter (Signed)
New prescription sent for test trips, recommend he bring his monitor to the pharmacy to make sure he is getting the correct ones prescribed.  Let me know if I can help further.

## 2022-07-21 NOTE — Addendum Note (Signed)
Addended by: [name] R on: 07/21/2022 08:47 PM   Modules accepted: Orders

## 2022-07-21 NOTE — Patient Instructions (Signed)
Phillip Maynard , Thank you for taking time to come for your Medicare Wellness Visit. I appreciate your ongoing commitment to your health goals. Please review the following plan we discussed and let me know if I can assist you in the future.   These are the goals we discussed:  Goals      Exercise 3x per week (30 min per time)     Keep up work with walking and increase cardio     Patient Stated     Would like to get off metformin        This is a list of the screening recommended for you and due dates:  Health Maintenance  Topic Date Due   Flu Shot  03/15/2022   Yearly kidney health urinalysis for diabetes  04/07/2022   Hemoglobin A1C  06/18/2022   COVID-19 Vaccine (4 - 2023-24 season) 08/06/2022*   Zoster (Shingles) Vaccine (1 of 2) 10/20/2022*   Complete foot exam   09/29/2022   Eye exam for diabetics  01/14/2023   Yearly kidney function blood test for diabetes  03/22/2023   Medicare Annual Wellness Visit  07/22/2023   DTaP/Tdap/Td vaccine (3 - Td or Tdap) 05/14/2027   Pneumonia Vaccine  Completed   Hepatitis C Screening: USPSTF Recommendation to screen - Ages 79-79 yo.  Completed   HPV Vaccine  Aged Out   Colon Cancer Screening  Discontinued  *Topic was postponed. The date shown is not the original due date.    Advanced directives: Education provided     Preventive Care 9 Years and Older, Male  Preventive care refers to lifestyle choices and visits with your health care provider that can promote health and wellness. What does preventive care include? A yearly physical exam. This is also called an annual well check. Dental exams once or twice a year. Routine eye exams. Ask your health care provider how often you should have your eyes checked. Personal lifestyle choices, including: Daily care of your teeth and gums. Regular physical activity. Eating a healthy diet. Avoiding tobacco and drug use. Limiting alcohol use. Practicing safe sex. Taking low doses of aspirin  every day. Taking vitamin and mineral supplements as recommended by your health care provider. What happens during an annual well check? The services and screenings done by your health care provider during your annual well check will depend on your age, overall health, lifestyle risk factors, and family history of disease. Counseling  Your health care provider may ask you questions about your: Alcohol use. Tobacco use. Drug use. Emotional well-being. Home and relationship well-being. Sexual activity. Eating habits. History of falls. Memory and ability to understand (cognition). Work and work Statistician. Screening  You may have the following tests or measurements: Height, weight, and BMI. Blood pressure. Lipid and cholesterol levels. These may be checked every 5 years, or more frequently if you are over 68 years old. Skin check. Lung cancer screening. You may have this screening every year starting at age 46 if you have a 30-pack-year history of smoking and currently smoke or have quit within the past 15 years. Fecal occult blood test (FOBT) of the stool. You may have this test every year starting at age 18. Flexible sigmoidoscopy or colonoscopy. You may have a sigmoidoscopy every 5 years or a colonoscopy every 10 years starting at age 74. Prostate cancer screening. Recommendations will vary depending on your family history and other risks. Hepatitis C blood test. Hepatitis B blood test. Sexually transmitted disease (STD) testing. Diabetes  screening. This is done by checking your blood sugar (glucose) after you have not eaten for a while (fasting). You may have this done every 1-3 years. Abdominal aortic aneurysm (AAA) screening. You may need this if you are a current or former smoker. Osteoporosis. You may be screened starting at age 38 if you are at high risk. Talk with your health care provider about your test results, treatment options, and if necessary, the need for more  tests. Vaccines  Your health care provider may recommend certain vaccines, such as: Influenza vaccine. This is recommended every year. Tetanus, diphtheria, and acellular pertussis (Tdap, Td) vaccine. You may need a Td booster every 10 years. Zoster vaccine. You may need this after age 79. Pneumococcal 13-valent conjugate (PCV13) vaccine. One dose is recommended after age 9. Pneumococcal polysaccharide (PPSV23) vaccine. One dose is recommended after age 64. Talk to your health care provider about which screenings and vaccines you need and how often you need them. This information is not intended to replace advice given to you by your health care provider. Make sure you discuss any questions you have with your health care provider. Document Released: 08/28/2015 Document Revised: 04/20/2016 Document Reviewed: 06/02/2015 Elsevier Interactive Patient Education  2017 [employer] Prevention in the Home Falls can cause injuries. They can happen to people of all ages. There are many things you can do to make your home safe and to help prevent falls. What can I do on the outside of my home? Regularly fix the edges of walkways and driveways and fix any cracks. Remove anything that might make you trip as you walk through a door, such as a raised step or threshold. Trim any bushes or trees on the path to your home. Use bright outdoor lighting. Clear any walking paths of anything that might make someone trip, such as rocks or tools. Regularly check to see if handrails are loose or broken. Make sure that both sides of any steps have handrails. Any raised decks and porches should have guardrails on the edges. Have any leaves, snow, or ice cleared regularly. Use sand or salt on walking paths during winter. Clean up any spills in your garage right away. This includes oil or grease spills. What can I do in the bathroom? Use night lights. Install grab bars by the toilet and in the tub and shower.  Do not use towel bars as grab bars. Use non-skid mats or decals in the tub or shower. If you need to sit down in the shower, use a plastic, non-slip stool. Keep the floor dry. Clean up any water that spills on the floor as soon as it happens. Remove soap buildup in the tub or shower regularly. Attach bath mats securely with double-sided non-slip rug tape. Do not have throw rugs and other things on the floor that can make you trip. What can I do in the bedroom? Use night lights. Make sure that you have a light by your bed that is easy to reach. Do not use any sheets or blankets that are too big for your bed. They should not hang down onto the floor. Have a firm chair that has side arms. You can use this for support while you get dressed. Do not have throw rugs and other things on the floor that can make you trip. What can I do in the kitchen? Clean up any spills right away. Avoid walking on wet floors. Keep items that you use a lot in easy-to-reach places.  If you need to reach something above you, use a strong step stool that has a grab bar. Keep electrical cords out of the way. Do not use floor polish or wax that makes floors slippery. If you must use wax, use non-skid floor wax. Do not have throw rugs and other things on the floor that can make you trip. What can I do with my stairs? Do not leave any items on the stairs. Make sure that there are handrails on both sides of the stairs and use them. Fix handrails that are broken or loose. Make sure that handrails are as long as the stairways. Check any carpeting to make sure that it is firmly attached to the stairs. Fix any carpet that is loose or worn. Avoid having throw rugs at the top or bottom of the stairs. If you do have throw rugs, attach them to the floor with carpet tape. Make sure that you have a light switch at the top of the stairs and the bottom of the stairs. If you do not have them, ask someone to add them for you. What else  can I do to help prevent falls? Wear shoes that: Do not have high heels. Have rubber bottoms. Are comfortable and fit you well. Are closed at the toe. Do not wear sandals. If you use a stepladder: Make sure that it is fully opened. Do not climb a closed stepladder. Make sure that both sides of the stepladder are locked into place. Ask someone to hold it for you, if possible. Clearly mark and make sure that you can see: Any grab bars or handrails. First and last steps. Where the edge of each step is. Use tools that help you move around (mobility aids) if they are needed. These include: Canes. Walkers. Scooters. Crutches. Turn on the lights when you go into a dark area. Replace any light bulbs as soon as they burn out. Set up your furniture so you have a clear path. Avoid moving your furniture around. If any of your floors are uneven, fix them. If there are any pets around you, be aware of where they are. Review your medicines with your doctor. Some medicines can make you feel dizzy. This can increase your chance of falling. Ask your doctor what other things that you can do to help prevent falls. This information is not intended to replace advice given to you by your health care provider. Make sure you discuss any questions you have with your health care provider. Document Released: 05/28/2009 Document Revised: 01/07/2016 Document Reviewed: 09/05/2014 Elsevier Interactive Patient Education  2017 [employer].

## 2022-07-22 NOTE — Telephone Encounter (Signed)
Called patient. NA.

## 2022-07-25 NOTE — Telephone Encounter (Signed)
Called patient and again no answer

## 2022-07-26 ENCOUNTER — Encounter: Payer: Self-pay | Admitting: Urology

## 2022-07-26 NOTE — Patient Instructions (Signed)

## 2022-07-28 ENCOUNTER — Other Ambulatory Visit: Payer: Self-pay

## 2022-07-31 ENCOUNTER — Other Ambulatory Visit: Payer: Self-pay | Admitting: Hematology and Oncology

## 2022-07-31 DIAGNOSIS — D7219 Other eosinophilia: Secondary | ICD-10-CM

## 2022-07-31 NOTE — Progress Notes (Unsigned)
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 Lorenso Courier.  Interval History:  Phillip Maynard. 81 y.o. male with medical history significant for elevated eosinophils who presents for a follow up visit. The patient's last visit was on 01/19/2022 at which time he established care. In the interim since the last visit he has had no major changes in his health.  On exam today [name] orts he has been well overall interim since her last visit.  He ran out of his Trelegy inhaler and has been without it for the last 2 weeks.  He notes he has been using his albuterol inhaler instead approximately 2 times per day.  He reports that he is currently undergoing bladder cancer treatment with urology.  He reports he is in the "second phase of treatment".  He notes that his asthma has also been under good control.  He reports that he has not been having any trouble with rash or itch and denies any chest pain.  He notes his energy levels are good and he is able to do yard work without any difficulty.  He is gained about 5 pounds in the interval time and reports he is eating well.  He is not having any trouble with fevers, chills, sweats, nausea, vomiting or diarrhea.  Full 10 point ROS was otherwise negative.  MEDICAL HISTORY:  Past Medical History:  Diagnosis Date   Asthma    "when I was a boy" (04/08/2013)   Bronchitis    Colon polyps    Colovesical fistula    COPD (chronic obstructive pulmonary disease) (HCC)    Diverticulosis    GERD (gastroesophageal reflux disease)    Hepatitis C    treated with  injections   High cholesterol    Hypertension    Type II diabetes mellitus (HCC)    hx of . No longer on medicine    SURGICAL HISTORY: Past Surgical History:  Procedure Laterality Date   COLONOSCOPY  last 09/06/2016   CYSTOSCOPY N/A 09/08/2016   Procedure: FIREFLY INJECTIONS;  Surgeon: Cleon Gustin, MD;  Location: [facility];  Service: Urology;  Laterality: N/A;   CYSTOSCOPY W/ RETROGRADES Bilateral 11/18/2021   Procedure: CYSTOSCOPY WITH RETROGRADE PYELOGRAM;  Surgeon: Cleon Gustin, MD;  Location: [facility];  Service: Urology;  Laterality: Bilateral;   INCISION AND DRAINAGE OF WOUND Right 1970's   "leg" (04/08/2013)   LIVER BIOPSY  ~ 2011   POLYPECTOMY     PROCTOSCOPY N/A 09/08/2016   Procedure: RIGID PROCTOSCOPY;  Surgeon: Michael Boston, MD;  Location: [facility];  Service: General;  Laterality: N/A;   TONSILLECTOMY     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   Procedure: TRANSURETHRAL RESECTION OF BLADDER TUMOR (TURBT);  Surgeon: Cleon Gustin, MD;  Location: [facility];  Service: Urology;  Laterality: N/A;    SOCIAL HISTORY: Social History   Socioeconomic History   Marital status: Married    Spouse name: Not on file   Number of children: 2   Years of education: Not on file   Highest education level: Not on file  Occupational History   Occupation: truck driver    Employer: [employer]  Comment: retired  Tobacco Use   Smoking status: Former    Packs/day: 0.25    Years: 45.00    Total pack years: 11.25    Types: Cigarettes    Quit date: 03/05/2021    Years since quitting: 1.4   Smokeless tobacco: Never  Vaping Use   Vaping Use: Never used  Substance and Sexual Activity   Alcohol use: Yes    Alcohol/week: 12.0 standard drinks of alcohol    Types: 12 Cans of beer per week    Comment: 12 pack a week.   Drug use: No   Sexual activity: Yes  Other Topics Concern   Not on file  Social History Narrative   Pt lives in 1 story home with his wife    Has 2 adult children   Highest level of education: GED & Trade school   Retired Media planner.    Social Determinants of Health   Financial Resource Strain: Low Risk  (07/21/2022)   Overall Financial Resource Strain (CARDIA)    Difficulty of Paying Living Expenses: Not hard at all  Food Insecurity: No Food Insecurity (07/21/2022)   Hunger Vital Sign    Worried About Running Out of Food in the Last Year: Never true    Ran Out of Food in the Last Year: Never true  Transportation Needs: No Transportation Needs (07/21/2022)   PRAPARE - Hydrologist (Medical): No    Lack of Transportation (Non-Medical): No  Physical Activity: Insufficiently Active (07/21/2022)   Exercise Vital Sign    Days of Exercise per Week: 3 days    Minutes of Exercise per Session: 30 min  Stress: No Stress Concern Present (07/21/2022)   [place]    Feeling of Stress : Not at all  Social Connections: Moderately Isolated (07/21/2022)   Social Connection and Isolation Panel [NHANES]    Frequency of Communication with Friends and Family: More than three times a week    Frequency of Social Gatherings with Friends and Family: Once a week    Attends Religious Services: Never    Marine scientist or Organizations: No    Attends Archivist Meetings: Never    Marital Status: Married  Human resources officer Violence: Not At Risk (07/21/2022)   Humiliation, Afraid, Rape, and Kick questionnaire    Fear of Current or Ex-Partner: No    Emotionally Abused: No    Physically Abused: No    Sexually Abused: No    FAMILY HISTORY: Family History  Problem Relation Age of Onset   Asthma Mother    Heart attack Mother    Colon polyps Neg Hx    Colon cancer Neg Hx    Esophageal cancer Neg Hx    Rectal cancer Neg Hx    Stomach cancer Neg Hx     ALLERGIES:  is allergic to penicillins and shellfish  allergy.  MEDICATIONS:  Current Outpatient Medications  Medication Sig Dispense Refill   Accu-Chek Softclix Lancets lancets Use as instructed 100 each 6   albuterol (VENTOLIN HFA) 108 (90 Base) MCG/ACT inhaler TAKE 2 PUFFS BY MOUTH EVERY 6 HOURS AS NEEDED FOR WHEEZE OR SHORTNESS OF BREATH 18 each 1   atorvastatin (LIPITOR) 10 MG tablet Take 1 tablet (10 mg total) by mouth daily. 90 tablet 1   blood glucose meter kit and supplies Dispense based on patient and insurance preference. Use up to four times daily as  directed. (FOR ICD-10 E10.9, E11.9). (Patient not taking: Reported on 07/21/2022) 1 each 0   finasteride (PROSCAR) 5 MG tablet Take 1 tablet (5 mg total) by mouth daily. 90 tablet 3   Fluticasone-Umeclidin-Vilant (TRELEGY ELLIPTA) 100-62.5-25 MCG/ACT AEPB Inhale 1 puff into the lungs daily. 180 each 1   glucose blood test strip Use as instructed up to once per day. Dx.E11.65 100 each 12   ipratropium-albuterol (DUONEB) 0.5-2.5 (3) MG/3ML SOLN Take 3 mLs by nebulization every 4 (four) hours as needed. 360 mL 2   ketotifen (ZADITOR) 0.025 % ophthalmic solution 1 drop 2 (two) times daily. (Patient not taking: Reported on 07/21/2022)     losartan-hydrochlorothiazide (HYZAAR) 50-12.5 MG tablet Take 1 tablet by mouth daily. 90 tablet 1   metFORMIN (GLUCOPHAGE) 500 MG tablet Take 1 tablet (500 mg total) by mouth 2 (two) times daily with a meal. 90 tablet 1   montelukast (SINGULAIR) 10 MG tablet Take 1 tablet (10 mg total) by mouth at bedtime. 30 tablet 11   oxyCODONE-acetaminophen (PERCOCET) 5-325 MG tablet Take 1 tablet by mouth every 4 (four) hours as needed for severe pain. (Patient not taking: Reported on 07/15/2022) 30 tablet 0   prednisoLONE acetate (PRED MILD) 0.12 % ophthalmic suspension 1 drop 1 day or 1 dose. (Patient not taking: Reported on 07/21/2022)     tamsulosin (FLOMAX) 0.4 MG CAPS capsule Take 2 capsules (0.8 mg total) by mouth at bedtime. 60 capsule 11   No current  facility-administered medications for this visit.    REVIEW OF SYSTEMS:   Constitutional: ( - ) fevers, ( - )  chills , ( - ) night sweats Eyes: ( - ) blurriness of vision, ( - ) double vision, ( - ) watery eyes Ears, nose, mouth, throat, and face: ( - ) mucositis, ( - ) sore throat Respiratory: ( - ) cough, ( - ) dyspnea, ( - ) wheezes Cardiovascular: ( - ) palpitation, ( - ) chest discomfort, ( - ) lower extremity swelling Gastrointestinal:  ( - ) nausea, ( - ) heartburn, ( - ) change in bowel habits Skin: ( - ) abnormal skin rashes Lymphatics: ( - ) new lymphadenopathy, ( - ) easy bruising Neurological: ( - ) numbness, ( - ) tingling, ( - ) new weaknesses Behavioral/Psych: ( - ) mood change, ( - ) new changes  All other systems were reviewed with the patient and are negative.  PHYSICAL EXAMINATION:   Vitals:   08/01/22 0859  BP: 134/81  Pulse: 73  Resp: 14  Temp: 97.9 F (36.6 C)  SpO2: 98%   Filed Weights   08/01/22 0859  Weight: 179 lb 8 oz (81.4 kg)    GENERAL: Well-appearing elderly African-American male, alert, no distress and comfortable SKIN: skin color, texture, turgor are normal, no rashes or significant lesions EYES: conjunctiva are pink and non-injected, sclera clear LUNGS: clear to auscultation and percussion with normal breathing effort HEART: regular rate & rhythm and no murmurs and +1 pitting LE edema on left leg. Musculoskeletal: no cyanosis of digits and no clubbing  PSYCH: alert & oriented x 3, fluent speech NEURO: no focal motor/sensory deficits  LABORATORY DATA:  I have reviewed the data as listed    Latest Ref Rng & Units 08/01/2022    8:36 AM 03/12/2022    2:15 PM 01/19/2022   12:28 PM  CBC  WBC 4.0 - 10.5 K/uL 7.4  9.1  8.1   Hemoglobin 13.0 - 17.0 g/dL 14.2  14.3  14.5   Hematocrit 39.0 - 52.0 % 42.4  43.8  43.8   Platelets 150 - 400 K/uL 282  354  247        Latest Ref Rng & Units 08/01/2022    8:36 AM 03/21/2022    9:50 AM 03/12/2022     2:15 PM  CMP  Glucose 70 - 99 mg/dL 178  143  112   BUN 8 - 23 mg/dL _0 Creatinine 0.61 - 1.24 mg/dL 1.16  1.10  1.48   Sodium 135 - 145 mmol/L 136  137  137   Potassium 3.5 - 5.1 mmol/L 3.5  4.0  4.1   Chloride 98 - 111 mmol/L 100  98  101   CO2 22 - 32 mmol/L _1 Calcium 8.9 - 10.3 mg/dL 9.6  9.9  9.7   Total Protein 6.5 - 8.1 g/dL 7.1  8.2  8.6   Total Bilirubin 0.3 - 1.2 mg/dL 0.4  0.8  0.6   Alkaline Phos 38 - 126 U/L 55  59  51   AST 15 - 41 U/L _2 ALT 0 - 44 U/L _3 RADIOGRAPHIC STUDIES: I have personally reviewed the radiological images as listed and agreed with the findings in the report. No results found.  ASSESSMENT & PLAN Jontae Adebayo. 81 y.o. male with medical history significant for elevated eosinophils who presents for a follow up visit.   Previously we reviewed underlying causes for eosinophilia including parasitic infections, hypersensitivity reactions including dermatitis and asthma, and certain malignancies.  He has history of longstanding asthma which is managed by his pulmonologist, Dr. Erin Fulling from [facility]. Upon review of prior labs, there is mild eosinophilia most recently with absolute eosinophil count less than 1.5 K/uL which is very reassuring and not suggestive of hypereosinophilic syndrome.  Prior blood workup showed normal levels of vitamin B12 and tryptase.   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 --Refilled the patient's Trelegy today per his request. --RTC on an as needed basis.  Recommend routine monitoring of his eosinophil count with his primary care provider.    No orders of the defined types were placed in this encounter.   All questions were answered. The patient knows to call the clinic with any problems, questions or  concerns.  A total of more than 25 minutes were spent on this encounter with face-to-face time and non-face-to-face time, including preparing to see the patient, ordering tests and/or medications, counseling the patient and coordination of care as outlined above.   Ledell Peoples, MD Department of Hematology/Oncology [place] [facility] Phone: [phone] Pager: [phone] Email: [email or web address]  08/01/2022 9:38 AM

## 2022-08-01 ENCOUNTER — Inpatient Hospital Stay: Payer: Medicare (Managed Care) | Attending: Hematology and Oncology

## 2022-08-01 ENCOUNTER — Inpatient Hospital Stay (HOSPITAL_BASED_OUTPATIENT_CLINIC_OR_DEPARTMENT_OTHER): Payer: Medicare (Managed Care) | Admitting: Hematology and Oncology

## 2022-08-01 ENCOUNTER — Other Ambulatory Visit: Payer: Self-pay

## 2022-08-01 VITALS — BP 134/81 | HR 73 | Temp 97.9°F | Resp 14 | Wt 179.5 lb

## 2022-08-01 DIAGNOSIS — J4489 Other specified chronic obstructive pulmonary disease: Secondary | ICD-10-CM | POA: Insufficient documentation

## 2022-08-01 DIAGNOSIS — I10 Essential (primary) hypertension: Secondary | ICD-10-CM | POA: Diagnosis not present

## 2022-08-01 DIAGNOSIS — D7219 Other eosinophilia: Secondary | ICD-10-CM

## 2022-08-01 DIAGNOSIS — E119 Type 2 diabetes mellitus without complications: Secondary | ICD-10-CM | POA: Insufficient documentation

## 2022-08-01 DIAGNOSIS — Z87891 Personal history of nicotine dependence: Secondary | ICD-10-CM | POA: Insufficient documentation

## 2022-08-01 DIAGNOSIS — C679 Malignant neoplasm of bladder, unspecified: Secondary | ICD-10-CM | POA: Insufficient documentation

## 2022-08-01 LAB — CBC WITH DIFFERENTIAL (CANCER CENTER ONLY)
Abs Immature Granulocytes: 0 10*3/uL (ref 0.00–0.07)
Basophils Absolute: 0.1 10*3/uL (ref 0.0–0.1)
Basophils Relative: 1 %
Eosinophils Absolute: 1.5 10*3/uL — ABNORMAL HIGH (ref 0.0–0.5)
Eosinophils Relative: 20 %
HCT: 42.4 % (ref 39.0–52.0)
Hemoglobin: 14.2 g/dL (ref 13.0–17.0)
Immature Granulocytes: 0 %
Lymphocytes Relative: 36 %
Lymphs Abs: 2.7 10*3/uL (ref 0.7–4.0)
MCH: 30.3 pg (ref 26.0–34.0)
MCHC: 33.5 g/dL (ref 30.0–36.0)
MCV: 90.4 fL (ref 80.0–100.0)
Monocytes Absolute: 0.6 10*3/uL (ref 0.1–1.0)
Monocytes Relative: 8 %
Neutro Abs: 2.6 10*3/uL (ref 1.7–7.7)
Neutrophils Relative %: 35 %
Platelet Count: 282 10*3/uL (ref 150–400)
RBC: 4.69 MIL/uL (ref 4.22–5.81)
RDW: 13.4 % (ref 11.5–15.5)
WBC Count: 7.4 10*3/uL (ref 4.0–10.5)
nRBC: 0 % (ref 0.0–0.2)

## 2022-08-01 LAB — CMP (CANCER CENTER ONLY)
ALT: 12 U/L (ref 0–44)
AST: 13 U/L — ABNORMAL LOW (ref 15–41)
Albumin: 4 g/dL (ref 3.5–5.0)
Alkaline Phosphatase: 55 U/L (ref 38–126)
Anion gap: 9 (ref 5–15)
BUN: 14 mg/dL (ref 8–23)
CO2: 27 mmol/L (ref 22–32)
Calcium: 9.6 mg/dL (ref 8.9–10.3)
Chloride: 100 mmol/L (ref 98–111)
Creatinine: 1.16 mg/dL (ref 0.61–1.24)
GFR, Estimated: >60 mL/min (ref >60)
Glucose, Bld: 178 mg/dL — ABNORMAL HIGH (ref 70–99)
Potassium: 3.5 mmol/L (ref 3.5–5.1)
Sodium: 136 mmol/L (ref 135–145)
Total Bilirubin: 0.4 mg/dL (ref 0.3–1.2)
Total Protein: 7.1 g/dL (ref 6.5–8.1)

## 2022-08-01 MED ORDER — TRELEGY ELLIPTA 100-62.5-25 MCG/ACT IN AEPB: 1.0000 | INHALATION_SPRAY | Freq: Every day | RESPIRATORY_TRACT | 1 refills | Status: DC

## 2022-08-12 DIAGNOSIS — D721 Eosinophilia, unspecified: Secondary | ICD-10-CM | POA: Diagnosis not present

## 2022-08-12 DIAGNOSIS — J4540 Moderate persistent asthma, uncomplicated: Secondary | ICD-10-CM | POA: Diagnosis not present

## 2022-09-12 DIAGNOSIS — D721 Eosinophilia, unspecified: Secondary | ICD-10-CM | POA: Diagnosis not present

## 2022-09-12 DIAGNOSIS — J4540 Moderate persistent asthma, uncomplicated: Secondary | ICD-10-CM | POA: Diagnosis not present

## 2022-10-03 ENCOUNTER — Other Ambulatory Visit: Payer: Self-pay | Admitting: Family Medicine

## 2022-10-03 DIAGNOSIS — R0609 Other forms of dyspnea: Secondary | ICD-10-CM

## 2022-10-03 DIAGNOSIS — R062 Wheezing: Secondary | ICD-10-CM

## 2022-10-03 DIAGNOSIS — I10 Essential (primary) hypertension: Secondary | ICD-10-CM

## 2022-10-03 DIAGNOSIS — E785 Hyperlipidemia, unspecified: Secondary | ICD-10-CM

## 2022-10-13 DIAGNOSIS — D721 Eosinophilia, unspecified: Secondary | ICD-10-CM | POA: Diagnosis not present

## 2022-10-13 DIAGNOSIS — J4540 Moderate persistent asthma, uncomplicated: Secondary | ICD-10-CM | POA: Diagnosis not present

## 2022-10-19 ENCOUNTER — Ambulatory Visit (INDEPENDENT_AMBULATORY_CARE_PROVIDER_SITE_OTHER): Payer: Medicare (Managed Care) | Admitting: Urology

## 2022-10-19 VITALS — BP 145/77 | HR 59

## 2022-10-19 DIAGNOSIS — Z8551 Personal history of malignant neoplasm of bladder: Secondary | ICD-10-CM

## 2022-10-19 DIAGNOSIS — C679 Malignant neoplasm of bladder, unspecified: Secondary | ICD-10-CM

## 2022-10-19 LAB — URINALYSIS, ROUTINE W REFLEX MICROSCOPIC
Bilirubin, UA: NEGATIVE
Glucose, UA: NEGATIVE
Ketones, UA: NEGATIVE
Leukocytes,UA: NEGATIVE
Nitrite, UA: NEGATIVE
Protein,UA: NEGATIVE
RBC, UA: NEGATIVE
Specific Gravity, UA: 1.015 (ref 1.005–1.030)
Urobilinogen, Ur: 0.2 mg/dL (ref 0.2–1.0)
pH, UA: 6 (ref 5.0–7.5)

## 2022-10-19 MED ORDER — CIPROFLOXACIN HCL 500 MG PO TABS
500.0000 mg | ORAL_TABLET | Freq: Once | ORAL | Status: AC
  Administered 2022-10-19: 500 mg via ORAL

## 2022-10-19 NOTE — Progress Notes (Unsigned)
   10/19/22  CC: followup bladder cancer   HPI: Phillip Maynard is a 82yo here for followup for bladder cancer Blood pressure (!) 145/77, pulse (!) 59. NED. A&Ox3.   No respiratory distress   Abd soft, NT, ND Normal phallus with bilateral descended testicles  Cystoscopy Procedure Note  Patient identification was confirmed, informed consent was obtained, and patient was prepped using Betadine solution.  Lidocaine jelly was administered per urethral meatus.     Pre-Procedure: - Inspection reveals a normal caliber ureteral meatus.  Procedure: The flexible cystoscope was introduced without difficulty - No urethral strictures/lesions are present. - Enlarged prostate  - Normal bladder neck - Bilateral ureteral orifices identified - Bladder mucosa  reveals no ulcers, tumors, or lesions - No bladder stones - No trabeculation   Post-Procedure: - Patient tolerated the procedure well  Assessment/ Plan: Urine for cytology Followup 3 months for cystosocpy  No follow-ups on file.  Nicolette Bang, MD

## 2022-10-20 ENCOUNTER — Encounter: Payer: Self-pay | Admitting: Urology

## 2022-10-20 LAB — CYTOLOGY, URINE

## 2022-10-20 NOTE — Patient Instructions (Signed)

## 2022-10-24 ENCOUNTER — Telehealth: Payer: Self-pay

## 2022-10-24 NOTE — Telephone Encounter (Signed)
Received call from [place] at [facility] on Abnormal Cytology report. Sent on 03/07.

## 2022-10-25 NOTE — Telephone Encounter (Signed)
Appointment made Patient called and made aware. 

## 2022-11-07 DIAGNOSIS — H04123 Dry eye syndrome of bilateral lacrimal glands: Secondary | ICD-10-CM | POA: Diagnosis not present

## 2022-11-07 DIAGNOSIS — H02206 Unspecified lagophthalmos left eye, unspecified eyelid: Secondary | ICD-10-CM | POA: Diagnosis not present

## 2022-11-07 DIAGNOSIS — H5213 Myopia, bilateral: Secondary | ICD-10-CM | POA: Diagnosis not present

## 2022-11-07 DIAGNOSIS — H02203 Unspecified lagophthalmos right eye, unspecified eyelid: Secondary | ICD-10-CM | POA: Diagnosis not present

## 2022-11-07 DIAGNOSIS — H25811 Combined forms of age-related cataract, right eye: Secondary | ICD-10-CM | POA: Diagnosis not present

## 2022-11-07 DIAGNOSIS — Z961 Presence of intraocular lens: Secondary | ICD-10-CM | POA: Diagnosis not present

## 2022-11-07 LAB — HM DIABETES EYE EXAM

## 2022-11-11 DIAGNOSIS — D721 Eosinophilia, unspecified: Secondary | ICD-10-CM | POA: Diagnosis not present

## 2022-11-11 DIAGNOSIS — J4540 Moderate persistent asthma, uncomplicated: Secondary | ICD-10-CM | POA: Diagnosis not present

## 2022-11-21 ENCOUNTER — Ambulatory Visit (INDEPENDENT_AMBULATORY_CARE_PROVIDER_SITE_OTHER): Payer: Medicare (Managed Care) | Admitting: Urology

## 2022-11-21 VITALS — BP 129/88 | HR 55

## 2022-11-21 DIAGNOSIS — C679 Malignant neoplasm of bladder, unspecified: Secondary | ICD-10-CM

## 2022-11-21 LAB — URINALYSIS, ROUTINE W REFLEX MICROSCOPIC
Bilirubin, UA: NEGATIVE
Glucose, UA: NEGATIVE
Ketones, UA: NEGATIVE
Leukocytes,UA: NEGATIVE
Nitrite, UA: NEGATIVE
RBC, UA: NEGATIVE
Specific Gravity, UA: 1.02 (ref 1.005–1.030)
Urobilinogen, Ur: 0.2 mg/dL (ref 0.2–1.0)
pH, UA: 5.5 (ref 5.0–7.5)

## 2022-11-21 NOTE — Progress Notes (Unsigned)
11/21/2022 2:37 PM   Phillip Maynard. [birth year] [id number]  Referring provider: Shade Flood, MD [address] [place],  [place] [address]  No chief complaint on file.   HPI:    PMH: Past Medical History:  Diagnosis Date   Asthma    "when I was a boy" (04/08/2013)   Bronchitis    Colon polyps    Colovesical fistula    COPD (chronic obstructive pulmonary disease) (HCC)    Diverticulosis    GERD (gastroesophageal reflux disease)    Hepatitis C    treated with injections   High cholesterol    Hypertension    Type II diabetes mellitus (HCC)    hx of . No longer on medicine    Surgical History: Past Surgical History:  Procedure Laterality Date   COLONOSCOPY  last 09/06/2016   CYSTOSCOPY N/A 09/08/2016   Procedure: FIREFLY INJECTIONS;  Surgeon: Malen Gauze, MD;  Location: [facility];  Service: Urology;  Laterality: N/A;   CYSTOSCOPY W/ RETROGRADES Bilateral 11/18/2021   Procedure: CYSTOSCOPY WITH RETROGRADE PYELOGRAM;  Surgeon: Malen Gauze, MD;  Location: [facility];  Service: Urology;  Laterality: Bilateral;   INCISION AND DRAINAGE OF WOUND Right 1970's   "leg" (04/08/2013)   LIVER BIOPSY  ~ 2011   POLYPECTOMY     PROCTOSCOPY N/A 09/08/2016   Procedure: RIGID PROCTOSCOPY;  Surgeon: Karie Soda, MD;  Location: [facility];  Service: General;  Laterality: N/A;   TONSILLECTOMY     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   Procedure: TRANSURETHRAL RESECTION OF BLADDER TUMOR (TURBT);  Surgeon: Malen Gauze, MD;  Location: [facility];  Service: Urology;  Laterality: N/A;    Home Medications:  Allergies as of 11/21/2022       Reactions   Penicillins Anaphylaxis, Other (See Comments)   Has patient had a PCN reaction causing immediate rash, facial/tongue/throat swelling, SOB or lightheadedness with hypotension: Yes Has patient had a PCN reaction causing severe rash involving mucus membranes or skin necrosis: No Has patient had a PCN reaction  that required hospitalization No Has patient had a PCN reaction occurring within the last 10 years: No If all of the above answers are "NO", then may proceed with Cephalosporin use.   Shellfish Allergy Anaphylaxis        Medication List        Accurate as of November 21, 2022  2:37 PM. If you have any questions, ask your nurse or doctor.          Accu-Chek Softclix Lancets lancets Use as instructed   albuterol 108 (90 Base) MCG/ACT inhaler Commonly known as: VENTOLIN HFA TAKE 2 PUFFS BY MOUTH EVERY 6 HOURS AS NEEDED FOR WHEEZE OR SHORTNESS OF BREATH   atorvastatin 10 MG tablet Commonly known as: LIPITOR TAKE 1 TABLET BY MOUTH EVERY DAY   blood glucose meter kit and supplies Dispense based on patient and insurance preference. Use up to four times daily as directed. (FOR ICD-10 E10.9, E11.9).   finasteride 5 MG tablet Commonly known as: PROSCAR Take 1 tablet (5 mg total) by mouth daily.   glucose blood test strip Use as instructed up to once per day. Dx.E11.65   ipratropium-albuterol 0.5-2.5 (3) MG/3ML Soln Commonly known as: DUONEB Take 3 mLs by nebulization every 4 (four) hours as needed.   ketotifen 0.025 % ophthalmic solution Commonly known as: ZADITOR 1 drop 2 (two) times daily.   losartan-hydrochlorothiazide 50-12.5 MG tablet Commonly known  as: HYZAAR TAKE 1 TABLET BY MOUTH EVERY DAY   metFORMIN 500 MG tablet Commonly known as: GLUCOPHAGE Take 1 tablet (500 mg total) by mouth 2 (two) times daily with a meal.   montelukast 10 MG tablet Commonly known as: SINGULAIR Take 1 tablet (10 mg total) by mouth at bedtime.   prednisoLONE acetate 0.12 % ophthalmic suspension Commonly known as: PRED MILD 1 drop 1 day or 1 dose.   tamsulosin 0.4 MG Caps capsule Commonly known as: FLOMAX Take 2 capsules (0.8 mg total) by mouth at bedtime.   Trelegy Ellipta 100-62.5-25 MCG/ACT Aepb Generic drug: Fluticasone-Umeclidin-Vilant Inhale 1 puff into the lungs daily.         Allergies:  Allergies  Allergen Reactions   Penicillins Anaphylaxis and Other (See Comments)    Has patient had a PCN reaction causing immediate rash, facial/tongue/throat swelling, SOB or lightheadedness with hypotension: Yes Has patient had a PCN reaction causing severe rash involving mucus membranes or skin necrosis: No Has patient had a PCN reaction that required hospitalization No Has patient had a PCN reaction occurring within the last 10 years: No If all of the above answers are "NO", then may proceed with Cephalosporin use.   Shellfish Allergy Anaphylaxis    Family History: Family History  Problem Relation Age of Onset   Asthma Mother    Heart attack Mother    Colon polyps Neg Hx    Colon cancer Neg Hx    Esophageal cancer Neg Hx    Rectal cancer Neg Hx    Stomach cancer Neg Hx     Social History:  reports that he quit smoking about 20 months ago. His smoking use included cigarettes. He has a 11.25 pack-year smoking history. He has never used smokeless tobacco. He reports current alcohol use of about 12.0 standard drinks of alcohol per week. He reports that he does not use drugs.  ROS: All other review of systems were reviewed and are negative except what is noted above in HPI  Physical Exam: BP 129/88   Pulse (!) 55   Constitutional:  Alert and oriented, No acute distress. HEENT: [place] AT, moist mucus membranes.  Trachea midline, no masses. Cardiovascular: No clubbing, cyanosis, or edema. Respiratory: Normal respiratory effort, no increased work of breathing. GI: Abdomen is soft, nontender, nondistended, no abdominal masses GU: No CVA tenderness.  Lymph: No cervical or inguinal lymphadenopathy. Skin: No rashes, bruises or suspicious lesions. Neurologic: Grossly intact, no focal deficits, moving all 4 extremities. Psychiatric: Normal mood and affect.  Laboratory Data: Lab Results  Component Value Date   WBC 7.4 08/01/2022   HGB 14.2 08/01/2022   HCT  42.4 08/01/2022   MCV 90.4 08/01/2022   PLT 282 08/01/2022    Lab Results  Component Value Date   CREATININE 1.16 08/01/2022    Lab Results  Component Value Date   PSA 0.9 05/23/2016   PSA 3.16 09/23/2015    No results found for: "TESTOSTERONE"  Lab Results  Component Value Date   HGBA1C 7.5 (H) 12/16/2021    Urinalysis    Component Value Date/Time   COLORURINE AMBER (A) 03/12/2022 1416   APPEARANCEUR Clear 10/19/2022 0855   LABSPEC 1.021 03/12/2022 1416   PHURINE 5.0 03/12/2022 1416   GLUCOSEU Negative 10/19/2022 0855   HGBUR LARGE (A) 03/12/2022 1416   BILIRUBINUR Negative 10/19/2022 0855   KETONESUR NEGATIVE 03/12/2022 1416   PROTEINUR Negative 10/19/2022 0855   PROTEINUR 100 (A) 03/12/2022 1416   UROBILINOGEN 0.2 06/23/2016  1441   UROBILINOGEN 1.0 12/17/2013 0528   NITRITE Negative 10/19/2022 0855   NITRITE NEGATIVE 03/12/2022 1416   LEUKOCYTESUR Negative 10/19/2022 0855   LEUKOCYTESUR LARGE (A) 03/12/2022 1416    Lab Results  Component Value Date   LABMICR Comment 10/19/2022   WBCUA >30 (A) 03/14/2022   LABEPIT None seen 03/14/2022   MUCUS Present 03/14/2022   BACTERIA None seen 03/14/2022    Pertinent Imaging: *** No results found for this or any previous visit.  No results found for this or any previous visit.  No results found for this or any previous visit.  No results found for this or any previous visit.  No results found for this or any previous visit.  No valid procedures specified. No results found for this or any previous visit.  Results for orders placed during the hospital encounter of 10/28/21  CT Renal Stone Study  Narrative CLINICAL DATA:  Nephrolithiasis, hematuria.  EXAM: CT ABDOMEN AND PELVIS WITHOUT CONTRAST  TECHNIQUE: Multidetector CT imaging of the abdomen and pelvis was performed following the standard protocol without IV contrast.  RADIATION DOSE REDUCTION: This exam was performed according to  the departmental dose-optimization program which includes automated exposure control, adjustment of the mA and/or kV according to patient size and/or use of iterative reconstruction technique.  COMPARISON:  CT Dec 17, 2013 and January 14, 2016  FINDINGS: Lower chest: Bibasilar atelectasis versus scarring.  Hepatobiliary: No suspicious hepatic lesion on this noncontrast examination. Cholelithiasis without findings of acute cholecystitis. No biliary ductal dilation.  Pancreas: No pancreatic ductal dilation or evidence of acute inflammation.  Spleen: No splenomegaly or focal splenic lesion.  Adrenals/Urinary Tract: Bilateral adrenal glands appear normal.  No hydronephrosis. No renal, ureteral or bladder calculi. Exophytic 1.2 cm left renal cyst.  Endophytic 2.3 cm soft tissue mass extending from the anterior urinary bladder wall on image 69/2.  Stomach/Bowel: No enteric contrast was administered. Stomach is unremarkable for degree of distension. Diverticulum from the third part of the duodenum. No pathologic dilation of small or large bowel. The appendix and terminal ileum appear normal. Colonic diverticulosis without findings of acute diverticulitis. No evidence of acute bowel inflammation.  Vascular/Lymphatic: Aortic atherosclerosis without aneurysmal dilation. No pathologically enlarged abdominal or pelvic lymph nodes.  Reproductive: Heterogeneous enlargement of the prostate gland. Calcifications of the vas deferens and seminal vesicles.  Other: No significant abdominopelvic free fluid.  Musculoskeletal: Advanced multilevel degenerative changes spine. 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 No aggressive lytic or blastic lesion of bone.  IMPRESSION: 1. Endophytic 2.3 cm soft tissue mass extending from the anterior urinary bladder wall, suspicious for bladder neoplasm. Suggest urology consult in further evaluation  with cystoscopy. 2. Heterogeneous enlargement of the prostate gland. 3. Colonic diverticulosis without findings of acute diverticulitis. 4. Cholelithiasis without findings of acute cholecystitis. 5.  Aortic Atherosclerosis (ICD10-I70.0).   Electronically Signed By: Maudry Mayhew M.D. On: 10/28/2021 13:35   Assessment & Plan:    1. Malignant neoplasm of urinary bladder, unspecified site -We will schedule for diagnostic ureteroscopy and selective cytologies.  - Urinalysis, Routine w reflex microscopic   No follow-ups on file.  Wilkie Aye, MD  [facility] [place]

## 2022-11-24 ENCOUNTER — Encounter: Payer: Self-pay | Admitting: Urology

## 2022-11-24 NOTE — Patient Instructions (Signed)

## 2022-11-28 ENCOUNTER — Other Ambulatory Visit: Payer: Self-pay

## 2022-11-28 ENCOUNTER — Other Ambulatory Visit: Payer: Self-pay | Admitting: Family Medicine

## 2022-11-28 DIAGNOSIS — R062 Wheezing: Secondary | ICD-10-CM

## 2022-11-28 DIAGNOSIS — R0609 Other forms of dyspnea: Secondary | ICD-10-CM

## 2022-12-02 ENCOUNTER — Telehealth: Payer: Self-pay

## 2022-12-02 NOTE — Telephone Encounter (Signed)
I spoke with Phillip Maynard. We have discussed possible surgery dates and 05/09 was agreed upon by all parties. Patient given information about surgery date, what to expect pre-operatively and post operatively.    We discussed that a pre-op nurse will be calling to set up the pre-op visit that will take place prior to surgery. Informed patient that our office will communicate any additional care to be provided after surgery.    Patients questions or concerns were discussed during our call. Advised to call our office should there be any additional information, questions or concerns that arise. Patient verbalized understanding.

## 2022-12-12 DIAGNOSIS — J4540 Moderate persistent asthma, uncomplicated: Secondary | ICD-10-CM | POA: Diagnosis not present

## 2022-12-12 DIAGNOSIS — D721 Eosinophilia, unspecified: Secondary | ICD-10-CM | POA: Diagnosis not present

## 2022-12-19 NOTE — Patient Instructions (Signed)
Your procedure is scheduled on: 12/22/2022  Report to [facility] Main Entrance at    10:30 AM.  Call this number if you have problems the morning of surgery: [phone]   Remember:   Do not Eat or Drink after midnight         No Smoking the morning of surgery  :  Take these medicines the morning of surgery with A SIP OF WATER: finasteride and flomax  Use inhalers/nebulizer if needed am of procedure  No diabetic medications am of procedure   Do not wear jewelry, make-up or nail polish.  Do not wear lotions, powders, or perfumes. You may wear deodorant.  Do not shave 48 hours prior to surgery. Men may shave face and neck.  Do not bring valuables to the hospital.  Contacts, dentures or bridgework may not be worn into surgery.  Leave suitcase in the car. After surgery it may be brought to your room.  For patients admitted to the hospital, checkout time is 11:00 AM the day of discharge.   Patients discharged the day of surgery will not be allowed to drive home.    Special Instructions: Shower using CHG night before surgery and shower the day of surgery use CHG.  Use special wash - you have one bottle of CHG for all showers.  You should use approximately 1/2 of the bottle for each shower. How to Use Chlorhexidine Before Surgery Chlorhexidine gluconate (CHG) is a germ-killing (antiseptic) solution that is used to clean the skin. It can get rid of the bacteria that normally live on the skin and can keep them away for about 24 hours. To clean your skin with CHG, you may be given: A CHG solution to use in the shower or as part of a sponge bath. A prepackaged cloth that contains CHG. Cleaning your skin with CHG may help lower the risk for infection: While you are staying in the intensive care unit of the hospital. If you have a vascular access, such as a central line, to provide short-term or long-term access to your veins. If you have a catheter to drain urine from your bladder. If you  are on a ventilator. A ventilator is a machine that helps you breathe by moving air in and out of your lungs. After surgery. What are the risks? Risks of using CHG include: A skin reaction. Hearing loss, if CHG gets in your ears and you have a perforated eardrum. Eye injury, if CHG gets in your eyes and is not rinsed out. The CHG product catching fire. Make sure that you avoid smoking and flames after applying CHG to your skin. Do not use CHG: If you have a chlorhexidine allergy or have previously reacted to chlorhexidine. On babies younger than 79 months of age. How to use CHG solution Use CHG only as told by your health care provider, and follow the instructions on the label. Use the full amount of CHG as directed. Usually, this is one bottle. During a shower Follow these steps when using CHG solution during a shower (unless your health care provider gives you different instructions): Start the shower. Use your normal soap and shampoo to wash your face and hair. Turn off the shower or move out of the shower stream. Pour the CHG onto a clean washcloth. Do not use any type of brush or rough-edged sponge. Starting at your neck, lather your body down to your toes. Make sure you follow these instructions: If you will be having surgery,  pay special attention to the part of your body where you will be having surgery. Scrub this area for at least 1 minute. Do not use CHG on your head or face. If the solution gets into your ears or eyes, rinse them well with water. Avoid your genital area. Avoid any areas of skin that have broken skin, cuts, or scrapes. Scrub your back and under your arms. Make sure to wash skin folds. Let the lather sit on your skin for 1-2 minutes or as long as told by your health care provider. Thoroughly rinse your entire body in the shower. Make sure that all body creases and crevices are rinsed well. Dry off with a clean towel. Do not put any substances on your body  afterward--such as powder, lotion, or perfume--unless you are told to do so by your health care provider. Only use lotions that are recommended by the manufacturer. Put on clean clothes or pajamas. If it is the night before your surgery, sleep in clean sheets.  During a sponge bath Follow these steps when using CHG solution during a sponge bath (unless your health care provider gives you different instructions): Use your normal soap and shampoo to wash your face and hair. Pour the CHG onto a clean washcloth. Starting at your neck, lather your body down to your toes. Make sure you follow these instructions: If you will be having surgery, pay special attention to the part of your body where you will be having surgery. Scrub this area for at least 1 minute. Do not use CHG on your head or face. If the solution gets into your ears or eyes, rinse them well with water. Avoid your genital area. Avoid any areas of skin that have broken skin, cuts, or scrapes. Scrub your back and under your arms. Make sure to wash skin folds. Let the lather sit on your skin for 1-2 minutes or as long as told by your health care provider. Using a different clean, wet washcloth, thoroughly rinse your entire body. Make sure that all body creases and crevices are rinsed well. Dry off with a clean towel. Do not put any substances on your body afterward--such as powder, lotion, or perfume--unless you are told to do so by your health care provider. Only use lotions that are recommended by the manufacturer. Put on clean clothes or pajamas. If it is the night before your surgery, sleep in clean sheets. How to use CHG prepackaged cloths Only use CHG cloths as told by your health care provider, and follow the instructions on the label. Use the CHG cloth on clean, dry skin. Do not use the CHG cloth on your head or face unless your health care provider tells you to. When washing with the CHG cloth: Avoid your genital area. Avoid  any areas of skin that have broken skin, cuts, or scrapes. Before surgery Follow these steps when using a CHG cloth to clean before surgery (unless your health care provider gives you different instructions): Using the CHG cloth, vigorously scrub the part of your body where you will be having surgery. Scrub using a back-and-forth motion for 3 minutes. The area on your body should be completely wet with CHG when you are done scrubbing. Do not rinse. Discard the cloth and let the area air-dry. Do not put any substances on the area afterward, such as powder, lotion, or perfume. Put on clean clothes or pajamas. If it is the night before your surgery, sleep in clean sheets.  For general  bathing Follow these steps when using CHG cloths for general bathing (unless your health care provider gives you different instructions). Use a separate CHG cloth for each area of your body. Make sure you wash between any folds of skin and between your fingers and toes. Wash your body in the following order, switching to a new cloth after each step: The front of your neck, shoulders, and chest. Both of your arms, under your arms, and your hands. Your stomach and groin area, avoiding the genitals. Your right leg and foot. Your left leg and foot. The back of your neck, your back, and your buttocks. Do not rinse. Discard the cloth and let the area air-dry. Do not put any substances on your body afterward--such as powder, lotion, or perfume--unless you are told to do so by your health care provider. Only use lotions that are recommended by the manufacturer. Put on clean clothes or pajamas. Contact a health care provider if: Your skin gets irritated after scrubbing. You have questions about using your solution or cloth. You swallow any chlorhexidine. Call your local poison control center [phone] in the [place] Get help right away if: Your eyes itch badly, or they become very red or swollen. Your skin itches  badly and is red or swollen. Your hearing changes. You have trouble seeing. You have swelling or tingling in your mouth or throat. You have trouble breathing. These symptoms may represent a serious problem that is an emergency. Do not wait to see if the symptoms will go away. Get medical help right away. Call your local emergency services (911 in the [place] Do not drive yourself to the hospital. Summary Chlorhexidine gluconate (CHG) is a germ-killing (antiseptic) solution that is used to clean the skin. Cleaning your skin with CHG may help to lower your risk for infection. You may be given CHG to use for bathing. It may be in a bottle or in a prepackaged cloth to use on your skin. Carefully follow your health care provider's instructions and the instructions on the product label. Do not use CHG if you have a chlorhexidine allergy. Contact your health care provider if your skin gets irritated after scrubbing. This information is not intended to replace advice given to you by your health care provider. Make sure you discuss any questions you have with your health care provider. Document Revised: 11/29/2021 Document Reviewed: 10/12/2020 Elsevier Patient Education  2023 [employer] Transurethral Resection of Bladder Tumor, Care After The following information offers guidance on how to care for yourself after your procedure. Your health care provider may also give you more specific instructions. If you have problems or questions, contact your health care provider. What can I expect after the procedure? After the procedure, it is common to have: A small amount of blood or small blood clots in your urine for up to 2 weeks. Soreness or mild pain from your catheter. After your catheter is removed, you may have mild soreness, especially when urinating. A need to urinate often. Pain in your lower abdomen. Follow these instructions at home: Medicines  Take over-the-counter and prescription medicines  only as told by your health care provider. If you were prescribed an antibiotic medicine, take it as told by your health care provider. Do not stop taking the antibiotic even if you start to feel better. Ask your health care provider if the medicine prescribed to you: Requires you to avoid driving or using machinery. Can cause constipation. You may need to take these actions to  prevent or treat constipation: Drink enough fluid to keep your urine pale yellow. Take over-the-counter or prescription medicines. Eat foods that are high in fiber, such as beans, whole grains, and fresh fruits and vegetables. Limit foods that are high in fat and processed sugars, such as fried or sweet foods. Activity  If you were given a sedative during the procedure, it can affect you for several hours. Do not drive or operate machinery until your health care provider says that it is safe. Rest as told by your health care provider. Avoid sitting for a long time without moving. Get up to take short walks every 1-2 hours. This is important to improve blood flow and breathing. Ask for help if you feel weak or unsteady. Do not lift anything that is heavier than 10 lb (4.5 kg), or the limit that you are told, until your health care provider says that it is safe. Avoid intense physical activity for as long as told by your health care provider. Do not have sex until your health care provider approves. Return to your normal activities as told by your health care provider. Ask your health care provider what activities are safe for you. General instructions If you have a catheter, follow instructions from your health care provider about caring for your catheter and your drainage bag. Do not drink alcohol for as long as told by your health care provider. This is especially important if you are taking prescription pain medicines. Do not use any products that contain nicotine or tobacco. These products include cigarettes, chewing  tobacco, and vaping devices, such as e-cigarettes. If you need help quitting, ask your health care provider. Wear compression stockings as told by your health care provider. These stockings help to prevent blood clots and reduce swelling in your legs. Keep all follow-up visits. This is important. You will need to be followed closely with regular checks of your bladder and urethra (cystoscopies) to make sure that the cancer does not come back. Contact a health care provider if: You have blood in your urine for more than 2 weeks. You become constipated. Signs of constipation may include: Having fewer than three bowel movements in a week. Difficulty having a bowel movement. Stools that are dry, hard, or larger than normal. You have a urinary catheter in place, and you have: Spasms or pain. Problems with your catheter or your catheter is blocked. Your catheter has been taken out but you are unable to urinate. You have signs of infection, such as: Fever or chills. Cloudy or bad-smelling urine. Get help right away if: You have severe abdominal pain that gets worse or does not improve with medicine. You have a lot of large blood clots in your urine. You develop swelling or pain in your leg. You have difficulty breathing. These symptoms may be an emergency. Get help right away. Call 911. Do not wait to see if the symptoms will go away. Do not drive yourself to the hospital. Summary After your procedure, it is common to have a small amount of blood or small blood clots in your urine, soreness or mild pain from your catheter, and pain in your lower abdomen. Take over-the-counter and prescription medicines only as told by your health care provider. Rest as told by your health care provider. Follow your health care provider's instructions about returning to normal activities. Ask what activities are safe for you. If you have a catheter, follow instructions from your health care provider about caring  for your catheter  and your drainage bag. This information is not intended to replace advice given to you by your health care provider. Make sure you discuss any questions you have with your health care provider. Document Revised: 08/06/2021 Document Reviewed: 08/06/2021 Elsevier Patient Education  2023 [employer] Ureteral Stent Implantation, Care After The following information offers guidance on how to care for yourself after your procedure. Your health care provider may also give you more specific instructions. If you have problems or questions, contact your health care provider. What can I expect after the procedure? After the procedure, it is common to have: Nausea. Mild pain when you urinate. You may feel this pain in your lower back or lower abdomen. The pain should stop within a few minutes after you urinate. This pattern may last for up to 1 week. A small amount of blood in your urine for several days. Follow these instructions at home: Medicines Take over-the-counter and prescription medicines only as told by your health care provider. If you were prescribed antibiotics, take them as told by your health care provider. Do not stop using the antibiotic even if you start to feel better. If you were given a sedative during the procedure, it can affect you for several hours. Do not drive or operate machinery until your health care provider says that it is safe. Ask your health care provider if the medicine prescribed to you: Requires you to avoid driving or using machinery. Can cause constipation. You may need to take these actions to prevent or treat constipation: Take over-the-counter or prescription medicines. Eat foods that are high in fiber, such as beans, whole grains, and fresh fruits and vegetables. Limit foods that are high in fat and processed sugars, such as fried or sweet foods. Activity Rest as told by your health care provider. Do not sit for a long time without moving.  Get up to take short walks every 1-2 hours. This will improve blood flow and breathing. Ask for help if you feel weak or unsteady. Return to your normal activities as told by your health care provider. Ask your health care provider what activities are safe for you. General instructions  If you have a catheter: Follow instructions from your health care provider about taking care of your catheter and collection bag. Do not Drink enough fluid to keep your urine pale yellow. Do not use any products that contain nicotine or tobacco. These products include cigarettes, chewing tobacco, and vaping devices, such as e-cigarettes. These can delay healing after surgery. If you need help quitting, ask your health care provider. Keep all follow-up visits. Contact a health care provider if: You start passing blood clots, or you have more than a small amount of blood in your urine. You have pain that gets worse or does not get better with medicine, especially pain when you urinate. You have trouble urinating. You feel nauseous or you vomit again and again during a period of more than 2 days after the procedure. You have a fever. Get help right away if: You are passing blood clots that are 1 inch (2.5 cm) or larger in size. You are leaking urine (have incontinence), or you cannot urinate. The end of the stent comes out of your urethra. You have sudden, sharp, or severe pain in your abdomen or lower back. You have swelling or pain in your legs. You have trouble breathing. These symptoms may be an emergency. Get help right away. Call 911. Do not wait to see if the symptoms  will go away. Do not drive yourself to the hospital. Summary After the procedure, it is common to have mild pain when you urinate that goes away within a few minutes after you urinate. This may last for up to 1 week. Take over-the-counter and prescription medicines only as told by your health care provider. Drink enough fluid to keep your  urine pale yellow. Call your health care provider if you start passing blood clots, or you have more than a small amount of blood in your urine. This information is not intended to replace advice given to you by your health care provider. Make sure you discuss any questions you have with your health care provider. Document Revised: 09/06/2021 Document Reviewed: 09/06/2021 Elsevier Patient Education  2023 [employer]. General Anesthesia, Adult, Care After The following information offers guidance on how to care for yourself after your procedure. Your health care provider may also give you more specific instructions. If you have problems or questions, contact your health care provider. What can I expect after the procedure? After the procedure, it is common for people to: Have pain or discomfort at the IV site. Have nausea or vomiting. Have a sore throat or hoarseness. Have trouble concentrating. Feel cold or chills. Feel weak, sleepy, or tired (fatigue). Have soreness and body aches. These can affect parts of the body that were not involved in surgery. Follow these instructions at home: For the time period you were told by your health care provider:  Rest. Do not participate in activities where you could fall or become injured. Do not drive or use machinery. Do not drink alcohol. Do not take sleeping pills or medicines that cause drowsiness. Do not make important decisions or sign legal documents. Do not take care of children on your own. General instructions Drink enough fluid to keep your urine pale yellow. If you have sleep apnea, surgery and certain medicines can increase your risk for breathing problems. Follow instructions from your health care provider about wearing your sleep device: Anytime you are sleeping, including during daytime naps. While taking prescription pain medicines, sleeping medicines, or medicines that make you drowsy. Return to your normal activities as told  by your health care provider. Ask your health care provider what activities are safe for you. Take over-the-counter and prescription medicines only as told by your health care provider. Do not use any products that contain nicotine or tobacco. These products include cigarettes, chewing tobacco, and vaping devices, such as e-cigarettes. These can delay incision healing after surgery. If you need help quitting, ask your health care provider. Contact a health care provider if: You have nausea or vomiting that does not get better with medicine. You vomit every time you eat or drink. You have pain that does not get better with medicine. You cannot urinate or have bloody urine. You develop a skin rash. You have a fever. Get help right away if: You have trouble breathing. You have chest pain. You vomit blood. These symptoms may be an emergency. Get help right away. Call 911. Do not wait to see if the symptoms will go away. Do not drive yourself to the hospital. Summary After the procedure, it is common to have a sore throat, hoarseness, nausea, vomiting, or to feel weak, sleepy, or fatigue. For the time period you were told by your health care provider, do not drive or use machinery. Get help right away if you have difficulty breathing, have chest pain, or vomit blood. These symptoms may be an  emergency. This information is not intended to replace advice given to you by your health care provider. Make sure you discuss any questions you have with your health care provider. Document Revised: 10/29/2021 Document Reviewed: 10/29/2021 Elsevier Patient Education  2023 [employer].

## 2022-12-20 ENCOUNTER — Encounter (HOSPITAL_COMMUNITY): Payer: Self-pay

## 2022-12-20 ENCOUNTER — Encounter (HOSPITAL_COMMUNITY)
Admission: RE | Admit: 2022-12-20 | Discharge: 2022-12-20 | Disposition: A | Discharge status: Home / Self Care | Payer: Medicare (Managed Care) | Source: Ambulatory Visit | Attending: Urology | Admitting: Urology

## 2022-12-20 ENCOUNTER — Other Ambulatory Visit: Payer: Self-pay

## 2022-12-20 VITALS — BP 114/62 | HR 73 | Temp 97.8°F | Resp 18 | Ht 69.0 in | Wt 179.5 lb

## 2022-12-20 DIAGNOSIS — I10 Essential (primary) hypertension: Secondary | ICD-10-CM

## 2022-12-20 DIAGNOSIS — E1165 Type 2 diabetes mellitus with hyperglycemia: Secondary | ICD-10-CM | POA: Diagnosis not present

## 2022-12-20 DIAGNOSIS — Z01818 Encounter for other preprocedural examination: Secondary | ICD-10-CM | POA: Insufficient documentation

## 2022-12-20 LAB — BASIC METABOLIC PANEL
Anion gap: 11 (ref 5–15)
BUN: 10 mg/dL (ref 8–23)
CO2: 24 mmol/L (ref 22–32)
Calcium: 8.9 mg/dL (ref 8.9–10.3)
Chloride: 99 mmol/L (ref 98–111)
Creatinine, Ser: 1.06 mg/dL (ref 0.61–1.24)
GFR, Estimated: >60 mL/min (ref >60)
Glucose, Bld: 117 mg/dL — ABNORMAL HIGH (ref 70–99)
Potassium: 3.5 mmol/L (ref 3.5–5.1)
Sodium: 134 mmol/L — ABNORMAL LOW (ref 135–145)

## 2022-12-21 MED ORDER — GEMCITABINE CHEMO FOR BLADDER INSTILLATION 2000 MG
2000.0000 mg | Freq: Once | INTRAVENOUS | Status: DC
  Filled 2022-12-21: qty 52.6

## 2022-12-22 ENCOUNTER — Encounter (HOSPITAL_COMMUNITY): Payer: Self-pay | Admitting: Urology

## 2022-12-22 ENCOUNTER — Ambulatory Visit (HOSPITAL_COMMUNITY): Payer: Medicare (Managed Care) | Admitting: Certified Registered"

## 2022-12-22 ENCOUNTER — Encounter (HOSPITAL_COMMUNITY)
Admission: RE | Disposition: A | Discharge status: Home / Self Care | Payer: Self-pay | Source: Home / Self Care | Attending: Urology

## 2022-12-22 ENCOUNTER — Ambulatory Visit (HOSPITAL_COMMUNITY): Payer: Medicare (Managed Care)

## 2022-12-22 ENCOUNTER — Ambulatory Visit (HOSPITAL_BASED_OUTPATIENT_CLINIC_OR_DEPARTMENT_OTHER): Payer: Medicare (Managed Care) | Admitting: Certified Registered"

## 2022-12-22 ENCOUNTER — Ambulatory Visit (HOSPITAL_COMMUNITY)
Admission: RE | Admit: 2022-12-22 | Discharge: 2022-12-22 | Disposition: A | Discharge status: Home / Self Care | Payer: Medicare (Managed Care) | Attending: Urology | Admitting: Urology

## 2022-12-22 DIAGNOSIS — Z79899 Other long term (current) drug therapy: Secondary | ICD-10-CM | POA: Diagnosis not present

## 2022-12-22 DIAGNOSIS — Z7984 Long term (current) use of oral hypoglycemic drugs: Secondary | ICD-10-CM | POA: Diagnosis not present

## 2022-12-22 DIAGNOSIS — D090 Carcinoma in situ of bladder: Secondary | ICD-10-CM

## 2022-12-22 DIAGNOSIS — I10 Essential (primary) hypertension: Secondary | ICD-10-CM | POA: Diagnosis not present

## 2022-12-22 DIAGNOSIS — J449 Chronic obstructive pulmonary disease, unspecified: Secondary | ICD-10-CM | POA: Insufficient documentation

## 2022-12-22 DIAGNOSIS — K219 Gastro-esophageal reflux disease without esophagitis: Secondary | ICD-10-CM | POA: Insufficient documentation

## 2022-12-22 DIAGNOSIS — E1149 Type 2 diabetes mellitus with other diabetic neurological complication: Secondary | ICD-10-CM | POA: Diagnosis not present

## 2022-12-22 DIAGNOSIS — I700 Atherosclerosis of aorta: Secondary | ICD-10-CM | POA: Insufficient documentation

## 2022-12-22 DIAGNOSIS — E119 Type 2 diabetes mellitus without complications: Secondary | ICD-10-CM | POA: Diagnosis not present

## 2022-12-22 DIAGNOSIS — Z87891 Personal history of nicotine dependence: Secondary | ICD-10-CM | POA: Insufficient documentation

## 2022-12-22 DIAGNOSIS — C679 Malignant neoplasm of bladder, unspecified: Secondary | ICD-10-CM | POA: Insufficient documentation

## 2022-12-22 DIAGNOSIS — D494 Neoplasm of unspecified behavior of bladder: Secondary | ICD-10-CM | POA: Diagnosis not present

## 2022-12-22 DIAGNOSIS — B1920 Unspecified viral hepatitis C without hepatic coma: Secondary | ICD-10-CM | POA: Insufficient documentation

## 2022-12-22 DIAGNOSIS — E7800 Pure hypercholesterolemia, unspecified: Secondary | ICD-10-CM | POA: Insufficient documentation

## 2022-12-22 HISTORY — PX: CYSTOSCOPY WITH RETROGRADE PYELOGRAM, URETEROSCOPY AND STENT PLACEMENT: SHX5789

## 2022-12-22 HISTORY — PX: URETERAL BIOPSY: SHX6688

## 2022-12-22 HISTORY — PX: TRANSURETHRAL RESECTION OF BLADDER TUMOR: SHX2575

## 2022-12-22 LAB — GLUCOSE, CAPILLARY
Glucose-Capillary: 113 mg/dL — ABNORMAL HIGH (ref 70–99)
Glucose-Capillary: 138 mg/dL — ABNORMAL HIGH (ref 70–99)

## 2022-12-22 SURGERY — CYSTOURETEROSCOPY, WITH RETROGRADE PYELOGRAM AND STENT INSERTION
Anesthesia: General | Site: Ureter

## 2022-12-22 MED ORDER — OXYCODONE HCL 5 MG PO TABS: 5.0000 mg | ORAL_TABLET | Freq: Once | ORAL | Status: DC | PRN

## 2022-12-22 MED ORDER — DIATRIZOATE MEGLUMINE 30 % UR SOLN
URETHRAL | Status: DC | PRN
  Administered 2022-12-22: 20 mL via URETHRAL

## 2022-12-22 MED ORDER — ROCURONIUM BROMIDE 10 MG/ML (PF) SYRINGE
PREFILLED_SYRINGE | INTRAVENOUS | Status: AC
  Filled 2022-12-22: qty 10

## 2022-12-22 MED ORDER — PHENYLEPHRINE 80 MCG/ML (10ML) SYRINGE FOR IV PUSH (FOR BLOOD PRESSURE SUPPORT)
PREFILLED_SYRINGE | INTRAVENOUS | Status: AC
  Filled 2022-12-22: qty 10

## 2022-12-22 MED ORDER — FENTANYL CITRATE (PF) 100 MCG/2ML IJ SOLN
INTRAMUSCULAR | Status: DC | PRN
  Administered 2022-12-22: 50 ug via INTRAVENOUS
  Administered 2022-12-22 (×2): 25 ug via INTRAVENOUS

## 2022-12-22 MED ORDER — LACTATED RINGERS IV SOLN: INTRAVENOUS | Status: DC

## 2022-12-22 MED ORDER — PHENYLEPHRINE HCL (PRESSORS) 10 MG/ML IV SOLN
INTRAVENOUS | Status: DC | PRN
  Administered 2022-12-22: 80 ug via INTRAVENOUS

## 2022-12-22 MED ORDER — SUGAMMADEX SODIUM 200 MG/2ML IV SOLN
INTRAVENOUS | Status: DC | PRN
  Administered 2022-12-22: 325 mg via INTRAVENOUS

## 2022-12-22 MED ORDER — GEMCITABINE CHEMO FOR BLADDER INSTILLATION 2000 MG
INTRAVENOUS | Status: DC | PRN
  Administered 2022-12-22: 2000 mg via INTRAVESICAL

## 2022-12-22 MED ORDER — DIATRIZOATE MEGLUMINE 30 % UR SOLN
URETHRAL | Status: AC
  Filled 2022-12-22: qty 100

## 2022-12-22 MED ORDER — SODIUM CHLORIDE 0.9 % IR SOLN
Status: DC | PRN
  Administered 2022-12-22 (×2): 3000 mL

## 2022-12-22 MED ORDER — GENTAMICIN SULFATE 40 MG/ML IJ SOLN
5.0000 mg/kg | INTRAVENOUS | Status: AC
  Administered 2022-12-22: 380 mg via INTRAVENOUS
  Filled 2022-12-22: qty 9.5

## 2022-12-22 MED ORDER — FENTANYL CITRATE PF 50 MCG/ML IJ SOSY: 25.0000 ug | PREFILLED_SYRINGE | INTRAMUSCULAR | Status: DC | PRN

## 2022-12-22 MED ORDER — ROCURONIUM BROMIDE 10 MG/ML (PF) SYRINGE
PREFILLED_SYRINGE | INTRAVENOUS | Status: DC | PRN
  Administered 2022-12-22: 50 mg via INTRAVENOUS

## 2022-12-22 MED ORDER — LIDOCAINE HCL (PF) 2 % IJ SOLN
INTRAMUSCULAR | Status: AC
  Filled 2022-12-22: qty 5

## 2022-12-22 MED ORDER — ORAL CARE MOUTH RINSE: 15.0000 mL | Freq: Once | OROMUCOSAL | Status: DC

## 2022-12-22 MED ORDER — CHLORHEXIDINE GLUCONATE 0.12 % MT SOLN: 15.0000 mL | Freq: Once | OROMUCOSAL | Status: DC

## 2022-12-22 MED ORDER — ONDANSETRON HCL 4 MG/2ML IJ SOLN
INTRAMUSCULAR | Status: AC
  Filled 2022-12-22: qty 2

## 2022-12-22 MED ORDER — PROPOFOL 10 MG/ML IV BOLUS
INTRAVENOUS | Status: DC | PRN
  Administered 2022-12-22: 180 mg via INTRAVENOUS

## 2022-12-22 MED ORDER — FENTANYL CITRATE (PF) 100 MCG/2ML IJ SOLN
INTRAMUSCULAR | Status: AC
  Filled 2022-12-22: qty 2

## 2022-12-22 MED ORDER — OXYCODONE HCL 5 MG/5ML PO SOLN: 5.0000 mg | Freq: Once | ORAL | Status: DC | PRN

## 2022-12-22 MED ORDER — DEXAMETHASONE SODIUM PHOSPHATE 10 MG/ML IJ SOLN
INTRAMUSCULAR | Status: DC | PRN
  Administered 2022-12-22: 4 mg via INTRAVENOUS

## 2022-12-22 MED ORDER — LACTATED RINGERS IV SOLN: INTRAVENOUS | Status: DC | PRN

## 2022-12-22 MED ORDER — LIDOCAINE HCL (PF) 2 % IJ SOLN
INTRAMUSCULAR | Status: DC | PRN
  Administered 2022-12-22: 80 mg via INTRADERMAL

## 2022-12-22 MED ORDER — ONDANSETRON HCL 4 MG/2ML IJ SOLN: 4.0000 mg | Freq: Once | INTRAMUSCULAR | Status: DC | PRN

## 2022-12-22 MED ORDER — PROPOFOL 10 MG/ML IV BOLUS
INTRAVENOUS | Status: AC
  Filled 2022-12-22: qty 20

## 2022-12-22 MED ORDER — HYDROCODONE-ACETAMINOPHEN 5-325 MG PO TABS: 1.0000 | ORAL_TABLET | Freq: Four times a day (QID) | ORAL | 0 refills | Status: DC | PRN

## 2022-12-22 MED ORDER — ONDANSETRON HCL 4 MG/2ML IJ SOLN
INTRAMUSCULAR | Status: DC | PRN
  Administered 2022-12-22: 4 mg via INTRAVENOUS

## 2022-12-22 SURGICAL SUPPLY — 37 items
BAG DRAIN URO TABLE W/ADPT NS (BAG) ×4 IMPLANT
BAG DRN 8 ADPR NS SKTRN CSTL (BAG) ×3
BAG DRN RND TRDRP ANRFLXCHMBR (UROLOGICAL SUPPLIES) ×3
BAG HAMPER (MISCELLANEOUS) ×4 IMPLANT
BAG URINE DRAIN 2000ML AR STRL (UROLOGICAL SUPPLIES) ×4 IMPLANT
CATH FOLEY 2WAY SLVR 30CC 20FR (CATHETERS) IMPLANT
CATH FOLEY 2WAY SLVR 30CC 22FR (CATHETERS) IMPLANT
CATH FOLEY 3WAY 30CC 22F (CATHETERS) IMPLANT
CATH FOLEY LATEX FREE 22FR (CATHETERS)
CATH FOLEY LF 22FR (CATHETERS) IMPLANT
CATH INTERMIT  6FR 70CM (CATHETERS) ×4 IMPLANT
CLOTH BEACON ORANGE TIMEOUT ST (SAFETY) ×4 IMPLANT
CNTNR URN SCR LID CUP LEK RST (MISCELLANEOUS) IMPLANT
CONT SPEC 4OZ STRL OR WHT (MISCELLANEOUS) ×6
DECANTER SPIKE VIAL GLASS SM (MISCELLANEOUS) ×4 IMPLANT
ELECT LOOP 22F BIPOLAR SML (ELECTROSURGICAL) ×3
ELECTRODE LOOP 22F BIPOLAR SML (ELECTROSURGICAL) ×4 IMPLANT
GLOVE BIO SURGEON STRL SZ8 (GLOVE) ×4 IMPLANT
GLOVE BIOGEL PI IND STRL 7.0 (GLOVE) ×8 IMPLANT
GOWN STRL REUS W/TWL LRG LVL3 (GOWN DISPOSABLE) ×4 IMPLANT
GOWN STRL REUS W/TWL XL LVL3 (GOWN DISPOSABLE) ×4 IMPLANT
GUIDEWIRE STR DUAL SENSOR (WIRE) ×4 IMPLANT
GUIDEWIRE STR ZIPWIRE 035X150 (MISCELLANEOUS) ×4 IMPLANT
IV NS IRRIG 3000ML ARTHROMATIC (IV SOLUTION) ×8 IMPLANT
KIT CHEMO SPILL (MISCELLANEOUS) ×4 IMPLANT
KIT TURNOVER CYSTO (KITS) ×4 IMPLANT
MANIFOLD NEPTUNE II (INSTRUMENTS) ×4 IMPLANT
PACK CYSTO (CUSTOM PROCEDURE TRAY) ×4 IMPLANT
PAD ARMBOARD 7.5X6 YLW CONV (MISCELLANEOUS) ×4 IMPLANT
PLUG CATH AND CAP STER (CATHETERS) IMPLANT
SYR 10ML LL (SYRINGE) ×4 IMPLANT
SYR 30ML LL (SYRINGE) ×4 IMPLANT
SYR CONTROL 10ML LL (SYRINGE) ×4 IMPLANT
SYR TOOMEY IRRIG 70ML (MISCELLANEOUS) ×3
SYRINGE TOOMEY IRRIG 70ML (MISCELLANEOUS) ×4 IMPLANT
TOWEL OR 17X26 4PK STRL BLUE (TOWEL DISPOSABLE) ×4 IMPLANT
WATER STERILE IRR 500ML POUR (IV SOLUTION) ×4 IMPLANT

## 2022-12-22 NOTE — Anesthesia Preprocedure Evaluation (Signed)
Anesthesia Evaluation  Patient identified by MRN, date of birth, ID band Patient awake    Reviewed: Allergy & Precautions, H&P , NPO status , Patient's Chart, lab work & pertinent test results, reviewed documented beta blocker date and time   Airway Mallampati: II  TM Distance: >3 FB Neck ROM: full    Dental no notable dental hx. (+) Partial Lower, Poor Dentition   Pulmonary neg pulmonary ROS, asthma , COPD, former smoker   Pulmonary exam normal breath sounds clear to auscultation       Cardiovascular Exercise Tolerance: Good hypertension, negative cardio ROS  Rhythm:regular Rate:Normal     Neuro/Psych  Neuromuscular disease negative neurological ROS  negative psych ROS   GI/Hepatic negative GI ROS, Neg liver ROS,GERD  ,,(+) Hepatitis -  Endo/Other  negative endocrine ROSdiabetes    Renal/GU negative Renal ROS  negative genitourinary   Musculoskeletal   Abdominal   Peds  Hematology negative hematology ROS (+)   Anesthesia Other Findings   Reproductive/Obstetrics negative OB ROS                             Anesthesia Physical Anesthesia Plan  ASA: 2  Anesthesia Plan: General and General LMA   Post-op Pain Management:    Induction:   PONV Risk Score and Plan: Propofol infusion and Ondansetron  Airway Management Planned:   Additional Equipment:   Intra-op Plan:   Post-operative Plan:   Informed Consent: I have reviewed the patients History and Physical, chart, labs and discussed the procedure including the risks, benefits and alternatives for the proposed anesthesia with the patient or authorized representative who has indicated his/her understanding and acceptance.     Dental Advisory Given  Plan Discussed with: CRNA  Anesthesia Plan Comments:        Anesthesia Quick Evaluation

## 2022-12-22 NOTE — Transfer of Care (Signed)
Immediate Anesthesia Transfer of Care Note  Patient: Phillip Maynard.  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

## 2022-12-22 NOTE — Op Note (Signed)
Preoperative diagnosis: positive urine cytology  Postoperative diagnosis: Same  Procedure: 1 cystoscopy 2. bilateral retrograde pyelography 3.  Intraoperative fluoroscopy, under one hour, with interpretation 4.  transurethral resection of bladder tumor, small 5. Instillation of bladder chemotherapy agent  Attending: Wilkie Aye  Anesthesia: General  Estimated blood loss: Minimal  Drains: 20 French foley  Specimens: right and left renal cytology Bladder biopsies x 3  Antibiotics: ancef  Findings: 1cm erythematous dome lesion Ureteral orifices in normal anatomic location. No hydronephrosis or filling defects in either collecting system  Indications: Patient is a 81 year old male with a history of positive urine cytology.  After discussing treatment options, they decided proceed with bladder biopsy and selective cytologies.  Procedure in detail: The patient was brought to the operating room and a brief timeout was done to ensure correct patient, correct procedure, correct site.  General anesthesia was administered patient was placed in dorsal lithotomy position.  Their genitalia was then prepped and draped in usual sterile fashion.  A rigid 22 French cystoscope was passed in the urethra and the bladder.  Bladder was inspected and we noted 1 scar consistent with previous bladder tumor resection.  the ureteral orifices were in the normal orthotopic locations.  a 6 french ureteral catheter was then instilled into the left ureteral orifice and we then obtained a cytology.  a gentle retrograde was obtained and findings noted above. We then turned our attention to the right side. a 6 french ureteral catheter was then instilled into the right ureteral orifice and we then obtained a cytology.  a gentle retrograde was obtained and findings noted above. We then removed the cystoscope and placed a resectoscope into the bladder. We proceeded to resect the erythematous lesion at the dome. Hemostasis  was then obtained with a bugbee. the bladder was then drained, a 20 French foley was placed and 2g of gemcitibine was installed into the bladder. This concluded the procedure which was well tolerated by patient.  Complications: None  Condition: Stable, extubated, transferred to PACU  Plan: Patient will have his foley drained in 1 hour and then he will be discharged home and will followup in 5 days for voiding trial

## 2022-12-22 NOTE — Anesthesia Procedure Notes (Signed)
Procedure Name: Intubation Date/Time: 12/22/2022 11:52 AM  Performed by: Oletha Cruel, CRNAPre-anesthesia Checklist: Patient identified, Emergency Drugs available, Suction available, Patient being monitored and Timeout performed Patient Re-evaluated:Patient Re-evaluated prior to induction Oxygen Delivery Method: Circle system utilized Preoxygenation: Pre-oxygenation with 100% oxygen Induction Type: IV induction Ventilation: Mask ventilation without difficulty Laryngoscope Size: Mac and 4 Grade View: Grade II Tube type: Oral Endobronchial tube: Right Tube size: 7.0 mm Number of attempts: 1 Airway Equipment and Method: Stylet Placement Confirmation: ETT inserted through vocal cords under direct vision, positive ETCO2, CO2 detector and breath sounds checked- equal and bilateral Secured at: 22 cm Tube secured with: Tape Dental Injury: Teeth and Oropharynx as per pre-operative assessment

## 2022-12-22 NOTE — Discharge Instructions (Signed)
Plan: Patient will be discharged home and will followup in the office in 5 days for voiding trial.

## 2022-12-22 NOTE — H&P (Signed)
HPI: Phillip Maynard is a 81yo here for followup for bladder cancer. His last cystoscopy was negative to visible tumor. His urine cytology at that time was positive for malignancy. He denies any worsening LUTS. No hematuria or dysuria. No other complaints today     PMH:     Past Medical History:  Diagnosis Date   Asthma      "when I was a boy" (04/08/2013)   Bronchitis     Colon polyps     Colovesical fistula     COPD (chronic obstructive pulmonary disease) (HCC)     Diverticulosis     GERD (gastroesophageal reflux disease)     Hepatitis C      treated with injections   High cholesterol     Hypertension     Type II diabetes mellitus (HCC)      hx of . No longer on medicine      Surgical History:      Past Surgical History:  Procedure Laterality Date   COLONOSCOPY   last 09/06/2016   CYSTOSCOPY N/A 09/08/2016    Procedure: FIREFLY INJECTIONS;  Surgeon: Malen Gauze, MD;  Location: [facility];  Service: Urology;  Laterality: N/A;   CYSTOSCOPY W/ RETROGRADES Bilateral 11/18/2021    Procedure: CYSTOSCOPY WITH RETROGRADE PYELOGRAM;  Surgeon: Malen Gauze, MD;  Location: [facility];  Service: Urology;  Laterality: Bilateral;   INCISION AND DRAINAGE OF WOUND Right 1970's    "leg" (04/08/2013)   LIVER BIOPSY   ~ 2011   POLYPECTOMY       PROCTOSCOPY N/A 09/08/2016    Procedure: RIGID PROCTOSCOPY;  Surgeon: Karie Soda, MD;  Location: [facility];  Service: General;  Laterality: N/A;   TONSILLECTOMY       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    Procedure: TRANSURETHRAL RESECTION OF BLADDER TUMOR (TURBT);  Surgeon: Malen Gauze, MD;  Location: [facility];  Service: Urology;  Laterality: N/A;      Home Medications:  Allergies as of 11/21/2022         Reactions    Penicillins Anaphylaxis, Other (See Comments)    Has patient had a PCN reaction causing immediate rash, facial/tongue/throat swelling, SOB or lightheadedness with hypotension: Yes Has patient had a PCN reaction  causing severe rash involving mucus membranes or skin necrosis: No Has patient had a PCN reaction that required hospitalization No Has patient had a PCN reaction occurring within the last 10 years: No If all of the above answers are "NO", then may proceed with Cephalosporin use.    Shellfish Allergy Anaphylaxis            Medication List           Accurate as of November 21, 2022  2:37 PM. If you have any questions, ask your nurse or doctor.              Accu-Chek Softclix Lancets lancets Use as instructed    albuterol 108 (90 Base) MCG/ACT inhaler Commonly known as: VENTOLIN HFA TAKE 2 PUFFS BY MOUTH EVERY 6 HOURS AS NEEDED FOR WHEEZE OR SHORTNESS OF BREATH    atorvastatin 10 MG tablet Commonly known as: LIPITOR TAKE 1 TABLET BY MOUTH EVERY DAY    blood glucose meter kit and supplies Dispense based on patient and insurance preference. Use up to four times daily as directed. (FOR ICD-10 E10.9, E11.9).    finasteride 5 MG tablet Commonly known as: PROSCAR Take 1 tablet (5 mg total) by  mouth daily.    glucose blood test strip Use as instructed up to once per day. Dx.E11.65    ipratropium-albuterol 0.5-2.5 (3) MG/3ML Soln Commonly known as: DUONEB Take 3 mLs by nebulization every 4 (four) hours as needed.    ketotifen 0.025 % ophthalmic solution Commonly known as: ZADITOR 1 drop 2 (two) times daily.    losartan-hydrochlorothiazide 50-12.5 MG tablet Commonly known as: HYZAAR TAKE 1 TABLET BY MOUTH EVERY DAY    metFORMIN 500 MG tablet Commonly known as: GLUCOPHAGE Take 1 tablet (500 mg total) by mouth 2 (two) times daily with a meal.    montelukast 10 MG tablet Commonly known as: SINGULAIR Take 1 tablet (10 mg total) by mouth at bedtime.    prednisoLONE acetate 0.12 % ophthalmic suspension Commonly known as: PRED MILD 1 drop 1 day or 1 dose.    tamsulosin 0.4 MG Caps capsule Commonly known as: FLOMAX Take 2 capsules (0.8 mg total) by mouth at bedtime.     Trelegy Ellipta 100-62.5-25 MCG/ACT Aepb Generic drug: Fluticasone-Umeclidin-Vilant Inhale 1 puff into the lungs daily.             Allergies:       Allergies  Allergen Reactions   Penicillins Anaphylaxis and Other (See Comments)      Has patient had a PCN reaction causing immediate rash, facial/tongue/throat swelling, SOB or lightheadedness with hypotension: Yes Has patient had a PCN reaction causing severe rash involving mucus membranes or skin necrosis: No Has patient had a PCN reaction that required hospitalization No Has patient had a PCN reaction occurring within the last 10 years: No If all of the above answers are "NO", then may proceed with Cephalosporin use.   Shellfish Allergy Anaphylaxis      Family History:      Family History  Problem Relation Age of Onset   Asthma Mother     Heart attack Mother     Colon polyps Neg Hx     Colon cancer Neg Hx     Esophageal cancer Neg Hx     Rectal cancer Neg Hx     Stomach cancer Neg Hx        Social History:  reports that he quit smoking about 20 months ago. His smoking use included cigarettes. He has a 11.25 pack-year smoking history. He has never used smokeless tobacco. He reports current alcohol use of about 12.0 standard drinks of alcohol per week. He reports that he does not use drugs.   ROS: All other review of systems were reviewed and are negative except what is noted above in HPI   Physical Exam: BP 129/88   Pulse (!) 55   Constitutional:  Alert and oriented, No acute distress. HEENT: [place] AT, moist mucus membranes.  Trachea midline, no masses. Cardiovascular: No clubbing, cyanosis, or edema. Respiratory: Normal respiratory effort, no increased work of breathing. GI: Abdomen is soft, nontender, nondistended, no abdominal masses GU: No CVA tenderness.  Lymph: No cervical or inguinal lymphadenopathy. Skin: No rashes, bruises or suspicious lesions. Neurologic: Grossly intact, no focal deficits, moving all 4  extremities. Psychiatric: Normal mood and affect.   Laboratory Data: Recent Labs       Lab Results  Component Value Date    WBC 7.4 08/01/2022    HGB 14.2 08/01/2022    HCT 42.4 08/01/2022    MCV 90.4 08/01/2022    PLT 282 08/01/2022        Recent Labs  Lab Results  Component Value Date    CREATININE 1.16 08/01/2022        Recent Labs       Lab Results  Component Value Date    PSA 0.9 05/23/2016    PSA 3.16 09/23/2015        Recent Labs  No results found for: "TESTOSTERONE"     Recent Labs       Lab Results  Component Value Date    HGBA1C 7.5 (H) 12/16/2021        Urinalysis Labs (Brief)          Component Value Date/Time    COLORURINE AMBER (A) 03/12/2022 1416    APPEARANCEUR Clear 10/19/2022 0855    LABSPEC 1.021 03/12/2022 1416    PHURINE 5.0 03/12/2022 1416    GLUCOSEU Negative 10/19/2022 0855    HGBUR LARGE (A) 03/12/2022 1416    BILIRUBINUR Negative 10/19/2022 0855    KETONESUR NEGATIVE 03/12/2022 1416    PROTEINUR Negative 10/19/2022 0855    PROTEINUR 100 (A) 03/12/2022 1416    UROBILINOGEN 0.2 06/23/2016 1441    UROBILINOGEN 1.0 12/17/2013 0528    NITRITE Negative 10/19/2022 0855    NITRITE NEGATIVE 03/12/2022 1416    LEUKOCYTESUR Negative 10/19/2022 0855    LEUKOCYTESUR LARGE (A) 03/12/2022 1416        Recent Labs       Lab Results  Component Value Date    LABMICR Comment 10/19/2022    WBCUA >30 (A) 03/14/2022    LABEPIT None seen 03/14/2022    MUCUS Present 03/14/2022    BACTERIA None seen 03/14/2022        Pertinent Imaging:   No results found for this or any previous visit.   No results found for this or any previous visit.   No results found for this or any previous visit.   No results found for this or any previous visit.   No results found for this or any previous visit.   No valid procedures specified. No results found for this or any previous visit.   Results for orders placed during the hospital  encounter of 10/28/21   CT Renal Stone Study   Narrative CLINICAL DATA:  Nephrolithiasis, hematuria.   EXAM: CT ABDOMEN AND PELVIS WITHOUT CONTRAST   TECHNIQUE: Multidetector CT imaging of the abdomen and pelvis was performed following the standard protocol without IV contrast.   RADIATION DOSE REDUCTION: This exam was performed according to the departmental dose-optimization program which includes automated exposure control, adjustment of the mA and/or kV according to patient size and/or use of iterative reconstruction technique.   COMPARISON:  CT Dec 17, 2013 and January 14, 2016   FINDINGS: Lower chest: Bibasilar atelectasis versus scarring.   Hepatobiliary: No suspicious hepatic lesion on this noncontrast examination. Cholelithiasis without findings of acute cholecystitis. No biliary ductal dilation.   Pancreas: No pancreatic ductal dilation or evidence of acute inflammation.   Spleen: No splenomegaly or focal splenic lesion.   Adrenals/Urinary Tract: Bilateral adrenal glands appear normal.   No hydronephrosis. No renal, ureteral or bladder calculi. Exophytic 1.2 cm left renal cyst.   Endophytic 2.3 cm soft tissue mass extending from the anterior urinary bladder wall on image 69/2.   Stomach/Bowel: No enteric contrast was administered. Stomach is unremarkable for degree of distension. Diverticulum from the third part of the duodenum. No pathologic dilation of small or large bowel. The appendix and terminal ileum appear normal. Colonic diverticulosis without findings of acute diverticulitis. No  evidence of acute bowel inflammation.   Vascular/Lymphatic: Aortic atherosclerosis without aneurysmal dilation. No pathologically enlarged abdominal or pelvic lymph nodes.   Reproductive: Heterogeneous enlargement of the prostate gland. Calcifications of the vas deferens and seminal vesicles.   Other: No significant abdominopelvic free fluid.   Musculoskeletal: Advanced  multilevel degenerative changes spine. 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 No aggressive lytic or blastic lesion of bone.   IMPRESSION: 1. Endophytic 2.3 cm soft tissue mass extending from the anterior urinary bladder wall, suspicious for bladder neoplasm. Suggest urology consult in further evaluation with cystoscopy. 2. Heterogeneous enlargement of the prostate gland. 3. Colonic diverticulosis without findings of acute diverticulitis. 4. Cholelithiasis without findings of acute cholecystitis. 5.  Aortic Atherosclerosis (ICD10-I70.0).     Electronically Signed By: Maudry Mayhew M.D. On: 10/28/2021 13:35     Assessment & Plan:     1. Malignant neoplasm of urinary bladder, unspecified site -We discussed the management of positive urine cytologies in patients with a history of bladder cancer. We discussed surveillance versus cystoscopy, diagnostic ureteroscopy and selective cytologies. After discussing the options the patient elects to pursue surgery. We will schedule for diagnostic ureteroscopy and selective cytologies. Risks/benefits/alternatives discussed

## 2022-12-23 LAB — SURGICAL PATHOLOGY

## 2022-12-23 LAB — CYTOLOGY - NON PAP

## 2022-12-24 ENCOUNTER — Emergency Department (HOSPITAL_COMMUNITY)
Admission: EM | Admit: 2022-12-24 | Discharge: 2022-12-24 | Discharge status: Against Medical Advice | Payer: Medicare (Managed Care) | Attending: Emergency Medicine | Admitting: Emergency Medicine

## 2022-12-24 ENCOUNTER — Other Ambulatory Visit: Payer: Self-pay

## 2022-12-24 DIAGNOSIS — Z5321 Procedure and treatment not carried out due to patient leaving prior to being seen by health care provider: Secondary | ICD-10-CM | POA: Diagnosis not present

## 2022-12-24 DIAGNOSIS — Y838 Other surgical procedures as the cause of abnormal reaction of the patient, or of later complication, without mention of misadventure at the time of the procedure: Secondary | ICD-10-CM | POA: Diagnosis not present

## 2022-12-24 DIAGNOSIS — T83098A Other mechanical complication of other indwelling urethral catheter, initial encounter: Secondary | ICD-10-CM | POA: Diagnosis not present

## 2022-12-24 NOTE — Anesthesia Postprocedure Evaluation (Signed)
Anesthesia Post Note  Patient: Phillip Maynard.  Procedure(s) Performed: CYSTOSCOPY WITH RETROGRADE PYELOGRAM (Bilateral: Ureter) URETERAL BIOPSY-selective cytologies (Bilateral) TRANSURETHRAL RESECTION OF BLADDER TUMOR (TURBT) (Bladder)  Patient location during evaluation: Phase II Anesthesia Type: General Level of consciousness: awake Pain management: pain level controlled Vital Signs Assessment: post-procedure vital signs reviewed and stable Respiratory status: spontaneous breathing and respiratory function stable Cardiovascular status: blood pressure returned to baseline and stable Postop Assessment: no headache and no apparent nausea or vomiting Anesthetic complications: no Comments: Late entry   No notable events documented.   Last Vitals:  Vitals:   12/22/22 1330 12/22/22 1339  BP: (!) 177/66 (!) 160/69  Pulse: (!) 58 (!) 59  Resp: 14 16  Temp:  36.8 C  SpO2: 97% 96%    Last Pain:  Vitals:   12/23/22 1437  TempSrc:   PainSc: 0-No pain                 Windell Norfolk

## 2022-12-24 NOTE — ED Triage Notes (Addendum)
Patient reports foley catheter is " pulling" after anchor tape at leg is detached today . Catheter taped to leg using securing device by triage nurse.

## 2022-12-27 ENCOUNTER — Telehealth: Payer: Self-pay

## 2022-12-27 NOTE — Telephone Encounter (Signed)
Patient was instructed to call back this week (5 days) after procedure for a voiding trial with Dr. Ronne Binning.  Please advise.  Call back - [phone]  Thank you.

## 2022-12-28 ENCOUNTER — Encounter (HOSPITAL_COMMUNITY): Payer: Self-pay | Admitting: Urology

## 2022-12-28 NOTE — Telephone Encounter (Signed)
Patient scheduled for NV on 12/29/2022.  Patient aware he will still keep MD follow up on 05/22

## 2022-12-29 ENCOUNTER — Ambulatory Visit (INDEPENDENT_AMBULATORY_CARE_PROVIDER_SITE_OTHER): Payer: Medicare (Managed Care)

## 2022-12-29 DIAGNOSIS — R339 Retention of urine, unspecified: Secondary | ICD-10-CM

## 2022-12-29 DIAGNOSIS — C679 Malignant neoplasm of bladder, unspecified: Secondary | ICD-10-CM

## 2022-12-29 LAB — BLADDER SCAN AMB NON-IMAGING: Scan Result: 60

## 2022-12-29 MED ORDER — CIPROFLOXACIN HCL 500 MG PO TABS
500.0000 mg | ORAL_TABLET | Freq: Once | ORAL | Status: AC
Start: 2022-12-29 — End: 2022-12-29
  Administered 2022-12-29: 500 mg via ORAL

## 2022-12-29 NOTE — Progress Notes (Addendum)
Fill and Pull Catheter Removal  Patient is present today for a catheter removal.  Patient was cleaned and prepped in a sterile fashion 210 ml of sterile water/ saline was instilled into the bladder when the patient felt the urge to urinate. 30ml of water was then drained from the balloon.  A 20FR foley cath was removed from the bladder no complications were noted .  Patient as then given some time to void on their own.  Patient can void  on their own after some time.  Patient tolerated well.  Pt here today for bladder scan. Bladder was scanned and 60 was visualized.   Performed by: Kennyth Lose, CMA  Follow up/ Additional notes: PVR @ 2:30pm

## 2023-01-04 ENCOUNTER — Ambulatory Visit (INDEPENDENT_AMBULATORY_CARE_PROVIDER_SITE_OTHER): Payer: Medicare (Managed Care) | Admitting: Urology

## 2023-01-04 ENCOUNTER — Encounter: Payer: Self-pay | Admitting: Urology

## 2023-01-04 VITALS — BP 128/66 | HR 56

## 2023-01-04 DIAGNOSIS — C679 Malignant neoplasm of bladder, unspecified: Secondary | ICD-10-CM

## 2023-01-04 LAB — URINALYSIS, ROUTINE W REFLEX MICROSCOPIC
Bilirubin, UA: NEGATIVE
Glucose, UA: NEGATIVE
Ketones, UA: NEGATIVE
Nitrite, UA: NEGATIVE
Specific Gravity, UA: 1.025 (ref 1.005–1.030)
Urobilinogen, Ur: 0.2 mg/dL (ref 0.2–1.0)
pH, UA: 5.5 (ref 5.0–7.5)

## 2023-01-04 LAB — MICROSCOPIC EXAMINATION
Bacteria, UA: NONE SEEN
RBC, Urine: >30 /hpf — AB (ref 0–2)

## 2023-01-04 MED ORDER — CIPROFLOXACIN HCL 500 MG PO TABS
500.0000 mg | ORAL_TABLET | Freq: Once | ORAL | Status: DC
Start: 2023-01-04 — End: 2023-01-04

## 2023-01-04 MED ORDER — BCG LIVE 50 MG IS SUSR
3.2400 mL | INTRAVESICAL | Status: AC
Start: 2023-02-07 — End: 2023-03-20
  Administered 2023-02-07 – 2023-02-28 (×4): 81 mg via INTRAVESICAL
  Administered 2023-03-14: 40.5 mg via INTRAVESICAL

## 2023-01-04 NOTE — Progress Notes (Unsigned)
01/04/2023 9:19 AM   Phillip Maynard. [birth year] [id number]  Referring provider: Shade Flood, MD [address] [place],  [place] [address]  Chief Complaint  Patient presents with   stent placement    HPI: [name] is a 82yo here for followup after bladder biopsy and selective urine cytologies. Left ureteral washing suspicious for CIS, bladder biopsy positive for CIS. Right ureteral washing showed atypical cells. No other compalints today   PMH: Past Medical History:  Diagnosis Date   Asthma    "when I was a boy" (04/08/2013)   Bronchitis    Colon polyps    Colovesical fistula    COPD (chronic obstructive pulmonary disease) (HCC)    Diverticulosis    GERD (gastroesophageal reflux disease)    Hepatitis C    treated with injections   High cholesterol    Hypertension    Type II diabetes mellitus (HCC)    hx of . No longer on medicine    Surgical History: Past Surgical History:  Procedure Laterality Date   COLONOSCOPY  last 09/06/2016   CYSTOSCOPY N/A 09/08/2016   Procedure: FIREFLY INJECTIONS;  Surgeon: Phillip Gauze, MD;  Location: [facility];  Service: Urology;  Laterality: N/A;   CYSTOSCOPY W/ RETROGRADES Bilateral 11/18/2021   Procedure: CYSTOSCOPY WITH RETROGRADE PYELOGRAM;  Surgeon: Phillip Gauze, MD;  Location: [facility];  Service: Urology;  Laterality: Bilateral;   CYSTOSCOPY WITH RETROGRADE PYELOGRAM, URETEROSCOPY AND STENT PLACEMENT Bilateral 12/22/2022   Procedure: CYSTOSCOPY WITH RETROGRADE PYELOGRAM;  Surgeon: Phillip Gauze, MD;  Location: [facility];  Service: Urology;  Laterality: Bilateral;   INCISION AND DRAINAGE OF WOUND Right 1970's   "leg" (04/08/2013)   LIVER BIOPSY  ~ 2011   POLYPECTOMY     PROCTOSCOPY N/A 09/08/2016   Procedure: RIGID PROCTOSCOPY;  Surgeon: Phillip Soda, MD;  Location: [facility];  Service: General;  Laterality: N/A;   TONSILLECTOMY     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   Procedure: TRANSURETHRAL  RESECTION OF BLADDER TUMOR (TURBT);  Surgeon: Phillip Gauze, MD;  Location: [facility];  Service: Urology;  Laterality: N/A;   TRANSURETHRAL RESECTION OF BLADDER TUMOR N/A 12/22/2022   Procedure: TRANSURETHRAL RESECTION OF BLADDER TUMOR (TURBT);  Surgeon: Phillip Gauze, MD;  Location: [facility];  Service: Urology;  Laterality: N/A;   URETERAL BIOPSY Bilateral 12/22/2022   Procedure: URETERAL BIOPSY-selective cytologies;  Surgeon: Phillip Gauze, MD;  Location: [facility];  Service: Urology;  Laterality: Bilateral;    Home Medications:  Allergies as of 01/04/2023       Reactions   Penicillins Anaphylaxis, Other (See Comments)   Has patient had a PCN reaction causing immediate rash, facial/tongue/throat swelling, SOB or lightheadedness with hypotension: Yes Has patient had a PCN reaction causing severe rash involving mucus membranes or skin necrosis: No Has patient had a PCN reaction that required hospitalization No Has patient had a PCN reaction occurring within the last 10 years: No If all of the above answers are "NO", then may proceed with Cephalosporin use.   Shellfish Allergy Anaphylaxis   Shrimp  crabs        Medication List        Accurate as of Jan 04, 2023  9:19 AM. If you have any questions, ask your nurse or doctor.          Accu-Chek Softclix Lancets lancets Use as instructed   albuterol 108 (90 Base) MCG/ACT inhaler Commonly known as:  VENTOLIN HFA TAKE 2 PUFFS BY MOUTH EVERY 6 HOURS AS NEEDED FOR WHEEZE OR SHORTNESS OF BREATH   atorvastatin 10 MG tablet Commonly known as: LIPITOR TAKE 1 TABLET BY MOUTH EVERY DAY   blood glucose meter kit and supplies Dispense based on patient and insurance preference. Use up to four times daily as directed. (FOR ICD-10 E10.9, E11.9).   finasteride 5 MG tablet Commonly known as: PROSCAR Take 1 tablet (5 mg total) by mouth daily.   glucose blood test strip Use as instructed up to once per day. Dx.E11.65    HYDROcodone-acetaminophen 5-325 MG tablet Commonly known as: Norco Take 1 tablet by mouth every 6 (six) hours as needed for moderate pain.   ipratropium-albuterol 0.5-2.5 (3) MG/3ML Soln Commonly known as: DUONEB Take 3 mLs by nebulization every 4 (four) hours as needed.   losartan-hydrochlorothiazide 50-12.5 MG tablet Commonly known as: HYZAAR TAKE 1 TABLET BY MOUTH EVERY DAY   metFORMIN 500 MG tablet Commonly known as: GLUCOPHAGE Take 1 tablet (500 mg total) by mouth 2 (two) times daily with a meal. What changed: when to take this   montelukast 10 MG tablet Commonly known as: SINGULAIR Take 1 tablet (10 mg total) by mouth at bedtime.   tamsulosin 0.4 MG Caps capsule Commonly known as: FLOMAX Take 2 capsules (0.8 mg total) by mouth at bedtime. What changed: how much to take   Trelegy Ellipta 100-62.5-25 MCG/ACT Aepb Generic drug: Fluticasone-Umeclidin-Vilant Inhale 1 puff into the lungs daily.        Allergies:  Allergies  Allergen Reactions   Penicillins Anaphylaxis and Other (See Comments)    Has patient had a PCN reaction causing immediate rash, facial/tongue/throat swelling, SOB or lightheadedness with hypotension: Yes Has patient had a PCN reaction causing severe rash involving mucus membranes or skin necrosis: No Has patient had a PCN reaction that required hospitalization No Has patient had a PCN reaction occurring within the last 10 years: No If all of the above answers are "NO", then may proceed with Cephalosporin use.   Shellfish Allergy Anaphylaxis    Shrimp  crabs    Family History: Family History  Problem Relation Age of Onset   Asthma Mother    Heart attack Mother    Colon polyps Neg Hx    Colon cancer Neg Hx    Esophageal cancer Neg Hx    Rectal cancer Neg Hx    Stomach cancer Neg Hx     Social History:  reports that he quit smoking about 22 months ago. His smoking use included cigarettes. He has a 11.25 pack-year smoking history. He has  never used smokeless tobacco. He reports current alcohol use of about 12.0 standard drinks of alcohol per week. He reports that he does not use drugs.  ROS: All other review of systems were reviewed and are negative except what is noted above in HPI  Physical Exam: BP 128/66   Pulse (!) 56   Constitutional:  Alert and oriented, No acute distress. HEENT: [place] AT, moist mucus membranes.  Trachea midline, no masses. Cardiovascular: No clubbing, cyanosis, or edema. Respiratory: Normal respiratory effort, no increased work of breathing. GI: Abdomen is soft, nontender, nondistended, no abdominal masses GU: No CVA tenderness.  Lymph: No cervical or inguinal lymphadenopathy. Skin: No rashes, bruises or suspicious lesions. Neurologic: Grossly intact, no focal deficits, moving all 4 extremities. Psychiatric: Normal mood and affect.  Laboratory Data: Lab Results  Component Value Date   WBC 7.4 08/01/2022   HGB 14.2  08/01/2022   HCT 42.4 08/01/2022   MCV 90.4 08/01/2022   PLT 282 08/01/2022    Lab Results  Component Value Date   CREATININE 1.06 12/20/2022    Lab Results  Component Value Date   PSA 0.9 05/23/2016   PSA 3.16 09/23/2015    No results found for: "TESTOSTERONE"  Lab Results  Component Value Date   HGBA1C 7.5 (H) 12/16/2021    Urinalysis    Component Value Date/Time   COLORURINE AMBER (A) 03/12/2022 1416   APPEARANCEUR Clear 11/21/2022 1424   LABSPEC 1.021 03/12/2022 1416   PHURINE 5.0 03/12/2022 1416   GLUCOSEU Negative 11/21/2022 1424   HGBUR LARGE (A) 03/12/2022 1416   BILIRUBINUR Negative 11/21/2022 1424   KETONESUR NEGATIVE 03/12/2022 1416   PROTEINUR Trace 11/21/2022 1424   PROTEINUR 100 (A) 03/12/2022 1416   UROBILINOGEN 0.2 06/23/2016 1441   UROBILINOGEN 1.0 12/17/2013 0528   NITRITE Negative 11/21/2022 1424   NITRITE NEGATIVE 03/12/2022 1416   LEUKOCYTESUR Negative 11/21/2022 1424   LEUKOCYTESUR LARGE (A) 03/12/2022 1416    Lab Results   Component Value Date   LABMICR Comment 11/21/2022   WBCUA >30 (A) 03/14/2022   LABEPIT None seen 03/14/2022   MUCUS Present 03/14/2022   BACTERIA None seen 03/14/2022    Pertinent Imaging:  No results found for this or any previous visit.  No results found for this or any previous visit.  No results found for this or any previous visit.  No results found for this or any previous visit.  No results found for this or any previous visit.  No valid procedures specified. No results found for this or any previous visit.  Results for orders placed during the hospital encounter of 10/28/21  CT Renal Stone Study  Narrative CLINICAL DATA:  Nephrolithiasis, hematuria.  EXAM: CT ABDOMEN AND PELVIS WITHOUT CONTRAST  TECHNIQUE: Multidetector CT imaging of the abdomen and pelvis was performed following the standard protocol without IV contrast.  RADIATION DOSE REDUCTION: This exam was performed according to the departmental dose-optimization program which includes automated exposure control, adjustment of the mA and/or kV according to patient size and/or use of iterative reconstruction technique.  COMPARISON:  CT Dec 17, 2013 and January 14, 2016  FINDINGS: Lower chest: Bibasilar atelectasis versus scarring.  Hepatobiliary: No suspicious hepatic lesion on this noncontrast examination. Cholelithiasis without findings of acute cholecystitis. No biliary ductal dilation.  Pancreas: No pancreatic ductal dilation or evidence of acute inflammation.  Spleen: No splenomegaly or focal splenic lesion.  Adrenals/Urinary Tract: Bilateral adrenal glands appear normal.  No hydronephrosis. No renal, ureteral or bladder calculi. Exophytic 1.2 cm left renal cyst.  Endophytic 2.3 cm soft tissue mass extending from the anterior urinary bladder wall on image 69/2.  Stomach/Bowel: No enteric contrast was administered. Stomach is unremarkable for degree of distension. Diverticulum from the  third part of the duodenum. No pathologic dilation of small or large bowel. The appendix and terminal ileum appear normal. Colonic diverticulosis without findings of acute diverticulitis. No evidence of acute bowel inflammation.  Vascular/Lymphatic: Aortic atherosclerosis without aneurysmal dilation. No pathologically enlarged abdominal or pelvic lymph nodes.  Reproductive: Heterogeneous enlargement of the prostate gland. Calcifications of the vas deferens and seminal vesicles.  Other: No significant abdominopelvic free fluid.  Musculoskeletal: Advanced multilevel degenerative changes spine. 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 No aggressive lytic or blastic lesion of bone.  IMPRESSION: 1. Endophytic 2.3 cm soft tissue mass extending from  the anterior urinary bladder wall, suspicious for bladder neoplasm. Suggest urology consult in further evaluation with cystoscopy. 2. Heterogeneous enlargement of the prostate gland. 3. Colonic diverticulosis without findings of acute diverticulitis. 4. Cholelithiasis without findings of acute cholecystitis. 5.  Aortic Atherosclerosis (ICD10-I70.0).   Electronically Signed By: Maudry Mayhew M.D. On: 10/28/2021 13:35   Assessment & Plan:    1. 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 After discussing the options the patient elects for BCG therapy. We will schedule to start 6 weeks of BCG in a month - Urinalysis, Routine w reflex microscopic   No follow-ups on file.  Wilkie Aye, MD  [facility] [place]

## 2023-01-05 ENCOUNTER — Encounter: Payer: Self-pay | Admitting: Urology

## 2023-01-05 NOTE — Patient Instructions (Signed)

## 2023-01-11 DIAGNOSIS — D721 Eosinophilia, unspecified: Secondary | ICD-10-CM | POA: Diagnosis not present

## 2023-01-11 DIAGNOSIS — J4540 Moderate persistent asthma, uncomplicated: Secondary | ICD-10-CM | POA: Diagnosis not present

## 2023-01-14 DIAGNOSIS — M25512 Pain in left shoulder: Secondary | ICD-10-CM | POA: Insufficient documentation

## 2023-01-14 DIAGNOSIS — M25511 Pain in right shoulder: Secondary | ICD-10-CM | POA: Diagnosis not present

## 2023-01-14 DIAGNOSIS — M13811 Other specified arthritis, right shoulder: Secondary | ICD-10-CM | POA: Diagnosis not present

## 2023-01-25 ENCOUNTER — Other Ambulatory Visit: Payer: Medicare (Managed Care) | Admitting: Urology

## 2023-01-31 ENCOUNTER — Ambulatory Visit: Payer: Medicare (Managed Care)

## 2023-02-07 ENCOUNTER — Ambulatory Visit (INDEPENDENT_AMBULATORY_CARE_PROVIDER_SITE_OTHER): Payer: Medicare (Managed Care)

## 2023-02-07 DIAGNOSIS — C679 Malignant neoplasm of bladder, unspecified: Secondary | ICD-10-CM

## 2023-02-07 LAB — URINALYSIS, ROUTINE W REFLEX MICROSCOPIC
Bilirubin, UA: NEGATIVE
Glucose, UA: NEGATIVE
Ketones, UA: NEGATIVE
Nitrite, UA: NEGATIVE
Protein,UA: NEGATIVE
Specific Gravity, UA: 1.02 (ref 1.005–1.030)
Urobilinogen, Ur: 0.2 mg/dL (ref 0.2–1.0)
pH, UA: 5.5 (ref 5.0–7.5)

## 2023-02-07 MED ORDER — BCG LIVE 50 MG IS SUSR
3.2400 mL | Freq: Once | INTRAVESICAL | Status: DC
Start: 2023-02-07 — End: 2023-02-07

## 2023-02-07 NOTE — Patient Instructions (Signed)

## 2023-02-07 NOTE — Progress Notes (Signed)
BCG Bladder Instillation  BCG # 1 of 6  Due to Bladder Cancer patient is present today for a BCG treatment. Patient was cleaned and prepped in a sterile fashion with betadine. A 14FR catheter was inserted, urine return was noted 40ml, urine was yellow in color.  50ml of reconstituted BCG was instilled into the bladder. The catheter was then removed. Patient tolerated well, no complications were noted  Performed by: Guss Bunde, CMA  Follow up/ Additional notes: Follow up as scheduled.

## 2023-02-11 DIAGNOSIS — D721 Eosinophilia, unspecified: Secondary | ICD-10-CM | POA: Diagnosis not present

## 2023-02-11 DIAGNOSIS — J4540 Moderate persistent asthma, uncomplicated: Secondary | ICD-10-CM | POA: Diagnosis not present

## 2023-02-12 ENCOUNTER — Other Ambulatory Visit: Payer: Self-pay | Admitting: Family Medicine

## 2023-02-12 DIAGNOSIS — E785 Hyperlipidemia, unspecified: Secondary | ICD-10-CM

## 2023-02-14 ENCOUNTER — Ambulatory Visit (INDEPENDENT_AMBULATORY_CARE_PROVIDER_SITE_OTHER): Payer: Medicare (Managed Care)

## 2023-02-14 DIAGNOSIS — C679 Malignant neoplasm of bladder, unspecified: Secondary | ICD-10-CM

## 2023-02-14 LAB — URINALYSIS, ROUTINE W REFLEX MICROSCOPIC
Bilirubin, UA: NEGATIVE
Glucose, UA: NEGATIVE
Ketones, UA: NEGATIVE
Leukocytes,UA: NEGATIVE
Nitrite, UA: NEGATIVE
Protein,UA: NEGATIVE
RBC, UA: NEGATIVE
Specific Gravity, UA: 1.015 (ref 1.005–1.030)
Urobilinogen, Ur: 0.2 mg/dL (ref 0.2–1.0)
pH, UA: 6 (ref 5.0–7.5)

## 2023-02-14 MED ORDER — BCG LIVE 50 MG IS SUSR
3.2400 mL | Freq: Once | INTRAVESICAL | Status: DC
Start: 2023-02-14 — End: 2023-02-14

## 2023-02-14 NOTE — Progress Notes (Signed)
BCG Bladder Instillation  BCG # 2 of 6  Due to Bladder Cancer patient is present today for a BCG treatment. Patient was cleaned and prepped in a sterile fashion with betadine. A 14FR catheter was inserted, urine return was noted 30ml, urine was yellow in color.  50ml of reconstituted BCG was instilled into the bladder. The catheter was then removed. Patient tolerated well, no complications were noted  Performed by: Amarius Toto LPN  Follow up/ Additional notes: keep scheduled NV

## 2023-02-21 ENCOUNTER — Ambulatory Visit (INDEPENDENT_AMBULATORY_CARE_PROVIDER_SITE_OTHER): Payer: Medicare (Managed Care)

## 2023-02-21 DIAGNOSIS — C679 Malignant neoplasm of bladder, unspecified: Secondary | ICD-10-CM | POA: Diagnosis not present

## 2023-02-21 LAB — URINALYSIS, ROUTINE W REFLEX MICROSCOPIC
Bilirubin, UA: NEGATIVE
Glucose, UA: NEGATIVE
Ketones, UA: NEGATIVE
Nitrite, UA: NEGATIVE
Specific Gravity, UA: 1.015 (ref 1.005–1.030)
Urobilinogen, Ur: 0.2 mg/dL (ref 0.2–1.0)
pH, UA: 7 (ref 5.0–7.5)

## 2023-02-21 NOTE — Addendum Note (Signed)
Addended by: Alfredo Martinez K on: 02/21/2023 10:01 AM   Modules accepted: Level of Service

## 2023-02-21 NOTE — Progress Notes (Addendum)
BCG Bladder Instillation  BCG # 3 of 6  Due to Bladder Cancer patient is present today for a BCG treatment. Patient was cleaned and prepped in a sterile fashion with betadine. A 14FR catheter was inserted, urine return was noted 20ml, urine was yellow in color.  50ml of reconstituted BCG was instilled into the bladder. The catheter was then removed. Patient tolerated well, no complications were noted  Performed by: Jill Side RN  Follow up/ Additional notes: weekly BCG

## 2023-02-21 NOTE — Patient Instructions (Signed)

## 2023-02-28 ENCOUNTER — Telehealth: Payer: Self-pay

## 2023-02-28 ENCOUNTER — Ambulatory Visit (INDEPENDENT_AMBULATORY_CARE_PROVIDER_SITE_OTHER): Payer: Medicare (Managed Care)

## 2023-02-28 DIAGNOSIS — C679 Malignant neoplasm of bladder, unspecified: Secondary | ICD-10-CM

## 2023-02-28 LAB — URINALYSIS, ROUTINE W REFLEX MICROSCOPIC
Bilirubin, UA: NEGATIVE
Glucose, UA: NEGATIVE
Ketones, UA: NEGATIVE
Nitrite, UA: NEGATIVE
Specific Gravity, UA: 1.02 (ref 1.005–1.030)
Urobilinogen, Ur: 0.2 mg/dL (ref 0.2–1.0)
pH, UA: 6 (ref 5.0–7.5)

## 2023-02-28 NOTE — Telephone Encounter (Signed)
Patient had BCG today # 4. Wife reports patient has temperature after BCG today of 100.6  Wife reports first issues after having BCG.  Message sent to MD for advice and dose adjustment.

## 2023-02-28 NOTE — Progress Notes (Signed)
BCG Bladder Instillation  BCG # 4 of 6  Due to Bladder Cancer patient is present today for a BCG treatment. Patient was cleaned and prepped in a sterile fashion with betadine. A 14FR catheter was inserted, urine return was noted 50ml, urine was yellow in color.  50ml of reconstituted BCG was instilled into the bladder. The catheter was then removed. Patient tolerated well, no complications were noted  Performed by: Guss Bunde, CMA  Follow up/ Additional notes: Follow up as scheduled   Patient states that he has had trouble with urgency an "dribbling" when voiding.  Patient asked if he could take a sea moss supplement, he has a friend that has had success with the supplement.  Verbal from Maralyn Sago that she doesn't think there would be any contradiction with his Tamsulosin so ok to try the [facility] as long as he continues taking his Tamsulosin.  Patient voiced understanding.  He is also aware that per Maralyn Sago the urgency symptoms could be from the BCG treatment irritating his bladder.

## 2023-03-01 NOTE — Telephone Encounter (Signed)
FYI see patient chart for urology note created when chart opens for BCG dose change.

## 2023-03-01 NOTE — Telephone Encounter (Signed)
Will place note in chart for nursing staff

## 2023-03-06 ENCOUNTER — Telehealth: Payer: Self-pay

## 2023-03-07 ENCOUNTER — Ambulatory Visit: Payer: Medicare (Managed Care)

## 2023-03-07 ENCOUNTER — Ambulatory Visit (INDEPENDENT_AMBULATORY_CARE_PROVIDER_SITE_OTHER): Payer: Medicare (Managed Care)

## 2023-03-07 DIAGNOSIS — R32 Unspecified urinary incontinence: Secondary | ICD-10-CM

## 2023-03-07 DIAGNOSIS — R300 Dysuria: Secondary | ICD-10-CM | POA: Diagnosis not present

## 2023-03-07 DIAGNOSIS — C679 Malignant neoplasm of bladder, unspecified: Secondary | ICD-10-CM

## 2023-03-07 DIAGNOSIS — R3914 Feeling of incomplete bladder emptying: Secondary | ICD-10-CM

## 2023-03-07 DIAGNOSIS — K5900 Constipation, unspecified: Secondary | ICD-10-CM

## 2023-03-07 LAB — URINALYSIS, ROUTINE W REFLEX MICROSCOPIC
Bilirubin, UA: NEGATIVE
Glucose, UA: NEGATIVE
Ketones, UA: NEGATIVE
Nitrite, UA: NEGATIVE
Specific Gravity, UA: 1.025 (ref 1.005–1.030)
Urobilinogen, Ur: 0.2 mg/dL (ref 0.2–1.0)
pH, UA: 6 (ref 5.0–7.5)

## 2023-03-07 LAB — MICROSCOPIC EXAMINATION: WBC, UA: >30 /hpf — AB (ref 0–5)

## 2023-03-07 NOTE — Progress Notes (Unsigned)
Patient states he does not want to have BCG #5 of 6 treatment today due to dysuria, constipation and urinary incontinence and the feeling of not emptying bladder completely. Patient will leave urine specimen to check for infection and will have BCG txt rescheduled.   post void residual=49 Urine sent for culture.   Ua results reviewed by Dr. Ronne Binning  Doxycycline 100 mg po bid x 7 days sent to pharmacy per Dr. Ronne Binning.  Patient will resume BCG # 5  of 6 next week.

## 2023-03-08 MED ORDER — DOXYCYCLINE HYCLATE 100 MG PO CAPS
100.0000 mg | ORAL_CAPSULE | Freq: Two times a day (BID) | ORAL | 0 refills | Status: DC
Start: 2023-03-08 — End: 2023-05-03

## 2023-03-09 LAB — URINE CULTURE

## 2023-03-13 DIAGNOSIS — J4540 Moderate persistent asthma, uncomplicated: Secondary | ICD-10-CM | POA: Diagnosis not present

## 2023-03-13 DIAGNOSIS — D721 Eosinophilia, unspecified: Secondary | ICD-10-CM | POA: Diagnosis not present

## 2023-03-14 ENCOUNTER — Telehealth: Payer: Self-pay

## 2023-03-14 ENCOUNTER — Ambulatory Visit: Payer: Medicare (Managed Care)

## 2023-03-14 DIAGNOSIS — C679 Malignant neoplasm of bladder, unspecified: Secondary | ICD-10-CM

## 2023-03-14 LAB — URINALYSIS, ROUTINE W REFLEX MICROSCOPIC
Bilirubin, UA: NEGATIVE
Glucose, UA: NEGATIVE
Ketones, UA: NEGATIVE
Nitrite, UA: NEGATIVE
Specific Gravity, UA: 1.01 (ref 1.005–1.030)
Urobilinogen, Ur: 0.2 mg/dL (ref 0.2–1.0)
pH, UA: 6 (ref 5.0–7.5)

## 2023-03-14 NOTE — Telephone Encounter (Signed)
Patient states he is having trouble emptying his bladder, he is having incontinence and painful urination. 1/2 BCG dose given at this visit.  Patient states he is still taking 2 tabs of tamsulosin daily.  He is asking if you think this could be related to the BCG treatment or if his prostate could be causing him more issues.

## 2023-03-14 NOTE — Telephone Encounter (Signed)
Pt added to NV schedule for ua/uc per MD

## 2023-03-14 NOTE — Progress Notes (Signed)
BCG Bladder Instillation  BCG # 5 of 6  Due to Bladder Cancer patient is present today for a BCG treatment. Patient was cleaned and prepped in a sterile fashion with betadine. A 14FR catheter was inserted, urine return was noted 50ml, urine was yellow in color.  50ml of reconstituted BCG was instilled into the bladder. The catheter was then removed. Patient tolerated well, no complications were noted  Performed by: Guss Bunde, CMA  Follow up/ Additional notes: Follow up as scheduled. See sticky note in pt chart for dosing changes

## 2023-03-14 NOTE — Addendum Note (Signed)
Addended by: Grier Rocher on: 03/14/2023 04:43 PM   Modules accepted: Orders

## 2023-03-15 ENCOUNTER — Ambulatory Visit: Payer: Medicare (Managed Care)

## 2023-03-15 DIAGNOSIS — C679 Malignant neoplasm of bladder, unspecified: Secondary | ICD-10-CM | POA: Diagnosis not present

## 2023-03-15 LAB — URINALYSIS, ROUTINE W REFLEX MICROSCOPIC
Bilirubin, UA: NEGATIVE
Glucose, UA: NEGATIVE
Ketones, UA: NEGATIVE
Nitrite, UA: NEGATIVE
Specific Gravity, UA: 1.01 (ref 1.005–1.030)
Urobilinogen, Ur: 0.2 mg/dL (ref 0.2–1.0)
pH, UA: 6 (ref 5.0–7.5)

## 2023-03-15 LAB — MICROSCOPIC EXAMINATION
Bacteria, UA: NONE SEEN
WBC, UA: >30 /hpf — AB (ref 0–5)

## 2023-03-15 NOTE — Progress Notes (Addendum)
Patient presents today with complaints of  burning, frequent urination.  UA and Culture done today.  Dr. Ronne Binning reviewed results and no treatment . Patient aware of MD recommendations and that we will reach out with culture results.      MVHQIONG, CMA

## 2023-03-21 ENCOUNTER — Ambulatory Visit: Payer: Medicare (Managed Care)

## 2023-03-21 DIAGNOSIS — C679 Malignant neoplasm of bladder, unspecified: Secondary | ICD-10-CM | POA: Diagnosis not present

## 2023-03-21 LAB — URINALYSIS, ROUTINE W REFLEX MICROSCOPIC
Bilirubin, UA: NEGATIVE
Glucose, UA: NEGATIVE
Ketones, UA: NEGATIVE
Nitrite, UA: NEGATIVE
Specific Gravity, UA: 1.02 (ref 1.005–1.030)
Urobilinogen, Ur: 0.2 mg/dL (ref 0.2–1.0)
pH, UA: 6 (ref 5.0–7.5)

## 2023-03-21 MED ORDER — BCG LIVE 50 MG IS SUSR
3.2400 mL | Freq: Once | INTRAVESICAL | Status: AC
Start: 2023-03-21 — End: 2023-03-21
  Administered 2023-03-21: 40.5 mg via INTRAVESICAL

## 2023-03-21 NOTE — Progress Notes (Signed)
BCG Bladder Instillation  BCG # 6 of 6  Due to Bladder Cancer patient is present today for a BCG treatment. Patient was cleaned and prepped in a sterile fashion with betadine. A 14FR catheter was inserted, urine return was noted 50ml, urine was yellow in color.  50ml of reconstituted BCG was instilled into the bladder. The catheter was then removed. Patient tolerated well, no complications were noted  Performed by: Guss Bunde, CMA  Follow up/ Additional notes: Patient given 1/2 dose

## 2023-04-06 ENCOUNTER — Telehealth: Payer: Self-pay | Admitting: Family Medicine

## 2023-04-06 NOTE — Telephone Encounter (Signed)
Encourage patient to contact the pharmacy for refills or they can request refills through [facility]   WHAT PHARMACY WOULD THEY LIKE THIS SENT TO:  [id number] - [place], [place] - [address], [place] [address]   MEDICATION NAME & DOSE: albuterol (VENTOLIN HFA) 108 (90 Base) MCG/ACT inhaler  NOTES/COMMENTS FROM PATIENT:      Front office please notify patient: It takes 48-72 hours to process rx refill requests Ask patient to call pharmacy to ensure rx is ready before heading there.

## 2023-04-06 NOTE — Telephone Encounter (Signed)
Called spoke with pt and advised him to contact his pharmacy for refill. Per last refill 11/2022

## 2023-04-07 ENCOUNTER — Telehealth: Payer: Self-pay

## 2023-04-07 DIAGNOSIS — N401 Enlarged prostate with lower urinary tract symptoms: Secondary | ICD-10-CM

## 2023-04-07 MED ORDER — FINASTERIDE 5 MG PO TABS
5.0000 mg | ORAL_TABLET | Freq: Every day | ORAL | 0 refills | Status: DC
Start: 2023-04-07 — End: 2023-07-18

## 2023-04-07 NOTE — Telephone Encounter (Signed)
Patient called and notified refill submitted to pharmacy.

## 2023-04-07 NOTE — Telephone Encounter (Signed)
Patient left a voice message 04-06-2023.  Requesting refills on  finasteride (PROSCAR) 5 MG tablet   Please advise.

## 2023-04-13 DIAGNOSIS — J4540 Moderate persistent asthma, uncomplicated: Secondary | ICD-10-CM | POA: Diagnosis not present

## 2023-04-13 DIAGNOSIS — D721 Eosinophilia, unspecified: Secondary | ICD-10-CM | POA: Diagnosis not present

## 2023-05-03 ENCOUNTER — Encounter: Payer: Self-pay | Admitting: Urology

## 2023-05-03 ENCOUNTER — Ambulatory Visit (INDEPENDENT_AMBULATORY_CARE_PROVIDER_SITE_OTHER): Payer: Medicare (Managed Care) | Admitting: Urology

## 2023-05-03 VITALS — BP 148/74 | HR 77

## 2023-05-03 DIAGNOSIS — C679 Malignant neoplasm of bladder, unspecified: Secondary | ICD-10-CM | POA: Diagnosis not present

## 2023-05-03 DIAGNOSIS — Z8551 Personal history of malignant neoplasm of bladder: Secondary | ICD-10-CM | POA: Diagnosis not present

## 2023-05-03 DIAGNOSIS — R8289 Other abnormal findings on cytological and histological examination of urine: Secondary | ICD-10-CM | POA: Diagnosis not present

## 2023-05-03 LAB — URINALYSIS, ROUTINE W REFLEX MICROSCOPIC
Bilirubin, UA: NEGATIVE
Ketones, UA: NEGATIVE
Leukocytes,UA: NEGATIVE
Nitrite, UA: NEGATIVE
Specific Gravity, UA: 1.025 (ref 1.005–1.030)
Urobilinogen, Ur: 1 mg/dL (ref 0.2–1.0)
pH, UA: 6 (ref 5.0–7.5)

## 2023-05-03 LAB — MICROSCOPIC EXAMINATION: Bacteria, UA: NONE SEEN

## 2023-05-03 MED ORDER — DOXYCYCLINE HYCLATE 100 MG PO CAPS: 100.0000 mg | ORAL_CAPSULE | Freq: Two times a day (BID) | ORAL | 0 refills | Status: DC

## 2023-05-03 MED ORDER — CIPROFLOXACIN HCL 500 MG PO TABS
500.0000 mg | ORAL_TABLET | Freq: Once | ORAL | Status: AC
Start: 2023-05-03 — End: 2023-05-03
  Administered 2023-05-03: 500 mg via ORAL

## 2023-05-03 NOTE — Patient Instructions (Signed)

## 2023-05-03 NOTE — Progress Notes (Signed)
   05/03/23  CC: followup bladder cancer   HPI: [name] is a 82yo here for followup for bladder cancer Blood pressure (!) 148/74, pulse 77. NED. A&Ox3.   No respiratory distress   Abd soft, NT, ND Normal phallus with bilateral descended testicles  Cystoscopy Procedure Note  Patient identification was confirmed, informed consent was obtained, and patient was prepped using Betadine solution.  Lidocaine jelly was administered per urethral meatus.     Pre-Procedure: - Inspection reveals a normal caliber ureteral meatus.  Procedure: The flexible cystoscope was introduced without difficulty - No urethral strictures/lesions are present. - Enlarged prostate  - Normal bladder neck - Bilateral ureteral orifices identified - Bladder mucosa  reveals diffuse erythema 80% of bladder mucosa - No bladder stones - No trabeculation    Post-Procedure: - Patient tolerated the procedure well  Assessment/ Plan: Urine for cytology. Doxycycline 100mg  BID for 28 days  No follow-ups on file.  Wilkie Aye, MD

## 2023-05-05 LAB — CYTOLOGY, URINE

## 2023-05-05 LAB — SPECIMEN STATUS REPORT

## 2023-05-09 ENCOUNTER — Telehealth: Payer: Self-pay

## 2023-05-09 NOTE — Telephone Encounter (Signed)
-----   Message from Wilkie Aye sent at 05/09/2023 10:02 AM EDT ----- Please send for [facility] ----- Message ----- From: Nell Range Lab Results In Sent: 05/03/2023  10:36 AM EDT To: Malen Gauze, MD

## 2023-05-09 NOTE — Telephone Encounter (Signed)
Patient called with no answer. Detailed message left.

## 2023-05-10 ENCOUNTER — Ambulatory Visit (INDEPENDENT_AMBULATORY_CARE_PROVIDER_SITE_OTHER): Payer: Medicare (Managed Care)

## 2023-05-10 DIAGNOSIS — C679 Malignant neoplasm of bladder, unspecified: Secondary | ICD-10-CM | POA: Diagnosis not present

## 2023-05-10 NOTE — Telephone Encounter (Signed)
Patient came by office today to drop off urine specimen.

## 2023-05-10 NOTE — Progress Notes (Signed)
Urine FISH done 05/10/23 [id number]  Tracking [id number]

## 2023-05-11 LAB — URINALYSIS, ROUTINE W REFLEX MICROSCOPIC
Bilirubin, UA: NEGATIVE
Glucose, UA: NEGATIVE
Ketones, UA: NEGATIVE
Nitrite, UA: NEGATIVE
Protein,UA: NEGATIVE
RBC, UA: NEGATIVE
Specific Gravity, UA: 1.01 (ref 1.005–1.030)
Urobilinogen, Ur: 0.2 mg/dL (ref 0.2–1.0)
pH, UA: 6 (ref 5.0–7.5)

## 2023-05-11 LAB — MICROSCOPIC EXAMINATION
Bacteria, UA: NONE SEEN
RBC, Urine: NONE SEEN /hpf (ref 0–2)

## 2023-05-14 DIAGNOSIS — J4540 Moderate persistent asthma, uncomplicated: Secondary | ICD-10-CM | POA: Diagnosis not present

## 2023-05-14 DIAGNOSIS — D721 Eosinophilia, unspecified: Secondary | ICD-10-CM | POA: Diagnosis not present

## 2023-05-25 ENCOUNTER — Other Ambulatory Visit: Payer: Self-pay | Admitting: Family Medicine

## 2023-05-25 DIAGNOSIS — R0609 Other forms of dyspnea: Secondary | ICD-10-CM

## 2023-05-25 DIAGNOSIS — R062 Wheezing: Secondary | ICD-10-CM

## 2023-06-09 ENCOUNTER — Other Ambulatory Visit: Payer: Self-pay

## 2023-06-09 ENCOUNTER — Telehealth: Payer: Self-pay | Admitting: Family Medicine

## 2023-06-09 DIAGNOSIS — I10 Essential (primary) hypertension: Secondary | ICD-10-CM

## 2023-06-09 MED ORDER — LOSARTAN POTASSIUM-HCTZ 50-12.5 MG PO TABS: 1.0000 | ORAL_TABLET | Freq: Every day | ORAL | 1 refills | Status: DC

## 2023-06-09 NOTE — Telephone Encounter (Signed)
Encourage patient to contact the pharmacy for refills or they can request refills through [facility]   WHAT PHARMACY WOULD THEY LIKE THIS SENT TO:  [id number] - [place], [place] - [address]    MEDICATION NAME & DOSE: losartan-hydrochlorothiazide (HYZAAR) 50-12.5 MG tablet    NOTES/COMMENTS FROM PATIENT: Pt has appt 06/28/2023     Front office please notify patient: It takes 48-72 hours to process rx refill requests Ask patient to call pharmacy to ensure rx is ready before heading there.

## 2023-06-09 NOTE — Telephone Encounter (Signed)
Pt made aware this was sent

## 2023-06-13 DIAGNOSIS — J4540 Moderate persistent asthma, uncomplicated: Secondary | ICD-10-CM | POA: Diagnosis not present

## 2023-06-13 DIAGNOSIS — D721 Eosinophilia, unspecified: Secondary | ICD-10-CM | POA: Diagnosis not present

## 2023-06-28 ENCOUNTER — Ambulatory Visit (INDEPENDENT_AMBULATORY_CARE_PROVIDER_SITE_OTHER): Payer: 59 | Admitting: Family Medicine

## 2023-06-28 ENCOUNTER — Encounter: Payer: Self-pay | Admitting: Family Medicine

## 2023-06-28 ENCOUNTER — Ambulatory Visit: Payer: Medicare (Managed Care) | Admitting: Family Medicine

## 2023-06-28 VITALS — BP 122/66 | HR 62 | Temp 98.0°F | Ht 69.0 in | Wt 162.6 lb

## 2023-06-28 DIAGNOSIS — Z789 Other specified health status: Secondary | ICD-10-CM | POA: Diagnosis not present

## 2023-06-28 DIAGNOSIS — M79604 Pain in right leg: Secondary | ICD-10-CM

## 2023-06-28 DIAGNOSIS — I10 Essential (primary) hypertension: Secondary | ICD-10-CM | POA: Diagnosis not present

## 2023-06-28 DIAGNOSIS — D494 Neoplasm of unspecified behavior of bladder: Secondary | ICD-10-CM | POA: Diagnosis not present

## 2023-06-28 DIAGNOSIS — E785 Hyperlipidemia, unspecified: Secondary | ICD-10-CM | POA: Diagnosis not present

## 2023-06-28 DIAGNOSIS — R454 Irritability and anger: Secondary | ICD-10-CM | POA: Diagnosis not present

## 2023-06-28 DIAGNOSIS — E1165 Type 2 diabetes mellitus with hyperglycemia: Secondary | ICD-10-CM | POA: Diagnosis not present

## 2023-06-28 DIAGNOSIS — Z7984 Long term (current) use of oral hypoglycemic drugs: Secondary | ICD-10-CM | POA: Diagnosis not present

## 2023-06-28 DIAGNOSIS — J449 Chronic obstructive pulmonary disease, unspecified: Secondary | ICD-10-CM

## 2023-06-28 DIAGNOSIS — R413 Other amnesia: Secondary | ICD-10-CM

## 2023-06-28 LAB — GLUCOSE, POCT (MANUAL RESULT ENTRY): POC Glucose: 118 mg/dL — AB (ref 70–99)

## 2023-06-28 MED ORDER — TRELEGY ELLIPTA 100-62.5-25 MCG/ACT IN AEPB: 1.0000 | INHALATION_SPRAY | Freq: Every day | RESPIRATORY_TRACT | 1 refills | Status: DC

## 2023-06-28 MED ORDER — ATORVASTATIN CALCIUM 10 MG PO TABS: 10.0000 mg | ORAL_TABLET | Freq: Every day | ORAL | 1 refills | Status: DC
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
## 2023-06-28 NOTE — Patient Instructions (Addendum)
I will check labs today, but we will follow up to discuss plan further in next few weeks.  Cut back on alcohol. Contact your urologist if you are having difficulty urinating.  I do recommend meeting with therapist - see numbers below to schedule. Marital counseling and to discuss stressors that can occur with treatment of cancer. We will discuss plan for diabetes next visit.  Leg pain could be a mild form of sciatica.  See information below.  Topical muscle rubs are fine for now if those help.  If any calf swelling or new symptoms, you need to be seen but I do not expect that to occur.   Here are a few options for counseling:  [facility]:  [phone]  [facility]:  [phone]  [facility] [phone]   Sciatica  Sciatica is pain, numbness, weakness, or tingling along the path of the sciatic nerve. The sciatic nerve starts in the lower back and runs down the back of each leg. The nerve controls the muscles in the lower leg and in the back of the knee. It also provides feeling (sensation) to the back of the thigh, the lower leg, and the sole of the foot. Sciatica is a symptom of another medical condition that pinches or puts pressure on the sciatic nerve. Sciatica most often only affects one side of the body. Sciatica usually goes away on its own or with treatment. In some cases, sciatica may come back (recur). What are the causes? This condition is caused by pressure on the sciatic nerve or pinching of the nerve. This may be the result of: A disk in between the bones of the spine bulging out too far (herniated disk). Age-related changes in the spinal disks. A pain disorder that affects a muscle in the buttock. Extra bone growth near the sciatic nerve. A break (fracture) of the pelvis. Pregnancy. Tumor. This is rare. What increases the risk? The following factors may make you more likely to develop this  condition: Playing sports that place pressure or stress on the spine. Having poor strength and flexibility. A history of back injury or surgery. Sitting for long periods of time. Doing activities that involve repetitive bending or lifting. Obesity. What are the signs or symptoms? Symptoms can vary from mild to very severe. They may include: Any of the following problems in the lower back, leg, hip, or buttock: Mild tingling, numbness, or dull aches. Burning sensations. Sharp pains. Numbness in the back of the calf or the sole of the foot. Leg weakness. Severe back pain that makes movement difficult. Symptoms may get worse when you cough, sneeze, or laugh, or when you sit or stand for long periods of time. How is this diagnosed? This condition may be diagnosed based on: Your symptoms and medical history. A physical exam. Blood tests. Imaging tests, such as: X-rays. An MRI. A CT scan. How is this treated? In many cases, this condition improves on its own without treatment. However, treatment may include: Reducing or modifying physical activity. Exercising, including strengthening and stretching. Icing and applying heat to the affected area. Medicines that help to: Relieve pain and swelling. Relax your muscles. Injections of medicines that help to relieve pain and inflammation (steroids) around the sciatic nerve. Surgery. Follow these instructions at home: Medicines Take over-the-counter and prescription medicines only as told by your health care provider. Ask your health care provider if the medicine prescribed to you requires you to avoid driving or using heavy machinery. Managing  pain     If directed, put ice on the affected area. To do this: Put ice in a plastic bag. Place a towel between your skin and the bag. Leave the ice on for 20 minutes, 2-3 times a day. If your skin turns bright red, remove the ice right away to prevent skin damage. The risk of skin damage is  higher if you cannot feel pain, heat, or cold. If directed, apply heat to the affected area as often as told by your health care provider. Use the heat source that your health care provider recommends, such as a moist heat pack or a heating pad. Place a towel between your skin and the heat source. Leave the heat on for 20-30 minutes. If your skin turns bright red, remove the heat right away to prevent burns. The risk of burns is higher if you cannot feel pain, heat, or cold. Activity  Return to your normal activities as told by your health care provider. Ask your health care provider what activities are safe for you. Avoid activities that make your symptoms worse. Take brief periods of rest throughout the day. When you rest for longer periods, mix in some mild activity or stretching between periods of rest. This will help to prevent stiffness and pain. Avoid sitting for long periods of time without moving. Get up and move around at least one time each hour. Exercise and stretch regularly as told by your health care provider. Do not lift anything that is heavier than 10 lb (4.5 kg) until your health care provider says that it is safe. When you do not have symptoms, you should still avoid heavy lifting, especially repetitive heavy lifting. When you lift objects, always use proper lifting technique, which includes: Bending your knees. Keeping the load close to your body. Avoiding twisting. General instructions Maintain a healthy weight. Excess weight puts extra stress on your back. Wear supportive, comfortable shoes. Avoid wearing high heels. Avoid sleeping on a mattress that is too soft or too hard. A mattress that is firm enough to support your back when you sleep may help to reduce your pain. Contact a health care provider if: Your pain is not controlled by medicine. Your pain does not improve or gets worse. Your pain lasts longer than 4 weeks. You have unexplained weight loss. Get help  right away if: You are not able to control when you urinate or have bowel movements (incontinence). You have: Weakness in your lower back, pelvis, buttocks, or legs that gets worse. Redness or swelling of your back. A burning sensation when you urinate. Summary Sciatica is pain, numbness, weakness, or tingling along the path of the sciatic nerve, which may include the lower back, legs, hips, and buttocks. This condition is caused by pressure on the sciatic nerve or pinching of the nerve. Treatment often includes rest, exercise, medicines, and applying ice or heat. This information is not intended to replace advice given to you by your health care provider. Make sure you discuss any questions you have with your health care provider. Document Revised: 11/08/2021 Document Reviewed: 11/08/2021 Elsevier Patient Education  2024 [employer].

## 2023-06-29 LAB — COMPREHENSIVE METABOLIC PANEL
ALT: 12 U/L (ref 0–53)
AST: 18 U/L (ref 0–37)
Albumin: 4.4 g/dL (ref 3.5–5.2)
Alkaline Phosphatase: 60 U/L (ref 39–117)
BUN: 11 mg/dL (ref 6–23)
CO2: 30 meq/L (ref 19–32)
Calcium: 9.7 mg/dL (ref 8.4–10.5)
Chloride: 97 meq/L (ref 96–112)
Creatinine, Ser: 1.08 mg/dL (ref 0.40–1.50)
GFR: 64.86 mL/min (ref >60.00)
Glucose, Bld: 122 mg/dL — ABNORMAL HIGH (ref 70–99)
Potassium: 4.4 meq/L (ref 3.5–5.1)
Sodium: 135 meq/L (ref 135–145)
Total Bilirubin: 1 mg/dL (ref 0.2–1.2)
Total Protein: 8.1 g/dL (ref 6.0–8.3)

## 2023-06-29 LAB — LIPID PANEL
Cholesterol: 155 mg/dL (ref 0–200)
HDL: 57.1 mg/dL (ref >39.00)
LDL Cholesterol: 63 mg/dL (ref 0–99)
NonHDL: 97.6
Total CHOL/HDL Ratio: 3
Triglycerides: 173 mg/dL — ABNORMAL HIGH (ref 0.0–149.0)
VLDL: 34.6 mg/dL (ref 0.0–40.0)

## 2023-06-29 LAB — VITAMIN B12: Vitamin B-12: 385 pg/mL (ref 211–911)

## 2023-06-29 LAB — MICROALBUMIN / CREATININE URINE RATIO
Creatinine,U: 125.1 mg/dL
Microalb Creat Ratio: 10.7 mg/g (ref 0.0–30.0)
Microalb, Ur: 13.4 mg/dL — ABNORMAL HIGH (ref 0.0–1.9)

## 2023-06-29 LAB — HEMOGLOBIN A1C: Hgb A1c MFr Bld: 6.5 % (ref 4.6–6.5)

## 2023-06-29 LAB — TSH: TSH: 1.78 u[IU]/mL (ref 0.35–5.50)

## 2023-07-14 DIAGNOSIS — J4540 Moderate persistent asthma, uncomplicated: Secondary | ICD-10-CM | POA: Diagnosis not present

## 2023-07-14 DIAGNOSIS — D721 Eosinophilia, unspecified: Secondary | ICD-10-CM | POA: Diagnosis not present

## 2023-07-16 ENCOUNTER — Other Ambulatory Visit: Payer: Self-pay | Admitting: Urology

## 2023-07-16 DIAGNOSIS — N401 Enlarged prostate with lower urinary tract symptoms: Secondary | ICD-10-CM

## 2023-07-19 ENCOUNTER — Encounter: Payer: Self-pay | Admitting: Family Medicine

## 2023-07-19 ENCOUNTER — Ambulatory Visit: Payer: 59 | Admitting: Family Medicine

## 2023-07-19 VITALS — BP 124/68 | HR 71 | Temp 98.0°F | Ht 69.0 in | Wt 168.0 lb

## 2023-07-19 DIAGNOSIS — Z23 Encounter for immunization: Secondary | ICD-10-CM

## 2023-07-19 DIAGNOSIS — E1165 Type 2 diabetes mellitus with hyperglycemia: Secondary | ICD-10-CM

## 2023-07-19 DIAGNOSIS — J449 Chronic obstructive pulmonary disease, unspecified: Secondary | ICD-10-CM

## 2023-07-19 DIAGNOSIS — M79604 Pain in right leg: Secondary | ICD-10-CM

## 2023-07-19 DIAGNOSIS — R413 Other amnesia: Secondary | ICD-10-CM

## 2023-07-19 DIAGNOSIS — R454 Irritability and anger: Secondary | ICD-10-CM

## 2023-07-19 DIAGNOSIS — Z789 Other specified health status: Secondary | ICD-10-CM

## 2023-07-19 MED ORDER — BLOOD GLUCOSE MONITORING SUPPL DEVI: 1.0000 | Freq: Three times a day (TID) | 0 refills | Status: AC

## 2023-07-19 MED ORDER — LANCET DEVICE MISC: 1.0000 | Freq: Three times a day (TID) | 0 refills | Status: AC

## 2023-07-19 MED ORDER — BLOOD GLUCOSE TEST VI STRP: 1.0000 | ORAL_STRIP | Freq: Three times a day (TID) | 0 refills | Status: AC

## 2023-07-19 MED ORDER — LANCETS MISC. MISC: 1.0000 | Freq: Three times a day (TID) | 0 refills | Status: AC

## 2023-07-19 NOTE — Progress Notes (Signed)
Subjective:  Patient ID: Phillip Oliphant., male    DOB: [birth year]  Age: 82 y.o. MRN: [id number]  CC:  Chief Complaint  Patient presents with   Leg Pain    Pt thinks this concern has resolved has been walking more recently    Memory Loss    Pt wife thinks drinking was playing a roll as this has improved not completely gone but has been significantly better     HPI Phillip Drews. presents for follow-up from November 13 visit   Diabetes: Off metformin at his last visit.  A1c was improved at 6.5.  Plan to continue to monitor.  No new meds.  Lab Results  Component Value Date   HGBA1C 6.5 06/28/2023   HGBA1C 7.5 (H) 12/16/2021   HGBA1C 7.8 (H) 09/29/2021   Lab Results  Component Value Date   MICROALBUR 13.4 (H) 06/28/2023   LDLCALC 63 06/28/2023   CREATININE 1.08 06/28/2023   COPD Off Trelegy last visit, recommended restart.  Albuterol as needed for breakthrough symptoms. Has been back on trelegy. Doing well - not needing albuterol recently.   Right leg pain Intermittent symptoms discussed last visit.  Possible sciatica.  Denied back pain.  Handout given on sciatica.  No new meds at that time.  Imaging initially deferred.  This has improved, has been walking more recently. Able to walk [place] without issue.  No need for oral pain meds. Has applied voltaren gel at areas of soreness and that has worked well.   Memory changes, mood disorder. Discussed last visit.  Some irritability, Lability, question component of adjustment disorder versus dementia versus alcohol related memory disorder.  We discussed cutting back on alcohol, TSH and B12 are ordered.  Option meet with neurology for further memory testing.  He did have a drop in his MoCA score since his previous testing in 2018 (29 to 23).  Discussed with patient and spouse last visit, plan for initial therapist evaluation, cutting back on alcohol to see how that changed his symptoms.  Phone numbers were  provided for therapy. B12 and TSH were both normal.  Has cut back on alcohol - no drinks since last visit. Memory has been better, not irritable. Able to talk better. No therapist at this point. Talking better. Here with spouse - she has noticed improvement with these areas. Would like to continue to monitor for now. Some repeating at times, but has been better off alcohol.      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/13/2021   Skin lesion 10/12/2018  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 No longer on medicine   Past Surgical History:  Procedure Laterality Date   COLONOSCOPY  last 09/06/2016   CYSTOSCOPY N/A 09/08/2016   Procedure: FIREFLY INJECTIONS;  Surgeon: Malen Gauze, MD;  Location: [facility];  Service: Urology;  Laterality: N/A;   CYSTOSCOPY W/ RETROGRADES Bilateral 11/18/2021   Procedure: CYSTOSCOPY WITH RETROGRADE PYELOGRAM;  Surgeon: Malen Gauze, MD;  Location: [facility];  Service: Urology;  Laterality: Bilateral;   CYSTOSCOPY WITH RETROGRADE PYELOGRAM, URETEROSCOPY AND STENT PLACEMENT Bilateral 12/22/2022   Procedure: CYSTOSCOPY WITH RETROGRADE PYELOGRAM;  Surgeon: Malen Gauze, MD;  Location: [facility];  Service: Urology;  Laterality: Bilateral;   INCISION AND DRAINAGE OF WOUND Right 1970's   "leg" (04/08/2013)   LIVER BIOPSY  ~ 2011   POLYPECTOMY     PROCTOSCOPY N/A 09/08/2016   Procedure: RIGID PROCTOSCOPY;  Surgeon: Karie Soda, MD;  Location: [facility];  Service: General;  Laterality: N/A;   TONSILLECTOMY     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   Procedure: TRANSURETHRAL RESECTION OF BLADDER TUMOR (TURBT);  Surgeon: Malen Gauze, MD;  Location: [facility];  Service: Urology;  Laterality: N/A;   TRANSURETHRAL RESECTION OF BLADDER TUMOR N/A 12/22/2022   Procedure: TRANSURETHRAL RESECTION OF BLADDER TUMOR (TURBT);  Surgeon: Malen Gauze, MD;  Location: [facility];  Service: Urology;  Laterality: N/A;   URETERAL BIOPSY Bilateral 12/22/2022   Procedure: URETERAL BIOPSY-selective cytologies;  Surgeon: Malen Gauze, MD;  Location: [facility];  Service: Urology;  Laterality: Bilateral;   Allergies  Allergen Reactions   Penicillins Anaphylaxis and Other (See Comments)    Has patient had a PCN reaction causing immediate rash, facial/tongue/throat swelling, SOB or lightheadedness with hypotension: Yes Has patient had a PCN reaction causing severe rash involving mucus membranes or skin necrosis: No Has patient had a PCN reaction  that required hospitalization No Has patient had a PCN reaction occurring within the last 10 years: No If all of the above answers are "NO", then may proceed with Cephalosporin use.   Shellfish Allergy Anaphylaxis    Shrimp  crabs   Prior to Admission medications   Medication Sig Start Date End Date Taking? Authorizing Provider  Accu-Chek Softclix Lancets lancets Use as instructed 03/21/22  Yes Shade Flood, MD  albuterol (VENTOLIN HFA) 108 (90 Base) MCG/ACT inhaler TAKE 2 PUFFS BY MOUTH EVERY 6 HOURS AS NEEDED FOR WHEEZE OR SHORTNESS OF BREATH 05/25/23  Yes Shade Flood, MD  atorvastatin (LIPITOR) 10 MG tablet Take 1 tablet (10 mg total) by mouth daily. 06/28/23  Yes Shade Flood, MD  blood glucose meter kit and supplies Dispense based on patient and insurance preference. Use up to four times daily as directed. (FOR ICD-10 E10.9, E11.9). 09/29/21  Yes Meredith Staggers  R, MD  doxycycline (VIBRAMYCIN) 100 MG capsule Take 1 capsule (100 mg total) by mouth every 12 (twelve) hours. 05/03/23  Yes McKenzie, Mardene Celeste, MD  finasteride (PROSCAR) 5 MG tablet TAKE 1 TABLET (5 MG TOTAL) BY MOUTH DAILY. 07/18/23  Yes McKenzie, Mardene Celeste, MD  Fluticasone-Umeclidin-Vilant (TRELEGY ELLIPTA) 100-62.5-25 MCG/ACT AEPB Inhale 1 puff into the lungs daily. 06/28/23  Yes Shade Flood, MD  glucose blood test strip Use as instructed up to once per day. Dx.E11.65 07/21/22  Yes Shade Flood, MD  losartan-hydrochlorothiazide (HYZAAR) 50-12.5 MG tablet Take 1 tablet by mouth daily. 06/09/23  Yes Shade Flood, MD  tamsulosin (FLOMAX) 0.4 MG CAPS capsule Take 2 capsules (0.8 mg total) by mouth at bedtime. Patient taking differently: Take 0.4 mg by mouth at bedtime. 03/14/22  Yes McKenzie, Mardene Celeste, MD   Social History   Socioeconomic History   Marital status: Married    Spouse name: Not on file   Number of children: 2   Years of education: Not on file   Highest education level: Not on file   Occupational History   Occupation: truck driver    Employer: [employer]    Comment: retired  Tobacco Use   Smoking status: Former    Current packs/day: 0.00    Average packs/day: 0.3 packs/day for 45.0 years (11.3 ttl pk-yrs)    Types: Cigarettes    Start date: 03/05/1976    Quit date: 03/05/2021    Years since quitting: 2.3   Smokeless tobacco: Never  Vaping Use   Vaping status: Never Used  Substance and Sexual Activity   Alcohol use: Yes    Alcohol/week: 12.0 standard drinks of alcohol    Types: 12 Cans of beer per week    Comment: 12 pack a week.   Drug use: No   Sexual activity: Yes  Other Topics Concern   Not on file  Social History Narrative   Pt lives in 1 story home with his wife   Has 2 adult children   Highest level of education: GED & Trade school   Retired Engineer, maintenance.    Social Determinants of Health   Financial Resource Strain: Low Risk  (07/21/2022)   Overall Financial Resource Strain (CARDIA)    Difficulty of Paying Living Expenses: Not hard at all  Food Insecurity: No Food Insecurity (07/21/2022)   Hunger Vital Sign    Worried About Running Out of Food in the Last Year: Never true    Ran Out of Food in the Last Year: Never true  Transportation Needs: No Transportation Needs (07/21/2022)   PRAPARE - Administrator, Civil Service (Medical): No    Lack of Transportation (Non-Medical): No  Physical Activity: Insufficiently Active (07/21/2022)   Exercise Vital Sign    Days of Exercise per Week: 3 days    Minutes of Exercise per Session: 30 min  Stress: No Stress Concern Present (07/21/2022)   [employer] of Occupational Health - Occupational Stress Questionnaire    Feeling of Stress : Not at all  Social Connections: Moderately Isolated (07/21/2022)   Social Connection and Isolation Panel [NHANES]    Frequency of Communication with Friends and Family: More than three times a week    Frequency of Social Gatherings with Friends  and Family: Once a week    Attends Religious Services: Never    Database administrator or Organizations: No    Attends Banker Meetings: Never  Marital Status: Married  Catering manager Violence: Not At Risk (07/21/2022)   Humiliation, Afraid, Rape, and Kick questionnaire    Fear of Current or Ex-Partner: No    Emotionally Abused: No    Physically Abused: No    Sexually Abused: No    Review of Systems   Objective:   Vitals:   07/19/23 1127  BP: 124/68  Pulse: 71  Temp: 98 F (36.7 C)  TempSrc: Temporal  SpO2: 96%  Weight: 168 lb (76.2 kg)  Height: 5\' 9"  (1.753 m)     Physical Exam Vitals reviewed.  Constitutional:      Appearance: He is well-developed.  HENT:     Head: Normocephalic and atraumatic.  Neck:     Vascular: No carotid bruit or JVD.  Cardiovascular:     Rate and Rhythm: Normal rate and regular rhythm.     Heart sounds: Normal heart sounds. No murmur heard. Pulmonary:     Effort: Pulmonary effort is normal.     Breath sounds: No rales.     Comments: Few scattered coarse breath sounds but normal effort, no respiratory distress, no wheeze. Musculoskeletal:     Right lower leg: No edema.     Left lower leg: No edema.     Comments: Pain-free seated straight leg raise, pain-free hip range of motion.  Ambulating without difficulty.  Skin:    General: Skin is warm and dry.  Neurological:     Mental Status: He is alert and oriented to person, place, and time.  Psychiatric:        Mood and Affect: Mood normal.        Behavior: Behavior normal.     Comments: Pleasant, appropriate responses.  No distress.      Assessment & Plan:  Phillip Achord. is a 82 y.o. male . Memory changes Alcohol use Irritability  -Irritability and memory symptoms have both improved off of alcohol.  Likely contributor to previous symptoms.  We discussed option to meet with neurology for possible underlying dementia but with improved symptoms plan to continue  monitoring for now, with recheck in 3 months.  Option to meet with neurology sooner if any persistent memory symptoms or forgetfulness.  Patient and spouse agree with plan.  Continue to avoid alcohol.  Chronic obstructive pulmonary disease, unspecified COPD type (HCC)  -Improved back on Trelegy, continue same with albuterol as needed and RTC precautions.  Right leg pain  -Possible previous sciatica, improved, RTC precautions.  No new meds for now  Type 2 diabetes mellitus with hyperglycemia, without long-term current use of insulin (HCC) - Plan: Blood Glucose Monitoring Suppl DEVI, Glucose Blood (BLOOD GLUCOSE TEST STRIPS) STRP, Lancet Device MISC, Lancets Misc. MISC  -Improved A1c last visit, hold on new meds at this time.  New meter provided.  Needs flu shot - Plan: Flu Vaccine Trivalent High Dose (Fluad)   Meds ordered this encounter  Medications   Blood Glucose Monitoring Suppl DEVI    Sig: 1 each by Does not apply route in the morning, at noon, and at bedtime. May substitute to any manufacturer covered by patient's insurance.    Dispense:  1 each    Refill:  0   Glucose Blood (BLOOD GLUCOSE TEST STRIPS) STRP    Sig: 1 each by In Vitro route in the morning, at noon, and at bedtime. May substitute to any manufacturer covered by patient's insurance.    Dispense:  100 strip    Refill:  0  Lancet Device MISC    Sig: 1 each by Does not apply route in the morning, at noon, and at bedtime. May substitute to any manufacturer covered by patient's insurance.    Dispense:  1 each    Refill:  0   Lancets Misc. MISC    Sig: 1 each by Does not apply route in the morning, at noon, and at bedtime. May substitute to any manufacturer covered by patient's insurance.    Dispense:  100 each    Refill:  0   Patient Instructions  Glad to hear things are better.  Great work on cutting back on alcohol.  That certainly could have been a factor in both the irritability and mood symptoms as well as some  memory symptoms.  Since those are improving I think it is reasonable to recheck in the next few months.  However, if any persistent forgetfulness, or memory issues, let me know and I will refer you to neurology sooner.  Take care!    Signed,   Meredith Staggers, MD [facility], [facility] 07/19/23 12:16 PM

## 2023-07-19 NOTE — Patient Instructions (Signed)
Glad to hear things are better.  Great work on cutting back on alcohol.  That certainly could have been a factor in both the irritability and mood symptoms as well as some memory symptoms.  Since those are improving I think it is reasonable to recheck in the next few months.  However, if any persistent forgetfulness, or memory issues, let me know and I will refer you to neurology sooner.  Take care!

## 2023-08-01 ENCOUNTER — Ambulatory Visit (INDEPENDENT_AMBULATORY_CARE_PROVIDER_SITE_OTHER): Payer: 59 | Admitting: *Deleted

## 2023-08-01 ENCOUNTER — Telehealth: Payer: Self-pay | Admitting: Family Medicine

## 2023-08-01 DIAGNOSIS — Z0000 Encounter for general adult medical examination without abnormal findings: Secondary | ICD-10-CM

## 2023-08-01 NOTE — Telephone Encounter (Signed)
Placed in your review folder at the nurse station

## 2023-08-01 NOTE — Telephone Encounter (Signed)
Type of form received:[employer] -House Calls Visit   Additional comments: FYI  Received by: [employer]   Form should be Faxed/mailed to:N/A  Is patient requesting call for pickup:N/A  Form placed: [employer] charge sheet.  Provider will determine charge. N/A  Individual made aware of 3-5 business day turn around No?

## 2023-08-01 NOTE — Patient Instructions (Signed)
[name] , Thank you for taking time to come for your Medicare Wellness Visit. I appreciate your ongoing commitment to your health goals. Please review the following plan we discussed and let me know if I can assist you in the future.   Screening recommendations/referrals: Colonoscopy: no longer required Recommended yearly ophthalmology/optometry visit for glaucoma screening and checkup Recommended yearly dental visit for hygiene and checkup  Vaccinations: Influenza vaccine: up to date Pneumococcal vaccine: up to date Tdap vaccine: up to date Shingles vaccine: Education provided    Advanced directives: Education provided   Preventive Care 65 Years and Older, Male Preventive care refers to lifestyle choices and visits with your health care provider that can promote health and wellness. What does preventive care include? A yearly physical exam. This is also called an annual well check. Dental exams once or twice a year. Routine eye exams. Ask your health care provider how often you should have your eyes checked. Personal lifestyle choices, including: Daily care of your teeth and gums. Regular physical activity. Eating a healthy diet. Avoiding tobacco and drug use. Limiting alcohol use. Practicing safe sex. Taking low doses of aspirin every day. Taking vitamin and mineral supplements as recommended by your health care provider. What happens during an annual well check? The services and screenings done by your health care provider during your annual well check will depend on your age, overall health, lifestyle risk factors, and family history of disease. Counseling  Your health care provider may ask you questions about your: Alcohol use. Tobacco use. Drug use. Emotional well-being. Home and relationship well-being. Sexual activity. Eating habits. History of falls. Memory and ability to understand (cognition). Work and work Astronomer. Screening  You may have the  following tests or measurements: Height, weight, and BMI. Blood pressure. Lipid and cholesterol levels. These may be checked every 5 years, or more frequently if you are over 51 years old. Skin check. Lung cancer screening. You may have this screening every year starting at age 46 if you have a 30-pack-year history of smoking and currently smoke or have quit within the past 15 years. Fecal occult blood test (FOBT) of the stool. You may have this test every year starting at age 59. Flexible sigmoidoscopy or colonoscopy. You may have a sigmoidoscopy every 5 years or a colonoscopy every 10 years starting at age 94. Prostate cancer screening. Recommendations will vary depending on your family history and other risks. Hepatitis C blood test. Hepatitis B blood test. Sexually transmitted disease (STD) testing. Diabetes screening. This is done by checking your blood sugar (glucose) after you have not eaten for a while (fasting). You may have this done every 1-3 years. Abdominal aortic aneurysm (AAA) screening. You may need this if you are a current or former smoker. Osteoporosis. You may be screened starting at age 41 if you are at high risk. Talk with your health care provider about your test results, treatment options, and if necessary, the need for more tests. Vaccines  Your health care provider may recommend certain vaccines, such as: Influenza vaccine. This is recommended every year. Tetanus, diphtheria, and acellular pertussis (Tdap, Td) vaccine. You may need a Td booster every 10 years. Zoster vaccine. You may need this after age 42. Pneumococcal 13-valent conjugate (PCV13) vaccine. One dose is recommended after age 96. Pneumococcal polysaccharide (PPSV23) vaccine. One dose is recommended after age 32. Talk to your health care provider about which screenings and vaccines you need and how often you need them. This information  is not intended to replace advice given to you by your health care  provider. Make sure you discuss any questions you have with your health care provider. Document Released: 08/28/2015 Document Revised: 04/20/2016 Document Reviewed: 06/02/2015 Elsevier Interactive Patient Education  2017 [employer].  Fall Prevention in the Home Falls can cause injuries. They can happen to people of all ages. There are many things you can do to make your home safe and to help prevent falls. What can I do on the outside of my home? Regularly fix the edges of walkways and driveways and fix any cracks. Remove anything that might make you trip as you walk through a door, such as a raised step or threshold. Trim any bushes or trees on the path to your home. Use bright outdoor lighting. Clear any walking paths of anything that might make someone trip, such as rocks or tools. Regularly check to see if handrails are loose or broken. Make sure that both sides of any steps have handrails. Any raised decks and porches should have guardrails on the edges. Have any leaves, snow, or ice cleared regularly. Use sand or salt on walking paths during winter. Clean up any spills in your garage right away. This includes oil or grease spills. What can I do in the bathroom? Use night lights. Install grab bars by the toilet and in the tub and shower. Do not use towel bars as grab bars. Use non-skid mats or decals in the tub or shower. If you need to sit down in the shower, use a plastic, non-slip stool. Keep the floor dry. Clean up any water that spills on the floor as soon as it happens. Remove soap buildup in the tub or shower regularly. Attach bath mats securely with double-sided non-slip rug tape. Do not have throw rugs and other things on the floor that can make you trip. What can I do in the bedroom? Use night lights. Make sure that you have a light by your bed that is easy to reach. Do not use any sheets or blankets that are too big for your bed. They should not hang down onto the  floor. Have a firm chair that has side arms. You can use this for support while you get dressed. Do not have throw rugs and other things on the floor that can make you trip. What can I do in the kitchen? Clean up any spills right away. Avoid walking on wet floors. Keep items that you use a lot in easy-to-reach places. If you need to reach something above you, use a strong step stool that has a grab bar. Keep electrical cords out of the way. Do not use floor polish or wax that makes floors slippery. If you must use wax, use non-skid floor wax. Do not have throw rugs and other things on the floor that can make you trip. What can I do with my stairs? Do not leave any items on the stairs. Make sure that there are handrails on both sides of the stairs and use them. Fix handrails that are broken or loose. Make sure that handrails are as long as the stairways. Check any carpeting to make sure that it is firmly attached to the stairs. Fix any carpet that is loose or worn. Avoid having throw rugs at the top or bottom of the stairs. If you do have throw rugs, attach them to the floor with carpet tape. Make sure that you have a light switch at the top of  the stairs and the bottom of the stairs. If you do not have them, ask someone to add them for you. What else can I do to help prevent falls? Wear shoes that: Do not have high heels. Have rubber bottoms. Are comfortable and fit you well. Are closed at the toe. Do not wear sandals. If you use a stepladder: Make sure that it is fully opened. Do not climb a closed stepladder. Make sure that both sides of the stepladder are locked into place. Ask someone to hold it for you, if possible. Clearly mark and make sure that you can see: Any grab bars or handrails. First and last steps. Where the edge of each step is. Use tools that help you move around (mobility aids) if they are needed. These include: Canes. Walkers. Scooters. Crutches. Turn on the  lights when you go into a dark area. Replace any light bulbs as soon as they burn out. Set up your furniture so you have a clear path. Avoid moving your furniture around. If any of your floors are uneven, fix them. If there are any pets around you, be aware of where they are. Review your medicines with your doctor. Some medicines can make you feel dizzy. This can increase your chance of falling. Ask your doctor what other things that you can do to help prevent falls. This information is not intended to replace advice given to you by your health care provider. Make sure you discuss any questions you have with your health care provider. Document Released: 05/28/2009 Document Revised: 01/07/2016 Document Reviewed: 09/05/2014 Elsevier Interactive Patient Education  2017 [employer].

## 2023-08-01 NOTE — Progress Notes (Signed)
Subjective:   Phillip Maynard. is a 82 y.o. male who presents for Medicare Annual/Subsequent preventive examination.  Visit Complete: Virtual I connected with  Phillip Maynard. on 08/01/23 by a audio enabled telemedicine application and verified that I am speaking with the correct person using two identifiers.  Patient Location: Home  Provider Location: Home Office  I discussed the limitations of evaluation and management by telemedicine. The patient expressed understanding and agreed to proceed.  Vital Signs: Because this visit was a virtual/telehealth visit, some criteria may be missing or patient reported. Any vitals not documented were not able to be obtained and vitals that have been documented are patient reported.  Cardiac Risk Factors include: advanced age (>73men, >64 women);diabetes mellitus;hypertension     Objective:    There were no vitals filed for this visit. There is no height or weight on file to calculate BMI.     08/01/2023   10:49 AM 12/24/2022   12:27 AM 12/22/2022   10:13 AM 12/20/2022    1:17 PM 07/21/2022   12:13 PM 03/12/2022    1:41 PM 11/18/2021    1:33 PM  Advanced Directives  Does Patient Have a Medical Advance Directive? No No No No No No No  Would patient like information on creating a medical advance directive? No - Patient declined  No - Patient declined No - Patient declined No - Patient declined Yes (ED - Information included in AVS) No - Patient declined    Current Medications (verified) Outpatient Encounter Medications as of 08/01/2023  Medication Sig   Accu-Chek Softclix Lancets lancets Use as instructed   albuterol (VENTOLIN HFA) 108 (90 Base) MCG/ACT inhaler TAKE 2 PUFFS BY MOUTH EVERY 6 HOURS AS NEEDED FOR WHEEZE OR SHORTNESS OF BREATH   atorvastatin (LIPITOR) 10 MG tablet Take 1 tablet (10 mg total) by mouth daily.   blood glucose meter kit and supplies Dispense based on patient and insurance preference. Use up to four times daily  as directed. (FOR ICD-10 E10.9, E11.9).   Blood Glucose Monitoring Suppl DEVI 1 each by Does not apply route in the morning, at noon, and at bedtime. May substitute to any manufacturer covered by patient's insurance.   doxycycline (VIBRAMYCIN) 100 MG capsule Take 1 capsule (100 mg total) by mouth every 12 (twelve) hours.   finasteride (PROSCAR) 5 MG tablet TAKE 1 TABLET (5 MG TOTAL) BY MOUTH DAILY.   Fluticasone-Umeclidin-Vilant (TRELEGY ELLIPTA) 100-62.5-25 MCG/ACT AEPB Inhale 1 puff into the lungs daily.   Glucose Blood (BLOOD GLUCOSE TEST STRIPS) STRP 1 each by In Vitro route in the morning, at noon, and at bedtime. May substitute to any manufacturer covered by patient's insurance.   glucose blood test strip Use as instructed up to once per day. Dx.E11.65   Lancet Device MISC 1 each by Does not apply route in the morning, at noon, and at bedtime. May substitute to any manufacturer covered by patient's insurance.   Lancets Misc. MISC 1 each by Does not apply route in the morning, at noon, and at bedtime. May substitute to any manufacturer covered by patient's insurance.   losartan-hydrochlorothiazide (HYZAAR) 50-12.5 MG tablet Take 1 tablet by mouth daily.   tamsulosin (FLOMAX) 0.4 MG CAPS capsule Take 2 capsules (0.8 mg total) by mouth at bedtime. (Patient taking differently: Take 0.4 mg by mouth at bedtime.)   No facility-administered encounter medications on file as of 08/01/2023.    Allergies (verified) Penicillins and Shellfish allergy   History: Past  Medical History:  Diagnosis Date   Asthma    "when I was a boy" (04/08/2013)   Bronchitis    Colon polyps    Colovesical fistula    COPD (chronic obstructive pulmonary disease) (HCC)    Diverticulosis    GERD (gastroesophageal reflux disease)    Hepatitis C    treated with injections   High cholesterol    Hypertension    Type II diabetes mellitus (HCC)    hx of . No longer on medicine   Past Surgical History:  Procedure  Laterality Date   COLONOSCOPY  last 09/06/2016   CYSTOSCOPY N/A 09/08/2016   Procedure: FIREFLY INJECTIONS;  Surgeon: Malen Gauze, MD;  Location: [facility];  Service: Urology;  Laterality: N/A;   CYSTOSCOPY W/ RETROGRADES Bilateral 11/18/2021   Procedure: CYSTOSCOPY WITH RETROGRADE PYELOGRAM;  Surgeon: Malen Gauze, MD;  Location: [facility];  Service: Urology;  Laterality: Bilateral;   CYSTOSCOPY WITH RETROGRADE PYELOGRAM, URETEROSCOPY AND STENT PLACEMENT Bilateral 12/22/2022   Procedure: CYSTOSCOPY WITH RETROGRADE PYELOGRAM;  Surgeon: Malen Gauze, MD;  Location: [facility];  Service: Urology;  Laterality: Bilateral;   INCISION AND DRAINAGE OF WOUND Right 1970's   "leg" (04/08/2013)   LIVER BIOPSY  ~ 2011   POLYPECTOMY     PROCTOSCOPY N/A 09/08/2016   Procedure: RIGID PROCTOSCOPY;  Surgeon: Karie Soda, MD;  Location: [facility];  Service: General;  Laterality: N/A;   TONSILLECTOMY     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   Procedure: TRANSURETHRAL RESECTION OF BLADDER TUMOR (TURBT);  Surgeon: Malen Gauze, MD;  Location: [facility];  Service: Urology;  Laterality: N/A;   TRANSURETHRAL RESECTION OF BLADDER TUMOR N/A 12/22/2022   Procedure: TRANSURETHRAL RESECTION OF BLADDER TUMOR (TURBT);  Surgeon: Malen Gauze, MD;  Location: [facility];  Service: Urology;  Laterality: N/A;   URETERAL BIOPSY Bilateral 12/22/2022   Procedure: URETERAL BIOPSY-selective cytologies;  Surgeon: Malen Gauze, MD;  Location: [facility];  Service: Urology;  Laterality: Bilateral;   Family History  Problem Relation Age of Onset   Asthma Mother    Heart attack Mother    Colon polyps Neg Hx    Colon cancer Neg Hx    Esophageal cancer Neg Hx    Rectal cancer Neg Hx    Stomach cancer Neg Hx    Social History   Socioeconomic History   Marital status: Married    Spouse name: Not on file   Number of children: 2   Years of education: Not on file   Highest education level: Not on file   Occupational History   Occupation: truck Air traffic controller: [employer]    Comment: retired  Tobacco Use   Smoking status: Former    Current packs/day: 0.00    Average packs/day: 0.3 packs/day for 45.0 years (11.3 ttl pk-yrs)    Types: Cigarettes    Start date: 03/05/1976    Quit date: 03/05/2021    Years since quitting: 2.4   Smokeless tobacco: Never  Vaping Use   Vaping status: Never Used  Substance and Sexual Activity   Alcohol use: Yes    Alcohol/week: 12.0 standard drinks of alcohol    Types: 12 Cans of beer per week    Comment: 12 pack a week.   Drug use: No   Sexual activity: Yes  Other Topics Concern   Not on file  Social History Narrative   Pt lives in 1 story home with his wife  Has 2 adult children   Highest level of education: GED & Trade school   Retired Engineer, maintenance.    Social Drivers of Corporate investment banker Strain: Low Risk  (08/01/2023)   Overall Financial Resource Strain (CARDIA)    Difficulty of Paying Living Expenses: Not hard at all  Food Insecurity: No Food Insecurity (08/01/2023)   Hunger Vital Sign    Worried About Running Out of Food in the Last Year: Never true    Ran Out of Food in the Last Year: Never true  Transportation Needs: No Transportation Needs (08/01/2023)   PRAPARE - Administrator, Civil Service (Medical): No    Lack of Transportation (Non-Medical): No  Physical Activity: Inactive (08/01/2023)   Exercise Vital Sign    Days of Exercise per Week: 0 days    Minutes of Exercise per Session: 0 min  Stress: No Stress Concern Present (08/01/2023)   [employer] of Occupational Health - Occupational Stress Questionnaire    Feeling of Stress : Not at all  Social Connections: Moderately Isolated (08/01/2023)   Social Connection and Isolation Panel [NHANES]    Frequency of Communication with Friends and Family: Twice a week    Frequency of Social Gatherings with Friends and Family: Twice a week     Attends Religious Services: Never    Database administrator or Organizations: No    Attends Engineer, structural: Never    Marital Status: Married    Tobacco Counseling Counseling given: Not Answered   Clinical Intake:  Pre-visit preparation completed: Yes  Pain : No/denies pain     Diabetes: Yes CBG done?: No Did pt. bring in CBG monitor from home?: No  How often do you need to have someone help you when you read instructions, pamphlets, or other written materials from your doctor or pharmacy?: 1 - Never  Interpreter Needed?: No  Information entered by :: Remi Haggard LPN   Activities of Daily Living    08/01/2023   10:51 AM 12/20/2022    1:16 PM  In your present state of health, do you have any difficulty performing the following activities:  Hearing? 0 0  Vision? 0 0  Difficulty concentrating or making decisions? 0 0  Walking or climbing stairs? 0 0  Dressing or bathing? 0 0  Doing errands, shopping? 0   Preparing Food and eating ? N   Using the Toilet? N   In the past six months, have you accidently leaked urine? Y   Do you have problems with loss of bowel control? N   Managing your Medications? N   Managing your Finances? N   Housekeeping or managing your Housekeeping? N     Patient Care Team: Shade Flood, MD as PCP - General (Family Medicine) Hildred Laser, MD (Inactive) as Consulting Physician (Urology) Karie Soda, MD as Consulting Physician (General Surgery) Napoleon Form, MD as Consulting Physician (Gastroenterology)  Indicate any recent Medical Services you may have received from other than [facility] providers in the past year (date may be approximate).     Assessment:   This is a routine wellness examination for Poul.  Hearing/Vision screen Hearing Screening - Comments:: No trouble hearing Vision Screening - Comments:: Had cataract surgery  No up to date [facility]   Goals Addressed             This Visit's  Progress    Exercise 3x per week (30 min per time)  On track    Keep up work with walking and increase cardio     Patient Stated       Would like to travel more       Depression Screen    08/01/2023   10:54 AM 07/21/2022   11:59 AM 03/21/2022    9:07 AM 12/09/2021   10:33 AM 12/09/2021   10:32 AM 11/15/2021    9:14 AM 09/29/2021    8:57 AM  PHQ 2/9 Scores  PHQ - 2 Score 0 0 0 0 0 0 0  PHQ- 9 Score 0 2 0 0   0    Fall Risk    08/01/2023   10:48 AM 07/21/2022   11:52 AM 03/21/2022    9:07 AM 11/15/2021    9:14 AM 09/29/2021    8:56 AM  Fall Risk   Falls in the past year? 0 0 0 0 0  Number falls in past yr: 0 0 0 0 0  Injury with Fall? 0 0 0 0 0  Risk for fall due to :   No Fall Risks No Fall Risks No Fall Risks  Follow up Falls evaluation completed;Education provided;Falls prevention discussed Falls evaluation completed;Education provided;Falls prevention discussed Falls evaluation completed Falls evaluation completed Falls evaluation completed    MEDICARE RISK AT HOME: Medicare Risk at Home Any stairs in or around the home?: Yes If so, are there any without handrails?: No Home free of loose throw rugs in walkways, pet beds, electrical cords, etc?: Yes Adequate lighting in your home to reduce risk of falls?: Yes Life alert?: No Use of a cane, walker or w/c?: No Grab bars in the bathroom?: Yes Shower chair or bench in shower?: No Elevated toilet seat or a handicapped toilet?: No  TIMED UP AND GO:  Was the test performed?  No    Cognitive Function:      06/28/2023    4:45 PM 08/04/2017    2:00 PM  Montreal Cognitive Assessment   Visuospatial/ Executive (0/5) 4 5  Naming (0/3) 3 3  Attention: Read list of digits (0/2) 2 2  Attention: Read list of letters (0/1) 1 1  Attention: Serial 7 subtraction starting at 100 (0/3) 2 2  Language: Repeat phrase (0/2) 1 2  Language : Fluency (0/1) 1 1  Abstraction (0/2) 1 2  Delayed Recall (0/5) 1 5  Orientation (0/6) 6 6   Total 22 29  Adjusted Score (based on education) 23       08/01/2023   10:51 AM 07/21/2022   11:56 AM 12/25/2019    9:16 AM 11/19/2018    3:08 PM 05/02/2017    4:50 PM  6CIT Screen  What Year? 0 points 0 points 0 points 0 points 0 points  What month? 0 points 0 points 0 points 0 points 0 points  What time? 0 points 0 points 0 points 0 points 0 points  Count back from 20 0 points 0 points 0 points 0 points 0 points  Months in reverse 0 points 0 points 0 points 0 points 0 points  Repeat phrase 0 points 2 points 0 points 0 points 0 points  Total Score 0 points 2 points 0 points 0 points 0 points    Immunizations Immunization History  Administered Date(s) Administered   Fluad Quad(high Dose 65+) 05/06/2019, 05/04/2020, 05/06/2021   Fluad Trivalent(High Dose 65+) 07/19/2023   Hepatitis A, Ped/Adol-2 Dose 02/20/2009, 03/26/2009   Hepatitis B, PED/ADOLESCENT 02/19/2009, 03/26/2009   Influenza, High  Dose Seasonal PF 09/05/2018   Influenza,inj,Quad PF,6+ Mos 05/27/2013, 09/22/2014, 05/23/2016   PFIZER(Purple Top)SARS-COV-2 Vaccination 10/06/2019, 10/30/2019, 07/23/2020   Pfizer(Comirnaty)Fall Seasonal Vaccine 12 years and older 08/05/2022   Pneumococcal Conjugate-13 01/26/2015   Pneumococcal Polysaccharide-23 01/18/2010   Pneumococcal-Unspecified 04/16/2011   Tdap 06/13/2011, 05/13/2017    TDAP status: Up to date  Flu Vaccine status: Up to date  Pneumococcal vaccine status: Up to date  Covid-19 vaccine status: Declined, Education has been provided regarding the importance of this vaccine but patient still declined. Advised may receive this vaccine at local pharmacy or Health Dept.or vaccine clinic. Aware to provide a copy of the vaccination record if obtained from local pharmacy or Health Dept. Verbalized acceptance and understanding.  Qualifies for Shingles Vaccine? Yes   Zostavax completed No   Shingrix Completed?: No.    Education has been provided regarding the importance of  this vaccine. Patient has been advised to call insurance company to determine out of pocket expense if they have not yet received this vaccine. Advised may also receive vaccine at local pharmacy or Health Dept. Verbalized acceptance and understanding.  Screening Tests Health Maintenance  Topic Date Due   FOOT EXAM  09/29/2022   COVID-19 Vaccine (5 - 2024-25 season) 08/17/2023 (Originally 04/16/2023)   Zoster Vaccines- Shingrix (1 of 2) 10/30/2023 (Originally 01/31/1960)   OPHTHALMOLOGY EXAM  11/07/2023   HEMOGLOBIN A1C  12/26/2023   Diabetic kidney evaluation - eGFR measurement  06/27/2024   Diabetic kidney evaluation - Urine ACR  06/27/2024   Medicare Annual Wellness (AWV)  07/31/2024   DTaP/Tdap/Td (3 - Td or Tdap) 05/14/2027   Pneumonia Vaccine 17+ Years old  Completed   INFLUENZA VACCINE  Completed   HPV VACCINES  Aged Out   Colonoscopy  Discontinued   Hepatitis C Screening  Discontinued    Health Maintenance  Health Maintenance Due  Topic Date Due   FOOT EXAM  09/29/2022    Colorectal cancer screening: No longer required.   Lung Cancer Screening: (Low Dose CT Chest recommended if Age 69-80 years, 20 pack-year currently smoking OR have quit w/in 15years.) does not qualify.   Lung Cancer Screening Referral:   Additional Screening:  Hepatitis C Screening: does not qualify;  Vision Screening: Recommended annual ophthalmology exams for early detection of glaucoma and other disorders of the eye. Is the patient up to date with their annual eye exam?  Yes  Who is the provider or what is the name of the office in which the patient attends annual eye exams? [facility] If pt is not established with a provider, would they like to be referred to a provider to establish care? No .   Dental Screening: Recommended annual dental exams for proper oral hygiene  Nutrition Risk Assessment:  Has the patient had any N/V/D within the last 2 months?  No  Does the patient have any non-healing  wounds?  No  Has the patient had any unintentional weight loss or weight gain?  No   Diabetes:  Is the patient diabetic?  Yes  If diabetic, was a CBG obtained today?  No  Did the patient bring in their glucometer from home?  No  How often do you monitor your CBG's? Continuous monitor.   Financial Strains and Diabetes Management:  Are you having any financial strains with the device, your supplies or your medication? No .   Does the patient want to be seen by Chronic Care Management for management of their diabetes?  No  Diabetic Exams:  Diabetic Eye Exam: Completed .Pt has been advised about the importance in completing this exam  Diabetic Foot Exam: Pt has been advised about the importance in completing this exam.   Community Resource Referral / Chronic Care Management: CRR required this visit?  No   CCM required this visit?  No     Plan:     I have personally reviewed and noted the following in the patient's chart:   Medical and social history Use of alcohol, tobacco or illicit drugs  Current medications and supplements including opioid prescriptions. Patient is not currently taking opioid prescriptions. Functional ability and status Nutritional status Physical activity Advanced directives List of other physicians Hospitalizations, surgeries, and ER visits in previous 12 months Vitals Screenings to include cognitive, depression, and falls Referrals and appointments  In addition, I have reviewed and discussed with patient certain preventive protocols, quality metrics, and best practice recommendations. A written personalized care plan for preventive services as well as general preventive health recommendations were provided to patient.     Remi Haggard, LPN   [id number]   After Visit Summary: (MyChart) Due to this being a telephonic visit, the after visit summary with patients personalized plan was offered to patient via MyChart   Nurse Notes:

## 2023-08-02 ENCOUNTER — Ambulatory Visit (INDEPENDENT_AMBULATORY_CARE_PROVIDER_SITE_OTHER): Payer: 59 | Admitting: Urology

## 2023-08-02 VITALS — BP 133/72 | HR 67 | Ht 69.0 in | Wt 168.0 lb

## 2023-08-02 DIAGNOSIS — C679 Malignant neoplasm of bladder, unspecified: Secondary | ICD-10-CM

## 2023-08-02 DIAGNOSIS — Z8551 Personal history of malignant neoplasm of bladder: Secondary | ICD-10-CM

## 2023-08-02 DIAGNOSIS — N401 Enlarged prostate with lower urinary tract symptoms: Secondary | ICD-10-CM

## 2023-08-02 LAB — URINALYSIS, ROUTINE W REFLEX MICROSCOPIC
Bilirubin, UA: NEGATIVE
Glucose, UA: NEGATIVE
Ketones, UA: NEGATIVE
Leukocytes,UA: NEGATIVE
Nitrite, UA: NEGATIVE
Protein,UA: NEGATIVE
RBC, UA: NEGATIVE
Specific Gravity, UA: 1.02 (ref 1.005–1.030)
Urobilinogen, Ur: 0.2 mg/dL (ref 0.2–1.0)
pH, UA: 6 (ref 5.0–7.5)

## 2023-08-02 MED ORDER — TAMSULOSIN HCL 0.4 MG PO CAPS: 0.8000 mg | ORAL_CAPSULE | Freq: Every day | ORAL | 11 refills | Status: DC

## 2023-08-02 MED ORDER — FINASTERIDE 5 MG PO TABS: 5.0000 mg | ORAL_TABLET | Freq: Every day | ORAL | 3 refills | Status: DC

## 2023-08-02 MED ORDER — CIPROFLOXACIN HCL 500 MG PO TABS
500.0000 mg | ORAL_TABLET | Freq: Once | ORAL | Status: AC
  Administered 2023-08-02: 500 mg via ORAL

## 2023-08-02 MED ORDER — TADALAFIL 20 MG PO TABS: 20.0000 mg | ORAL_TABLET | Freq: Every day | ORAL | 5 refills | Status: DC | PRN

## 2023-08-02 NOTE — Progress Notes (Unsigned)
   08/02/23  CC: No chief complaint on file.   HPI:  Blood pressure 133/72, pulse 67, height 5\' 9"  (1.753 m), weight 168 lb (76.2 kg). NED. A&Ox3.   No respiratory distress   Abd soft, NT, ND Normal phallus with bilateral descended testicles  Cystoscopy Procedure Note  Patient identification was confirmed, informed consent was obtained, and patient was prepped using Betadine solution.  Lidocaine jelly was administered per urethral meatus.     Pre-Procedure: - Inspection reveals a normal caliber ureteral meatus.  Procedure: The flexible cystoscope was introduced without difficulty - No urethral strictures/lesions are present. - {Blank multiple:19197::"Enlarged","Surgically absent","Normal"} prostate *** - {Blank multiple:19197::"Normal","Elevated","Tight"} bladder neck - Bilateral ureteral orifices identified - Bladder mucosa  reveals no ulcers, tumors, or lesions - No bladder stones - No trabeculation  Retroflexion shows ***   Post-Procedure: - Patient tolerated the procedure well  Assessment/ Plan: Followup 3 months for cystoscopy  No follow-ups on file.  Wilkie Aye, MD

## 2023-08-13 DIAGNOSIS — J4540 Moderate persistent asthma, uncomplicated: Secondary | ICD-10-CM | POA: Diagnosis not present

## 2023-08-13 DIAGNOSIS — D721 Eosinophilia, unspecified: Secondary | ICD-10-CM | POA: Diagnosis not present

## 2023-08-15 ENCOUNTER — Encounter: Payer: Self-pay | Admitting: Urology

## 2023-09-13 DIAGNOSIS — D721 Eosinophilia, unspecified: Secondary | ICD-10-CM | POA: Diagnosis not present

## 2023-09-13 DIAGNOSIS — J4540 Moderate persistent asthma, uncomplicated: Secondary | ICD-10-CM | POA: Diagnosis not present

## 2023-09-23 ENCOUNTER — Other Ambulatory Visit: Payer: Self-pay | Admitting: Family Medicine

## 2023-09-23 DIAGNOSIS — I10 Essential (primary) hypertension: Secondary | ICD-10-CM

## 2023-10-13 DIAGNOSIS — J4540 Moderate persistent asthma, uncomplicated: Secondary | ICD-10-CM | POA: Diagnosis not present

## 2023-10-13 DIAGNOSIS — D721 Eosinophilia, unspecified: Secondary | ICD-10-CM | POA: Diagnosis not present

## 2023-10-20 ENCOUNTER — Ambulatory Visit: Payer: 59 | Admitting: Family Medicine

## 2023-10-25 ENCOUNTER — Ambulatory Visit: Payer: 59 | Admitting: Family Medicine

## 2023-10-25 VITALS — BP 124/70 | HR 74 | Temp 98.3°F | Wt 177.4 lb

## 2023-10-25 DIAGNOSIS — R21 Rash and other nonspecific skin eruption: Secondary | ICD-10-CM

## 2023-10-25 DIAGNOSIS — J449 Chronic obstructive pulmonary disease, unspecified: Secondary | ICD-10-CM

## 2023-10-25 DIAGNOSIS — R059 Cough, unspecified: Secondary | ICD-10-CM

## 2023-10-25 DIAGNOSIS — E1165 Type 2 diabetes mellitus with hyperglycemia: Secondary | ICD-10-CM | POA: Diagnosis not present

## 2023-10-25 DIAGNOSIS — J4540 Moderate persistent asthma, uncomplicated: Secondary | ICD-10-CM

## 2023-10-25 LAB — COMPREHENSIVE METABOLIC PANEL
ALT: 8 U/L (ref 0–53)
AST: 13 U/L (ref 0–37)
Albumin: 4.4 g/dL (ref 3.5–5.2)
Alkaline Phosphatase: 52 U/L (ref 39–117)
BUN: 12 mg/dL (ref 6–23)
CO2: 30 meq/L (ref 19–32)
Calcium: 9.8 mg/dL (ref 8.4–10.5)
Chloride: 100 meq/L (ref 96–112)
Creatinine, Ser: 1.08 mg/dL (ref 0.40–1.50)
GFR: 64.71 mL/min (ref >60.00)
Glucose, Bld: 105 mg/dL — ABNORMAL HIGH (ref 70–99)
Potassium: 4.1 meq/L (ref 3.5–5.1)
Sodium: 138 meq/L (ref 135–145)
Total Bilirubin: 0.5 mg/dL (ref 0.2–1.2)
Total Protein: 7.8 g/dL (ref 6.0–8.3)

## 2023-10-25 LAB — HEMOGLOBIN A1C: Hgb A1c MFr Bld: 7.3 % — ABNORMAL HIGH (ref 4.6–6.5)

## 2023-10-25 MED ORDER — TRIAMCINOLONE ACETONIDE 0.1 % EX CREA: 1.0000 | TOPICAL_CREAM | Freq: Two times a day (BID) | CUTANEOUS | 0 refills | Status: DC

## 2023-10-25 MED ORDER — TRELEGY ELLIPTA 200-62.5-25 MCG/ACT IN AEPB: 1.0000 | INHALATION_SPRAY | Freq: Every day | RESPIRATORY_TRACT | 5 refills | Status: DC

## 2023-10-25 MED ORDER — MONTELUKAST SODIUM 10 MG PO TABS: 10.0000 mg | ORAL_TABLET | Freq: Every day | ORAL | 3 refills | Status: DC

## 2023-10-25 MED ORDER — FLUTICASONE PROPIONATE 50 MCG/ACT NA SUSP: 2.0000 | Freq: Every day | NASAL | 6 refills | Status: DC

## 2023-10-25 NOTE — Patient Instructions (Addendum)
 Could be due to COPD or asthma.  Lets go ahead and increase your Trelegy dose, still once per day but will be higher dose medication.  I have added Singulair for possible asthma/allergy component and start Flonase nasal spray for possible allergies as well.  Please call Dr. Francine Graven, pulmonary doctor's office for follow-up appointment.  Depending on labs we can discuss if other changes needed.  Try applying the steroid cream up to twice per day to the rash on your lower leg, along with hydrating lotion like Eucerin or Aveeno.  I would like to recheck that rash in 2 weeks, sooner if any worsening.  Return to the clinic or go to the nearest emergency room if any of your symptoms worsen or new symptoms occur.   Phillip Comas, MD Pulmonologist in [place], [place] Address: [address], [place], [place] [address] Phone: [phone]

## 2023-10-25 NOTE — Progress Notes (Signed)
 Subjective:  Patient ID: Phillip Oliphant., male    DOB: Mar 15, 1941  Age: 83 y.o. MRN: [id number]  CC:  Chief Complaint  Patient presents with   Medical Management of Chronic Issues    Pt is well no question, fasting     HPI Phillip Maynard. presents for   Follow up.   Diabetes: See last visit - off metformin.  Home readings, 112, 135. Up to 175. Ups and downs. Taking test after coffee.  Microalbumin: ratio 10.7 on 06/28/23. On ARB and statin.  Optho, foot exam, pneumovax: utd  Lab Results  Component Value Date   HGBA1C 6.5 06/28/2023   HGBA1C 7.5 (H) 12/16/2021   HGBA1C 7.8 (H) 09/29/2021   Lab Results  Component Value Date   MICROALBUR 13.4 (H) 06/28/2023   LDLCALC 63 06/28/2023   CREATININE 1.08 06/28/2023   Memory changes, mood disorder: Discussed last in 07/2023. Less alcohol - no further drinking. Memory doing better, mood better. Better off alcohol. Sleeping better.   Cough: Fast few months - noted in evening. Back on trelegy daily, for COPD. Notes cough in evening. Here with spouse - seems to have some increased cough in morning and evening.  Some dyspnea at times.  Using albuterol about once per day but not everyday. Releives dyspnea, cough. .  Notices more dyspena when outside, tree exposure, dust exposure.  No current allergy treatments. Some nasal congestion - worse around dust/outside.  Feels like COPD cause. No chest pains.  Hx of moderate persistent asthma, eosinophilia.  Last pulm eval in 2023 -for the increased cough, option of higher dose trelegy.   Rash on left leg  For months treated with vaseline - helps temporarily. Itches.   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 No longer on medicine   Past Surgical History:  Procedure Laterality Date   COLONOSCOPY  last 09/06/2016   CYSTOSCOPY N/A 09/08/2016   Procedure: FIREFLY INJECTIONS;  Surgeon: Malen Gauze, MD;  Location: [facility];  Service: Urology;  Laterality: N/A;   CYSTOSCOPY W/ RETROGRADES Bilateral 11/18/2021   Procedure: CYSTOSCOPY WITH RETROGRADE PYELOGRAM;  Surgeon: Malen Gauze, MD;  Location: [facility];  Service: Urology;  Laterality: Bilateral;   CYSTOSCOPY WITH RETROGRADE PYELOGRAM, URETEROSCOPY AND STENT PLACEMENT Bilateral 12/22/2022   Procedure: CYSTOSCOPY WITH RETROGRADE PYELOGRAM;  Surgeon: Malen Gauze, MD;  Location: [facility];  Service: Urology;  Laterality:  Bilateral;   INCISION AND DRAINAGE OF WOUND Right 1970's   "leg" (04/08/2013)   LIVER BIOPSY  ~ 2011   POLYPECTOMY     PROCTOSCOPY N/A 09/08/2016   Procedure: RIGID PROCTOSCOPY;  Surgeon: Karie Soda, MD;  Location: [facility];  Service: General;  Laterality: N/A;   TONSILLECTOMY     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   Procedure: TRANSURETHRAL RESECTION OF BLADDER TUMOR (TURBT);  Surgeon: Malen Gauze, MD;  Location: [facility];  Service: Urology;   Laterality: N/A;   TRANSURETHRAL RESECTION OF BLADDER TUMOR N/A 12/22/2022   Procedure: TRANSURETHRAL RESECTION OF BLADDER TUMOR (TURBT);  Surgeon: Malen Gauze, MD;  Location: [facility];  Service: Urology;  Laterality: N/A;   URETERAL BIOPSY Bilateral 12/22/2022   Procedure: URETERAL BIOPSY-selective cytologies;  Surgeon: Malen Gauze, MD;  Location: [facility];  Service: Urology;  Laterality: Bilateral;   Allergies  Allergen Reactions   Penicillins Anaphylaxis and Other (See Comments)    Has patient had a PCN reaction causing immediate rash, facial/tongue/throat swelling, SOB or lightheadedness with hypotension: Yes Has patient had a PCN reaction causing severe rash involving mucus membranes or skin necrosis: No Has patient had a PCN reaction that required hospitalization No Has patient had a PCN reaction occurring within the last 10 years: No If all of the above answers are "NO", then may proceed with Cephalosporin use.   Shellfish Allergy Anaphylaxis    Shrimp  crabs   Prior to Admission medications   Medication Sig Start Date End Date Taking? Authorizing Provider  Accu-Chek Softclix Lancets lancets Use as instructed 03/21/22  Yes Shade Flood, MD  albuterol (VENTOLIN HFA) 108 (90 Base) MCG/ACT inhaler TAKE 2 PUFFS BY MOUTH EVERY 6 HOURS AS NEEDED FOR WHEEZE OR SHORTNESS OF BREATH 05/25/23  Yes Shade Flood, MD  atorvastatin (LIPITOR) 10 MG tablet Take 1 tablet (10 mg total) by mouth daily. 06/28/23  Yes Shade Flood, MD  blood glucose meter kit and supplies Dispense based on patient and insurance preference. Use up to four times daily as directed. (FOR ICD-10 E10.9, E11.9). 09/29/21  Yes Shade Flood, MD  Blood Glucose Monitoring Suppl DEVI 1 each by Does not apply route in the morning, at noon, and at bedtime. May substitute to any manufacturer covered by patient's insurance. 07/19/23  Yes Shade Flood, MD  doxycycline (VIBRAMYCIN) 100 MG capsule Take 1 capsule  (100 mg total) by mouth every 12 (twelve) hours. 05/03/23  Yes McKenzie, Mardene Celeste, MD  finasteride (PROSCAR) 5 MG tablet Take 1 tablet (5 mg total) by mouth daily. 08/02/23  Yes McKenzie, Mardene Celeste, MD  Fluticasone-Umeclidin-Vilant (TRELEGY ELLIPTA) 100-62.5-25 MCG/ACT AEPB Inhale 1 puff into the lungs daily. 06/28/23  Yes Shade Flood, MD  glucose blood test strip Use as instructed up to once per day. Dx.E11.65 07/21/22  Yes Shade Flood, MD  losartan-hydrochlorothiazide (HYZAAR) 50-12.5 MG tablet Take 1 tablet by mouth daily. 06/09/23  Yes Shade Flood, MD  tadalafil (CIALIS) 20 MG tablet Take 1 tablet (20 mg total) by mouth daily as needed. 08/02/23  Yes McKenzie, Mardene Celeste, MD  tamsulosin (FLOMAX) 0.4 MG CAPS capsule Take 2 capsules (0.8 mg total) by mouth at bedtime. 08/02/23  Yes McKenzie, Mardene Celeste, MD   Social History   Socioeconomic History   Marital status: Married    Spouse name: Not on file   Number of children: 2   Years of education: Not on file  Highest education level: Not on file  Occupational History   Occupation: truck Air traffic controller: [employer]    Comment: retired  Tobacco Use   Smoking status: Former    Current packs/day: 0.00    Average packs/day: 0.3 packs/day for 45.0 years (11.3 ttl pk-yrs)    Types: Cigarettes    Start date: 03/05/1976    Quit date: 03/05/2021    Years since quitting: 2.6   Smokeless tobacco: Never  Vaping Use   Vaping status: Never Used  Substance and Sexual Activity   Alcohol use: Yes    Alcohol/week: 12.0 standard drinks of alcohol    Types: 12 Cans of beer per week    Comment: 12 pack a week.   Drug use: No   Sexual activity: Yes  Other Topics Concern   Not on file  Social History Narrative   Pt lives in 1 story home with his wife   Has 2 adult children   Highest level of education: GED & Trade school   Retired Engineer, maintenance.    Social Drivers of Corporate investment banker Strain: Low Risk   (08/01/2023)   Overall Financial Resource Strain (CARDIA)    Difficulty of Paying Living Expenses: Not hard at all  Food Insecurity: No Food Insecurity (08/01/2023)   Hunger Vital Sign    Worried About Running Out of Food in the Last Year: Never true    Ran Out of Food in the Last Year: Never true  Transportation Needs: No Transportation Needs (08/01/2023)   PRAPARE - Administrator, Civil Service (Medical): No    Lack of Transportation (Non-Medical): No  Physical Activity: Inactive (08/01/2023)   Exercise Vital Sign    Days of Exercise per Week: 0 days    Minutes of Exercise per Session: 0 min  Stress: No Stress Concern Present (08/01/2023)   [employer] of Occupational Health - Occupational Stress Questionnaire    Feeling of Stress : Not at all  Social Connections: Moderately Isolated (08/01/2023)   Social Connection and Isolation Panel [NHANES]    Frequency of Communication with Friends and Family: Twice a week    Frequency of Social Gatherings with Friends and Family: Twice a week    Attends Religious Services: Never    Database administrator or Organizations: No    Attends Banker Meetings: Never    Marital Status: Married  Catering manager Violence: Not At Risk (08/01/2023)   Humiliation, Afraid, Rape, and Kick questionnaire    Fear of Current or Ex-Partner: No    Emotionally Abused: No    Physically Abused: No    Sexually Abused: No    Review of Systems  Per HPI Objective:   Vitals:   10/25/23 0955  BP: 124/70  Pulse: 74  Temp: 98.3 F (36.8 C)  TempSrc: Temporal  SpO2: 97%  Weight: 177 lb 6.4 oz (80.5 kg)     Physical Exam Vitals reviewed.  Constitutional:      Appearance: He is well-developed.  HENT:     Head: Normocephalic and atraumatic.  Neck:     Vascular: No carotid bruit or JVD.  Cardiovascular:     Rate and Rhythm: Normal rate and regular rhythm.     Heart sounds: Normal heart sounds. No murmur  heard. Pulmonary:     Effort: Pulmonary effort is normal.     Breath sounds: Wheezing (Faint end expiratory, intermittent, normal effort, no distress.  No  rhonchi or rales appreciated.  Speaking full sentences.) present. No rales.  Musculoskeletal:     Right lower leg: No edema.     Left lower leg: No edema.  Skin:    General: Skin is warm and dry.     Comments: See photo, hyperpigmented slightly excoriated rash of left lower extremity.  Neurological:     Mental Status: He is alert and oriented to person, place, and time.  Psychiatric:        Mood and Affect: Mood normal.         Assessment & Plan:  Phillip Nanda. is a 83 y.o. male . Type 2 diabetes mellitus with hyperglycemia, without long-term current use of insulin (HCC) - Plan: Comprehensive metabolic panel, Hemoglobin A1c  -Check labs adjust regimen accordingly.  Chronic obstructive pulmonary disease, unspecified COPD type (HCC) - Plan: Fluticasone-Umeclidin-Vilant (TRELEGY ELLIPTA) 200-62.5-25 MCG/ACT AEPB Moderate persistent asthma, unspecified whether complicated - Plan: montelukast (SINGULAIR) 10 MG tablet, fluticasone (FLONASE) 50 MCG/ACT nasal spray Cough, unspecified type - Plan: montelukast (SINGULAIR) 10 MG tablet, fluticasone (FLONASE) 50 MCG/ACT nasal spray  -COPD versus asthma versus component of allergy contributor to cough.  Continue Trelegy, add other meds below for possible allergy component of cough.  Dosage of Trelegy was increased for improved control.  Add Singulair.  Flonase nasal spray.  Pulmonary follow-up recommended as overdue.  RTC precautions.  Rash and nonspecific skin eruption - Plan: triamcinolone cream (KENALOG) 0.1 %  -See photo above.  With pruritic component, question eczema, dry skin contributor.  Hydrating lotion with Eucerin or Aveeno over-the-counter and recheck next few weeks.  RTC precautions, consider Derm eval, consider biopsy.  Meds ordered this encounter  Medications    montelukast (SINGULAIR) 10 MG tablet    Sig: Take 1 tablet (10 mg total) by mouth at bedtime.    Dispense:  30 tablet    Refill:  3   fluticasone (FLONASE) 50 MCG/ACT nasal spray    Sig: Place 2 sprays into both nostrils daily.    Dispense:  16 g    Refill:  6   triamcinolone cream (KENALOG) 0.1 %    Sig: Apply 1 Application topically 2 (two) times daily.    Dispense:  30 g    Refill:  0   Fluticasone-Umeclidin-Vilant (TRELEGY ELLIPTA) 200-62.5-25 MCG/ACT AEPB    Sig: Inhale 1 puff into the lungs daily.    Dispense:  28 each    Refill:  5   Patient Instructions  Could be due to COPD or asthma.  Lets go ahead and increase your Trelegy dose, still once per day but will be higher dose medication.  I have added Singulair for possible asthma/allergy component and start Flonase nasal spray for possible allergies as well.  Please call Dr. Francine Graven, pulmonary doctor's office for follow-up appointment.  Depending on labs we can discuss if other changes needed.  Try applying the steroid cream up to twice per day to the rash on your lower leg, along with hydrating lotion like Eucerin or Aveeno.  I would like to recheck that rash in 2 weeks, sooner if any worsening.  Return to the clinic or go to the nearest emergency room if any of your symptoms worsen or new symptoms occur.   Melody Comas, MD Pulmonologist in [place], [place] Address: [address], [place], [place] [address] Phone: [phone]    Signed,   Meredith Staggers, MD [facility], [facility] 10/25/23 11:20  AM

## 2023-10-28 ENCOUNTER — Encounter: Payer: Self-pay | Admitting: Family Medicine

## 2023-10-31 ENCOUNTER — Other Ambulatory Visit: Payer: Self-pay | Admitting: Urology

## 2023-10-31 ENCOUNTER — Other Ambulatory Visit: Payer: Self-pay | Admitting: Family Medicine

## 2023-10-31 DIAGNOSIS — I10 Essential (primary) hypertension: Secondary | ICD-10-CM

## 2023-10-31 DIAGNOSIS — E1165 Type 2 diabetes mellitus with hyperglycemia: Secondary | ICD-10-CM

## 2023-10-31 DIAGNOSIS — J449 Chronic obstructive pulmonary disease, unspecified: Secondary | ICD-10-CM

## 2023-10-31 DIAGNOSIS — R21 Rash and other nonspecific skin eruption: Secondary | ICD-10-CM

## 2023-11-06 ENCOUNTER — Ambulatory Visit: Payer: 59 | Admitting: Urology

## 2023-11-06 VITALS — BP 131/53 | HR 63

## 2023-11-06 DIAGNOSIS — Z8551 Personal history of malignant neoplasm of bladder: Secondary | ICD-10-CM

## 2023-11-06 DIAGNOSIS — C679 Malignant neoplasm of bladder, unspecified: Secondary | ICD-10-CM

## 2023-11-06 LAB — URINALYSIS, ROUTINE W REFLEX MICROSCOPIC
Bilirubin, UA: NEGATIVE
Glucose, UA: NEGATIVE
Ketones, UA: NEGATIVE
Leukocytes,UA: NEGATIVE
Nitrite, UA: NEGATIVE
Protein,UA: NEGATIVE
RBC, UA: NEGATIVE
Specific Gravity, UA: 1.01 (ref 1.005–1.030)
Urobilinogen, Ur: 0.2 mg/dL (ref 0.2–1.0)
pH, UA: 6 (ref 5.0–7.5)

## 2023-11-06 MED ORDER — CIPROFLOXACIN HCL 500 MG PO TABS
500.0000 mg | ORAL_TABLET | Freq: Once | ORAL | Status: AC
Start: 2023-11-06 — End: 2023-11-06
  Administered 2023-11-06: 500 mg via ORAL

## 2023-11-06 NOTE — Progress Notes (Signed)
   11/06/23  CC: followup bladder cancer   HPI: Phillip Maynard is a 82yo here for followup for bladder cancer Blood pressure (!) 131/53, pulse 63. NED. A&Ox3.   No respiratory distress   Abd soft, NT, ND Normal phallus with bilateral descended testicles  Cystoscopy Procedure Note  Patient identification was confirmed, informed consent was obtained, and patient was prepped using Betadine solution.  Lidocaine jelly was administered per urethral meatus.     Pre-Procedure: - Inspection reveals a normal caliber ureteral meatus.  Procedure: The flexible cystoscope was introduced without difficulty - No urethral strictures/lesions are present. - Enlarged prostate  - Normal bladder neck - Bilateral ureteral orifices identified - Bladder mucosa  reveals no ulcers, tumors, or lesions - No bladder stones - No trabeculation     Post-Procedure: - Patient tolerated the procedure well  Assessment/ Plan: Followup 3 months for cystoscopy  No follow-ups on file.  Wilkie Aye, MD

## 2023-11-08 ENCOUNTER — Encounter: Payer: Self-pay | Admitting: Family Medicine

## 2023-11-08 ENCOUNTER — Ambulatory Visit (INDEPENDENT_AMBULATORY_CARE_PROVIDER_SITE_OTHER): Admitting: Family Medicine

## 2023-11-08 VITALS — BP 126/70 | HR 54 | Temp 97.7°F | Ht 69.0 in | Wt 173.4 lb

## 2023-11-08 DIAGNOSIS — R059 Cough, unspecified: Secondary | ICD-10-CM

## 2023-11-08 DIAGNOSIS — J4540 Moderate persistent asthma, uncomplicated: Secondary | ICD-10-CM | POA: Diagnosis not present

## 2023-11-08 DIAGNOSIS — J449 Chronic obstructive pulmonary disease, unspecified: Secondary | ICD-10-CM

## 2023-11-08 DIAGNOSIS — R21 Rash and other nonspecific skin eruption: Secondary | ICD-10-CM

## 2023-11-08 NOTE — Patient Instructions (Signed)
 Lungs are clear today.  Glad to hear that your cough is improved.  No change in meds for now, keep follow-up with pulmonary as planned next month.  Skin also looks better on your leg.  Okay to use the steroid cream up to twice per day as needed for itching, but I would like to see that area to continue to improve, and start to lighten over the next few weeks.  If you notice any persistent rash in that area after the next 2 to 4 weeks, let me know and I will refer you to dermatology.  Hydrating lotion like Eucerin daily can still be used as well.  Take care!

## 2023-11-08 NOTE — Progress Notes (Signed)
 Subjective:  Patient ID: Phillip Oliphant., male    DOB: [birth year]  Age: 83 y.o. MRN: [id number]  CC:  Chief Complaint  Patient presents with   Rash    Notes rash is nearly resolved, feels well.    Cough    Is mostly resolved has occasional bouts     HPI Phillip Maynard. presents for    Left leg rash Discussed at his March 12 visit.  Had noted rash on the lower left leg for a few months, intermittent itching, had treated with Vaseline with temporary relief.  See photo from that visit, slightly excoriated, hyperpigmented rash of lower extremity.  With pruritus, possible dry skin component or eczematous rash.  Initial treatment of hydrating lotion, and triamcinolone topical as needed. Rash has improved. Only itching on occasion. Using steroid cream BID, using Eucerin BID as well.   Cough History of COPD.  Moderate persistent asthma.  Dose of Trelegy was increased at his March 12 visit for improved control and added Singulair for possible allergic component along with Flonase nasal spray.  Also recommended pulmonary follow-up as he was overdue. Cough has improved. Much better.  Higher dose Trelegy has been helpful. Taking singulair daily.  Pulmonary appt 4/30.  Not needing albuterol daily - only once per week.   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 No longer on medicine   Past Surgical History:  Procedure Laterality Date   COLONOSCOPY  last 09/06/2016   CYSTOSCOPY N/A 09/08/2016   Procedure: FIREFLY INJECTIONS;  Surgeon: Malen Gauze, MD;  Location: [facility];  Service: Urology;  Laterality: N/A;   CYSTOSCOPY W/ RETROGRADES Bilateral 11/18/2021   Procedure: CYSTOSCOPY WITH RETROGRADE PYELOGRAM;  Surgeon: Malen Gauze, MD;  Location: [facility];  Service: Urology;  Laterality: Bilateral;   CYSTOSCOPY WITH RETROGRADE PYELOGRAM, URETEROSCOPY AND STENT PLACEMENT Bilateral 12/22/2022   Procedure: CYSTOSCOPY WITH RETROGRADE PYELOGRAM;  Surgeon: Malen Gauze, MD;  Location: [facility];  Service: Urology;  Laterality: Bilateral;   INCISION AND DRAINAGE OF WOUND Right 1970's   "leg" (04/08/2013)   LIVER BIOPSY  ~ 2011   POLYPECTOMY     PROCTOSCOPY N/A 09/08/2016   Procedure: RIGID PROCTOSCOPY;  Surgeon: Karie Soda, MD;  Location: [facility];  Service: General;  Laterality: N/A;   TONSILLECTOMY     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   Procedure: TRANSURETHRAL RESECTION OF  BLADDER TUMOR (TURBT);  Surgeon: Malen Gauze, MD;  Location: [facility];  Service: Urology;  Laterality: N/A;   TRANSURETHRAL RESECTION OF BLADDER TUMOR N/A 12/22/2022   Procedure: TRANSURETHRAL RESECTION OF BLADDER TUMOR (TURBT);  Surgeon: Malen Gauze, MD;  Location: [facility];  Service: Urology;  Laterality: N/A;   URETERAL BIOPSY Bilateral 12/22/2022   Procedure: URETERAL BIOPSY-selective cytologies;  Surgeon: Malen Gauze, MD;  Location: [facility];   Service: Urology;  Laterality: Bilateral;   Allergies  Allergen Reactions   Penicillins Anaphylaxis and Other (See Comments)    Has patient had a PCN reaction causing immediate rash, facial/tongue/throat swelling, SOB or lightheadedness with hypotension: Yes Has patient had a PCN reaction causing severe rash involving mucus membranes or skin necrosis: No Has patient had a PCN reaction that required hospitalization No Has patient had a PCN reaction occurring within the last 10 years: No If all of the above answers are "NO", then may proceed with Cephalosporin use.   Shellfish Allergy Anaphylaxis    Shrimp  crabs   Prior to Admission medications   Medication Sig Start Date End Date Taking? Authorizing Provider  Accu-Chek Softclix Lancets lancets Use as instructed 03/21/22  Yes Shade Flood, MD  albuterol (VENTOLIN HFA) 108 (90 Base) MCG/ACT inhaler TAKE 2 PUFFS BY MOUTH EVERY 6 HOURS AS NEEDED FOR WHEEZE OR SHORTNESS OF BREATH 05/25/23  Yes Shade Flood, MD  atorvastatin (LIPITOR) 10 MG tablet Take 1 tablet (10 mg total) by mouth daily. 06/28/23  Yes Shade Flood, MD  blood glucose meter kit and supplies Dispense based on patient and insurance preference. Use up to four times daily as directed. (FOR ICD-10 E10.9, E11.9). 09/29/21  Yes Shade Flood, MD  Blood Glucose Monitoring Suppl DEVI 1 each by Does not apply route in the morning, at noon, and at bedtime. May substitute to any manufacturer covered by patient's insurance. 07/19/23  Yes Shade Flood, MD  finasteride (PROSCAR) 5 MG tablet Take 1 tablet (5 mg total) by mouth daily. 08/02/23  Yes McKenzie, Mardene Celeste, MD  fluticasone (FLONASE) 50 MCG/ACT nasal spray Place 2 sprays into both nostrils daily. 10/25/23  Yes Shade Flood, MD  Fluticasone-Umeclidin-Vilant (TRELEGY ELLIPTA) 200-62.5-25 MCG/ACT AEPB Inhale 1 puff into the lungs daily. 10/25/23  Yes Shade Flood, MD  glucose blood test strip Use as instructed  up to once per day. Dx.E11.65 07/21/22  Yes Shade Flood, MD  losartan-hydrochlorothiazide (HYZAAR) 50-12.5 MG tablet Take 1 tablet by mouth daily. 06/09/23  Yes Shade Flood, MD  montelukast (SINGULAIR) 10 MG tablet Take 1 tablet (10 mg total) by mouth at bedtime. 10/25/23  Yes Shade Flood, MD  tadalafil (CIALIS) 20 MG tablet Take 1 tablet (20 mg total) by mouth daily as needed. 08/02/23  Yes McKenzie, Mardene Celeste, MD  tamsulosin (FLOMAX) 0.4 MG CAPS capsule Take 2 capsules (0.8 mg total) by mouth at bedtime. 08/02/23  Yes McKenzie, Mardene Celeste, MD  triamcinolone cream (KENALOG) 0.1 % Apply 1 Application topically 2 (two) times daily. 10/25/23  Yes Shade Flood, MD   Social History   Socioeconomic History   Marital status: Married    Spouse name: Not on file   Number of children: 2   Years of education: Not on file   Highest education level: Not on file  Occupational History   Occupation: truck driver    Employer: [employer]    Comment: retired  Tobacco Use  Smoking status: Former    Current packs/day: 0.00    Average packs/day: 0.3 packs/day for 45.0 years (11.3 ttl pk-yrs)    Types: Cigarettes    Start date: 03/05/1976    Quit date: 03/05/2021    Years since quitting: 2.6   Smokeless tobacco: Never  Vaping Use   Vaping status: Never Used  Substance and Sexual Activity   Alcohol use: Yes    Alcohol/week: 12.0 standard drinks of alcohol    Types: 12 Cans of beer per week    Comment: 12 pack a week.   Drug use: No   Sexual activity: Yes  Other Topics Concern   Not on file  Social History Narrative   Pt lives in 1 story home with his wife   Has 2 adult children   Highest level of education: GED & Trade school   Retired Engineer, maintenance.    Social Drivers of Corporate investment banker Strain: Low Risk  (08/01/2023)   Overall Financial Resource Strain (CARDIA)    Difficulty of Paying Living Expenses: Not hard at all  Food Insecurity: No Food  Insecurity (08/01/2023)   Hunger Vital Sign    Worried About Running Out of Food in the Last Year: Never true    Ran Out of Food in the Last Year: Never true  Transportation Needs: No Transportation Needs (08/01/2023)   PRAPARE - Administrator, Civil Service (Medical): No    Lack of Transportation (Non-Medical): No  Physical Activity: Inactive (08/01/2023)   Exercise Vital Sign    Days of Exercise per Week: 0 days    Minutes of Exercise per Session: 0 min  Stress: No Stress Concern Present (08/01/2023)   [employer] of Occupational Health - Occupational Stress Questionnaire    Feeling of Stress : Not at all  Social Connections: Moderately Isolated (08/01/2023)   Social Connection and Isolation Panel [NHANES]    Frequency of Communication with Friends and Family: Twice a week    Frequency of Social Gatherings with Friends and Family: Twice a week    Attends Religious Services: Never    Database administrator or Organizations: No    Attends Banker Meetings: Never    Marital Status: Married  Catering manager Violence: Not At Risk (08/01/2023)   Humiliation, Afraid, Rape, and Kick questionnaire    Fear of Current or Ex-Partner: No    Emotionally Abused: No    Physically Abused: No    Sexually Abused: No    Review of Systems Per HPI  Objective:   Vitals:   11/08/23 1020  BP: 126/70  Pulse: (!) 54  Temp: 97.7 F (36.5 C)  TempSrc: Temporal  SpO2: 96%  Weight: 173 lb 6.4 oz (78.7 kg)  Height: 5\' 9"  (1.753 m)     Physical Exam Vitals reviewed.  Constitutional:      Appearance: He is well-developed.  HENT:     Head: Normocephalic and atraumatic.  Neck:     Vascular: No carotid bruit or JVD.  Cardiovascular:     Rate and Rhythm: Normal rate and regular rhythm.     Heart sounds: Normal heart sounds. No murmur heard. Pulmonary:     Effort: Pulmonary effort is normal. No respiratory distress.     Breath sounds: Normal breath sounds. No  stridor. No wheezing, rhonchi or rales.  Musculoskeletal:     Right lower leg: No edema.     Left lower leg: No edema.  Skin:    General: Skin is warm and dry.     Findings: Rash (See photo, hyperpigmented patch left lower leg but skin intact, no induration, no appreciable erythema, and dry skin has improved.) present.  Neurological:     Mental Status: He is alert and oriented to person, place, and time.  Psychiatric:        Mood and Affect: Mood normal.      Assessment & Plan:  Phillip Maynard. is a 83 y.o. male . Rash and nonspecific skin eruption  -Improved, continue topical steroid cream, hydrating lotion and if not continuing to improve, resolve in the next 2 to 4 weeks, would consider dermatology eval.  Advised to let me know and I can place that referral.  RTC precautions if worsening sooner.  Chronic obstructive pulmonary disease, unspecified COPD type (HCC) Moderate persistent asthma, unspecified whether complicated Cough, unspecified type  -Improved with higher dose Trelegy, addition of Singulair, treatment with Flonase.  Lungs clear in office.  Continue follow-up with pulmonary as planned, no med changes for now.  RTC precautions.  No orders of the defined types were placed in this encounter.  Patient Instructions  Lungs are clear today.  Glad to hear that your cough is improved.  No change in meds for now, keep follow-up with pulmonary as planned next month.  Skin also looks better on your leg.  Okay to use the steroid cream up to twice per day as needed for itching, but I would like to see that area to continue to improve, and start to lighten over the next few weeks.  If you notice any persistent rash in that area after the next 2 to 4 weeks, let me know and I will refer you to dermatology.  Hydrating lotion like Eucerin daily can still be used as well.  Take care!    Signed,   Meredith Staggers, MD [facility], [facility] 11/08/23 11:11 AM

## 2023-11-11 ENCOUNTER — Encounter: Payer: Self-pay | Admitting: Urology

## 2023-11-11 DIAGNOSIS — J4540 Moderate persistent asthma, uncomplicated: Secondary | ICD-10-CM | POA: Diagnosis not present

## 2023-11-11 DIAGNOSIS — D721 Eosinophilia, unspecified: Secondary | ICD-10-CM | POA: Diagnosis not present

## 2023-11-11 NOTE — Patient Instructions (Signed)

## 2023-12-12 DIAGNOSIS — D721 Eosinophilia, unspecified: Secondary | ICD-10-CM | POA: Diagnosis not present

## 2023-12-12 DIAGNOSIS — J4540 Moderate persistent asthma, uncomplicated: Secondary | ICD-10-CM | POA: Diagnosis not present

## 2023-12-13 ENCOUNTER — Encounter: Payer: Self-pay | Admitting: Pulmonary Disease

## 2023-12-13 ENCOUNTER — Ambulatory Visit: Admitting: Pulmonary Disease

## 2023-12-13 VITALS — BP 128/56 | Ht 69.0 in | Wt 172.0 lb

## 2023-12-13 DIAGNOSIS — D721 Eosinophilia, unspecified: Secondary | ICD-10-CM | POA: Diagnosis not present

## 2023-12-13 DIAGNOSIS — J4540 Moderate persistent asthma, uncomplicated: Secondary | ICD-10-CM

## 2023-12-13 LAB — CBC WITH DIFFERENTIAL/PLATELET
Basophils Absolute: 0.1 10*3/uL (ref 0.0–0.1)
Basophils Relative: 0.9 % (ref 0.0–3.0)
Eosinophils Absolute: 1.7 10*3/uL — ABNORMAL HIGH (ref 0.0–0.7)
Eosinophils Relative: 20.7 % — ABNORMAL HIGH (ref 0.0–5.0)
HCT: 43.1 % (ref 39.0–52.0)
Hemoglobin: 14.3 g/dL (ref 13.0–17.0)
Lymphocytes Relative: 32.4 % (ref 12.0–46.0)
Lymphs Abs: 2.7 10*3/uL (ref 0.7–4.0)
MCHC: 33.2 g/dL (ref 30.0–36.0)
MCV: 90.7 fl (ref 78.0–100.0)
Monocytes Absolute: 0.7 10*3/uL (ref 0.1–1.0)
Monocytes Relative: 8.3 % (ref 3.0–12.0)
Neutro Abs: 3.1 10*3/uL (ref 1.4–7.7)
Neutrophils Relative %: 37.7 % — ABNORMAL LOW (ref 43.0–77.0)
Platelets: 257 10*3/uL (ref 150.0–400.0)
RBC: 4.75 Mil/uL (ref 4.22–5.81)
RDW: 14 % (ref 11.5–15.5)
WBC: 8.3 10*3/uL (ref 4.0–10.5)

## 2023-12-13 MED ORDER — BUDESONIDE 0.5 MG/2ML IN SUSP: 0.5000 mg | Freq: Two times a day (BID) | RESPIRATORY_TRACT | 11 refills | Status: DC

## 2023-12-13 NOTE — Patient Instructions (Addendum)
 We will order you a nebulizer machine and supplies today  Start budesonide nebulizer twice daily  Continue trelegy ellipta  200, 1 puff daily - rinse mouth out after each use  Continue montelukast  10mg  daily  We will check labs today for IgE and eosinophil levels  Follow up in 3 months

## 2023-12-13 NOTE — Progress Notes (Signed)
 Synopsis: Referred in March 2023 for COPD by Caro Christmas, MD  Subjective:   PATIENT ID: Phillip Maynard. GENDER: male DOB: Jan 01, 1941, MRN: [id number]  HPI  Chief Complaint  Patient presents with   Follow-up   Phillip Maynard is a 83 year old male, former smoker with GERD, hypertension and DMII who returns to pulmonary clinic for asthma.  He reports cough over recent months. Trelegy inhaler increased to 200mcg dosing with some improvement in cough. He has been doing yard work and mowing grass which can lead to wheezing and increase cough/mucous production.   He remains on montelukast  daily.    OV 03/2022 He was started on trelelgy ellipta at last visit with improvement of his symptoms. He continues to have cough, mucous production and intermittent wheezing though.  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 Evaluation is not concerning for other etiologies of elevated eosinophils at this time other than what is likely related to his asthma.   OV 12/27/21 He is using flovent  110mcg 2 puffs twice daily and did notice improvement in his breathing with the addition of stiolto inhaler after last visit. He is using albuterol  a couple of times per week.   PFTs are within normal limits today.   OV 10/26/21 Patient is having exertional dyspnea when walking to his mail box that has a slight incline in the driveway. Over recent weeks he is needing to stop to rest due to the shortness of breath. He does experience intermittent wheezing, mainly dry cough but will have some clear mucous production every once in a while. He denies night time awakenings due to shortness of breath, cough or wheezing. He has gained about 12lbs over the last year.   He has been using flovent  110mcg 2 puffs twice daily and spiriva  have helped improve his breathing. He reports taking prednisone  3 weeks ago which helped his breathing and cough.   He quit smoking last year. He was smoking  0.5 to 1 pack per day for 30 years. 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 He wore protective gear for the most part. He lives with his wife. His mother had asthma and his brother and sisters have asthma.   He is having dental surgery in the near future. No issues with anesthesia in the past.   Past Medical History:  Diagnosis Date   Asthma    "when I was a boy" (04/08/2013)   Bronchitis    Colon polyps    Colovesical fistula    COPD (chronic obstructive pulmonary disease) (HCC)    Diverticulosis    GERD (gastroesophageal reflux disease)    Hepatitis C    treated with injections   High cholesterol    Hypertension    Type II diabetes mellitus (HCC)    hx of . No longer on medicine     Family History  Problem Relation Age of Onset   Asthma Mother    Heart attack Mother    Colon polyps Neg Hx    Colon cancer Neg Hx    Esophageal cancer Neg Hx    Rectal cancer Neg Hx    Stomach cancer Neg Hx      Social History   Socioeconomic History   Marital status: Married    Spouse name: Not on file   Number of children: 2   Years of education: Not on file   Highest education level: Not  on file  Occupational History   Occupation: truck Air traffic controller: [employer]    Comment: retired  Tobacco Use   Smoking status: Former    Current packs/day: 0.00    Average packs/day: 0.3 packs/day for 45.0 years (11.3 ttl pk-yrs)    Types: Cigarettes    Start date: 03/05/1976    Quit date: 03/05/2021    Years since quitting: 2.7   Smokeless tobacco: Never  Vaping Use   Vaping status: Never Used  Substance and Sexual Activity   Alcohol use: Yes    Alcohol/week: 12.0 standard drinks of alcohol    Types: 12 Cans of beer per week    Comment: 12 pack a week.   Drug use: No   Sexual activity: Yes  Other Topics Concern   Not on file  Social History Narrative   Pt lives in 1 story home with his wife   Has 2 adult children   Highest level of  education: GED & Trade school   Retired Engineer, maintenance.    Social Drivers of Corporate investment banker Strain: Low Risk  (08/01/2023)   Overall Financial Resource Strain (CARDIA)    Difficulty of Paying Living Expenses: Not hard at all  Food Insecurity: No Food Insecurity (08/01/2023)   Hunger Vital Sign    Worried About Running Out of Food in the Last Year: Never true    Ran Out of Food in the Last Year: Never true  Transportation Needs: No Transportation Needs (08/01/2023)   PRAPARE - Administrator, Civil Service (Medical): No    Lack of Transportation (Non-Medical): No  Physical Activity: Inactive (08/01/2023)   Exercise Vital Sign    Days of Exercise per Week: 0 days    Minutes of Exercise per Session: 0 min  Stress: No Stress Concern Present (08/01/2023)   [employer] of Occupational Health - Occupational Stress Questionnaire    Feeling of Stress : Not at all  Social Connections: Moderately Isolated (08/01/2023)   Social Connection and Isolation Panel [NHANES]    Frequency of Communication with Friends and Family: Twice a week    Frequency of Social Gatherings with Friends and Family: Twice a week    Attends Religious Services: Never    Database administrator or Organizations: No    Attends Banker Meetings: Never    Marital Status: Married  Catering manager Violence: Not At Risk (08/01/2023)   Humiliation, Afraid, Rape, and Kick questionnaire    Fear of Current or Ex-Partner: No    Emotionally Abused: No    Physically Abused: No    Sexually Abused: No     Allergies  Allergen Reactions   Penicillins Anaphylaxis and Other (See Comments)    Has patient had a PCN reaction causing immediate rash, facial/tongue/throat swelling, SOB or lightheadedness with hypotension: Yes Has patient had a PCN reaction causing severe rash involving mucus membranes or skin necrosis: No Has patient had a PCN reaction that required hospitalization  No Has patient had a PCN reaction occurring within the last 10 years: No If all of the above answers are "NO", then may proceed with Cephalosporin use.   Shellfish Allergy Anaphylaxis    Shrimp  crabs     Outpatient Medications Prior to Visit  Medication Sig Dispense Refill   Accu-Chek Softclix Lancets lancets Use as instructed 100 each 6   albuterol  (VENTOLIN  HFA) 108 (90 Base) MCG/ACT inhaler TAKE 2 PUFFS BY MOUTH  EVERY 6 HOURS AS NEEDED FOR WHEEZE OR SHORTNESS OF BREATH 8.5 each 1   atorvastatin  (LIPITOR) 10 MG tablet Take 1 tablet (10 mg total) by mouth daily. 90 tablet 1   blood glucose meter kit and supplies Dispense based on patient and insurance preference. Use up to four times daily as directed. (FOR ICD-10 E10.9, E11.9). 1 each 0   Blood Glucose Monitoring Suppl DEVI 1 each by Does not apply route in the morning, at noon, and at bedtime. May substitute to any manufacturer covered by patient's insurance. 1 each 0   doxycycline  (VIBRAMYCIN ) 100 MG capsule TAKE 1 CAPSULE BY MOUTH EVERY 12 HOURS 56 capsule 0   finasteride  (PROSCAR ) 5 MG tablet Take 1 tablet (5 mg total) by mouth daily. 90 tablet 3   fluticasone  (FLONASE ) 50 MCG/ACT nasal spray Place 2 sprays into both nostrils daily. 16 g 6   Fluticasone -Umeclidin-Vilant (TRELEGY ELLIPTA ) 200-62.5-25 MCG/ACT AEPB Inhale 1 puff into the lungs daily. 28 each 5   glucose blood test strip Use as instructed up to once per day. Dx.E11.65 100 each 12   losartan -hydrochlorothiazide  (HYZAAR) 50-12.5 MG tablet Take 1 tablet by mouth daily. 90 tablet 1   montelukast  (SINGULAIR ) 10 MG tablet Take 1 tablet (10 mg total) by mouth at bedtime. 30 tablet 3   tadalafil  (CIALIS ) 20 MG tablet Take 1 tablet (20 mg total) by mouth daily as needed. 30 tablet 5   tamsulosin  (FLOMAX ) 0.4 MG CAPS capsule Take 2 capsules (0.8 mg total) by mouth at bedtime. 60 capsule 11   triamcinolone  cream (KENALOG ) 0.1 % Apply 1 Application topically 2 (two) times daily. 30 g  0   No facility-administered medications prior to visit.   Review of Systems  Constitutional:  Negative for chills, fever, malaise/fatigue and weight loss.  HENT:  Negative for congestion, sinus pain and sore throat.   Eyes: Negative.   Respiratory:  Positive for cough, sputum production, shortness of breath and wheezing. Negative for hemoptysis.   Cardiovascular:  Negative for chest pain, palpitations, orthopnea, claudication and leg swelling.  Gastrointestinal:  Negative for abdominal pain, heartburn, nausea and vomiting.  Genitourinary: Negative.   Musculoskeletal:  Negative for joint pain and myalgias.  Skin:  Negative for rash.  Neurological:  Negative for weakness.  Endo/Heme/Allergies: Negative.   Psychiatric/Behavioral: Negative.      Objective:   Vitals:   12/13/23 0838  BP: (!) 128/56  Weight: 172 lb (78 kg)  Height: 5\' 9"  (1.753 m)     Physical Exam Constitutional:      General: He is not in acute distress. HENT:     Head: Normocephalic and atraumatic.  Cardiovascular:     Rate and Rhythm: Normal rate and regular rhythm.     Pulses: Normal pulses.     Heart sounds: Normal heart sounds. No murmur heard. Pulmonary:     Effort: Pulmonary effort is normal.     Breath sounds: Decreased breath sounds present. No wheezing, rhonchi or rales.  Musculoskeletal:     Right lower leg: No edema.     Left lower leg: No edema.  Skin:    General: Skin is warm and dry.  Neurological:     Mental Status: He is alert.    CBC    Component Value Date/Time   WBC 8.3 12/13/2023 0929   RBC 4.75 12/13/2023 0929   HGB 14.3 12/13/2023 0929   HGB 14.2 08/01/2022 0836   HCT 43.1 12/13/2023 0929   PLT 257.0 12/13/2023  0929   PLT 282 08/01/2022 0836   MCV 90.7 12/13/2023 0929   MCV 93.7 12/02/2013 1003   MCH 30.3 08/01/2022 0836   MCHC 33.2 12/13/2023 0929   RDW 14.0 12/13/2023 0929   LYMPHSABS 2.7 12/13/2023 0929   MONOABS 0.7 12/13/2023 0929   EOSABS 1.7 (H) 12/13/2023  0929   BASOSABS 0.1 12/13/2023 0929      Latest Ref Rng & Units 10/25/2023   11:22 AM 06/28/2023    4:15 PM 12/20/2022    1:13 PM  BMP  Glucose 70 - 99 mg/dL 527  782  423   BUN 6 - 23 mg/dL 12  11  10    Creatinine 0.40 - 1.50 mg/dL 5.36  1.44  3.15   Sodium 135 - 145 mEq/L 138  135  134   Potassium 3.5 - 5.1 mEq/L 4.1  4.4  3.5   Chloride 96 - 112 mEq/L 100  97  99   CO2 19 - 32 mEq/L 30  30  24    Calcium  8.4 - 10.5 mg/dL 9.8  9.7  8.9    Chest imaging: CXR 09/17/21 1. Cardiomegaly. 2. Low lung volumes with mild atelectasis at the lung bases.  PFT:    Latest Ref Rng & Units 12/27/2021    9:39 AM  PFT Results  FVC-Pre L 2.47   FVC-Predicted Pre % 72   FVC-Post L 2.74   FVC-Predicted Post % 80   Pre FEV1/FVC % % 77   Post FEV1/FCV % % 76   FEV1-Pre L 1.90   FEV1-Predicted Pre % 76   FEV1-Post L 2.09   DLCO uncorrected ml/min/mmHg 18.59   DLCO UNC% % 80   DLCO corrected ml/min/mmHg 19.09   DLCO COR %Predicted % 82   DLVA Predicted % 96   TLC L 5.73   TLC % Predicted % 86   RV % Predicted % 122     Labs:  Path:  Echo:  Heart Catheterization:  Assessment & Plan:   Moderate persistent asthma without complication - Plan: CBC with Differential/Platelet, IgE, budesonide (PULMICORT) 0.5 MG/2ML nebulizer solution, IgE, CBC with Differential/Platelet, Ambulatory Referral for DME  Eosinophilia, unspecified type - Plan: CBC with Differential/Platelet, IgE, IgE, CBC with Differential/Platelet  Discussion: Phillip Maynard is a 83 year old male, former smoker with GERD, hypertension and DMII who returns to pulmonary clinic for asthma.  Moderate Persistent Asthma Peripheral Eosinophilia - repeat CBC with diff and IgE levels today - continue trelegy elltipa 200, 1 puff daily - start budesonide 0.5mg  nebulizer treatments twice daily. Avoiding systemic steroids due to history of hyperglycemia/DM - Continue montelukast  10mg  daily - Will consider starting him on dupixent  if symtpoms continue   Follow up in 3 months.  Duaine German, MD [facility] Office: [phone]   Current Outpatient Medications:    Accu-Chek Softclix Lancets lancets, Use as instructed, Disp: 100 each, Rfl: 6   albuterol  (VENTOLIN  HFA) 108 (90 Base) MCG/ACT inhaler, TAKE 2 PUFFS BY MOUTH EVERY 6 HOURS AS NEEDED FOR WHEEZE OR SHORTNESS OF BREATH, Disp: 8.5 each, Rfl: 1   atorvastatin  (LIPITOR) 10 MG tablet, Take 1 tablet (10 mg total) by mouth daily., Disp: 90 tablet, Rfl: 1   blood glucose meter kit and supplies, Dispense based on patient and insurance preference. Use up to four times daily as directed. (FOR ICD-10 E10.9, E11.9)., Disp: 1 each, Rfl: 0   Blood Glucose Monitoring Suppl DEVI, 1 each by Does not apply route  in the morning, at noon, and at bedtime. May substitute to any manufacturer covered by patient's insurance., Disp: 1 each, Rfl: 0   budesonide (PULMICORT) 0.5 MG/2ML nebulizer solution, Take 2 mLs (0.5 mg total) by nebulization 2 (two) times daily., Disp: 120 mL, Rfl: 11   doxycycline  (VIBRAMYCIN ) 100 MG capsule, TAKE 1 CAPSULE BY MOUTH EVERY 12 HOURS, Disp: 56 capsule, Rfl: 0   finasteride  (PROSCAR ) 5 MG tablet, Take 1 tablet (5 mg total) by mouth daily., Disp: 90 tablet, Rfl: 3   fluticasone  (FLONASE ) 50 MCG/ACT nasal spray, Place 2 sprays into both nostrils daily., Disp: 16 g, Rfl: 6   Fluticasone -Umeclidin-Vilant (TRELEGY ELLIPTA ) 200-62.5-25 MCG/ACT AEPB, Inhale 1 puff into the lungs daily., Disp: 28 each, Rfl: 5   glucose blood test strip, Use as instructed up to once per day. Dx.E11.65, Disp: 100 each, Rfl: 12   losartan -hydrochlorothiazide  (HYZAAR) 50-12.5 MG tablet, Take 1 tablet by mouth daily., Disp: 90 tablet, Rfl: 1   montelukast  (SINGULAIR ) 10 MG tablet, Take 1 tablet (10 mg total) by mouth at bedtime., Disp: 30 tablet, Rfl: 3   tadalafil  (CIALIS ) 20 MG tablet, Take 1 tablet (20 mg total) by mouth daily as needed., Disp: 30 tablet, Rfl:  5   tamsulosin  (FLOMAX ) 0.4 MG CAPS capsule, Take 2 capsules (0.8 mg total) by mouth at bedtime., Disp: 60 capsule, Rfl: 11   triamcinolone  cream (KENALOG ) 0.1 %, Apply 1 Application topically 2 (two) times daily., Disp: 30 g, Rfl: 0

## 2023-12-14 ENCOUNTER — Telehealth: Payer: Self-pay

## 2023-12-14 ENCOUNTER — Telehealth: Payer: Self-pay | Admitting: Pulmonary Disease

## 2023-12-14 LAB — IGE: IgE (Immunoglobulin E), Serum: 328 kU/L — ABNORMAL HIGH (ref <=114)

## 2023-12-14 NOTE — Telephone Encounter (Signed)
 Called to relay message INS won't cover new Neb machine

## 2023-12-14 NOTE — Telephone Encounter (Signed)
 Per Devra Fontana at [facility]  This pt received a neb 04/11/2022.His insurance will not cover another 3-5 years  Please advise

## 2023-12-14 NOTE — Telephone Encounter (Signed)
 Called patient twice phone would hang up. Insurance will not cover new neb machine

## 2023-12-15 NOTE — Telephone Encounter (Signed)
 Called several times unable to get arespounce .Phillip Maynard He will have to pay out of pocket for a new one

## 2023-12-15 NOTE — Telephone Encounter (Signed)
 Not much we can do unless he goes buys his own

## 2024-01-01 ENCOUNTER — Other Ambulatory Visit: Payer: Self-pay | Admitting: Family Medicine

## 2024-01-01 DIAGNOSIS — I10 Essential (primary) hypertension: Secondary | ICD-10-CM

## 2024-01-11 DIAGNOSIS — J4540 Moderate persistent asthma, uncomplicated: Secondary | ICD-10-CM | POA: Diagnosis not present

## 2024-01-11 DIAGNOSIS — D721 Eosinophilia, unspecified: Secondary | ICD-10-CM | POA: Diagnosis not present

## 2024-01-12 ENCOUNTER — Other Ambulatory Visit: Payer: Self-pay | Admitting: Urology

## 2024-01-30 ENCOUNTER — Other Ambulatory Visit: Payer: Self-pay | Admitting: Family Medicine

## 2024-01-30 DIAGNOSIS — E785 Hyperlipidemia, unspecified: Secondary | ICD-10-CM

## 2024-01-30 DIAGNOSIS — R21 Rash and other nonspecific skin eruption: Secondary | ICD-10-CM

## 2024-02-07 ENCOUNTER — Ambulatory Visit: Admitting: Urology

## 2024-02-07 ENCOUNTER — Encounter: Payer: Self-pay | Admitting: Urology

## 2024-02-07 VITALS — BP 149/71 | HR 53

## 2024-02-07 DIAGNOSIS — Z8551 Personal history of malignant neoplasm of bladder: Secondary | ICD-10-CM | POA: Diagnosis not present

## 2024-02-07 DIAGNOSIS — C679 Malignant neoplasm of bladder, unspecified: Secondary | ICD-10-CM | POA: Diagnosis not present

## 2024-02-07 LAB — URINALYSIS, ROUTINE W REFLEX MICROSCOPIC
Bilirubin, UA: NEGATIVE
Glucose, UA: NEGATIVE
Ketones, UA: NEGATIVE
Leukocytes,UA: NEGATIVE
Nitrite, UA: NEGATIVE
Protein,UA: NEGATIVE
RBC, UA: NEGATIVE
Specific Gravity, UA: 1.015 (ref 1.005–1.030)
Urobilinogen, Ur: 0.2 mg/dL (ref 0.2–1.0)
pH, UA: 6 (ref 5.0–7.5)

## 2024-02-07 MED ORDER — CIPROFLOXACIN HCL 500 MG PO TABS
500.0000 mg | ORAL_TABLET | Freq: Once | ORAL | Status: AC
  Administered 2024-02-07: 500 mg via ORAL

## 2024-02-07 NOTE — Progress Notes (Signed)
   02/07/24  CC: followup bladder cancer   HPI: [name] is a 83yo here for followup for bladder cancer Blood pressure (!) 149/71, pulse (!) 53. NED. A&Ox3.   No respiratory distress   Abd soft, NT, ND Normal phallus with bilateral descended testicles  Cystoscopy Procedure Note  Patient identification was confirmed, informed consent was obtained, and patient was prepped using Betadine solution.  Lidocaine  jelly was administered per urethral meatus.     Pre-Procedure: - Inspection reveals a normal caliber ureteral meatus.  Procedure: The flexible cystoscope was introduced without difficulty - No urethral strictures/lesions are present. - Enlarged prostate  - Normal bladder neck - Bilateral ureteral orifices identified - Bladder mucosa  reveals no ulcers, tumors, or lesions - No bladder stones - No trabeculation     Post-Procedure: - Patient tolerated the procedure well  Assessment/ Plan: Followup 3 months for cystoscopy  No follow-ups on file.  Phillip Clara, MD

## 2024-02-07 NOTE — Patient Instructions (Signed)

## 2024-02-09 ENCOUNTER — Ambulatory Visit (INDEPENDENT_AMBULATORY_CARE_PROVIDER_SITE_OTHER): Admitting: Family Medicine

## 2024-02-09 ENCOUNTER — Encounter: Payer: Self-pay | Admitting: Family Medicine

## 2024-02-09 VITALS — BP 136/72 | HR 66 | Temp 98.1°F | Resp 15 | Ht 69.0 in | Wt 170.0 lb

## 2024-02-09 DIAGNOSIS — I10 Essential (primary) hypertension: Secondary | ICD-10-CM | POA: Diagnosis not present

## 2024-02-09 DIAGNOSIS — E785 Hyperlipidemia, unspecified: Secondary | ICD-10-CM

## 2024-02-09 DIAGNOSIS — J4540 Moderate persistent asthma, uncomplicated: Secondary | ICD-10-CM | POA: Diagnosis not present

## 2024-02-09 DIAGNOSIS — R062 Wheezing: Secondary | ICD-10-CM

## 2024-02-09 DIAGNOSIS — R21 Rash and other nonspecific skin eruption: Secondary | ICD-10-CM | POA: Diagnosis not present

## 2024-02-09 DIAGNOSIS — J449 Chronic obstructive pulmonary disease, unspecified: Secondary | ICD-10-CM | POA: Diagnosis not present

## 2024-02-09 DIAGNOSIS — R059 Cough, unspecified: Secondary | ICD-10-CM | POA: Diagnosis not present

## 2024-02-09 DIAGNOSIS — E1165 Type 2 diabetes mellitus with hyperglycemia: Secondary | ICD-10-CM

## 2024-02-09 DIAGNOSIS — E669 Obesity, unspecified: Secondary | ICD-10-CM | POA: Insufficient documentation

## 2024-02-09 LAB — COMPREHENSIVE METABOLIC PANEL WITH GFR
ALT: 14 U/L (ref 0–53)
AST: 19 U/L (ref 0–37)
Albumin: 4.1 g/dL (ref 3.5–5.2)
Alkaline Phosphatase: 47 U/L (ref 39–117)
BUN: 12 mg/dL (ref 6–23)
CO2: 32 meq/L (ref 19–32)
Calcium: 9.4 mg/dL (ref 8.4–10.5)
Chloride: 100 meq/L (ref 96–112)
Creatinine, Ser: 1.11 mg/dL (ref 0.40–1.50)
GFR: 62.49 mL/min (ref >60.00)
Glucose, Bld: 81 mg/dL (ref 70–99)
Potassium: 3.3 meq/L — ABNORMAL LOW (ref 3.5–5.1)
Sodium: 138 meq/L (ref 135–145)
Total Bilirubin: 0.7 mg/dL (ref 0.2–1.2)
Total Protein: 7.4 g/dL (ref 6.0–8.3)

## 2024-02-09 LAB — LIPID PANEL
Cholesterol: 119 mg/dL (ref 0–200)
HDL: 33.5 mg/dL — ABNORMAL LOW (ref >39.00)
LDL Cholesterol: 67 mg/dL (ref 0–99)
NonHDL: 85.9
Total CHOL/HDL Ratio: 4
Triglycerides: 96 mg/dL (ref 0.0–149.0)
VLDL: 19.2 mg/dL (ref 0.0–40.0)

## 2024-02-09 LAB — HEMOGLOBIN A1C: Hgb A1c MFr Bld: 7.3 % — ABNORMAL HIGH (ref 4.6–6.5)

## 2024-02-09 MED ORDER — MONTELUKAST SODIUM 10 MG PO TABS
10.0000 mg | ORAL_TABLET | Freq: Every day | ORAL | 3 refills | Status: DC
Start: 2024-02-09 — End: 2024-06-06

## 2024-02-09 MED ORDER — ALBUTEROL SULFATE HFA 108 (90 BASE) MCG/ACT IN AERS: INHALATION_SPRAY | RESPIRATORY_TRACT | 1 refills | Status: AC

## 2024-02-09 MED ORDER — TRIAMCINOLONE ACETONIDE 0.1 % EX CREA: TOPICAL_CREAM | Freq: Two times a day (BID) | CUTANEOUS | 1 refills | Status: AC | PRN

## 2024-02-09 MED ORDER — FLUTICASONE PROPIONATE 50 MCG/ACT NA SUSP: 2.0000 | Freq: Every day | NASAL | 6 refills | Status: AC

## 2024-02-09 MED ORDER — TRELEGY ELLIPTA 200-62.5-25 MCG/ACT IN AEPB: 1.0000 | INHALATION_SPRAY | Freq: Every day | RESPIRATORY_TRACT | 5 refills | Status: DC

## 2024-02-09 NOTE — Progress Notes (Unsigned)
 Subjective:  Patient ID: Phillip LELON Loring Mickey., male    DOB: 01-20-1941  Age: 83 y.o. MRN: [id number]  CC:  Chief Complaint  Patient presents with   Medical Management of Chronic Issues    Pt notes doing well, no concerns.     HPI Phillip Oestreich [employer]. presents for   Diabetes: With history of hyperglycemia. Diet controlled, previously took metformin , improved A1c November last year, slight uptick in March.  Goal of under 7.5 given age. He is on statin with Lipitor 10 mg daily, ARB with losartan  HCTZ 50/12.5 mg daily. No side effects.  Home readings: Fasting: 120-130 Postprandial 140-150. No 200's or lows.  No current meds.  5 cups coffee per day.  Eating sweets at times - few days per week.  Microalbumin: Normal ratio 10.7 in November 2024. Optho, foot exam, pneumovax:  Optho - last appt about a year ago, advised to schedule  Lab Results  Component Value Date   HGBA1C 7.3 (H) 10/25/2023   HGBA1C 6.5 06/28/2023   HGBA1C 7.5 (H) 12/16/2021   Lab Results  Component Value Date   MICROALBUR 13.4 (H) 06/28/2023   LDLCALC 63 06/28/2023   CREATININE 1.08 10/25/2023   Rash of lower leg Left leg rash discussed previously in March.  Have been present for few months with intermittent itching.  Improving at follow-up visit with use of hydrating lotion and triamcinolone  topical for possible eczematous rash. Still improving, still some discoloration, using vaseline. Not using steroid cream - not itching.   Cough/COPD/asthma Improved with higher dose of Trelegy at his March visit.  Lungs were clear at that time, planned on continued pulmonary follow-up.  Still doing well on trelegy.  Albuterol  1-2 times per week. He was seen 4/30 by pulmonary -  recommending budesonide  0.5mg  BID. Not taking - ? Insurance issue.   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 No longer on medicine   Past Surgical History:  Procedure Laterality Date   COLONOSCOPY  last 09/06/2016   CYSTOSCOPY N/A 09/08/2016   Procedure: FIREFLY INJECTIONS;  Surgeon: Belvie LITTIE Clara, MD;  Location: [facility];  Service: Urology;  Laterality: N/A;   CYSTOSCOPY W/ RETROGRADES Bilateral 11/18/2021   Procedure: CYSTOSCOPY WITH RETROGRADE PYELOGRAM;  Surgeon:  McKenzie, Belvie CROME, MD;  Location: [facility];  Service: Urology;  Laterality: Bilateral;   CYSTOSCOPY WITH RETROGRADE PYELOGRAM, URETEROSCOPY AND STENT PLACEMENT Bilateral 12/22/2022   Procedure: CYSTOSCOPY WITH RETROGRADE PYELOGRAM;  Surgeon: Sherrilee Belvie CROME, MD;  Location: [facility];  Service: Urology;  Laterality: Bilateral;   INCISION AND DRAINAGE OF WOUND Right 1970's   leg (04/08/2013)   LIVER BIOPSY  ~ 2011   POLYPECTOMY     PROCTOSCOPY N/A 09/08/2016   Procedure: RIGID PROCTOSCOPY;  Surgeon: Elspeth Schultze, MD;  Location: [facility];  Service: General;  Laterality: N/A;   TONSILLECTOMY     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   Procedure: TRANSURETHRAL RESECTION OF BLADDER TUMOR (TURBT);  Surgeon: Sherrilee Belvie CROME, MD;  Location: [facility];  Service: Urology;  Laterality: N/A;   TRANSURETHRAL RESECTION OF BLADDER TUMOR N/A 12/22/2022   Procedure: TRANSURETHRAL RESECTION OF BLADDER TUMOR (TURBT);  Surgeon: Sherrilee Belvie CROME, MD;  Location: [facility];  Service: Urology;  Laterality: N/A;   URETERAL BIOPSY Bilateral 12/22/2022   Procedure: URETERAL BIOPSY-selective cytologies;  Surgeon: Sherrilee Belvie CROME, MD;  Location: [facility];  Service: Urology;  Laterality: Bilateral;   Allergies  Allergen Reactions   Penicillins Anaphylaxis and Other (See Comments)    Has patient had a PCN reaction causing immediate rash, facial/tongue/throat swelling, SOB or lightheadedness with hypotension: Yes Has patient had a PCN reaction causing severe rash involving mucus membranes or skin necrosis: No Has patient had a PCN reaction that required hospitalization No Has patient had a PCN reaction occurring within the last 10 years: No If all of the above answers are NO, then may proceed with Cephalosporin use.   Shellfish Allergy Anaphylaxis    Shrimp  crabs   Prior to Admission medications   Medication Sig Start Date End Date Taking? Authorizing Provider  Accu-Chek Softclix Lancets lancets Use as instructed 03/21/22  Yes Levora Phillip SAUNDERS, MD  albuterol  (VENTOLIN  HFA) 108 (90 Base) MCG/ACT inhaler TAKE 2 PUFFS BY MOUTH EVERY 6 HOURS AS NEEDED FOR WHEEZE OR SHORTNESS OF BREATH 05/25/23  Yes Levora Phillip SAUNDERS, MD  atorvastatin  (LIPITOR) 10 MG tablet TAKE 1 TABLET BY MOUTH EVERY DAY 01/30/24  Yes Levora Phillip SAUNDERS, MD  Blood Glucose Monitoring Suppl DEVI 1 each by Does not apply route in the morning, at noon, and at bedtime. May substitute to any manufacturer covered by patient's insurance. 07/19/23  Yes  Levora Phillip SAUNDERS, MD  doxycycline  (VIBRAMYCIN ) 100 MG capsule TAKE 1 CAPSULE BY MOUTH EVERY 12 HOURS 01/16/24  Yes McKenzie, Belvie CROME, MD  finasteride  (PROSCAR ) 5 MG tablet Take 1 tablet (5 mg total) by mouth daily. 08/02/23  Yes McKenzie, Belvie CROME, MD  fluticasone  (FLONASE ) 50 MCG/ACT nasal spray Place 2 sprays into both nostrils daily. 10/25/23  Yes Levora Phillip SAUNDERS, MD  Fluticasone -Umeclidin-Vilant (TRELEGY ELLIPTA ) 200-62.5-25 MCG/ACT AEPB Inhale 1 puff into the lungs daily. 10/25/23  Yes Levora Phillip SAUNDERS, MD  losartan -hydrochlorothiazide  (HYZAAR) 50-12.5 MG tablet TAKE 1 TABLET BY MOUTH EVERY DAY 01/02/24  Yes Levora Phillip SAUNDERS, MD  montelukast  (SINGULAIR ) 10 MG tablet Take 1 tablet (10 mg total) by mouth at bedtime. 10/25/23  Yes Levora Phillip SAUNDERS, MD  tadalafil  (CIALIS ) 20 MG tablet Take 1 tablet (20 mg total) by mouth daily as needed. 08/02/23  Yes McKenzie, Belvie CROME, MD  tamsulosin  (FLOMAX ) 0.4 MG CAPS capsule Take 2 capsules (0.8 mg total) by mouth at bedtime. 08/02/23  Yes McKenzie, Belvie CROME, MD  triamcinolone  cream (  KENALOG ) 0.1 % APPLY TO AFFECTED AREA TWICE A DAY 01/30/24  Yes Levora Phillip SAUNDERS, MD  blood glucose meter kit and supplies Dispense based on patient and insurance preference. Use up to four times daily as directed. (FOR ICD-10 E10.9, E11.9). Patient not taking: Reported on 02/09/2024 09/29/21   Levora Phillip SAUNDERS, MD  budesonide  (PULMICORT ) 0.5 MG/2ML nebulizer solution Take 2 mLs (0.5 mg total) by nebulization 2 (two) times daily. Patient not taking: Reported on 02/09/2024 12/13/23   Kara Carrier B, MD  glucose blood test strip Use as instructed up to once per day. Dx.E11.65 Patient not taking: Reported on 02/09/2024 07/21/22   Levora Phillip SAUNDERS, MD   Social History   Socioeconomic History   Marital status: Married    Spouse name: Not on file   Number of children: 2   Years of education: Not on file   Highest education level: Not on file  Occupational History    Occupation: truck driver    Employer: [employer]    Comment: retired  Tobacco Use   Smoking status: Former    Current packs/day: 0.00    Average packs/day: 0.3 packs/day for 45.0 years (11.3 ttl pk-yrs)    Types: Cigarettes    Start date: 03/05/1976    Quit date: 03/05/2021    Years since quitting: 2.9   Smokeless tobacco: Never  Vaping Use   Vaping status: Never Used  Substance and Sexual Activity   Alcohol use: Yes    Alcohol/week: 12.0 standard drinks of alcohol    Types: 12 Cans of beer per week    Comment: 12 pack a week.   Drug use: No   Sexual activity: Yes  Other Topics Concern   Not on file  Social History Narrative   Pt lives in 1 story home with his wife   Has 2 adult children   Highest level of education: GED & Trade school   Retired Engineer, maintenance.    Social Drivers of Corporate investment banker Strain: Low Risk  (08/01/2023)   Overall Financial Resource Strain (CARDIA)    Difficulty of Paying Living Expenses: Not hard at all  Food Insecurity: No Food Insecurity (08/01/2023)   Hunger Vital Sign    Worried About Running Out of Food in the Last Year: Never true    Ran Out of Food in the Last Year: Never true  Transportation Needs: No Transportation Needs (08/01/2023)   PRAPARE - Administrator, Civil Service (Medical): No    Lack of Transportation (Non-Medical): No  Physical Activity: Inactive (08/01/2023)   Exercise Vital Sign    Days of Exercise per Week: 0 days    Minutes of Exercise per Session: 0 min  Stress: No Stress Concern Present (08/01/2023)   [employer] of Occupational Health - Occupational Stress Questionnaire    Feeling of Stress : Not at all  Social Connections: Moderately Isolated (08/01/2023)   Social Connection and Isolation Panel    Frequency of Communication with Friends and Family: Twice a week    Frequency of Social Gatherings with Friends and Family: Twice a week    Attends Religious Services: Never     Database administrator or Organizations: No    Attends Banker Meetings: Never    Marital Status: Married  Catering manager Violence: Not At Risk (08/01/2023)   Humiliation, Afraid, Rape, and Kick questionnaire    Fear of Current or Ex-Partner: No    Emotionally  Abused: No    Physically Abused: No    Sexually Abused: No    Review of Systems  Constitutional:  Negative for fatigue and unexpected weight change.  Eyes:  Negative for visual disturbance.  Respiratory:  Negative for cough, chest tightness and shortness of breath.   Cardiovascular:  Negative for chest pain, palpitations and leg swelling.  Gastrointestinal:  Negative for abdominal pain and blood in stool.  Neurological:  Negative for dizziness, light-headedness and headaches.     Objective:   Vitals:   02/09/24 0947  BP: 136/72  Pulse: 66  Resp: 15  Temp: 98.1 F (36.7 C)  TempSrc: Temporal  SpO2: 96%  Weight: 170 lb (77.1 kg)  Height: 5' 9 (1.753 m)     Physical Exam Vitals reviewed.  Constitutional:      Appearance: He is well-developed.  HENT:     Head: Normocephalic and atraumatic.  Neck:     Vascular: No carotid bruit or JVD.   Cardiovascular:     Rate and Rhythm: Normal rate and regular rhythm.     Heart sounds: Normal heart sounds. No murmur heard. Pulmonary:     Effort: Pulmonary effort is normal.     Breath sounds: Normal breath sounds. No rales.   Musculoskeletal:     Right lower leg: No edema.     Left lower leg: No edema.   Skin:    General: Skin is warm and dry.     Comments: Hyperpigmented patch of left lower leg, see photo, minimal changes from previous photo.  No induration, no active discharge or warmth.   Neurological:     Mental Status: He is alert and oriented to person, place, and time.   Psychiatric:        Mood and Affect: Mood normal.         Assessment & Plan:  Phillip Alamo. is a 83 y.o. male . Type 2 diabetes mellitus with hyperglycemia,  without long-term current use of insulin (HCC) - Plan: Comprehensive metabolic panel with GFR, Hemoglobin A1c  Wheezing - Plan: albuterol  (VENTOLIN  HFA) 108 (90 Base) MCG/ACT inhaler  Moderate persistent asthma, unspecified whether complicated - Plan: fluticasone  (FLONASE ) 50 MCG/ACT nasal spray, montelukast  (SINGULAIR ) 10 MG tablet  Chronic obstructive pulmonary disease, unspecified COPD type (HCC) - Plan: Fluticasone -Umeclidin-Vilant (TRELEGY ELLIPTA ) 200-62.5-25 MCG/ACT AEPB  Rash and nonspecific skin eruption - Plan: Ambulatory referral to Dermatology, triamcinolone  cream (KENALOG ) 0.1 %  Cough, unspecified type - Plan: fluticasone  (FLONASE ) 50 MCG/ACT nasal spray, montelukast  (SINGULAIR ) 10 MG tablet  Hyperlipidemia, unspecified hyperlipidemia type - Plan: Lipid panel  Essential hypertension   Meds ordered this encounter  Medications   albuterol  (VENTOLIN  HFA) 108 (90 Base) MCG/ACT inhaler    Sig: TAKE 2 PUFFS BY MOUTH EVERY 6 HOURS AS NEEDED FOR WHEEZE OR SHORTNESS OF BREATH    Dispense:  8.5 each    Refill:  1   triamcinolone  cream (KENALOG ) 0.1 %    Sig: Apply topically 2 (two) times daily as needed. APPLY TO AFFECTED AREA TWICE A DAY    Dispense:  30 g    Refill:  1   fluticasone  (FLONASE ) 50 MCG/ACT nasal spray    Sig: Place 2 sprays into both nostrils daily.    Dispense:  16 g    Refill:  6   Fluticasone -Umeclidin-Vilant (TRELEGY ELLIPTA ) 200-62.5-25 MCG/ACT AEPB    Sig: Inhale 1 puff into the lungs daily.    Dispense:  28 each    Refill:  5   montelukast  (SINGULAIR ) 10 MG tablet    Sig: Take 1 tablet (10 mg total) by mouth at bedtime.    Dispense:  30 tablet    Refill:  3   Patient Instructions  Freestyle or Dexcom meter would be an option, but check with your insurance to see if they will cover continuous glucose monitor. Let me know if they do and I can order a new meter if needed.  You can blood sugar either fasting or 2 hours after meals every day or two. You  do not need to check it every day.  Try to minimize sweets.  Try to cut back on coffee - cut back by 1/2 next few weeks - slowly decrease amounts to lessen possibility of caffeine withdrawal headache.  Call eye doctor for appointment for diabetes eye exam and make sure they send me a note.  I will refill albuterol  for now, but it appears your lung doctor wanted you to start budesonide  nebs twice per day. If there is an issue with coverage of a medicine - please contact your lung specialist to discuss alternatives or plan.  Thank you for coming in today. No other change in medications at this time. If there are any concerns on your bloodwork, I will let you know. Take care!     Signed,   Phillip Pines, MD [facility], [facility] 02/09/24 10:21 AM

## 2024-02-09 NOTE — Patient Instructions (Addendum)
 Freestyle or Dexcom meter would be an option, but check with your insurance to see if they will cover continuous glucose monitor. Let me know if they do and I can order a new meter if needed.  You can blood sugar either fasting or 2 hours after meals every day or two. You do not need to check it every day.  Try to minimize sweets.  Try to cut back on coffee - cut back by 1/2 next few weeks - slowly decrease amounts to lessen possibility of caffeine withdrawal headache.  Call eye doctor for appointment for diabetes eye exam and make sure they send me a note.  I will refill albuterol  for now, but it appears your lung doctor wanted you to start budesonide  nebs twice per day. If there is an issue with coverage of a medicine - please contact your lung specialist to discuss alternatives or plan.  Thank you for coming in today. No other change in medications at this time. If there are any concerns on your bloodwork, I will let you know. Take care!

## 2024-02-10 ENCOUNTER — Encounter: Payer: Self-pay | Admitting: Family Medicine

## 2024-02-11 ENCOUNTER — Ambulatory Visit: Payer: Self-pay | Admitting: Family Medicine

## 2024-02-11 DIAGNOSIS — J4540 Moderate persistent asthma, uncomplicated: Secondary | ICD-10-CM | POA: Diagnosis not present

## 2024-02-11 DIAGNOSIS — E876 Hypokalemia: Secondary | ICD-10-CM

## 2024-02-11 DIAGNOSIS — D721 Eosinophilia, unspecified: Secondary | ICD-10-CM | POA: Diagnosis not present

## 2024-02-12 ENCOUNTER — Other Ambulatory Visit (INDEPENDENT_AMBULATORY_CARE_PROVIDER_SITE_OTHER)

## 2024-02-12 DIAGNOSIS — E876 Hypokalemia: Secondary | ICD-10-CM

## 2024-02-12 LAB — BASIC METABOLIC PANEL WITH GFR
BUN: 9 mg/dL (ref 6–23)
CO2: 30 meq/L (ref 19–32)
Calcium: 9.2 mg/dL (ref 8.4–10.5)
Chloride: 100 meq/L (ref 96–112)
Creatinine, Ser: 1.15 mg/dL (ref 0.40–1.50)
GFR: 59.89 mL/min — ABNORMAL LOW (ref >60.00)
Glucose, Bld: 95 mg/dL (ref 70–99)
Potassium: 3.7 meq/L (ref 3.5–5.1)
Sodium: 139 meq/L (ref 135–145)

## 2024-02-13 ENCOUNTER — Ambulatory Visit: Payer: Self-pay | Admitting: Family Medicine

## 2024-03-12 DIAGNOSIS — J4540 Moderate persistent asthma, uncomplicated: Secondary | ICD-10-CM | POA: Diagnosis not present

## 2024-03-12 DIAGNOSIS — D721 Eosinophilia, unspecified: Secondary | ICD-10-CM | POA: Diagnosis not present

## 2024-03-13 ENCOUNTER — Ambulatory Visit (INDEPENDENT_AMBULATORY_CARE_PROVIDER_SITE_OTHER): Admitting: Pulmonary Disease

## 2024-03-13 ENCOUNTER — Encounter: Payer: Self-pay | Admitting: Pulmonary Disease

## 2024-03-13 VITALS — BP 134/58 | HR 88 | Ht 69.0 in | Wt 170.0 lb

## 2024-03-13 DIAGNOSIS — J4540 Moderate persistent asthma, uncomplicated: Secondary | ICD-10-CM | POA: Diagnosis not present

## 2024-03-13 DIAGNOSIS — J449 Chronic obstructive pulmonary disease, unspecified: Secondary | ICD-10-CM

## 2024-03-13 MED ORDER — TRELEGY ELLIPTA 200-62.5-25 MCG/ACT IN AEPB: 1.0000 | INHALATION_SPRAY | Freq: Every day | RESPIRATORY_TRACT | 11 refills | Status: AC

## 2024-03-13 NOTE — Progress Notes (Unsigned)
 Synopsis: Referred in March 2023 for COPD by Reyes Pines, MD  Subjective:   PATIENT ID: Phillip Maynard. GENDER: male DOB: [birth year], MRN: [id number]  HPI  Chief Complaint  Patient presents with   Medical Management of Chronic Issues   Phillip Maynard is a 83 year old male, former smoker with GERD, hypertension and DMII who returns to pulmonary clinic for asthma.  He experiences occasional episodes of chest heaviness in the evenings, relieved by albuterol . These episodes are infrequent and typically resolve with one dose of albuterol .  He uses Trelegy, one puff daily, and has albuterol  available as needed.   He maintains an active lifestyle, including mowing his lawn, without significant fatigue or dyspnea during these activities.  OV 12/13/23 He reports cough over recent months. Trelegy inhaler increased to 200mcg dosing with some improvement in cough. He has been doing yard work and mowing grass which can lead to wheezing and increase cough/mucous production.   He remains on montelukast  daily.    OV 03/2022 He was started on trelelgy ellipta at last visit with improvement of his symptoms. He continues to have cough, mucous production and intermittent wheezing though.  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 Evaluation is not concerning for other etiologies of elevated eosinophils at this time other than what is likely related to his asthma.   OV 12/27/21 He is using flovent  110mcg 2 puffs twice daily and did notice improvement in his breathing with the addition of stiolto inhaler after last visit. He is using albuterol  a couple of times per week.   PFTs are within normal limits today.   OV 10/26/21 Patient is having exertional dyspnea when walking to his mail box that has a slight incline in the driveway. Over recent weeks he is needing to stop to rest due to the shortness of breath. He does experience intermittent wheezing, mainly dry cough  but will have some clear mucous production every once in a while. He denies night time awakenings due to shortness of breath, cough or wheezing. He has gained about 12lbs over the last year.   He has been using flovent  110mcg 2 puffs twice daily and spiriva  have helped improve his breathing. He reports taking prednisone  3 weeks ago which helped his breathing and cough.   He quit smoking last year. He was smoking 0.5 to 1 pack per day for 30 years. 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 He wore protective gear for the most part. He lives with his wife. His mother had asthma and his brother and sisters have asthma.   He is having dental surgery in the near future. No issues with anesthesia in the past.   Past Medical History:  Diagnosis Date   Asthma    when I was a boy (04/08/2013)   Bronchitis    Colon polyps    Colovesical fistula    COPD (chronic obstructive pulmonary disease) (HCC)    Diverticulosis    GERD (gastroesophageal reflux disease)    Hepatitis C    treated with injections   High cholesterol    Hypertension    Type II diabetes mellitus (HCC)    hx of . No longer on medicine     Family History  Problem Relation Age of Onset   Asthma Mother    Heart attack Mother    Colon polyps Neg Hx    Colon cancer Neg Hx  Esophageal cancer Neg Hx    Rectal cancer Neg Hx    Stomach cancer Neg Hx      Social History   Socioeconomic History   Marital status: Married    Spouse name: Not on file   Number of children: 2   Years of education: Not on file   Highest education level: Not on file  Occupational History   Occupation: truck Air traffic controller: [employer]    Comment: retired  Tobacco Use   Smoking status: Former    Current packs/day: 0.00    Average packs/day: 0.3 packs/day for 45.0 years (11.3 ttl pk-yrs)    Types: Cigarettes    Start date: 03/05/1976    Quit date: 03/05/2021    Years since quitting: 3.0    Smokeless tobacco: Never  Vaping Use   Vaping status: Never Used  Substance and Sexual Activity   Alcohol use: Yes    Alcohol/week: 12.0 standard drinks of alcohol    Types: 12 Cans of beer per week    Comment: 12 pack a week.   Drug use: No   Sexual activity: Yes  Other Topics Concern   Not on file  Social History Narrative   Pt lives in 1 story home with his wife   Has 2 adult children   Highest level of education: GED & Trade school   Retired Engineer, maintenance.    Social Drivers of Corporate investment banker Strain: Low Risk  (08/01/2023)   Overall Financial Resource Strain (CARDIA)    Difficulty of Paying Living Expenses: Not hard at all  Food Insecurity: No Food Insecurity (08/01/2023)   Hunger Vital Sign    Worried About Running Out of Food in the Last Year: Never true    Ran Out of Food in the Last Year: Never true  Transportation Needs: No Transportation Needs (08/01/2023)   PRAPARE - Administrator, Civil Service (Medical): No    Lack of Transportation (Non-Medical): No  Physical Activity: Inactive (08/01/2023)   Exercise Vital Sign    Days of Exercise per Week: 0 days    Minutes of Exercise per Session: 0 min  Stress: No Stress Concern Present (08/01/2023)   [employer] of Occupational Health - Occupational Stress Questionnaire    Feeling of Stress : Not at all  Social Connections: Moderately Isolated (08/01/2023)   Social Connection and Isolation Panel    Frequency of Communication with Friends and Family: Twice a week    Frequency of Social Gatherings with Friends and Family: Twice a week    Attends Religious Services: Never    Database administrator or Organizations: No    Attends Banker Meetings: Never    Marital Status: Married  Catering manager Violence: Not At Risk (08/01/2023)   Humiliation, Afraid, Rape, and Kick questionnaire    Fear of Current or Ex-Partner: No    Emotionally Abused: No    Physically Abused:  No    Sexually Abused: No     Allergies  Allergen Reactions   Penicillins Anaphylaxis and Other (See Comments)    Has patient had a PCN reaction causing immediate rash, facial/tongue/throat swelling, SOB or lightheadedness with hypotension: Yes Has patient had a PCN reaction causing severe rash involving mucus membranes or skin necrosis: No Has patient had a PCN reaction that required hospitalization No Has patient had a PCN reaction occurring within the last 10 years: No If all of the above  answers are NO, then may proceed with Cephalosporin use.   Shellfish Allergy Anaphylaxis    Shrimp  crabs     Outpatient Medications Prior to Visit  Medication Sig Dispense Refill   Accu-Chek Softclix Lancets lancets Use as instructed 100 each 6   albuterol  (VENTOLIN  HFA) 108 (90 Base) MCG/ACT inhaler TAKE 2 PUFFS BY MOUTH EVERY 6 HOURS AS NEEDED FOR WHEEZE OR SHORTNESS OF BREATH 8.5 each 1   atorvastatin  (LIPITOR) 10 MG tablet TAKE 1 TABLET BY MOUTH EVERY DAY 90 tablet 1   blood glucose meter kit and supplies Dispense based on patient and insurance preference. Use up to four times daily as directed. (FOR ICD-10 E10.9, E11.9). 1 each 0   Blood Glucose Monitoring Suppl DEVI 1 each by Does not apply route in the morning, at noon, and at bedtime. May substitute to any manufacturer covered by patient's insurance. 1 each 0   finasteride  (PROSCAR ) 5 MG tablet Take 1 tablet (5 mg total) by mouth daily. 90 tablet 3   fluticasone  (FLONASE ) 50 MCG/ACT nasal spray Place 2 sprays into both nostrils daily. 16 g 6   losartan -hydrochlorothiazide  (HYZAAR) 50-12.5 MG tablet TAKE 1 TABLET BY MOUTH EVERY DAY 90 tablet 1   montelukast  (SINGULAIR ) 10 MG tablet Take 1 tablet (10 mg total) by mouth at bedtime. 30 tablet 3   tadalafil  (CIALIS ) 20 MG tablet Take 1 tablet (20 mg total) by mouth daily as needed. 30 tablet 5   tamsulosin  (FLOMAX ) 0.4 MG CAPS capsule Take 2 capsules (0.8 mg total) by mouth at bedtime. 60  capsule 11   triamcinolone  cream (KENALOG ) 0.1 % Apply topically 2 (two) times daily as needed. APPLY TO AFFECTED AREA TWICE A DAY 30 g 1   budesonide  (PULMICORT ) 0.5 MG/2ML nebulizer solution Take 2 mLs (0.5 mg total) by nebulization 2 (two) times daily. 120 mL 11   doxycycline  (VIBRAMYCIN ) 100 MG capsule TAKE 1 CAPSULE BY MOUTH EVERY 12 HOURS 56 capsule 0   Fluticasone -Umeclidin-Vilant (TRELEGY ELLIPTA ) 200-62.5-25 MCG/ACT AEPB Inhale 1 puff into the lungs daily. 28 each 5   No facility-administered medications prior to visit.   Review of Systems  Constitutional:  Negative for chills, fever, malaise/fatigue and weight loss.  HENT:  Negative for congestion, sinus pain and sore throat.   Eyes: Negative.   Respiratory:  Negative for cough, hemoptysis, sputum production, shortness of breath and wheezing.   Cardiovascular:  Negative for chest pain, palpitations, orthopnea, claudication and leg swelling.  Gastrointestinal:  Negative for abdominal pain, heartburn, nausea and vomiting.  Genitourinary: Negative.   Musculoskeletal:  Negative for joint pain and myalgias.  Skin:  Negative for rash.  Neurological:  Negative for weakness.  Endo/Heme/Allergies: Negative.   Psychiatric/Behavioral: Negative.     Objective:   Vitals:   03/13/24 0945  BP: (!) 134/58  Pulse: 88  SpO2: 96%  Weight: 170 lb (77.1 kg)  Height: 5' 9 (1.753 m)   Physical Exam Constitutional:      General: He is not in acute distress. HENT:     Head: Normocephalic and atraumatic.  Cardiovascular:     Rate and Rhythm: Normal rate and regular rhythm.     Pulses: Normal pulses.     Heart sounds: Normal heart sounds. No murmur heard. Pulmonary:     Effort: Pulmonary effort is normal.     Breath sounds: Decreased breath sounds present. No wheezing, rhonchi or rales.  Musculoskeletal:     Right lower leg: No edema.  Left lower leg: No edema.  Skin:    General: Skin is warm and dry.  Neurological:     Mental  Status: He is alert.    CBC    Component Value Date/Time   WBC 8.3 12/13/2023 0929   RBC 4.75 12/13/2023 0929   HGB 14.3 12/13/2023 0929   HGB 14.2 08/01/2022 0836   HCT 43.1 12/13/2023 0929   PLT 257.0 12/13/2023 0929   PLT 282 08/01/2022 0836   MCV 90.7 12/13/2023 0929   MCV 93.7 12/02/2013 1003   MCH 30.3 08/01/2022 0836   MCHC 33.2 12/13/2023 0929   RDW 14.0 12/13/2023 0929   LYMPHSABS 2.7 12/13/2023 0929   MONOABS 0.7 12/13/2023 0929   EOSABS 1.7 (H) 12/13/2023 0929   BASOSABS 0.1 12/13/2023 0929      Latest Ref Rng & Units 02/12/2024    1:30 PM 02/09/2024   10:29 AM 10/25/2023   11:22 AM  BMP  Glucose 70 - 99 mg/dL 95  81  894   BUN 6 - 23 mg/dL 9  12  12    Creatinine 0.40 - 1.50 mg/dL 8.84  8.88  8.91   Sodium 135 - 145 mEq/L 139  138  138   Potassium 3.5 - 5.1 mEq/L 3.7  3.3  4.1   Chloride 96 - 112 mEq/L 100  100  100   CO2 19 - 32 mEq/L 30  32  30   Calcium  8.4 - 10.5 mg/dL 9.2  9.4  9.8    Chest imaging: CXR 09/17/21 1. Cardiomegaly. 2. Low lung volumes with mild atelectasis at the lung bases.  PFT:    Latest Ref Rng & Units 12/27/2021    9:39 AM  PFT Results  FVC-Pre L 2.47   FVC-Predicted Pre % 72   FVC-Post L 2.74   FVC-Predicted Post % 80   Pre FEV1/FVC % % 77   Post FEV1/FCV % % 76   FEV1-Pre L 1.90   FEV1-Predicted Pre % 76   FEV1-Post L 2.09   DLCO uncorrected ml/min/mmHg 18.59   DLCO UNC% % 80   DLCO corrected ml/min/mmHg 19.09   DLCO COR %Predicted % 82   DLVA Predicted % 96   TLC L 5.73   TLC % Predicted % 86   RV % Predicted % 122     Labs:  Path:  Echo:  Heart Catheterization:  Assessment & Plan:   Moderate persistent asthma without complication  Chronic obstructive pulmonary disease, unspecified COPD type (HCC) - Plan: Fluticasone -Umeclidin-Vilant (TRELEGY ELLIPTA ) 200-62.5-25 MCG/ACT AEPB  Discussion: Phillip Maynard is a 83 year old male, former smoker with GERD, hypertension and DMII who returns to pulmonary  clinic for asthma.  Moderate Persistent Asthma Peripheral Eosinophilia - continue trelegy elltipa 200, 1 puff daily - Continue montelukast  10mg  daily  Follow up in 1 year, call sooner if needed.  Dorn Chill, MD [facility] Office: [phone]   Current Outpatient Medications:    Accu-Chek Softclix Lancets lancets, Use as instructed, Disp: 100 each, Rfl: 6   albuterol  (VENTOLIN  HFA) 108 (90 Base) MCG/ACT inhaler, TAKE 2 PUFFS BY MOUTH EVERY 6 HOURS AS NEEDED FOR WHEEZE OR SHORTNESS OF BREATH, Disp: 8.5 each, Rfl: 1   atorvastatin  (LIPITOR) 10 MG tablet, TAKE 1 TABLET BY MOUTH EVERY DAY, Disp: 90 tablet, Rfl: 1   blood glucose meter kit and supplies, Dispense based on patient and insurance preference. Use up to four times daily as directed. (FOR ICD-10 E10.9,  E11.9)., Disp: 1 each, Rfl: 0   Blood Glucose Monitoring Suppl DEVI, 1 each by Does not apply route in the morning, at noon, and at bedtime. May substitute to any manufacturer covered by patient's insurance., Disp: 1 each, Rfl: 0   finasteride  (PROSCAR ) 5 MG tablet, Take 1 tablet (5 mg total) by mouth daily., Disp: 90 tablet, Rfl: 3   fluticasone  (FLONASE ) 50 MCG/ACT nasal spray, Place 2 sprays into both nostrils daily., Disp: 16 g, Rfl: 6   losartan -hydrochlorothiazide  (HYZAAR) 50-12.5 MG tablet, TAKE 1 TABLET BY MOUTH EVERY DAY, Disp: 90 tablet, Rfl: 1   montelukast  (SINGULAIR ) 10 MG tablet, Take 1 tablet (10 mg total) by mouth at bedtime., Disp: 30 tablet, Rfl: 3   tadalafil  (CIALIS ) 20 MG tablet, Take 1 tablet (20 mg total) by mouth daily as needed., Disp: 30 tablet, Rfl: 5   tamsulosin  (FLOMAX ) 0.4 MG CAPS capsule, Take 2 capsules (0.8 mg total) by mouth at bedtime., Disp: 60 capsule, Rfl: 11   triamcinolone  cream (KENALOG ) 0.1 %, Apply topically 2 (two) times daily as needed. APPLY TO AFFECTED AREA TWICE A DAY, Disp: 30 g, Rfl: 1   Fluticasone -Umeclidin-Vilant (TRELEGY ELLIPTA ) 200-62.5-25 MCG/ACT AEPB,  Inhale 1 puff into the lungs daily., Disp: 28 each, Rfl: 11

## 2024-03-13 NOTE — Patient Instructions (Addendum)
 Continue trelegy ellipta  200, 1 puff daily - rinse mouth out after each use   Continue montelukast  10mg  daily  Follow up in 1 year, call sooner if needed

## 2024-03-14 ENCOUNTER — Encounter: Payer: Self-pay | Admitting: Pulmonary Disease

## 2024-04-12 DIAGNOSIS — D721 Eosinophilia, unspecified: Secondary | ICD-10-CM | POA: Diagnosis not present

## 2024-04-12 DIAGNOSIS — J4540 Moderate persistent asthma, uncomplicated: Secondary | ICD-10-CM | POA: Diagnosis not present

## 2024-05-10 ENCOUNTER — Encounter: Payer: Self-pay | Admitting: Family Medicine

## 2024-05-10 ENCOUNTER — Ambulatory Visit: Admitting: Family Medicine

## 2024-05-10 VITALS — BP 136/58 | HR 83 | Temp 98.9°F | Resp 18 | Ht 69.0 in | Wt 167.6 lb

## 2024-05-10 DIAGNOSIS — S80811A Abrasion, right lower leg, initial encounter: Secondary | ICD-10-CM | POA: Diagnosis not present

## 2024-05-10 DIAGNOSIS — M545 Low back pain, unspecified: Secondary | ICD-10-CM

## 2024-05-10 DIAGNOSIS — E1165 Type 2 diabetes mellitus with hyperglycemia: Secondary | ICD-10-CM

## 2024-05-10 DIAGNOSIS — N401 Enlarged prostate with lower urinary tract symptoms: Secondary | ICD-10-CM | POA: Diagnosis not present

## 2024-05-10 DIAGNOSIS — L853 Xerosis cutis: Secondary | ICD-10-CM

## 2024-05-10 LAB — COMPREHENSIVE METABOLIC PANEL WITH GFR
ALT: 11 U/L (ref 0–53)
AST: 19 U/L (ref 0–37)
Albumin: 4.5 g/dL (ref 3.5–5.2)
Alkaline Phosphatase: 56 U/L (ref 39–117)
BUN: 14 mg/dL (ref 6–23)
CO2: 31 meq/L (ref 19–32)
Calcium: 9.9 mg/dL (ref 8.4–10.5)
Chloride: 97 meq/L (ref 96–112)
Creatinine, Ser: 1.08 mg/dL (ref 0.40–1.50)
GFR: 64.47 mL/min (ref >60.00)
Glucose, Bld: 135 mg/dL — ABNORMAL HIGH (ref 70–99)
Potassium: 3.9 meq/L (ref 3.5–5.1)
Sodium: 135 meq/L (ref 135–145)
Total Bilirubin: 1 mg/dL (ref 0.2–1.2)
Total Protein: 8.4 g/dL — ABNORMAL HIGH (ref 6.0–8.3)

## 2024-05-10 LAB — HEMOGLOBIN A1C: Hgb A1c MFr Bld: 7.3 % — ABNORMAL HIGH (ref 4.6–6.5)

## 2024-05-10 MED ORDER — FINASTERIDE 5 MG PO TABS: 5.0000 mg | ORAL_TABLET | Freq: Every day | ORAL | 3 refills | Status: AC

## 2024-05-10 NOTE — Patient Instructions (Signed)
 Call eye specialist for appointment for diabetic eye screen.  Cut out sodas - use as a treat only. Glad the back pain is better. Return to the clinic or go to the nearest emergency room if any of your symptoms worsen or new symptoms occur. Lotion for dry skin, try not to scratch areas and lower leg should continue to improve.  RSV vaccine can be given at your pharmacy.   Thank you for coming in today. No change in medications at this time. If there are any concerns on your bloodwork, I will let you know. Take care!

## 2024-05-10 NOTE — Progress Notes (Signed)
 Subjective:  Patient ID: Phillip Maynard., male    DOB: 19-May-1941  Age: 83 y.o. MRN: [id number]  CC:  Chief Complaint  Patient presents with   Diabetes   Back Pain    Clemens 2 weeks ago. Tripped. Been using OTC medication to help. Still a little sore   Leg Injury    Seems to be healing fine but wife is concerned. Patient scratched his legs    HPI Phillip Maynard. presents for   Multiple concerns above  Diabetes: With history of hyperglycemia, diet controlled with borderline A1c of 7.3 both in June and March.  Goal of under 7.5 given his age.  Has taken metformin  previously.  He is on statin with Lipitor 10 mg daily, ARB with losartan  HCTZ. Dietary guidance given at his June visit with sweets but was avoiding for the most part. - still drinking pepsi.  Home readings fasting:129-130 Postprandial:155 No symptomatic lows. Microalbumin: Normal ratio of 10.7 in November 2024. Optho, foot exam, pneumovax: Optho appointment recommended at his last visit. Has not yet made appt.   Lab Results  Component Value Date   HGBA1C 7.3 (H) 02/09/2024   HGBA1C 7.3 (H) 10/25/2023   HGBA1C 6.5 06/28/2023   Lab Results  Component Value Date   MICROALBUR 11.5 09/23/2015   LDLCALC 67 02/09/2024   CREATININE 1.15 02/12/2024   Back pain History of fall as above, approximately 2 weeks ago - tripped on a tire, fell onto left side, twisted. Sore on left low back/side. Applied some icy hot, massage. Improved. Unable to find area of pain. Minimal soreness.  No bowel or bladder incontinence, no saddle anesthesia, no lower extremity weakness.   Leg wound R leg wound - past week - scratched, scab formed. Some dry skin, itchy feeling. No parasthesias. Working outside - insect bites at times. 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 No longer on medicine   Past Surgical History:  Procedure Laterality Date   COLONOSCOPY  last 09/06/2016   CYSTOSCOPY N/A 09/08/2016   Procedure: FIREFLY INJECTIONS;  Surgeon: Belvie LITTIE Clara, MD;  Location: [facility];  Service: Urology;  Laterality: N/A;   CYSTOSCOPY W/ RETROGRADES Bilateral 11/18/2021  Procedure: CYSTOSCOPY WITH RETROGRADE PYELOGRAM;  Surgeon: Sherrilee Belvie CROME, MD;  Location: [facility];  Service: Urology;  Laterality: Bilateral;   CYSTOSCOPY WITH RETROGRADE PYELOGRAM, URETEROSCOPY AND STENT PLACEMENT Bilateral 12/22/2022   Procedure: CYSTOSCOPY WITH RETROGRADE PYELOGRAM;  Surgeon: Sherrilee Belvie CROME, MD;  Location: [facility];  Service: Urology;  Laterality: Bilateral;   INCISION AND DRAINAGE OF WOUND Right 1970's   leg (04/08/2013)   LIVER BIOPSY  ~ 2011   POLYPECTOMY     PROCTOSCOPY N/A 09/08/2016   Procedure: RIGID PROCTOSCOPY;  Surgeon: Elspeth Schultze, MD;  Location: [facility];  Service: General;  Laterality: N/A;   TONSILLECTOMY     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   Procedure: TRANSURETHRAL RESECTION OF BLADDER TUMOR (TURBT);  Surgeon: Sherrilee Belvie CROME, MD;  Location: [facility];  Service: Urology;  Laterality: N/A;   TRANSURETHRAL RESECTION OF BLADDER TUMOR N/A 12/22/2022   Procedure: TRANSURETHRAL RESECTION OF BLADDER TUMOR (TURBT);  Surgeon: Sherrilee Belvie CROME, MD;  Location: [facility];  Service: Urology;  Laterality: N/A;   URETERAL BIOPSY Bilateral 12/22/2022   Procedure: URETERAL BIOPSY-selective cytologies;  Surgeon: Sherrilee Belvie CROME, MD;  Location: [facility];  Service: Urology;  Laterality: Bilateral;   Allergies  Allergen Reactions   Penicillins Anaphylaxis and Other (See Comments)    Has patient had a PCN reaction causing immediate rash, facial/tongue/throat swelling, SOB or lightheadedness with hypotension: Yes Has patient had a PCN reaction causing severe rash involving mucus membranes or skin necrosis: No Has patient had a PCN reaction that required hospitalization No Has patient had a PCN reaction occurring within the last 10 years: No If all of the above answers are NO, then may proceed with Cephalosporin use.   Shellfish Allergy Anaphylaxis    Shrimp  crabs   Prior to Admission medications   Medication Sig Start Date End Date Taking? Authorizing Provider  Accu-Chek Softclix Lancets lancets Use as instructed 03/21/22  Yes Levora Reyes SAUNDERS, MD  albuterol  (VENTOLIN  HFA) 108 (90 Base) MCG/ACT inhaler TAKE 2 PUFFS BY MOUTH EVERY 6 HOURS AS NEEDED FOR WHEEZE OR SHORTNESS OF BREATH 02/09/24  Yes Levora Reyes SAUNDERS, MD  atorvastatin  (LIPITOR) 10 MG tablet TAKE 1 TABLET BY MOUTH EVERY DAY 01/30/24  Yes Levora Reyes SAUNDERS, MD  blood glucose meter kit and supplies Dispense based on patient and insurance preference. Use up to four times daily as directed. (FOR ICD-10 E10.9, E11.9). 09/29/21  Yes Levora Reyes SAUNDERS, MD   Blood Glucose Monitoring Suppl DEVI 1 each by Does not apply route in the morning, at noon, and at bedtime. May substitute to any manufacturer covered by patient's insurance. 07/19/23  Yes Levora Reyes SAUNDERS, MD  finasteride  (PROSCAR ) 5 MG tablet Take 1 tablet (5 mg total) by mouth daily. 08/02/23  Yes McKenzie, Belvie CROME, MD  fluticasone  (FLONASE ) 50 MCG/ACT nasal spray Place 2 sprays into both nostrils daily. 02/09/24  Yes Levora Reyes SAUNDERS, MD  Fluticasone -Umeclidin-Vilant (TRELEGY ELLIPTA ) 200-62.5-25 MCG/ACT AEPB Inhale 1 puff into the lungs daily. 03/13/24  Yes Kara Dorn NOVAK, MD  losartan -hydrochlorothiazide  (HYZAAR) 50-12.5 MG tablet TAKE 1 TABLET BY MOUTH EVERY DAY 01/02/24  Yes Levora Reyes SAUNDERS, MD  montelukast  (SINGULAIR ) 10 MG tablet Take 1 tablet (10 mg total) by mouth at bedtime. 02/09/24  Yes Levora Reyes SAUNDERS, MD  tadalafil  (CIALIS ) 20 MG tablet Take 1 tablet (20 mg total) by mouth daily as needed. 08/02/23  Yes McKenzie, Belvie CROME, MD  tamsulosin  (FLOMAX ) 0.4 MG CAPS capsule Take  2 capsules (0.8 mg total) by mouth at bedtime. 08/02/23  Yes McKenzie, Belvie CROME, MD  triamcinolone  cream (KENALOG ) 0.1 % Apply topically 2 (two) times daily as needed. APPLY TO AFFECTED AREA TWICE A DAY 02/09/24  Yes Levora Reyes SAUNDERS, MD   Social History   Socioeconomic History   Marital status: Married    Spouse name: Not on file   Number of children: 2   Years of education: Not on file   Highest education level: Not on file  Occupational History   Occupation: truck driver    Employer: [employer]    Comment: retired  Tobacco Use   Smoking status: Former    Current packs/day: 0.00    Average packs/day: 0.3 packs/day for 45.0 years (11.3 ttl pk-yrs)    Types: Cigarettes    Start date: 03/05/1976    Quit date: 03/05/2021    Years since quitting: 3.1   Smokeless tobacco: Never  Vaping Use   Vaping status: Never Used  Substance and Sexual Activity   Alcohol use: Yes    Alcohol/week: 12.0  standard drinks of alcohol    Types: 12 Cans of beer per week    Comment: 12 pack a week.   Drug use: No   Sexual activity: Yes  Other Topics Concern   Not on file  Social History Narrative   Pt lives in 1 story home with his wife   Has 2 adult children   Highest level of education: GED & Trade school   Retired Engineer, maintenance.    Social Drivers of Corporate investment banker Strain: Low Risk  (08/01/2023)   Overall Financial Resource Strain (CARDIA)    Difficulty of Paying Living Expenses: Not hard at all  Food Insecurity: No Food Insecurity (08/01/2023)   Hunger Vital Sign    Worried About Running Out of Food in the Last Year: Never true    Ran Out of Food in the Last Year: Never true  Transportation Needs: No Transportation Needs (08/01/2023)   PRAPARE - Administrator, Civil Service (Medical): No    Lack of Transportation (Non-Medical): No  Physical Activity: Inactive (08/01/2023)   Exercise Vital Sign    Days of Exercise per Week: 0 days    Minutes of Exercise per Session: 0 min  Stress: No Stress Concern Present (08/01/2023)   [employer] of Occupational Health - Occupational Stress Questionnaire    Feeling of Stress : Not at all  Social Connections: Moderately Isolated (08/01/2023)   Social Connection and Isolation Panel    Frequency of Communication with Friends and Family: Twice a week    Frequency of Social Gatherings with Friends and Family: Twice a week    Attends Religious Services: Never    Database administrator or Organizations: No    Attends Banker Meetings: Never    Marital Status: Married  Catering manager Violence: Not At Risk (08/01/2023)   Humiliation, Afraid, Rape, and Kick questionnaire    Fear of Current or Ex-Partner: No    Emotionally Abused: No    Physically Abused: No    Sexually Abused: No    Review of Systems  Constitutional:  Negative for fatigue and unexpected weight change.  Eyes:  Negative for  visual disturbance.  Respiratory:  Negative for cough, chest tightness and shortness of breath.   Cardiovascular:  Negative for chest pain, palpitations and leg swelling.  Gastrointestinal:  Negative for abdominal pain and blood in  stool.  Neurological:  Negative for dizziness, light-headedness and headaches.    Objective:   Vitals:   05/10/24 0856  BP: (!) 136/58  Pulse: 83  Resp: 18  Temp: 98.9 F (37.2 C)  TempSrc: Temporal  SpO2: 98%  Weight: 167 lb 9.6 oz (76 kg)  Height: 5' 9 (1.753 m)     Physical Exam Vitals reviewed.  Constitutional:      Appearance: He is well-developed.  HENT:     Head: Normocephalic and atraumatic.  Neck:     Vascular: No carotid bruit or JVD.  Cardiovascular:     Rate and Rhythm: Normal rate and regular rhythm.     Heart sounds: Normal heart sounds. No murmur heard. Pulmonary:     Effort: Pulmonary effort is normal.     Breath sounds: Normal breath sounds. No rales.  Musculoskeletal:     Right lower leg: No edema.     Left lower leg: No edema.     Comments: Lumbar spine, no midline bony tenderness, no paraspinal tenderness.  Left flank, left ribs nontender.  Ambulating without assistance.  Skin:    General: Skin is warm and dry.     Comments: Skin dry skin noted on bilateral lower legs, abrasion with overlying eschar lower leg, no surrounding erythema.  See photo.   Neurological:     Mental Status: He is alert and oriented to person, place, and time.  Psychiatric:        Mood and Affect: Mood normal.         Assessment & Plan:  Jaceon Heiberger. is a 83 y.o. male . Acute left-sided low back pain without sciatica  - Mechanical fall as above with improving pain, no focal bony tenderness.  Hold on imaging at this time with RTC precautions if not continuing to improve.  Benign localized prostatic hyperplasia with lower urinary tract symptoms (LUTS) - Plan: finasteride  (PROSCAR ) 5 MG tablet  - Finasteride  refilled  Type 2  diabetes mellitus with hyperglycemia, without long-term current use of insulin (HCC) - Plan: Comprehensive metabolic panel with GFR, Hemoglobin A1c  - Stressed importance of ophthalmology follow-up.  We also discussed avoidance of sugar containing beverages and reviewed in office amount of sugar within his beverages demonstrating 1 packet of sugar as 1 g of sugar and the amount of sugar within a soda.  He plans to cut back.  Abrasion of right lower extremity, initial encounter Dry skin dermatitis  - Discussed applying lotion to lower legs throughout the day especially after bathing, wound on right lower leg appears to be healing.  RTC precautions.  Meds ordered this encounter  Medications   finasteride  (PROSCAR ) 5 MG tablet    Sig: Take 1 tablet (5 mg total) by mouth daily.    Dispense:  90 tablet    Refill:  3   Patient Instructions  Call eye specialist for appointment for diabetic eye screen.  Cut out sodas - use as a treat only. Glad the back pain is better. Return to the clinic or go to the nearest emergency room if any of your symptoms worsen or new symptoms occur. Lotion for dry skin, try not to scratch areas and lower leg should continue to improve.  RSV vaccine can be given at your pharmacy.   Thank you for coming in today. No change in medications at this time. If there are any concerns on your bloodwork, I will let you know. Take care!     Signed,  Reyes Pines, MD [facility], [facility] 05/10/24 9:48 AM

## 2024-05-12 ENCOUNTER — Ambulatory Visit: Payer: Self-pay | Admitting: Family Medicine

## 2024-05-15 ENCOUNTER — Ambulatory Visit: Admitting: Urology

## 2024-05-15 VITALS — BP 147/62 | HR 69

## 2024-05-15 DIAGNOSIS — Z08 Encounter for follow-up examination after completed treatment for malignant neoplasm: Secondary | ICD-10-CM | POA: Diagnosis not present

## 2024-05-15 DIAGNOSIS — C679 Malignant neoplasm of bladder, unspecified: Secondary | ICD-10-CM

## 2024-05-15 DIAGNOSIS — Z8551 Personal history of malignant neoplasm of bladder: Secondary | ICD-10-CM

## 2024-05-15 LAB — URINALYSIS, ROUTINE W REFLEX MICROSCOPIC
Bilirubin, UA: NEGATIVE
Glucose, UA: NEGATIVE
Ketones, UA: NEGATIVE
Leukocytes,UA: NEGATIVE
Nitrite, UA: NEGATIVE
Protein,UA: NEGATIVE
RBC, UA: NEGATIVE
Specific Gravity, UA: 1.01 (ref 1.005–1.030)
Urobilinogen, Ur: 1 mg/dL (ref 0.2–1.0)
pH, UA: 6 (ref 5.0–7.5)

## 2024-05-15 MED ORDER — TADALAFIL 20 MG PO TABS: 20.0000 mg | ORAL_TABLET | Freq: Every day | ORAL | 5 refills | Status: AC | PRN

## 2024-05-15 MED ORDER — CIPROFLOXACIN HCL 500 MG PO TABS
500.0000 mg | ORAL_TABLET | Freq: Once | ORAL | Status: AC
  Administered 2024-05-15: 500 mg via ORAL

## 2024-05-15 NOTE — Progress Notes (Signed)
   05/15/24  CC: followup bladder cancer  HPI: Phillip Maynard is a 83yo here for cystoscopy for bladder cancer Blood pressure (!) 147/62, pulse 69. NED. A&Ox3.   No respiratory distress   Abd soft, NT, ND Normal phallus with bilateral descended testicles  Cystoscopy Procedure Note  Patient identification was confirmed, informed consent was obtained, and patient was prepped using Betadine solution.  Lidocaine  jelly was administered per urethral meatus.     Pre-Procedure: - Inspection reveals a normal caliber ureteral meatus.  Procedure: The flexible cystoscope was introduced without difficulty - No urethral strictures/lesions are present. - Enlarged prostate  - Normal bladder neck - Bilateral ureteral orifices identified - Bladder mucosa  reveals no ulcers, tumors, or lesions - No bladder stones - No trabeculation     Post-Procedure: - Patient tolerated the procedure well  Assessment/ Plan: Followup 6 months for cystoscopy  No follow-ups on file.  Belvie Clara, MD

## 2024-05-21 ENCOUNTER — Encounter: Payer: Self-pay | Admitting: Urology

## 2024-05-21 NOTE — Patient Instructions (Signed)

## 2024-06-06 ENCOUNTER — Other Ambulatory Visit: Payer: Self-pay | Admitting: Family Medicine

## 2024-06-06 DIAGNOSIS — J4540 Moderate persistent asthma, uncomplicated: Secondary | ICD-10-CM

## 2024-06-06 DIAGNOSIS — R059 Cough, unspecified: Secondary | ICD-10-CM

## 2024-06-12 ENCOUNTER — Other Ambulatory Visit: Payer: Self-pay | Admitting: Family Medicine

## 2024-06-12 DIAGNOSIS — I10 Essential (primary) hypertension: Secondary | ICD-10-CM

## 2024-07-03 NOTE — Progress Notes (Signed)
 Phillip Maynard.                                          MRN: [id number]   07/03/2024   The [facility] Quality Team Specialist reviewed this patient medical record for the purposes of chart review for care gap closure. The following were reviewed: chart review for care gap closure-kidney health evaluation for diabetes:eGFR  and uACR.    [facility] Quality Team

## 2024-07-06 ENCOUNTER — Other Ambulatory Visit: Payer: Self-pay | Admitting: Urology

## 2024-07-06 DIAGNOSIS — N401 Enlarged prostate with lower urinary tract symptoms: Secondary | ICD-10-CM

## 2024-07-17 LAB — OPHTHALMOLOGY REPORT-SCANNED

## 2024-07-30 NOTE — Progress Notes (Signed)
 Phillip Maynard.                                          MRN: [id number]   07/30/2024   The [facility] Quality Team Specialist reviewed this patient medical record for the purposes of chart review for care gap closure. The following were reviewed: chart review for care gap closure-kidney health evaluation for diabetes:eGFR  and uACR.    [facility] Quality Team

## 2024-08-06 ENCOUNTER — Ambulatory Visit (INDEPENDENT_AMBULATORY_CARE_PROVIDER_SITE_OTHER): Admitting: *Deleted

## 2024-08-06 DIAGNOSIS — Z0000 Encounter for general adult medical examination without abnormal findings: Secondary | ICD-10-CM

## 2024-08-06 NOTE — Patient Instructions (Signed)
 [name],  Thank you for taking the time for your Medicare Wellness Visit. I appreciate your continued commitment to your health goals. Please review the care plan we discussed, and feel free to reach out if I can assist you further.  Please note that Annual Wellness Visits do not include a physical exam. Some assessments may be limited, especially if the visit was conducted virtually. If needed, we may recommend an in-person follow-up with your provider.  Ongoing Care Seeing your primary care provider every 3 to 6 months helps us  monitor your health and provide consistent, personalized care.   Referrals If a referral was made during today's visit and you haven't received any updates within two weeks, please contact the referred provider directly to check on the status.  Recommended Screenings:  Health Maintenance  Topic Date Due   Zoster (Shingles) Vaccine (1 of 2) Never done   Yearly kidney health urinalysis for diabetes  05/05/2020   Flu Shot  03/15/2024   COVID-19 Vaccine (4 - 2025-26 season) 04/15/2024   Complete foot exam   10/24/2024   Hemoglobin A1C  11/07/2024   Yearly kidney function blood test for diabetes  05/10/2025   Eye exam for diabetics  07/17/2025   Medicare Annual Wellness Visit  08/06/2025   DTaP/Tdap/Td vaccine (3 - Td or Tdap) 05/14/2027   Pneumococcal Vaccine for age over 52  Completed   Meningitis B Vaccine  Aged Out   Hepatitis B Vaccine  Discontinued   Colon Cancer Screening  Discontinued   Hepatitis C Screening  Discontinued       08/06/2024   10:14 AM  Advanced Directives  Does Patient Have a Medical Advance Directive? No  Would patient like information on creating a medical advance directive? No - Patient declined    Vision: Annual vision screenings are recommended for early detection of glaucoma, cataracts, and diabetic retinopathy. These exams can also reveal signs of chronic conditions such as diabetes and high blood pressure.  Dental:  Annual dental screenings help detect early signs of oral cancer, gum disease, and other conditions linked to overall health, including heart disease and diabetes.  Please see the attached documents for additional preventive care recommendations.    [name] , Thank you for taking time to come for your Medicare Wellness Visit. I appreciate your ongoing commitment to your health goals. Please review the following plan we discussed and let me know if I can assist you in the future.   Screening recommendations/referrals: Colonoscopy:  Recommended yearly ophthalmology/optometry visit for glaucoma screening and checkup Recommended yearly dental visit for hygiene and checkup  Vaccinations: Influenza vaccine:  Pneumococcal vaccine:  Tdap vaccine:  Shingles vaccine:       Preventive Care 65 Years and Older, Male Preventive care refers to lifestyle choices and visits with your health care provider that can promote health and wellness. What does preventive care include? A yearly physical exam. This is also called an annual well check. Dental exams once or twice a year. Routine eye exams. Ask your health care provider how often you should have your eyes checked. Personal lifestyle choices, including: Daily care of your teeth and gums. Regular physical activity. Eating a healthy diet. Avoiding tobacco and drug use. Limiting alcohol use. Practicing safe sex. Taking low doses of aspirin every day. Taking vitamin and mineral supplements as recommended by your health care provider. What happens during an annual well check? The services and screenings done by your health care provider during your annual well  check will depend on your age, overall health, lifestyle risk factors, and family history of disease. Counseling  Your health care provider may ask you questions about your: Alcohol use. Tobacco use. Drug use. Emotional well-being. Home and relationship well-being. Sexual  activity. Eating habits. History of falls. Memory and ability to understand (cognition). Work and work astronomer. Screening  You may have the following tests or measurements: Height, weight, and BMI. Blood pressure. Lipid and cholesterol levels. These may be checked every 5 years, or more frequently if you are over 92 years old. Skin check. Lung cancer screening. You may have this screening every year starting at age 35 if you have a 30-pack-year history of smoking and currently smoke or have quit within the past 15 years. Fecal occult blood test (FOBT) of the stool. You may have this test every year starting at age 74. Flexible sigmoidoscopy or colonoscopy. You may have a sigmoidoscopy every 5 years or a colonoscopy every 10 years starting at age 37. Prostate cancer screening. Recommendations will vary depending on your family history and other risks. Hepatitis C blood test. Hepatitis B blood test. Sexually transmitted disease (STD) testing. Diabetes screening. This is done by checking your blood sugar (glucose) after you have not eaten for a while (fasting). You may have this done every 1-3 years. Abdominal aortic aneurysm (AAA) screening. You may need this if you are a current or former smoker. Osteoporosis. You may be screened starting at age 26 if you are at high risk. Talk with your health care provider about your test results, treatment options, and if necessary, the need for more tests. Vaccines  Your health care provider may recommend certain vaccines, such as: Influenza vaccine. This is recommended every year. Tetanus, diphtheria, and acellular pertussis (Tdap, Td) vaccine. You may need a Td booster every 10 years. Zoster vaccine. You may need this after age 45. Pneumococcal 13-valent conjugate (PCV13) vaccine. One dose is recommended after age 25. Pneumococcal polysaccharide (PPSV23) vaccine. One dose is recommended after age 35. Talk to your health care provider about which  screenings and vaccines you need and how often you need them. This information is not intended to replace advice given to you by your health care provider. Make sure you discuss any questions you have with your health care provider. Document Released: 08/28/2015 Document Revised: 04/20/2016 Document Reviewed: 06/02/2015 Elsevier Interactive Patient Education  2017 [employer].  Fall Prevention in the Home Falls can cause injuries. They can happen to people of all ages. There are many things you can do to make your home safe and to help prevent falls. What can I do on the outside of my home? Regularly fix the edges of walkways and driveways and fix any cracks. Remove anything that might make you trip as you walk through a door, such as a raised step or threshold. Trim any bushes or trees on the path to your home. Use bright outdoor lighting. Clear any walking paths of anything that might make someone trip, such as rocks or tools. Regularly check to see if handrails are loose or broken. Make sure that both sides of any steps have handrails. Any raised decks and porches should have guardrails on the edges. Have any leaves, snow, or ice cleared regularly. Use sand or salt on walking paths during winter. Clean up any spills in your garage right away. This includes oil or grease spills. What can I do in the bathroom? Use night lights. Install grab bars by the toilet and  in the tub and shower. Do not use towel bars as grab bars. Use non-skid mats or decals in the tub or shower. If you need to sit down in the shower, use a plastic, non-slip stool. Keep the floor dry. Clean up any water  that spills on the floor as soon as it happens. Remove soap buildup in the tub or shower regularly. Attach bath mats securely with double-sided non-slip rug tape. Do not have throw rugs and other things on the floor that can make you trip. What can I do in the bedroom? Use night lights. Make sure that you have a  light by your bed that is easy to reach. Do not use any sheets or blankets that are too big for your bed. They should not hang down onto the floor. Have a firm chair that has side arms. You can use this for support while you get dressed. Do not have throw rugs and other things on the floor that can make you trip. What can I do in the kitchen? Clean up any spills right away. Avoid walking on wet floors. Keep items that you use a lot in easy-to-reach places. If you need to reach something above you, use a strong step stool that has a grab bar. Keep electrical cords out of the way. Do not use floor polish or wax that makes floors slippery. If you must use wax, use non-skid floor wax. Do not have throw rugs and other things on the floor that can make you trip. What can I do with my stairs? Do not leave any items on the stairs. Make sure that there are handrails on both sides of the stairs and use them. Fix handrails that are broken or loose. Make sure that handrails are as long as the stairways. Check any carpeting to make sure that it is firmly attached to the stairs. Fix any carpet that is loose or worn. Avoid having throw rugs at the top or bottom of the stairs. If you do have throw rugs, attach them to the floor with carpet tape. Make sure that you have a light switch at the top of the stairs and the bottom of the stairs. If you do not have them, ask someone to add them for you. What else can I do to help prevent falls? Wear shoes that: Do not have high heels. Have rubber bottoms. Are comfortable and fit you well. Are closed at the toe. Do not wear sandals. If you use a stepladder: Make sure that it is fully opened. Do not climb a closed stepladder. Make sure that both sides of the stepladder are locked into place. Ask someone to hold it for you, if possible. Clearly mark and make sure that you can see: Any grab bars or handrails. First and last steps. Where the edge of each step  is. Use tools that help you move around (mobility aids) if they are needed. These include: Canes. Walkers. Scooters. Crutches. Turn on the lights when you go into a dark area. Replace any light bulbs as soon as they burn out. Set up your furniture so you have a clear path. Avoid moving your furniture around. If any of your floors are uneven, fix them. If there are any pets around you, be aware of where they are. Review your medicines with your doctor. Some medicines can make you feel dizzy. This can increase your chance of falling. Ask your doctor what other things that you can do to help prevent falls. This  information is not intended to replace advice given to you by your health care provider. Make sure you discuss any questions you have with your health care provider. Document Released: 05/28/2009 Document Revised: 01/07/2016 Document Reviewed: 09/05/2014 Elsevier Interactive Patient Education  2017 [employer].

## 2024-08-06 NOTE — Progress Notes (Signed)
 "  Chief Complaint  Patient presents with   Medicare Wellness     Subjective:   Phillip Granzow. is a 83 y.o. male who presents for a Medicare Annual Wellness Visit.  No voiced or noted concerns at this time Patient advised to keep follow-up appointment with PCP (08-12-2024)   Visit info / Clinical Intake: Medicare Wellness Visit Type:: Subsequent Annual Wellness Visit Persons participating in visit and providing information:: patient Medicare Wellness Visit Mode:: Telephone If telephone:: video declined Since this visit was completed virtually, some vitals may be partially provided or unavailable. Missing vitals are due to the limitations of the virtual format.: Unable to obtain vitals - no equipment If Telephone or Video please confirm:: I connected with patient using audio/video enable telemedicine. I verified patient identity with two identifiers, discussed telehealth limitations, and patient agreed to proceed. Patient Location:: Home Provider Location:: Home Interpreter Needed?: No Pre-visit prep was completed: no AWV questionnaire completed by patient prior to visit?: no Living arrangements:: lives with spouse/significant other Patient's Overall Health Status Rating: good Typical amount of pain: some Does pain affect daily life?: no Are you currently prescribed opioids?: no  Dietary Habits and Nutritional Risks How many meals a day?: 2 Eats fruit and vegetables daily?: yes Most meals are obtained by: preparing own meals In the last 2 weeks, have you had any of the following?: none Diabetic:: (!) yes Any non-healing wounds?: no How often do you check your BS?: 1 Would you like to be referred to a Nutritionist or for Diabetic Management? : no  Functional Status Activities of Daily Living (to include ambulation/medication): Independent Ambulation: Independent Medication Administration: Independent Home Management (perform basic housework or laundry):  Independent Manage your own finances?: yes Primary transportation is: driving Concerns about vision?: no *vision screening is required for WTM* Concerns about hearing?: no  Fall Screening Falls in the past year?: 0 Number of falls in past year: 0 Was there an injury with Fall?: 0 Fall Risk Category Calculator: 0 Patient Fall Risk Level: Low Fall Risk  Fall Risk Patient at Risk for Falls Due to: No Fall Risks Fall risk Follow up: Falls evaluation completed; Education provided; Falls prevention discussed  Home and Transportation Safety: All rugs have non-skid backing?: (!) no All stairs or steps have railings?: yes Grab bars in the bathtub or shower?: yes Have non-skid surface in bathtub or shower?: (!) no Good home lighting?: yes Regular seat belt use?: yes Hospital stays in the last year:: no  Cognitive Assessment Difficulty concentrating, remembering, or making decisions? : no Will 6CIT or Mini Cog be Completed: yes What year is it?: 0 points What month is it?: 0 points Give patient an address phrase to remember (5 components): It is very sunny outside today in December About what time is it?: 0 points Count backwards from 20 to 1: 0 points Say the months of the year in reverse: 2 points Repeat the address phrase from earlier: 0 points 6 CIT Score: 2 points  Advance Directives (For Healthcare) Does Patient Have a Medical Advance Directive?: No Would patient like information on creating a medical advance directive?: No - Patient declined  Reviewed/Updated  Reviewed/Updated: Reviewed All (Medical, Surgical, Family, Medications, Allergies, Care Teams, Patient Goals); Surgical History; Family History; Medications; Allergies; Care Teams; Patient Goals; Medical History    Allergies (verified) Penicillins and Shellfish allergy   Current Medications (verified) Outpatient Encounter Medications as of 08/06/2024  Medication Sig   Accu-Chek Softclix Lancets lancets Use as  instructed   albuterol  (VENTOLIN  HFA) 108 (90 Base) MCG/ACT inhaler TAKE 2 PUFFS BY MOUTH EVERY 6 HOURS AS NEEDED FOR WHEEZE OR SHORTNESS OF BREATH   atorvastatin  (LIPITOR) 10 MG tablet TAKE 1 TABLET BY MOUTH EVERY DAY   blood glucose meter kit and supplies Dispense based on patient and insurance preference. Use up to four times daily as directed. (FOR ICD-10 E10.9, E11.9).   Blood Glucose Monitoring Suppl DEVI 1 each by Does not apply route in the morning, at noon, and at bedtime. May substitute to any manufacturer covered by patient's insurance.   finasteride  (PROSCAR ) 5 MG tablet Take 1 tablet (5 mg total) by mouth daily.   fluticasone  (FLONASE ) 50 MCG/ACT nasal spray Place 2 sprays into both nostrils daily.   Fluticasone -Umeclidin-Vilant (TRELEGY ELLIPTA ) 200-62.5-25 MCG/ACT AEPB Inhale 1 puff into the lungs daily.   losartan -hydrochlorothiazide  (HYZAAR) 50-12.5 MG tablet TAKE 1 TABLET BY MOUTH EVERY DAY   montelukast  (SINGULAIR ) 10 MG tablet TAKE 1 TABLET BY MOUTH EVERYDAY AT BEDTIME   tadalafil  (CIALIS ) 20 MG tablet Take 1 tablet (20 mg total) by mouth daily as needed.   tamsulosin  (FLOMAX ) 0.4 MG CAPS capsule TAKE 2 CAPSULES BY MOUTH AT BEDTIME.   triamcinolone  cream (KENALOG ) 0.1 % Apply topically 2 (two) times daily as needed. APPLY TO AFFECTED AREA TWICE A DAY   No facility-administered encounter medications on file as of 08/06/2024.    History: Past Medical History:  Diagnosis Date   Asthma    when I was a boy (04/08/2013)   Bronchitis    Colon polyps    Colovesical fistula    COPD (chronic obstructive pulmonary disease) (HCC)    Diverticulosis    GERD (gastroesophageal reflux disease)    Hepatitis C    treated with injections   High cholesterol    Hypertension    Type II diabetes mellitus (HCC)    hx of . No longer on medicine   Past Surgical History:  Procedure Laterality Date   COLONOSCOPY  last 09/06/2016   CYSTOSCOPY N/A 09/08/2016   Procedure: FIREFLY  INJECTIONS;  Surgeon: Belvie LITTIE Clara, MD;  Location: [facility];  Service: Urology;  Laterality: N/A;   CYSTOSCOPY W/ RETROGRADES Bilateral 11/18/2021   Procedure: CYSTOSCOPY WITH RETROGRADE PYELOGRAM;  Surgeon: Clara Belvie LITTIE, MD;  Location: [facility];  Service: Urology;  Laterality: Bilateral;   CYSTOSCOPY WITH RETROGRADE PYELOGRAM, URETEROSCOPY AND STENT PLACEMENT Bilateral 12/22/2022   Procedure: CYSTOSCOPY WITH RETROGRADE PYELOGRAM;  Surgeon: Clara Belvie LITTIE, MD;  Location: [facility];  Service: Urology;  Laterality: Bilateral;   INCISION AND DRAINAGE OF WOUND Right 1970's   leg (04/08/2013)   LIVER BIOPSY  ~ 2011   POLYPECTOMY     PROCTOSCOPY N/A 09/08/2016   Procedure: RIGID PROCTOSCOPY;  Surgeon: Elspeth Schultze, MD;  Location: [facility];  Service: General;  Laterality: N/A;   TONSILLECTOMY     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   Procedure: TRANSURETHRAL RESECTION OF BLADDER TUMOR (TURBT);  Surgeon: Clara Belvie LITTIE, MD;  Location: [facility];  Service: Urology;  Laterality: N/A;   TRANSURETHRAL RESECTION OF BLADDER TUMOR N/A 12/22/2022   Procedure: TRANSURETHRAL RESECTION OF BLADDER TUMOR (TURBT);  Surgeon: Clara Belvie LITTIE, MD;  Location: [facility];  Service: Urology;  Laterality: N/A;   URETERAL BIOPSY Bilateral 12/22/2022   Procedure: URETERAL BIOPSY-selective cytologies;  Surgeon: Clara Belvie LITTIE, MD;  Location: [facility];  Service: Urology;  Laterality: Bilateral;   Family History  Problem Relation Age of Onset  Asthma Mother    Heart attack Mother    Colon polyps Neg Hx    Colon cancer Neg Hx    Esophageal cancer Neg Hx    Rectal cancer Neg Hx    Stomach cancer Neg Hx    Social History   Occupational History   Occupation: truck Air Traffic Controller: [employer]    Comment: retired  Tobacco Use   Smoking status: Former    Current packs/day: 0.00    Average packs/day: 0.3 packs/day for 45.0 years (11.3 ttl pk-yrs)    Types: Cigarettes    Start date:  03/05/1976    Quit date: 03/05/2021    Years since quitting: 3.4   Smokeless tobacco: Never  Vaping Use   Vaping status: Never Used  Substance and Sexual Activity   Alcohol use: Yes    Alcohol/week: 12.0 standard drinks of alcohol    Types: 12 Cans of beer per week    Comment: 12 pack a week.   Drug use: No   Sexual activity: Yes   Tobacco Counseling Counseling given: Not Answered  SDOH Screenings   Food Insecurity: No Food Insecurity (08/06/2024)  Housing: Unknown (08/06/2024)  Transportation Needs: No Transportation Needs (08/06/2024)  Utilities: Not At Risk (08/06/2024)  Alcohol Screen: Low Risk (08/01/2023)  Depression (PHQ2-9): Low Risk (08/06/2024)  Financial Resource Strain: Low Risk (08/01/2023)  Physical Activity: Inactive (08/06/2024)  Social Connections: Moderately Isolated (08/06/2024)  Stress: No Stress Concern Present (08/06/2024)  Tobacco Use: Medium Risk (08/06/2024)  Health Literacy: Adequate Health Literacy (08/06/2024)   See flowsheets for full screening details  Depression Screen PHQ 2 & 9 Depression Scale- Over the past 2 weeks, how often have you been bothered by any of the following problems? Little interest or pleasure in doing things: 0 Feeling down, depressed, or hopeless (PHQ Adolescent also includes...irritable): 0 PHQ-2 Total Score: 0 Trouble falling or staying asleep, or sleeping too much: 0 Feeling tired or having little energy: 0 Poor appetite or overeating (PHQ Adolescent also includes...weight loss): 0 Feeling bad about yourself - or that you are a failure or have let yourself or your family down: 0 Trouble concentrating on things, such as reading the newspaper or watching television (PHQ Adolescent also includes...like school work): 0 Moving or speaking so slowly that other people could have noticed. Or the opposite - being so fidgety or restless that you have been moving around a lot more than usual: 0 Thoughts that you would be better  off dead, or of hurting yourself in some way: 0 PHQ-9 Total Score: 0 If you checked off any problems, how difficult have these problems made it for you to do your work, take care of things at home, or get along with other people?: Not difficult at all     Goals Addressed             This Visit's Progress    Exercise 3x per week (30 min per time)   Not on track    Keep up work with walking and increase cardio     Patient Stated       Gain weight             Objective:    There were no vitals filed for this visit. There is no height or weight on file to calculate BMI.  Hearing/Vision screen Hearing Screening - Comments:: No trouble hearing Vision Screening - Comments:: [place]  Up to date Immunizations and Health Maintenance Health Maintenance  Topic Date Due   Zoster Vaccines- Shingrix (1 of 2) Never done   Diabetic kidney evaluation - Urine ACR  05/05/2020   Influenza Vaccine  03/15/2024   COVID-19 Vaccine (4 - 2025-26 season) 04/15/2024   FOOT EXAM  10/24/2024   HEMOGLOBIN A1C  11/07/2024   Diabetic kidney evaluation - eGFR measurement  05/10/2025   OPHTHALMOLOGY EXAM  07/17/2025   Medicare Annual Wellness (AWV)  08/06/2025   DTaP/Tdap/Td (3 - Td or Tdap) 05/14/2027   Pneumococcal Vaccine: 50+ Years  Completed   Meningococcal B Vaccine  Aged Out   Hepatitis B Vaccines 19-59 Average Risk  Discontinued   Colonoscopy  Discontinued   Hepatitis C Screening  Discontinued        Assessment/Plan:  This is a routine wellness examination for Phillip Maynard.  Patient Care Team: Levora Reyes SAUNDERS, MD as PCP - General (Family Medicine) Chauncey Redell Agent, MD as Consulting Physician (Urology) Sheldon Standing, MD as Consulting Physician (General Surgery) Nandigam, Kavitha V, MD as Consulting Physician (Gastroenterology) [facility]  I have personally reviewed and noted the following in the patients chart:   Medical and social history Use of alcohol,  tobacco or illicit drugs  Current medications and supplements including opioid prescriptions. Functional ability and status Nutritional status Physical activity Advanced directives List of other physicians Hospitalizations, surgeries, and ER visits in previous 12 months Vitals Screenings to include cognitive, depression, and falls Referrals and appointments  No orders of the defined types were placed in this encounter.  In addition, I have reviewed and discussed with patient certain preventive protocols, quality metrics, and best practice recommendations. A written personalized care plan for preventive services as well as general preventive health recommendations were provided to patient.   Mliss Graff, LPN   [id number]   Return in 1 year (on 08/06/2025).  After Visit Summary: (MyChart) Due to this being a telephonic visit, the after visit summary with patients personalized plan was offered to patient via MyChart   Nurse Notes:  "

## 2024-08-12 ENCOUNTER — Ambulatory Visit: Admitting: Family Medicine

## 2024-08-16 ENCOUNTER — Other Ambulatory Visit: Admitting: Urology

## 2024-08-23 ENCOUNTER — Ambulatory Visit: Admitting: Family Medicine

## 2024-08-23 ENCOUNTER — Encounter: Payer: Self-pay | Admitting: Family Medicine

## 2024-08-23 VITALS — BP 120/68 | HR 60 | Temp 98.2°F | Resp 14 | Ht 69.0 in | Wt 170.2 lb

## 2024-08-23 DIAGNOSIS — I10 Essential (primary) hypertension: Secondary | ICD-10-CM

## 2024-08-23 DIAGNOSIS — J4540 Moderate persistent asthma, uncomplicated: Secondary | ICD-10-CM | POA: Diagnosis not present

## 2024-08-23 DIAGNOSIS — E1165 Type 2 diabetes mellitus with hyperglycemia: Secondary | ICD-10-CM | POA: Diagnosis not present

## 2024-08-23 DIAGNOSIS — R059 Cough, unspecified: Secondary | ICD-10-CM | POA: Diagnosis not present

## 2024-08-23 DIAGNOSIS — E785 Hyperlipidemia, unspecified: Secondary | ICD-10-CM

## 2024-08-23 LAB — COMPREHENSIVE METABOLIC PANEL WITH GFR
ALT: 8 U/L (ref 3–53)
AST: 11 U/L (ref 5–37)
Albumin: 4.3 g/dL (ref 3.5–5.2)
Alkaline Phosphatase: 58 U/L (ref 39–117)
BUN: 12 mg/dL (ref 6–23)
CO2: 32 meq/L (ref 19–32)
Calcium: 9.8 mg/dL (ref 8.4–10.5)
Chloride: 99 meq/L (ref 96–112)
Creatinine, Ser: 1.11 mg/dL (ref 0.40–1.50)
GFR: 62.26 mL/min
Glucose, Bld: 119 mg/dL — ABNORMAL HIGH (ref 70–99)
Potassium: 4.2 meq/L (ref 3.5–5.1)
Sodium: 137 meq/L (ref 135–145)
Total Bilirubin: 0.6 mg/dL (ref 0.2–1.2)
Total Protein: 7.6 g/dL (ref 6.0–8.3)

## 2024-08-23 LAB — LIPID PANEL
Cholesterol: 128 mg/dL (ref 28–200)
HDL: 47.6 mg/dL
LDL Cholesterol: 65 mg/dL (ref 10–99)
NonHDL: 80.37
Total CHOL/HDL Ratio: 3
Triglycerides: 76 mg/dL (ref 10.0–149.0)
VLDL: 15.2 mg/dL (ref 0.0–40.0)

## 2024-08-23 LAB — MICROALBUMIN / CREATININE URINE RATIO
Creatinine,U: 73.2 mg/dL
Microalb Creat Ratio: 29.7 mg/g (ref 0.0–30.0)
Microalb, Ur: 2.2 mg/dL — ABNORMAL HIGH (ref 0.7–1.9)

## 2024-08-23 LAB — HEMOGLOBIN A1C: Hgb A1c MFr Bld: 6.9 % — ABNORMAL HIGH (ref 4.6–6.5)

## 2024-08-23 MED ORDER — MONTELUKAST SODIUM 10 MG PO TABS: 10.0000 mg | ORAL_TABLET | Freq: Every day | ORAL | 3 refills | Status: AC

## 2024-08-23 NOTE — Progress Notes (Unsigned)
 "  Subjective:  Patient ID: Phillip LELON Loring Mickey., male    DOB: 1940-10-17  Age: 84 y.o. MRN: [id number]  CC:  Chief Complaint  Patient presents with   Diabetes    Doing well. Patient does not check sugars.     HPI Phillip Maynard. presents for   Diabetes: With history of hyperglycemia.  Reasonable goal of less than 7.5 given age.  Treated with diet control, prior metformin .  He is on statin with Lipitor, ARB with losartan . Home readings fasting 118-120  Home readings postprandial - none.  No symptomatic lows. Has cut back on pepsi.   Microalbumin: Due Optho, foot exam, pneumovax: Up-to-date  Lab Results  Component Value Date   HGBA1C 7.3 (H) 05/10/2024   HGBA1C 7.3 (H) 02/09/2024   HGBA1C 7.3 (H) 10/25/2023   Lab Results  Component Value Date   MICROALBUR 11.5 09/23/2015   LDLCALC 67 02/09/2024   CREATININE 1.08 05/10/2024   Hyperlipidemia: Lipitor 10 mg daily. No new side effects, myalgias Lab Results  Component Value Date   CHOL 119 02/09/2024   HDL 33.50 (L) 02/09/2024   LDLCALC 67 02/09/2024   LDLDIRECT 71.0 04/07/2021   TRIG 96.0 02/09/2024   CHOLHDL 4 02/09/2024   Lab Results  Component Value Date   ALT 11 05/10/2024   AST 19 05/10/2024   ALKPHOS 56 05/10/2024   BILITOT 1.0 05/10/2024   COPD Followed by pulmonary. With moderate persistent asthma as well.  Treated with Trelegy, singulair . Working well - Has albuterol  as needed - 3 times per week. Some wheezing at night at times   Hypertension: Losartan  HCTZ 50/12.5 mg daily, no side effects with meds.  Home readings: none.  BP Readings from Last 3 Encounters:  08/23/24 120/68  05/15/24 (!) 147/62  05/10/24 (!) 136/58   Lab Results  Component Value Date   CREATININE 1.08 05/10/2024        History Patient Active Problem List   Diagnosis Date Noted   Morbid obesity (HCC) 02/09/2024   Obesity 02/09/2024   Pain in joint of left shoulder 01/14/2023   Pain in joint of right shoulder  01/14/2023   Other eosinophilia 01/19/2022   Bladder cancer (HCC) 12/07/2021   COPD (chronic obstructive pulmonary disease) (HCC) 10/13/2021   Benign localized prostatic hyperplasia with lower urinary tract symptoms (LUTS) 01/13/2021   Acute cystitis with hematuria 01/13/2021   Nocturia 01/13/2021   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/13/2021   Skin lesion 10/12/2018   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 No longer on medicine   Past Surgical History:  Procedure Laterality Date   COLONOSCOPY  last 09/06/2016   CYSTOSCOPY N/A 09/08/2016   Procedure: FIREFLY INJECTIONS;  Surgeon: Belvie LITTIE Clara, MD;  Location: [facility];  Service: Urology;  Laterality: N/A;   CYSTOSCOPY  W/ RETROGRADES Bilateral 11/18/2021   Procedure: CYSTOSCOPY WITH RETROGRADE PYELOGRAM;  Surgeon: Sherrilee Belvie CROME, MD;  Location: [facility];  Service: Urology;  Laterality: Bilateral;   CYSTOSCOPY WITH RETROGRADE PYELOGRAM, URETEROSCOPY AND STENT PLACEMENT Bilateral 12/22/2022   Procedure: CYSTOSCOPY WITH RETROGRADE PYELOGRAM;  Surgeon: Sherrilee Belvie CROME, MD;  Location: [facility];  Service: Urology;  Laterality: Bilateral;   INCISION AND DRAINAGE OF WOUND Right 1970's   leg (04/08/2013)   LIVER BIOPSY  ~ 2011   POLYPECTOMY     PROCTOSCOPY N/A 09/08/2016   Procedure: RIGID PROCTOSCOPY;  Surgeon: Elspeth Schultze, MD;  Location: [facility];  Service: General;  Laterality: N/A;   TONSILLECTOMY     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   Procedure: TRANSURETHRAL RESECTION OF BLADDER TUMOR (TURBT);  Surgeon: Sherrilee Belvie CROME, MD;  Location: [facility];  Service: Urology;  Laterality: N/A;   TRANSURETHRAL RESECTION OF BLADDER TUMOR N/A 12/22/2022   Procedure: TRANSURETHRAL RESECTION OF BLADDER TUMOR (TURBT);  Surgeon: Sherrilee Belvie CROME, MD;  Location: [facility];  Service: Urology;  Laterality: N/A;   URETERAL BIOPSY Bilateral 12/22/2022   Procedure: URETERAL BIOPSY-selective cytologies;  Surgeon: Sherrilee Belvie CROME, MD;  Location: [facility];  Service: Urology;  Laterality: Bilateral;   Allergies[1] Prior to Admission medications  Medication Sig Start Date End Date Taking? Authorizing Provider  Accu-Chek Softclix Lancets lancets Use as instructed 03/21/22  Yes Levora Phillip SAUNDERS, MD  albuterol  (VENTOLIN  HFA) 108 (90 Base) MCG/ACT inhaler TAKE 2 PUFFS BY MOUTH EVERY 6 HOURS AS NEEDED FOR WHEEZE OR SHORTNESS OF BREATH 02/09/24  Yes Levora Phillip SAUNDERS, MD  atorvastatin  (LIPITOR) 10 MG tablet TAKE 1 TABLET BY MOUTH EVERY DAY 01/30/24  Yes Levora Phillip SAUNDERS, MD  blood glucose meter kit and supplies Dispense based on patient and insurance preference. Use up to four times daily as directed. (FOR ICD-10 E10.9, E11.9). 09/29/21  Yes Levora Phillip SAUNDERS, MD  Blood Glucose Monitoring Suppl DEVI 1 each by Does not apply route in the morning, at noon, and at bedtime. May substitute to any manufacturer covered by patient's insurance. 07/19/23  Yes Levora Phillip SAUNDERS, MD  finasteride  (PROSCAR ) 5 MG tablet Take 1 tablet (5 mg total) by mouth daily. 05/10/24  Yes Levora Phillip SAUNDERS, MD  fluticasone  (FLONASE ) 50 MCG/ACT nasal spray Place 2 sprays into both nostrils daily. 02/09/24  Yes Levora Phillip SAUNDERS, MD  Fluticasone -Umeclidin-Vilant (TRELEGY ELLIPTA ) 200-62.5-25 MCG/ACT AEPB Inhale 1 puff into the lungs daily. 03/13/24  Yes Kara Dorn NOVAK, MD   losartan -hydrochlorothiazide  (HYZAAR) 50-12.5 MG tablet TAKE 1 TABLET BY MOUTH EVERY DAY 06/12/24  Yes Levora Phillip SAUNDERS, MD  montelukast  (SINGULAIR ) 10 MG tablet TAKE 1 TABLET BY MOUTH EVERYDAY AT BEDTIME 06/06/24  Yes Levora Phillip SAUNDERS, MD  tadalafil  (CIALIS ) 20 MG tablet Take 1 tablet (20 mg total) by mouth daily as needed. 05/15/24  Yes McKenzie, Belvie CROME, MD  tamsulosin  (FLOMAX ) 0.4 MG CAPS capsule TAKE 2 CAPSULES BY MOUTH AT BEDTIME. 07/07/24  Yes McKenzie, Belvie CROME, MD  triamcinolone  cream (KENALOG ) 0.1 % Apply topically 2 (two) times daily as needed. APPLY TO AFFECTED AREA TWICE A DAY 02/09/24  Yes Levora Phillip SAUNDERS, MD   Social History   Socioeconomic History   Marital status: Married    Spouse name: Not on file   Number of children: 2   Years of education: Not on file   Highest education level: Not on file  Occupational History   Occupation: truck driver  Employer: [employer]    Comment: retired  Tobacco Use   Smoking status: Former    Current packs/day: 0.00    Average packs/day: 0.3 packs/day for 45.0 years (11.3 ttl pk-yrs)    Types: Cigarettes    Start date: 03/05/1976    Quit date: 03/05/2021    Years since quitting: 3.4   Smokeless tobacco: Never  Vaping Use   Vaping status: Never Used  Substance and Sexual Activity   Alcohol use: Yes    Alcohol/week: 12.0 standard drinks of alcohol    Types: 12 Cans of beer per week    Comment: 12 pack a week.   Drug use: No   Sexual activity: Yes  Other Topics Concern   Not on file  Social History Narrative   Pt lives in 1 story home with his wife   Has 2 adult children   Highest level of education: GED & Trade school   Retired engineer, maintenance.    Social Drivers of Health   Tobacco Use: Medium Risk (08/23/2024)   Patient History    Smoking Tobacco Use: Former    Smokeless Tobacco Use: Never    Passive Exposure: Not on file  Financial Resource Strain: Low Risk (08/01/2023)   Overall Financial Resource  Strain (CARDIA)    Difficulty of Paying Living Expenses: Not hard at all  Food Insecurity: No Food Insecurity (08/06/2024)   Epic    Worried About Programme Researcher, Broadcasting/film/video in the Last Year: Never true    Ran Out of Food in the Last Year: Never true  Transportation Needs: No Transportation Needs (08/06/2024)   Epic    Lack of Transportation (Medical): No    Lack of Transportation (Non-Medical): No  Physical Activity: Inactive (08/06/2024)   Exercise Vital Sign    Days of Exercise per Week: 0 days    Minutes of Exercise per Session: 0 min  Stress: No Stress Concern Present (08/06/2024)   [employer] of Occupational Health - Occupational Stress Questionnaire    Feeling of Stress: Not at all  Social Connections: Moderately Isolated (08/06/2024)   Social Connection and Isolation Panel    Frequency of Communication with Friends and Family: Twice a week    Frequency of Social Gatherings with Friends and Family: Twice a week    Attends Religious Services: Never    Database Administrator or Organizations: No    Attends Banker Meetings: Never    Marital Status: Married  Catering Manager Violence: Not At Risk (08/06/2024)   Epic    Fear of Current or Ex-Partner: No    Emotionally Abused: No    Physically Abused: No    Sexually Abused: No  Depression (PHQ2-9): Low Risk (08/23/2024)   Depression (PHQ2-9)    PHQ-2 Score: 0  Alcohol Screen: Low Risk (08/01/2023)   Alcohol Screen    Last Alcohol Screening Score (AUDIT): 0  Housing: Unknown (08/06/2024)   Epic    Unable to Pay for Housing in the Last Year: No    Number of Times Moved in the Last Year: Not on file    Homeless in the Last Year: No  Utilities: Not At Risk (08/06/2024)   Epic    Threatened with loss of utilities: No  Health Literacy: Adequate Health Literacy (08/06/2024)   B1300 Health Literacy    Frequency of need for help with medical instructions: Never    Review of Systems   Objective:   Vitals:  08/23/24 1005  BP: 120/68  Pulse: 60  Resp: 14  Temp: 98.2 F (36.8 C)  TempSrc: Temporal  SpO2: 98%  Weight: 170 lb 3.2 oz (77.2 kg)  Height: 5' 9 (1.753 m)     Physical Exam Vitals reviewed.  Constitutional:      Appearance: He is well-developed.  HENT:     Head: Normocephalic and atraumatic.  Neck:     Vascular: No carotid bruit or JVD.  Cardiovascular:     Rate and Rhythm: Normal rate and regular rhythm.     Heart sounds: Normal heart sounds. No murmur heard. Pulmonary:     Effort: Pulmonary effort is normal.     Breath sounds: Normal breath sounds. No rales.  Musculoskeletal:     Right lower leg: No edema.     Left lower leg: No edema.  Skin:    General: Skin is warm and dry.  Neurological:     Mental Status: He is alert and oriented to person, place, and time.  Psychiatric:        Mood and Affect: Mood normal.        Assessment & Plan:  Phillip Tashiro. is a 84 y.o. male . Essential hypertension - Plan: Comprehensive metabolic panel with GFR  Moderate persistent asthma, unspecified whether complicated - Plan: montelukast  (SINGULAIR ) 10 MG tablet  Cough, unspecified type - Plan: montelukast  (SINGULAIR ) 10 MG tablet  Type 2 diabetes mellitus with hyperglycemia, without long-term current use of insulin (HCC) - Plan: Urine Albumin/Creatinine with ratio (send out) [[id number]], Comprehensive metabolic panel with GFR, Lipid panel, Hemoglobin A1c  Hyperlipidemia, unspecified hyperlipidemia type - Plan: Lipid panel   Meds ordered this encounter  Medications   montelukast  (SINGULAIR ) 10 MG tablet    Sig: Take 1 tablet (10 mg total) by mouth at bedtime.    Dispense:  90 tablet    Refill:  3   Patient Instructions  Please call Dr. Luann office for appointment if you are noticing more wheeze in the evenings. Continue trelegy, singulair  and albuterol  if needed for now. No other med changes at this time.  Thank you for coming in today. No change in  medications at this time. If there are any concerns on your bloodwork, I will let you know. Take care!  Return to the clinic or go to the nearest emergency room if any of your symptoms worsen or new symptoms occur.      Signed,   Phillip Pines, MD [facility], [facility] 08/23/2024 10:30 AM      [1]  Allergies Allergen Reactions   Penicillins Anaphylaxis and Other (See Comments)    Has patient had a PCN reaction causing immediate rash, facial/tongue/throat swelling, SOB or lightheadedness with hypotension: Yes Has patient had a PCN reaction causing severe rash involving mucus membranes or skin necrosis: No Has patient had a PCN reaction that required hospitalization No Has patient had a PCN reaction occurring within the last 10 years: No If all of the above answers are NO, then may proceed with Cephalosporin use.   Shellfish Allergy Anaphylaxis    Shrimp  crabs   "

## 2024-08-23 NOTE — Patient Instructions (Addendum)
 Please call Dr. Luann office for appointment if you are noticing more wheeze in the evenings. Continue trelegy, singulair  and albuterol  if needed for now. No other med changes at this time.  Thank you for coming in today. No change in medications at this time. If there are any concerns on your bloodwork, I will let you know. Take care!  Return to the clinic or go to the nearest emergency room if any of your symptoms worsen or new symptoms occur.

## 2024-08-25 ENCOUNTER — Ambulatory Visit: Payer: Self-pay | Admitting: Family Medicine

## 2024-08-30 ENCOUNTER — Ambulatory Visit: Admitting: Urology

## 2024-08-30 ENCOUNTER — Encounter: Payer: Self-pay | Admitting: Urology

## 2024-08-30 VITALS — BP 136/64 | HR 67

## 2024-08-30 DIAGNOSIS — Z08 Encounter for follow-up examination after completed treatment for malignant neoplasm: Secondary | ICD-10-CM

## 2024-08-30 DIAGNOSIS — Z8551 Personal history of malignant neoplasm of bladder: Secondary | ICD-10-CM | POA: Diagnosis not present

## 2024-08-30 DIAGNOSIS — C679 Malignant neoplasm of bladder, unspecified: Secondary | ICD-10-CM

## 2024-08-30 LAB — URINALYSIS, ROUTINE W REFLEX MICROSCOPIC
Bilirubin, UA: NEGATIVE
Glucose, UA: NEGATIVE
Ketones, UA: NEGATIVE
Leukocytes,UA: NEGATIVE
Nitrite, UA: NEGATIVE
Protein,UA: NEGATIVE
RBC, UA: NEGATIVE
Specific Gravity, UA: 1.01 (ref 1.005–1.030)
Urobilinogen, Ur: 0.2 mg/dL (ref 0.2–1.0)
pH, UA: 6 (ref 5.0–7.5)

## 2024-08-30 MED ORDER — CIPROFLOXACIN HCL 500 MG PO TABS
500.0000 mg | ORAL_TABLET | Freq: Once | ORAL | Status: AC
  Administered 2024-08-30: 500 mg via ORAL

## 2024-08-30 NOTE — Progress Notes (Signed)
" ° °  08/30/24  CC: followup bladder cancer   HPI: Mr Nuzum is a 83yo here for cystoscopy for history of bladder cancer Blood pressure 136/64, pulse 67. NED. A&Ox3.   No respiratory distress   Abd soft, NT, ND Normal phallus with bilateral descended testicles  Cystoscopy Procedure Note  Patient identification was confirmed, informed consent was obtained, and patient was prepped using Betadine solution.  Lidocaine  jelly was administered per urethral meatus.     Pre-Procedure: - Inspection reveals a normal caliber ureteral meatus.  Procedure: The flexible cystoscope was introduced without difficulty - No urethral strictures/lesions are present. - Enlarged prostate  - Normal bladder neck - Bilateral ureteral orifices identified - Bladder mucosa  reveals no ulcers, tumors, or lesions - No bladder stones - No trabeculation    Post-Procedure: - Patient tolerated the procedure well  Assessment/ Plan: Followup 3 months for cystoscopy  No follow-ups on file.  Belvie Clara, MD  "

## 2024-08-30 NOTE — Patient Instructions (Signed)

## 2024-09-03 NOTE — Addendum Note (Signed)
 Addended by: Eulia Hatcher L on: 09/03/2024 08:16 AM   Modules accepted: Level of Service

## 2024-09-24 ENCOUNTER — Ambulatory Visit: Admitting: Physician Assistant

## 2024-11-29 ENCOUNTER — Other Ambulatory Visit: Admitting: Urology
